# Patient Record
Sex: Male | Born: 1990 | Race: Black or African American | Hispanic: No | Marital: Single | Smoking: Former smoker
Health system: Southern US, Community
[De-identification: ages and names within clinical notes are randomized; demographics above are authoritative.]

## PROBLEM LIST (undated history)

## (undated) DIAGNOSIS — K219 Gastro-esophageal reflux disease without esophagitis: Secondary | ICD-10-CM

## (undated) DIAGNOSIS — M810 Age-related osteoporosis without current pathological fracture: Secondary | ICD-10-CM

## (undated) DIAGNOSIS — R011 Cardiac murmur, unspecified: Secondary | ICD-10-CM

## (undated) HISTORY — DX: Gastro-esophageal reflux disease without esophagitis: K21.9

## (undated) HISTORY — DX: Age-related osteoporosis without current pathological fracture: M81.0

## (undated) HISTORY — DX: Cardiac murmur, unspecified: R01.1

---
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
## 2009-05-23 ENCOUNTER — Emergency Department (HOSPITAL_COMMUNITY): Admission: EM | Admit: 2009-05-23 | Discharge: 2009-05-26 | Payer: Self-pay | Admitting: Emergency Medicine

## 2010-05-02 ENCOUNTER — Emergency Department (HOSPITAL_COMMUNITY): Admission: EM | Admit: 2010-05-02 | Discharge: 2010-05-02 | Payer: Self-pay | Admitting: Emergency Medicine

## 2010-11-26 ENCOUNTER — Emergency Department (HOSPITAL_COMMUNITY)
Admission: EM | Admit: 2010-11-26 | Discharge: 2010-11-26 | Payer: Self-pay | Source: Home / Self Care | Admitting: Emergency Medicine

## 2011-03-06 LAB — DIFFERENTIAL
Eosinophils Absolute: 0 10*3/uL (ref 0.0–0.7)
Eosinophils Relative: 0 % (ref 0–5)
Lymphocytes Relative: 12 % (ref 12–46)
Monocytes Absolute: 0.4 10*3/uL (ref 0.1–1.0)

## 2011-03-06 LAB — COMPREHENSIVE METABOLIC PANEL
ALT: 15 U/L (ref 0–53)
AST: 19 U/L (ref 0–37)
Albumin: 4.5 g/dL (ref 3.5–5.2)
BUN: 13 mg/dL (ref 6–23)
Calcium: 9.7 mg/dL (ref 8.4–10.5)
Chloride: 105 mEq/L (ref 96–112)
Creatinine, Ser: 1.16 mg/dL (ref 0.4–1.5)
GFR calc Af Amer: >60 mL/min (ref >60)
GFR calc non Af Amer: >60 mL/min (ref >60)
Total Protein: 7.8 g/dL (ref 6.0–8.3)

## 2011-03-06 LAB — RAPID URINE DRUG SCREEN, HOSP PERFORMED
Amphetamines: NOT DETECTED
Benzodiazepines: NOT DETECTED
Cocaine: NOT DETECTED
Opiates: NOT DETECTED
Tetrahydrocannabinol: POSITIVE — AB

## 2011-03-06 LAB — CBC
HCT: 44.1 % (ref 39.0–52.0)
MCV: 75.9 fL — ABNORMAL LOW (ref 78.0–100.0)
Platelets: 245 10*3/uL (ref 150–400)
RBC: 5.81 MIL/uL (ref 4.22–5.81)

## 2011-04-14 NOTE — Consult Note (Signed)
[name], IM             ACCOUNT NO.:  [id number]   MEDICAL RECORD NO.:  [id number]          PATIENT TYPE:  EMS   LOCATION:  ED                           FACILITY:  [facility]   PHYSICIAN:  Antonietta Breach, M.D.  DATE OF BIRTH:  1991-11-11   DATE OF CONSULTATION:  05/30/2009  DATE OF DISCHARGE:  05/26/2009                                 CONSULTATION   REASON FOR CONSULTATION:  Psychosis.   REQUESTING PHYSICIAN:  [facility] emergency physician.   HISTORY OF PRESENT ILLNESS:  [name] is an 20 year old male  presenting to the [facility] with auditory hallucinations.  He  has been having auditory hallucinations telling him to do bad things.  He also has had homicidal thoughts towards his son and his mother.  He  has had suicidal thoughts as well.   PAST PSYCHIATRIC HISTORY:  He has been experiencing episodes of  psychotic symptoms since he was very young.   FAMILY PSYCHIATRIC HISTORY:  None known.   SOCIAL HISTORY:  He does have a newborn son. He is single.  He is not  drinking alcohol.   PAST MEDICAL HISTORY:  Usual childhood illnesses.   ALLERGIES:  NO KNOWN DRUG ALLERGIES.   MEDICATIONS:  He has not been having any home medications.   While in the emergency room he has required Ativan for agitation.  Specifically he has required 6 mg of Ativan over the past 2 days.   LABORATORY DATA:  Urine drug screen positive for tetrahydrocannabinol.   Alcohol level negative.   Sodium 141, BUN 13, creatinine 1.16, glucose 84, SGOT 19, SGPT 15.   WBC 7.6, hemoglobin 14, platelet count 245.   REVIEW OF SYSTEMS:  Constitutional, head, eyes, ears, nose and throat,  mouth, neurologic, psychiatric, cardiovascular, respiratory,  gastrointestinal, genitourinary, skin, musculoskeletal, hematologic,  lymphatic, endocrine, and metabolic are all unremarkable.   PHYSICAL EXAMINATION:  VITAL SIGNS:  Temperature 98.4, pulse 57,  respiratory rate 20, blood pressure  117/69.  GENERAL APPEARANCE:  [name] is a young male lying in a supine  position in his hospital bed with no abnormal involuntary movements.   MENTAL STATUS EXAM:  [name] is alert.  His eye contact is  intermittent.  His attention span is mildly decreased.  His affect is  anxious. Mood is anxious.  Concentration mildly decreased.  He is  oriented to all spheres.  His memory is grossly intact.  His fund of  knowledge and intelligence are grossly within normal limits.  His speech  is soft.  There is no dysarthria.  Thought process is coherent. Thought  content, please see the history of present illness.  Insight is poor.  Judgment is impaired.   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   [name] does require psychiatric hospitalization given the severity  of his psychosis and the potential risk of harming himself and others.   Regarding his pharmacotherapy, recommend starting Seroquel at 50 mg p.o.  q.h.s. Would then titrate by 50 mg per day as tolerated to an estimated  trial dose of 300 mg daily.  Would monitor for stiffness or other extrapyramidal side effects.   Low stimulation ego support.   Admit to an inpatient psychiatric unit as soon as possible.      Antonietta Breach, M.D.  Electronically Signed     JW/MEDQ  D:  05/29/2009  T:  05/30/2009  Job:  [id number]

## 2011-10-02 ENCOUNTER — Encounter: Payer: Self-pay | Admitting: Emergency Medicine

## 2011-10-02 ENCOUNTER — Emergency Department (HOSPITAL_COMMUNITY)
Admission: EM | Admit: 2011-10-02 | Discharge: 2011-10-02 | Disposition: A | Discharge status: Home / Self Care | Payer: Self-pay | Attending: Emergency Medicine | Admitting: Emergency Medicine

## 2011-10-02 ENCOUNTER — Emergency Department (HOSPITAL_COMMUNITY)
Admission: EM | Admit: 2011-10-02 | Discharge: 2011-10-02 | Discharge status: Absent without leave | Payer: Self-pay | Source: Home / Self Care

## 2011-10-02 DIAGNOSIS — S81009A Unspecified open wound, unspecified knee, initial encounter: Secondary | ICD-10-CM | POA: Insufficient documentation

## 2011-10-02 DIAGNOSIS — S91009A Unspecified open wound, unspecified ankle, initial encounter: Secondary | ICD-10-CM | POA: Insufficient documentation

## 2011-10-02 DIAGNOSIS — F17200 Nicotine dependence, unspecified, uncomplicated: Secondary | ICD-10-CM | POA: Insufficient documentation

## 2011-10-02 DIAGNOSIS — W540XXA Bitten by dog, initial encounter: Secondary | ICD-10-CM | POA: Insufficient documentation

## 2011-10-02 DIAGNOSIS — S81859A Open bite, unspecified lower leg, initial encounter: Secondary | ICD-10-CM

## 2011-10-02 MED ORDER — TETANUS-DIPHTH-ACELL PERTUSSIS 5-2.5-18.5 LF-MCG/0.5 IM SUSP: 0.5000 mL | Freq: Once | INTRAMUSCULAR | Status: DC

## 2011-10-02 NOTE — ED Provider Notes (Signed)
History     CSN: [id number] Arrival date & time: 10/02/2011  3:30 PM   First MD Initiated Contact with Patient 10/02/11 1616      Chief Complaint  Patient presents with  . Animal Bite    (Consider location/radiation/quality/duration/timing/severity/associated sxs/prior treatment) HPI This 20 year old male was bitten on the right posterior calf 3 days ago at his girlfriend's dog which is a typical and was acting aggressively, animal control was notified and took the dog, the patient was notified today that animal control had euthanized the dog. The patient has continued pain to the area of his right lower leg were the 2 puncture marks are located but there is no redness or pus or swelling to the area. He also has no new numbness or obvious weakness to the right leg.  He has no fever dizziness chest pain shortness of breath or other concerns. He thinks he might need a tetanus shot. The pain is sharp and localized to the right lower leg without radiation or associated symptoms. History reviewed. No pertinent past medical history.  History reviewed. No pertinent past surgical history.  History reviewed. No pertinent family history.  History  Substance Use Topics  . Smoking status: Current Everyday Smoker -- 0.5 packs/day for 5 years  . Smokeless tobacco: Not on file  . Alcohol Use: Yes      Review of Systems  Constitutional: Negative for fever.       10 Systems reviewed and are negative for acute change except as noted in the HPI.  HENT: Negative for congestion.   Eyes: Negative for discharge and redness.  Respiratory: Negative for cough and shortness of breath.   Cardiovascular: Negative for chest pain.  Gastrointestinal: Negative for vomiting and abdominal pain.  Musculoskeletal: Negative for back pain.  Skin: Negative for rash.  Neurological: Negative for syncope, numbness and headaches.  Psychiatric/Behavioral:       No behavior change.    Allergies  Review of patient's  allergies indicates no known allergies.  Home Medications  No current outpatient prescriptions on file.  BP 126/72  Pulse 68  Temp(Src) 98 F (36.7 C) (Oral)  Resp 16  SpO2 96%  Physical Exam  Nursing note and vitals reviewed. Constitutional:       Awake, alert, nontoxic appearance.  HENT:  Head: Atraumatic.  Eyes: Right eye exhibits no discharge. Left eye exhibits no discharge.  Neck: Neck supple.  Cardiovascular: Normal rate and regular rhythm.   No murmur heard. Pulmonary/Chest: Effort normal and breath sounds normal. No respiratory distress. He has no wheezes. He has no rales. He exhibits no tenderness.  Abdominal: Soft. Bowel sounds are normal. There is no tenderness. There is no rebound and no guarding.  Musculoskeletal: Normal range of motion. He exhibits tenderness. He exhibits no edema.       Baseline ROM, no obvious new focal weakness.  Both arms and left leg are totally nontender. The right leg is nontender to the thigh ankle and foot with dorsalis pedis pulse intact capillary refill less than 2 seconds normal light touch and good range of motion to the foot and right leg with good strength to the right leg. The right lower leg in the calf area proximally two scabbed over puncture wounds which are tender locally to palpation without erythema fluctuance purulent drainage foul odor or crepitus.  There is no swelling to the lower leg and the lower leg is soft without suspicion of compartment syndrome at this time.  Neurological:  Mental status and motor strength appears baseline for patient and situation.  Skin: No rash noted.  Psychiatric: He has a normal mood and affect.    ED Course  Procedures (including critical care time) Nursing spoke with animal control and animal control states they are quarantined the animal and have not euthanized the animal yet. As expected animal control recommends no rabies prophylaxis at this time. At this time I doubt localized  infection, compartment syndrome, or other condition requiring further dilation at this time today. Labs Reviewed - No data to display No results found.   1. Dog bite of calf       MDM  Patient / Family / Caregiver informed of clinical course, understand medical decision-making process, and agree with plan.        Hurman Horn, MD 10/04/11 2233

## 2011-10-02 NOTE — ED Notes (Signed)
Pt here after being bite by dog on Friday; pt sts animal control has animal; pt c/o pain a wound site; 2 puncture wounds noted to back of right calf

## 2011-12-14 ENCOUNTER — Emergency Department (HOSPITAL_COMMUNITY): Payer: Self-pay

## 2011-12-14 ENCOUNTER — Emergency Department (HOSPITAL_COMMUNITY)
Admission: EM | Admit: 2011-12-14 | Discharge: 2011-12-15 | Disposition: A | Discharge status: Home / Self Care | Payer: Self-pay | Attending: Emergency Medicine | Admitting: Emergency Medicine

## 2011-12-14 ENCOUNTER — Encounter (HOSPITAL_COMMUNITY): Payer: Self-pay | Admitting: Emergency Medicine

## 2011-12-14 DIAGNOSIS — M5380 Other specified dorsopathies, site unspecified: Secondary | ICD-10-CM | POA: Insufficient documentation

## 2011-12-14 DIAGNOSIS — F17200 Nicotine dependence, unspecified, uncomplicated: Secondary | ICD-10-CM | POA: Insufficient documentation

## 2011-12-14 DIAGNOSIS — M549 Dorsalgia, unspecified: Secondary | ICD-10-CM | POA: Insufficient documentation

## 2011-12-14 LAB — URINALYSIS, ROUTINE W REFLEX MICROSCOPIC
Bilirubin Urine: NEGATIVE
Glucose, UA: NEGATIVE mg/dL
Leukocytes, UA: NEGATIVE
Nitrite: NEGATIVE
Urobilinogen, UA: 0.2 mg/dL (ref 0.0–1.0)

## 2011-12-14 MED ORDER — IBUPROFEN 800 MG PO TABS: 800.0000 mg | ORAL_TABLET | Freq: Three times a day (TID) | ORAL | Status: AC

## 2011-12-14 MED ORDER — IBUPROFEN 800 MG PO TABS: 800.0000 mg | ORAL_TABLET | Freq: Three times a day (TID) | ORAL | Status: DC

## 2011-12-14 MED ORDER — HYDROMORPHONE HCL PF 1 MG/ML IJ SOLN
1.0000 mg | Freq: Once | INTRAMUSCULAR | Status: AC
  Administered 2011-12-14: 1 mg via INTRAMUSCULAR
  Filled 2011-12-14: qty 1

## 2011-12-14 MED ORDER — DIAZEPAM 5 MG PO TABS: 5.0000 mg | ORAL_TABLET | Freq: Three times a day (TID) | ORAL | Status: AC | PRN

## 2011-12-14 MED ORDER — DIAZEPAM 5 MG PO TABS
10.0000 mg | ORAL_TABLET | Freq: Once | ORAL | Status: AC
  Administered 2011-12-14: 10 mg via ORAL
  Filled 2011-12-14: qty 2

## 2011-12-14 NOTE — ED Notes (Signed)
Pt reports left mid back pain that radiates to rt - pt states pain began acutely this a.m. - pt denies any mechanism of injury or hematuria. Pt w/o any n/v/d or fever.

## 2011-12-14 NOTE — ED Notes (Signed)
Per EMS pt c/o lower back pain, started in groin and moved to lower back. Pt denies injury.

## 2011-12-14 NOTE — ED Provider Notes (Signed)
History     CSN: [id number]  Arrival date & time 12/14/11  1955   First MD Initiated Contact with Patient 12/14/11 2104      Chief Complaint  Patient presents with  . Back Pain    (Consider location/radiation/quality/duration/timing/severity/associated sxs/prior treatment) Patient is a 21 y.o. male presenting with back pain. The history is provided by the patient.  Back Pain  This is a new problem. Episode onset: just prior to arrival. The problem occurs constantly. The problem has not changed since onset.The pain is associated with no known injury (began while standing). The pain is present in the thoracic spine and lumbar spine. The quality of the pain is described as cramping and stabbing. The pain does not radiate. The pain is severe. The symptoms are aggravated by bending and twisting. The pain is the same all the time. Pertinent negatives include no chest pain, no fever, no numbness, no headaches, no abdominal pain, no abdominal swelling, no bowel incontinence, no perianal numbness, no bladder incontinence, no dysuria, no pelvic pain, no leg pain, no paresthesias, no paresis, no tingling and no weakness. He has tried nothing for the symptoms.  Although pt reports pain initially in testicles, denies current pain in groin or testicles.  History reviewed. No pertinent past medical history.  History reviewed. No pertinent past surgical history.  No family history on file.  History  Substance Use Topics  . Smoking status: Current Everyday Smoker -- 0.5 packs/day for 5 years  . Smokeless tobacco: Not on file  . Alcohol Use: Yes      Review of Systems  Constitutional: Negative for fever and chills.  Cardiovascular: Negative for chest pain.  Gastrointestinal: Negative for abdominal pain and bowel incontinence.  Genitourinary: Negative for bladder incontinence, dysuria and pelvic pain.  Musculoskeletal: Positive for back pain.  Neurological: Negative for tingling, weakness,  numbness, headaches and paresthesias.  All other systems reviewed and are negative.    Allergies  Review of patient's allergies indicates no known allergies.  Home Medications   Current Outpatient Rx  Name Route Sig Dispense Refill  . ASPIRIN 325 MG PO TABS Oral Take 325 mg by mouth daily. Took 2 tablets      BP 109/47  Pulse 64  Temp(Src) 98.8 F (37.1 C) (Oral)  Resp 20  Wt 140 lb (63.504 kg)  SpO2 97%  Physical Exam  Nursing note and vitals reviewed. Constitutional: He is oriented to person, place, and time. He appears well-developed and well-nourished. He appears distressed.  HENT:  Head: Normocephalic and atraumatic.  Right Ear: External ear normal.  Left Ear: External ear normal.  Eyes: Conjunctivae and EOM are normal. Pupils are equal, round, and reactive to light.  Neck: Normal range of motion. Neck supple.  Cardiovascular: Normal rate, regular rhythm, normal heart sounds and intact distal pulses.   No murmur heard. Pulmonary/Chest: Effort normal and breath sounds normal. No respiratory distress. He has no wheezes. He exhibits no tenderness.  Abdominal: Soft. Bowel sounds are normal. He exhibits no distension. There is no tenderness. Hernia confirmed negative in the right inguinal area and confirmed negative in the left inguinal area.  Genitourinary: Penis normal. Right testis shows no mass, no swelling and no tenderness. Left testis shows no mass, no swelling and no tenderness. Circumcised. No penile erythema or penile tenderness. No discharge found.  Musculoskeletal: He exhibits no edema.       Cervical back: He exhibits normal range of motion, no tenderness, no bony tenderness, no deformity  and no pain.       Thoracic back: He exhibits bony tenderness and spasm. He exhibits normal range of motion and no deformity.       Lumbar back: He exhibits spasm. He exhibits normal range of motion, no bony tenderness and no deformity.  Lymphadenopathy:    He has no cervical  adenopathy.       Right: No inguinal adenopathy present.       Left: No inguinal adenopathy present.  Neurological: He is alert and oriented to person, place, and time. No cranial nerve deficit. Coordination normal.       Sensation intact to light touch  Skin: Skin is warm and dry. No rash noted. No erythema.    ED Course  Procedures (including critical care time)  Labs Reviewed  URINALYSIS, ROUTINE W REFLEX MICROSCOPIC - Abnormal; Notable for the following:    Ketones, ur TRACE (*)    All other components within normal limits   Dg Thoracic Spine 2 View  12/14/2011  *RADIOLOGY REPORT*  Clinical Data: Mid back pain.  THORACIC SPINE - 2 VIEW  Comparison: Chest radiograph performed 12/16/2006  Findings: There is no evidence of fracture or subluxation. Vertebral bodies demonstrate normal height and alignment. Intervertebral disc spaces are preserved.  The visualized portions of both lungs are clear.  The mediastinum is unremarkable in appearance.  IMPRESSION: No evidence of fracture or subluxation along the thoracic spine.  Original Report Authenticated By: Tonia Ghent, M.D.     Dx 1: Back pain   MDM  Acute spasm of thoracic and lumbar regions of back with bony TTP to thoracic spine, x-ray reviewed with no evidence of fx or subluxation. Complete resolution of symptoms after 1mg  IM dilaudid and 10mg  PO valium. Pt to be d/c home.        Elwyn Reach [place], [place] 12/14/11 2356

## 2011-12-14 NOTE — ED Notes (Signed)
Pt alert, nad, c/o low back pain, onset this evening, denies trauma or injury, ambulates with assistance, resp even unlabored, denies changes in bowel or bladder

## 2011-12-15 MED ORDER — ONDANSETRON 8 MG PO TBDP
8.0000 mg | ORAL_TABLET | Freq: Once | ORAL | Status: AC
  Administered 2011-12-15: 8 mg via ORAL

## 2011-12-15 MED ORDER — ONDANSETRON 8 MG PO TBDP
ORAL_TABLET | ORAL | Status: AC
  Filled 2011-12-15: qty 1

## 2011-12-15 MED ORDER — ONDANSETRON 4 MG PO TBDP
ORAL_TABLET | ORAL | Status: AC
  Filled 2011-12-15: qty 1

## 2011-12-15 NOTE — ED Notes (Signed)
Rx given x2 D/c instructions reviewed w/ pt and family - pt and family deny any further questions or concerns at present.  

## 2011-12-18 NOTE — ED Provider Notes (Signed)
Medical screening examination/treatment/procedure(s) were performed by non-physician practitioner and as supervising physician I was immediately available for consultation/collaboration.  Shateria Paternostro R. Lyndie Vanderloop, MD 12/18/11 1452 

## 2012-09-07 ENCOUNTER — Emergency Department (HOSPITAL_COMMUNITY)
Admission: EM | Admit: 2012-09-07 | Discharge: 2012-09-07 | Disposition: A | Discharge status: Home / Self Care | Payer: Self-pay | Attending: Emergency Medicine | Admitting: Emergency Medicine

## 2012-09-07 ENCOUNTER — Encounter (HOSPITAL_COMMUNITY): Payer: Self-pay | Admitting: *Deleted

## 2012-09-07 DIAGNOSIS — K029 Dental caries, unspecified: Secondary | ICD-10-CM | POA: Insufficient documentation

## 2012-09-07 DIAGNOSIS — K0530 Chronic periodontitis, unspecified: Secondary | ICD-10-CM | POA: Insufficient documentation

## 2012-09-07 DIAGNOSIS — K047 Periapical abscess without sinus: Secondary | ICD-10-CM | POA: Insufficient documentation

## 2012-09-07 DIAGNOSIS — F17200 Nicotine dependence, unspecified, uncomplicated: Secondary | ICD-10-CM | POA: Insufficient documentation

## 2012-09-07 DIAGNOSIS — K0510 Chronic gingivitis, plaque induced: Secondary | ICD-10-CM | POA: Insufficient documentation

## 2012-09-07 MED ORDER — METRONIDAZOLE 500 MG PO TABS
500.0000 mg | ORAL_TABLET | Freq: Once | ORAL | Status: AC
  Administered 2012-09-07: 500 mg via ORAL
  Filled 2012-09-07: qty 1

## 2012-09-07 MED ORDER — HYDROCODONE-ACETAMINOPHEN 5-325 MG PO TABS
2.0000 | ORAL_TABLET | Freq: Once | ORAL | Status: AC
  Administered 2012-09-07: 2 via ORAL
  Filled 2012-09-07: qty 2

## 2012-09-07 MED ORDER — IBUPROFEN 400 MG PO TABS
600.0000 mg | ORAL_TABLET | Freq: Once | ORAL | Status: AC
  Administered 2012-09-07: 600 mg via ORAL
  Filled 2012-09-07: qty 1

## 2012-09-07 MED ORDER — IBUPROFEN 600 MG PO TABS: 600.0000 mg | ORAL_TABLET | Freq: Four times a day (QID) | ORAL | Status: DC | PRN

## 2012-09-07 MED ORDER — METRONIDAZOLE 500 MG PO TABS: 500.0000 mg | ORAL_TABLET | Freq: Two times a day (BID) | ORAL | Status: DC

## 2012-09-07 MED ORDER — PENICILLIN V POTASSIUM 500 MG PO TABS: 500.0000 mg | ORAL_TABLET | Freq: Four times a day (QID) | ORAL | Status: AC

## 2012-09-07 MED ORDER — PENICILLIN V POTASSIUM 250 MG PO TABS
250.0000 mg | ORAL_TABLET | Freq: Once | ORAL | Status: AC
  Administered 2012-09-07: 250 mg via ORAL
  Filled 2012-09-07: qty 1

## 2012-09-07 MED ORDER — HYDROCODONE-ACETAMINOPHEN 5-325 MG PO TABS: 1.0000 | ORAL_TABLET | Freq: Four times a day (QID) | ORAL | Status: DC | PRN

## 2012-09-07 NOTE — ED Notes (Signed)
Patient woke with swelling in his face/jaw on the left side.  He states he has some pain on yesterday.  Patient states the swelling is causing him diff swallowing and breathing due to swelling

## 2012-09-07 NOTE — ED Provider Notes (Addendum)
History    This chart was scribed for Derwood Kaplan, MD, MD by Smitty Pluck. The patient was seen in room TR11C and the patient's care was started at 11:31AM.   CSN: [id number]  Arrival date & time 09/07/12  1000      Chief Complaint  Patient presents with  . Facial Swelling    (Consider location/radiation/quality/duration/timing/severity/associated sxs/prior treatment) The history is provided by the patient. No language interpreter was used.   [name] is a 21 y.o. male who presents to the Emergency Department complaining of moderate left jaw swelling onset today and constant, moderate left jaw pain onset 3 days ago. Pt thinks that he has abscess. Reports pain started in lower left tooth and spread to jaw. He reports that pain is aggravated by chewing. Reports that he has chills. Denies nausea, vomiting, fever and any other pain.   History reviewed. No pertinent past medical history.  History reviewed. No pertinent past surgical history.  No family history on file.  History  Substance Use Topics  . Smoking status: Current Every Day Smoker -- 0.5 packs/day for 5 years  . Smokeless tobacco: Not on file  . Alcohol Use: Yes      Review of Systems  Constitutional: Positive for chills.  HENT: Positive for dental problem.   All other systems reviewed and are negative.    Allergies  Review of patient's allergies indicates no known allergies.  Home Medications  No current outpatient prescriptions on file.  BP 127/73  Pulse 74  Temp 97.3 F (36.3 C) (Oral)  Resp 20  Ht 5\' 11"  (1.803 m)  Wt 167 lb (75.751 kg)  BMI 23.29 kg/m2  SpO2 100%  Physical Exam  Nursing note and vitals reviewed. Constitutional: He is oriented to person, place, and time. He appears well-developed and well-nourished.  HENT:  Head: Normocephalic and atraumatic.       Base of tongue and mouth are nl No swelling  No erythema Lateral tongue is nl Tooth 1-8 no pain with  palpation Tooth 16-32 no pain with palpation All other teeth have tenderness Tooth 18 has erosion of enamel  There appears to be yellow discharge No visible pulp exposure  Gingival swelling mostly on mucosal surface on left lower side No tenderness or swelling of left upper teeth  Negative trismus  External left face has mild swelling no erythema Left face has moderate tenderness with palpation no fluctuance nor crepitus   Neck: Normal range of motion. Neck supple.  Cardiovascular: Normal rate, regular rhythm and normal heart sounds.   No murmur heard. Pulmonary/Chest: Effort normal and breath sounds normal. No respiratory distress. He has no wheezes. He has no rales.  Neurological: He is alert and oriented to person, place, and time.  Skin: Skin is warm and dry.  Psychiatric: He has a normal mood and affect. His behavior is normal.    ED Course  Procedures (including critical care time) DIAGNOSTIC STUDIES: Oxygen Saturation is 100% on room air, normal by my interpretation.    COORDINATION OF CARE: 11:39 AM Discussed ED treatment with pt     Labs Reviewed - No data to display No results found.   No diagnosis found.    MDM  Medical screening examination/treatment/procedure(s) were performed by me as the supervising physician. Scribe service was utilized for documentation only.  \DDx includes: - Periapical tooth infection - Dental abscess - Gingivitis - Dental trauma - Pulpitis - Nerve root compression  Pt comes in with what appears  to be periodontitis. He has no insurance, and states wont be able to afford Augmentin, so we will d.c him w. Pen v k and flagyl. No systemic signs, no evidence of abscess, and no comorbidities.     Derwood Kaplan, MD 09/07/12 1610  Derwood Kaplan, MD 09/07/12 1217

## 2012-11-12 ENCOUNTER — Encounter (HOSPITAL_COMMUNITY): Payer: Self-pay | Admitting: *Deleted

## 2012-11-12 ENCOUNTER — Emergency Department (HOSPITAL_COMMUNITY)
Admission: EM | Admit: 2012-11-12 | Discharge: 2012-11-12 | Disposition: A | Discharge status: Home / Self Care | Payer: Self-pay | Attending: Emergency Medicine | Admitting: Emergency Medicine

## 2012-11-12 ENCOUNTER — Emergency Department (HOSPITAL_COMMUNITY): Payer: Self-pay

## 2012-11-12 DIAGNOSIS — S60221A Contusion of right hand, initial encounter: Secondary | ICD-10-CM

## 2012-11-12 DIAGNOSIS — Y929 Unspecified place or not applicable: Secondary | ICD-10-CM | POA: Insufficient documentation

## 2012-11-12 DIAGNOSIS — S60229A Contusion of unspecified hand, initial encounter: Secondary | ICD-10-CM | POA: Insufficient documentation

## 2012-11-12 DIAGNOSIS — F17200 Nicotine dependence, unspecified, uncomplicated: Secondary | ICD-10-CM | POA: Insufficient documentation

## 2012-11-12 DIAGNOSIS — IMO0002 Reserved for concepts with insufficient information to code with codable children: Secondary | ICD-10-CM | POA: Insufficient documentation

## 2012-11-12 DIAGNOSIS — Y9389 Activity, other specified: Secondary | ICD-10-CM | POA: Insufficient documentation

## 2012-11-12 DIAGNOSIS — R209 Unspecified disturbances of skin sensation: Secondary | ICD-10-CM | POA: Insufficient documentation

## 2012-11-12 DIAGNOSIS — Z23 Encounter for immunization: Secondary | ICD-10-CM | POA: Insufficient documentation

## 2012-11-12 DIAGNOSIS — S51839A Puncture wound without foreign body of unspecified forearm, initial encounter: Secondary | ICD-10-CM

## 2012-11-12 DIAGNOSIS — S51809A Unspecified open wound of unspecified forearm, initial encounter: Secondary | ICD-10-CM | POA: Insufficient documentation

## 2012-11-12 DIAGNOSIS — R0789 Other chest pain: Secondary | ICD-10-CM | POA: Insufficient documentation

## 2012-11-12 MED ORDER — HYDROCODONE-ACETAMINOPHEN 5-325 MG PO TABS
1.0000 | ORAL_TABLET | Freq: Once | ORAL | Status: AC
  Administered 2012-11-12: 1 via ORAL
  Filled 2012-11-12: qty 1

## 2012-11-12 MED ORDER — TETANUS-DIPHTH-ACELL PERTUSSIS 5-2.5-18.5 LF-MCG/0.5 IM SUSP
0.5000 mL | Freq: Once | INTRAMUSCULAR | Status: AC
  Administered 2012-11-12: 0.5 mL via INTRAMUSCULAR
  Filled 2012-11-12: qty 0.5
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
## 2012-11-12 NOTE — ED Provider Notes (Signed)
Medical screening examination/treatment/procedure(s) were performed by non-physician practitioner and as supervising physician I was immediately available for consultation/collaboration. Devoria Albe, MD, Armando Gang   Ward Givens, MD 11/12/12 [phone]

## 2012-11-12 NOTE — Progress Notes (Signed)
Orthopedic Tech Progress Note Patient Details:  [name] March 22, 1991 [id number]  Ortho Devices Type of Ortho Device: Velcro wrist splint Ortho Device/Splint Location: (R) UE Ortho Device/Splint Interventions: Application   Jennye Moccasin 11/12/2012, 10:46 PM

## 2013-12-06 ENCOUNTER — Emergency Department: Payer: Self-pay | Admitting: Emergency Medicine

## 2018-11-25 ENCOUNTER — Other Ambulatory Visit: Payer: Self-pay

## 2018-11-25 ENCOUNTER — Encounter (HOSPITAL_COMMUNITY): Payer: Self-pay | Admitting: Emergency Medicine

## 2018-11-25 ENCOUNTER — Emergency Department (HOSPITAL_COMMUNITY)
Admission: EM | Admit: 2018-11-25 | Discharge: 2018-11-25 | Disposition: A | Discharge status: Home / Self Care | Payer: Self-pay | Attending: Emergency Medicine | Admitting: Emergency Medicine

## 2018-11-25 DIAGNOSIS — F17210 Nicotine dependence, cigarettes, uncomplicated: Secondary | ICD-10-CM | POA: Insufficient documentation

## 2018-11-25 DIAGNOSIS — J020 Streptococcal pharyngitis: Secondary | ICD-10-CM | POA: Insufficient documentation

## 2018-11-25 LAB — GROUP A STREP BY PCR: GROUP A STREP BY PCR: DETECTED — AB

## 2018-11-25 MED ORDER — PENICILLIN G BENZATHINE & PROC 1200000 UNIT/2ML IM SUSP
1.2000 10*6.[IU] | Freq: Once | INTRAMUSCULAR | Status: DC
  Filled 2018-11-25: qty 2

## 2018-11-25 MED ORDER — PENICILLIN G BENZATHINE 1200000 UNIT/2ML IM SUSP
1.2000 10*6.[IU] | Freq: Once | INTRAMUSCULAR | Status: AC
  Administered 2018-11-25: 1.2 10*6.[IU] via INTRAMUSCULAR
  Filled 2018-11-25: qty 2

## 2018-11-25 MED ORDER — DEXAMETHASONE SODIUM PHOSPHATE 10 MG/ML IJ SOLN
10.0000 mg | Freq: Once | INTRAMUSCULAR | Status: AC
  Administered 2018-11-25: 10 mg via INTRAMUSCULAR
  Filled 2018-11-25: qty 1

## 2018-11-25 MED ORDER — LIDOCAINE VISCOUS HCL 2 % MT SOLN: 15.0000 mL | OROMUCOSAL | 0 refills | Status: DC | PRN

## 2018-11-25 NOTE — Discharge Instructions (Signed)
Return to ED for worsening symptoms, trouble breathing or trouble swallowing, vomiting or coughing up blood. Swish and spit lidocaine solution to help with throat discomfort.  Do not swallow.

## 2018-11-25 NOTE — ED Provider Notes (Signed)
[facility]   CSN: [id number] Arrival date & time: 11/25/18  [id number]     History   Chief Complaint Chief Complaint  Patient presents with  . Sore Throat    HPI [name] is a 27 y.o. male who presents to ED for 1 week history of sore throat.  States that he had influenza-like illness last week including sinus pain and pressure, nasal congestion, cough which gradually improved.  However, he reports pain in his throat worse with swallowing and eating.  He has not tried any medications to help with his symptoms.  Sick contacts at home with similar symptoms.  Denies any trouble breathing, trouble swallowing, trismus, drooling, shortness of breath.  HPI  History reviewed. No pertinent past medical history.  There are no active problems to display for this patient.   History reviewed. No pertinent surgical history.      Home Medications    Prior to Admission medications   Medication Sig Start Date End Date Taking? Authorizing Provider  lidocaine (XYLOCAINE) 2 % solution Use as directed 15 mLs in the mouth or throat as needed for mouth pain. 11/25/18   Dietrich PatesKhatri, Umer Harig, PA-C    Family History No family history on file.  Social History Social History   Tobacco Use  . Smoking status: Current Some Day Smoker    Packs/day: 0.50    Years: 5.00    Pack years: 2.50  . Smokeless tobacco: Never Used  Substance Use Topics  . Alcohol use: Yes  . Drug use: No     Allergies   Patient has no known allergies.   Review of Systems Review of Systems  Constitutional: Negative for chills and fever.  HENT: Positive for sore throat. Negative for rhinorrhea, sinus pressure, sinus pain and trouble swallowing.   Respiratory: Negative for cough.      Physical Exam Updated Vital Signs BP (!) 135/94 (BP Location: Right Arm)   Pulse 76   Temp 98.5 F (36.9 C) (Oral)   Resp 18   SpO2 97%   Physical Exam Vitals signs and  nursing note reviewed.  Constitutional:      General: He is not in acute distress.    Appearance: He is well-developed. He is not diaphoretic.  HENT:     Head: Normocephalic and atraumatic.     Right Ear: Tympanic membrane normal.     Left Ear: Tympanic membrane normal.     Nose: Nose normal.     Mouth/Throat:     Pharynx: Oropharynx is clear. Uvula midline. Posterior oropharyngeal erythema present. No oropharyngeal exudate.     Tonsils: No tonsillar exudate. Swelling: 2+ on the right. 2+ on the left.     Comments: Bilaterally, symmetrically enlarged tonsils without exudates.  Patient does not appear to be in acute distress. No trismus or drooling present. No pooling of secretions. Patient is tolerating secretions and is not in respiratory distress. No neck pain or tenderness to palpation of the neck. Full active and passive range of motion of the neck. No evidence of RPA or PTA. Eyes:     General: No scleral icterus.    Conjunctiva/sclera: Conjunctivae normal.  Neck:     Musculoskeletal: Normal range of motion.  Pulmonary:     Effort: Pulmonary effort is normal. No respiratory distress.  Skin:    Findings: No rash.  Neurological:     Mental Status: He is alert.      ED Treatments / Results  Labs (all labs ordered are listed, but only abnormal results are displayed) Labs Reviewed  GROUP A STREP BY PCR - Abnormal; Notable for the following components:      Result Value   Group A Strep by PCR DETECTED (*)    All other components within normal limits    EKG None  Radiology No results found.  Procedures Procedures (including critical care time)  Medications Ordered in ED Medications  dexamethasone (DECADRON) injection 10 mg (has no administration in time range)  penicillin g procaine-penicillin g benzathine (BICILLIN-CR) injection 600000-600000 units (has no administration in time range)     Initial Impression / Assessment and Plan / ED Course  I have reviewed the  triage vital signs and the nursing notes.  Pertinent labs & imaging results that were available during my care of the patient were reviewed by me and considered in my medical decision making (see chart for details).     Pt rapid strep test positive. Pt is tolerating secretions, not in respiratory distress, no neck pain, no trismus. Presentation not concerning for peritonsillar abscess or spread of infection to deep spaces of the throat; patent airway.  Patient given IM Decadron and penicillin.  Pt will be discharged with lidocaine to swish and spit. Ibuprofen or Tylenol as needed for pain/fever. Specific return precautions discussed. Recommended PCP follow up. Pt appears safe for discharge.   Patient is hemodynamically stable, in NAD, and able to ambulate in the ED. Evaluation does not show pathology that would require ongoing emergent intervention or inpatient treatment. I explained the diagnosis to the patient. Pain has been managed and has no complaints prior to discharge. Patient is comfortable with above plan and is stable for discharge at this time. All questions were answered prior to disposition. Strict return precautions for returning to the ED were discussed. Encouraged follow up with PCP.    Portions of this note were generated with Scientist, clinical (histocompatibility and immunogenetics)Dragon dictation software. Dictation errors may occur despite best attempts at proofreading.  Final Clinical Impressions(s) / ED Diagnoses   Final diagnoses:  Strep pharyngitis    ED Discharge Orders         Ordered    lidocaine (XYLOCAINE) 2 % solution  As needed     11/25/18 0714           Dietrich PatesKhatri, Kaylia Winborne, PA-C 11/25/18 [id number]    Vanetta MuldersZackowski, Scott, MD 11/25/18 [phone]

## 2018-11-25 NOTE — ED Triage Notes (Signed)
C/o sore throat and fever x 1 week.

## 2019-06-12 ENCOUNTER — Other Ambulatory Visit: Payer: Self-pay

## 2019-06-12 ENCOUNTER — Emergency Department (HOSPITAL_COMMUNITY)
Admission: EM | Admit: 2019-06-12 | Discharge: 2019-06-12 | Disposition: A | Discharge status: Home / Self Care | Payer: Self-pay | Attending: Emergency Medicine | Admitting: Emergency Medicine

## 2019-06-12 DIAGNOSIS — Y9289 Other specified places as the place of occurrence of the external cause: Secondary | ICD-10-CM | POA: Insufficient documentation

## 2019-06-12 DIAGNOSIS — W228XXA Striking against or struck by other objects, initial encounter: Secondary | ICD-10-CM | POA: Insufficient documentation

## 2019-06-12 DIAGNOSIS — Y9389 Activity, other specified: Secondary | ICD-10-CM | POA: Insufficient documentation

## 2019-06-12 DIAGNOSIS — S0990XA Unspecified injury of head, initial encounter: Secondary | ICD-10-CM

## 2019-06-12 DIAGNOSIS — S0101XA Laceration without foreign body of scalp, initial encounter: Secondary | ICD-10-CM | POA: Insufficient documentation

## 2019-06-12 DIAGNOSIS — Y998 Other external cause status: Secondary | ICD-10-CM | POA: Insufficient documentation

## 2019-06-12 MED ORDER — LIDOCAINE-EPINEPHRINE (PF) 2 %-1:200000 IJ SOLN
10.0000 mL | Freq: Once | INTRAMUSCULAR | Status: AC
  Administered 2019-06-12: 10 mL
  Filled 2019-06-12: qty 20

## 2019-06-12 NOTE — Discharge Instructions (Addendum)
Take Tylenol or ibuprofen as needed for headache. Use ice to help with pain and swelling. Wash your head once a day with soap and water, do not scrub vigorously over the cut, this will make it bleed. Follow-up at urgent care or in the ER in 1 week for staple removal. If you start bleeding, apply firm constant pressure for 20 minutes. If bleeding continues, return for further evaluation. Return if you develop fevers, pus draining from the site, severe headaches, vision changes, vomiting, difficulty walking, or any new, worsening, or concerning symptoms.
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
## 2019-06-21 ENCOUNTER — Ambulatory Visit (HOSPITAL_COMMUNITY)
Admission: EM | Admit: 2019-06-21 | Discharge: 2019-06-21 | Disposition: A | Discharge status: Home / Self Care | Payer: Self-pay | Attending: Internal Medicine | Admitting: Internal Medicine

## 2019-06-21 ENCOUNTER — Encounter (HOSPITAL_COMMUNITY): Payer: Self-pay | Admitting: Emergency Medicine

## 2019-06-21 ENCOUNTER — Other Ambulatory Visit: Payer: Self-pay

## 2019-06-21 DIAGNOSIS — Z4802 Encounter for removal of sutures: Secondary | ICD-10-CM

## 2019-06-21 NOTE — ED Triage Notes (Signed)
Staples placed on 7/16 in ED.  Patient here today for staple removal

## 2019-07-28 ENCOUNTER — Ambulatory Visit (HOSPITAL_COMMUNITY): Admission: RE | Admit: 2019-07-28 | Payer: Self-pay | Source: Ambulatory Visit

## 2019-07-28 ENCOUNTER — Encounter (HOSPITAL_COMMUNITY): Payer: Self-pay | Admitting: Emergency Medicine

## 2019-07-28 ENCOUNTER — Ambulatory Visit (HOSPITAL_COMMUNITY)
Admission: RE | Admit: 2019-07-28 | Discharge: 2019-07-28 | Disposition: A | Discharge status: Home / Self Care | Payer: Self-pay | Source: Ambulatory Visit | Attending: Physician Assistant | Admitting: Physician Assistant

## 2019-07-28 ENCOUNTER — Emergency Department (HOSPITAL_COMMUNITY)
Admission: EM | Admit: 2019-07-28 | Discharge: 2019-07-28 | Discharge status: Against Medical Advice | Payer: Self-pay | Attending: Emergency Medicine | Admitting: Emergency Medicine

## 2019-07-28 ENCOUNTER — Other Ambulatory Visit: Payer: Self-pay

## 2019-07-28 ENCOUNTER — Emergency Department (HOSPITAL_COMMUNITY): Payer: Self-pay

## 2019-07-28 DIAGNOSIS — R519 Headache, unspecified: Secondary | ICD-10-CM

## 2019-07-28 DIAGNOSIS — R1084 Generalized abdominal pain: Secondary | ICD-10-CM | POA: Diagnosis not present

## 2019-07-28 DIAGNOSIS — R51 Headache: Secondary | ICD-10-CM | POA: Diagnosis present

## 2019-07-28 DIAGNOSIS — F17210 Nicotine dependence, cigarettes, uncomplicated: Secondary | ICD-10-CM | POA: Insufficient documentation

## 2019-07-28 DIAGNOSIS — R0789 Other chest pain: Secondary | ICD-10-CM | POA: Insufficient documentation

## 2019-07-28 DIAGNOSIS — R42 Dizziness and giddiness: Secondary | ICD-10-CM | POA: Insufficient documentation

## 2019-07-28 LAB — BASIC METABOLIC PANEL
Anion gap: 8 (ref 5–15)
BUN: 15 mg/dL (ref 6–20)
CO2: 28 mmol/L (ref 22–32)
Calcium: 8.7 mg/dL — ABNORMAL LOW (ref 8.9–10.3)
Chloride: 104 mmol/L (ref 98–111)
Creatinine, Ser: 1.06 mg/dL (ref 0.61–1.24)
GFR calc Af Amer: >60 mL/min (ref >60)
GFR calc non Af Amer: >60 mL/min (ref >60)
Glucose, Bld: 85 mg/dL (ref 70–99)
Potassium: 4 mmol/L (ref 3.5–5.1)
Sodium: 140 mmol/L (ref 135–145)

## 2019-07-28 LAB — CBC
HCT: 41.4 % (ref 39.0–52.0)
Hemoglobin: 12.3 g/dL — ABNORMAL LOW (ref 13.0–17.0)
MCH: 23.6 pg — ABNORMAL LOW (ref 26.0–34.0)
MCHC: 29.7 g/dL — ABNORMAL LOW (ref 30.0–36.0)
MCV: 79.5 fL — ABNORMAL LOW (ref 80.0–100.0)
Platelets: 272 10*3/uL (ref 150–400)
RBC: 5.21 MIL/uL (ref 4.22–5.81)
RDW: 14 % (ref 11.5–15.5)
WBC: 5.4 10*3/uL (ref 4.0–10.5)
nRBC: 0 % (ref 0.0–0.2)

## 2019-07-28 MED ORDER — IOHEXOL 300 MG/ML  SOLN: 100.0000 mL | Freq: Once | INTRAMUSCULAR | Status: DC | PRN

## 2019-07-28 MED ORDER — NAPROXEN 500 MG PO TABS: 500.0000 mg | ORAL_TABLET | Freq: Two times a day (BID) | ORAL | 0 refills | Status: DC

## 2019-07-28 NOTE — ED Notes (Addendum)
Pt leaving AMA, pt given work note that pt was seen. Britni Henderly, PA aware pt leaving AMA.
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
## 2019-07-28 NOTE — Discharge Instructions (Signed)
Seek reevaluation if her symptoms worsen.

## 2019-07-28 NOTE — ED Notes (Signed)
ED Provider at bedside. 
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
## 2020-03-27 DIAGNOSIS — W3400XA Accidental discharge from unspecified firearms or gun, initial encounter: Secondary | ICD-10-CM

## 2020-03-27 DIAGNOSIS — N319 Neuromuscular dysfunction of bladder, unspecified: Secondary | ICD-10-CM

## 2020-03-27 DIAGNOSIS — G8220 Paraplegia, unspecified: Secondary | ICD-10-CM

## 2020-03-27 DIAGNOSIS — Y249XXA Unspecified firearm discharge, undetermined intent, initial encounter: Secondary | ICD-10-CM

## 2020-03-27 DIAGNOSIS — S24109A Unspecified injury at unspecified level of thoracic spinal cord, initial encounter: Secondary | ICD-10-CM

## 2020-03-27 DIAGNOSIS — N529 Male erectile dysfunction, unspecified: Secondary | ICD-10-CM

## 2020-03-27 DIAGNOSIS — L89151 Pressure ulcer of sacral region, stage 1: Secondary | ICD-10-CM

## 2020-03-27 DIAGNOSIS — K592 Neurogenic bowel, not elsewhere classified: Secondary | ICD-10-CM

## 2020-03-27 HISTORY — DX: Pressure ulcer of sacral region, stage 1: L89.151

## 2020-03-27 HISTORY — DX: Neuromuscular dysfunction of bladder, unspecified: N31.9

## 2020-03-27 HISTORY — DX: Male erectile dysfunction, unspecified: N52.9

## 2020-03-27 HISTORY — DX: Accidental discharge from unspecified firearms or gun, initial encounter: W34.00XA

## 2020-03-27 HISTORY — DX: Unspecified injury at unspecified level of thoracic spinal cord, initial encounter: S24.109A

## 2020-03-27 HISTORY — DX: Paraplegia, unspecified: G82.20

## 2020-03-27 HISTORY — DX: Unspecified firearm discharge, undetermined intent, initial encounter: Y24.9XXA

## 2020-03-27 HISTORY — DX: Neurogenic bowel, not elsewhere classified: K59.2

## 2020-03-30 ENCOUNTER — Emergency Department (HOSPITAL_COMMUNITY): Payer: Medicaid Other

## 2020-03-30 ENCOUNTER — Inpatient Hospital Stay (HOSPITAL_COMMUNITY): Payer: Medicaid Other

## 2020-03-30 ENCOUNTER — Inpatient Hospital Stay (HOSPITAL_COMMUNITY)
Admission: EM | Admit: 2020-03-30 | Discharge: 2020-04-16 | DRG: 913 | Disposition: A | Discharge status: Rehabilitation Facility | Payer: Medicaid Other | Attending: Physician Assistant | Admitting: Physician Assistant

## 2020-03-30 DIAGNOSIS — K592 Neurogenic bowel, not elsewhere classified: Secondary | ICD-10-CM | POA: Diagnosis present

## 2020-03-30 DIAGNOSIS — F4311 Post-traumatic stress disorder, acute: Secondary | ICD-10-CM

## 2020-03-30 DIAGNOSIS — J942 Hemothorax: Secondary | ICD-10-CM | POA: Diagnosis present

## 2020-03-30 DIAGNOSIS — F22 Delusional disorders: Secondary | ICD-10-CM | POA: Diagnosis not present

## 2020-03-30 DIAGNOSIS — S270XXA Traumatic pneumothorax, initial encounter: Secondary | ICD-10-CM | POA: Diagnosis present

## 2020-03-30 DIAGNOSIS — N179 Acute kidney failure, unspecified: Secondary | ICD-10-CM | POA: Diagnosis present

## 2020-03-30 DIAGNOSIS — D62 Acute posthemorrhagic anemia: Secondary | ICD-10-CM | POA: Diagnosis present

## 2020-03-30 DIAGNOSIS — S24109A Unspecified injury at unspecified level of thoracic spinal cord, initial encounter: Secondary | ICD-10-CM

## 2020-03-30 DIAGNOSIS — F430 Acute stress reaction: Secondary | ICD-10-CM | POA: Diagnosis not present

## 2020-03-30 DIAGNOSIS — J9690 Respiratory failure, unspecified, unspecified whether with hypoxia or hypercapnia: Secondary | ICD-10-CM | POA: Diagnosis present

## 2020-03-30 DIAGNOSIS — I959 Hypotension, unspecified: Secondary | ICD-10-CM

## 2020-03-30 DIAGNOSIS — R000 Tachycardia, unspecified: Secondary | ICD-10-CM | POA: Diagnosis not present

## 2020-03-30 DIAGNOSIS — F1290 Cannabis use, unspecified, uncomplicated: Secondary | ICD-10-CM | POA: Diagnosis present

## 2020-03-30 DIAGNOSIS — R079 Chest pain, unspecified: Secondary | ICD-10-CM

## 2020-03-30 DIAGNOSIS — W3400XA Accidental discharge from unspecified firearms or gun, initial encounter: Secondary | ICD-10-CM

## 2020-03-30 DIAGNOSIS — R45850 Homicidal ideations: Secondary | ICD-10-CM | POA: Diagnosis not present

## 2020-03-30 DIAGNOSIS — I10 Essential (primary) hypertension: Secondary | ICD-10-CM | POA: Diagnosis present

## 2020-03-30 DIAGNOSIS — F29 Unspecified psychosis not due to a substance or known physiological condition: Secondary | ICD-10-CM | POA: Diagnosis not present

## 2020-03-30 DIAGNOSIS — U071 COVID-19: Secondary | ICD-10-CM | POA: Diagnosis present

## 2020-03-30 DIAGNOSIS — G8220 Paraplegia, unspecified: Secondary | ICD-10-CM | POA: Diagnosis not present

## 2020-03-30 DIAGNOSIS — T794XXA Traumatic shock, initial encounter: Secondary | ICD-10-CM | POA: Diagnosis present

## 2020-03-30 DIAGNOSIS — S22079B Unspecified fracture of T9-T10 vertebra, initial encounter for open fracture: Secondary | ICD-10-CM | POA: Diagnosis present

## 2020-03-30 DIAGNOSIS — S21339A Puncture wound without foreign body of unspecified front wall of thorax with penetration into thoracic cavity, initial encounter: Secondary | ICD-10-CM | POA: Diagnosis present

## 2020-03-30 DIAGNOSIS — R45851 Suicidal ideations: Secondary | ICD-10-CM | POA: Diagnosis not present

## 2020-03-30 DIAGNOSIS — R0600 Dyspnea, unspecified: Secondary | ICD-10-CM

## 2020-03-30 DIAGNOSIS — G8221 Paraplegia, complete: Secondary | ICD-10-CM | POA: Diagnosis present

## 2020-03-30 DIAGNOSIS — H00016 Hordeolum externum left eye, unspecified eyelid: Secondary | ICD-10-CM | POA: Diagnosis present

## 2020-03-30 DIAGNOSIS — F4320 Adjustment disorder, unspecified: Secondary | ICD-10-CM | POA: Diagnosis present

## 2020-03-30 DIAGNOSIS — Z653 Problems related to other legal circumstances: Secondary | ICD-10-CM

## 2020-03-30 DIAGNOSIS — G4700 Insomnia, unspecified: Secondary | ICD-10-CM | POA: Diagnosis present

## 2020-03-30 DIAGNOSIS — J939 Pneumothorax, unspecified: Secondary | ICD-10-CM

## 2020-03-30 DIAGNOSIS — I808 Phlebitis and thrombophlebitis of other sites: Secondary | ICD-10-CM | POA: Diagnosis present

## 2020-03-30 DIAGNOSIS — S22089B Unspecified fracture of T11-T12 vertebra, initial encounter for open fracture: Secondary | ICD-10-CM | POA: Diagnosis present

## 2020-03-30 DIAGNOSIS — Z4682 Encounter for fitting and adjustment of non-vascular catheter: Secondary | ICD-10-CM

## 2020-03-30 DIAGNOSIS — S2231XA Fracture of one rib, right side, initial encounter for closed fracture: Secondary | ICD-10-CM | POA: Diagnosis present

## 2020-03-30 DIAGNOSIS — F329 Major depressive disorder, single episode, unspecified: Secondary | ICD-10-CM | POA: Diagnosis present

## 2020-03-30 DIAGNOSIS — S22000A Wedge compression fracture of unspecified thoracic vertebra, initial encounter for closed fracture: Secondary | ICD-10-CM

## 2020-03-30 DIAGNOSIS — N319 Neuromuscular dysfunction of bladder, unspecified: Secondary | ICD-10-CM | POA: Diagnosis present

## 2020-03-30 DIAGNOSIS — Z79899 Other long term (current) drug therapy: Secondary | ICD-10-CM

## 2020-03-30 DIAGNOSIS — S2249XA Multiple fractures of ribs, unspecified side, initial encounter for closed fracture: Secondary | ICD-10-CM

## 2020-03-30 LAB — POCT I-STAT 7, (LYTES, BLD GAS, ICA,H+H)
Acid-Base Excess: 1 mmol/L (ref 0.0–2.0)
Acid-base deficit: 2 mmol/L (ref 0.0–2.0)
Bicarbonate: 22.6 mmol/L (ref 20.0–28.0)
Bicarbonate: 24.3 mmol/L (ref 20.0–28.0)
Calcium, Ion: 1.1 mmol/L — ABNORMAL LOW (ref 1.15–1.40)
Calcium, Ion: 1.14 mmol/L — ABNORMAL LOW (ref 1.15–1.40)
HCT: 31 % — ABNORMAL LOW (ref 39.0–52.0)
HCT: 35 % — ABNORMAL LOW (ref 39.0–52.0)
Hemoglobin: 10.5 g/dL — ABNORMAL LOW (ref 13.0–17.0)
Hemoglobin: 11.9 g/dL — ABNORMAL LOW (ref 13.0–17.0)
O2 Saturation: 99 %
O2 Saturation: 100 %
Patient temperature: 98.6
Patient temperature: 98.6
Potassium: 3.9 mmol/L (ref 3.5–5.1)
Potassium: 3.7 mmol/L (ref 3.5–5.1)
Sodium: 140 mmol/L (ref 135–145)
Sodium: 142 mmol/L (ref 135–145)
TCO2: 24 mmol/L (ref 22–32)
TCO2: 25 mmol/L (ref 22–32)
pCO2 arterial: 38.7 mmHg (ref 32.0–48.0)
pCO2 arterial: 34.5 mmHg (ref 32.0–48.0)
pH, Arterial: 7.373 (ref 7.350–7.450)
pH, Arterial: 7.456 — ABNORMAL HIGH (ref 7.350–7.450)
pO2, Arterial: 139 mmHg — ABNORMAL HIGH (ref 83.0–108.0)
pO2, Arterial: 206 mmHg — ABNORMAL HIGH (ref 83.0–108.0)

## 2020-03-30 LAB — COMPREHENSIVE METABOLIC PANEL
ALT: 14 U/L (ref 0–44)
AST: 19 U/L (ref 15–41)
Albumin: 3.1 g/dL — ABNORMAL LOW (ref 3.5–5.0)
Alkaline Phosphatase: 30 U/L — ABNORMAL LOW (ref 38–126)
Anion gap: 13 (ref 5–15)
BUN: 14 mg/dL (ref 6–20)
CO2: 17 mmol/L — ABNORMAL LOW (ref 22–32)
Calcium: 8 mg/dL — ABNORMAL LOW (ref 8.9–10.3)
Chloride: 112 mmol/L — ABNORMAL HIGH (ref 98–111)
Creatinine, Ser: 1.54 mg/dL — ABNORMAL HIGH (ref 0.61–1.24)
GFR calc Af Amer: >60 mL/min (ref >60)
GFR calc non Af Amer: >60 mL/min (ref >60)
Glucose, Bld: 181 mg/dL — ABNORMAL HIGH (ref 70–99)
Potassium: 3.3 mmol/L — ABNORMAL LOW (ref 3.5–5.1)
Sodium: 142 mmol/L (ref 135–145)
Total Bilirubin: 0.5 mg/dL (ref 0.3–1.2)
Total Protein: 5.4 g/dL — ABNORMAL LOW (ref 6.5–8.1)

## 2020-03-30 LAB — DIC (DISSEMINATED INTRAVASCULAR COAGULATION)PANEL
D-Dimer, Quant: 4.95 ug/mL-FEU — ABNORMAL HIGH (ref 0.00–0.50)
D-Dimer, Quant: >20 ug/mL-FEU — ABNORMAL HIGH (ref 0.00–0.50)
D-Dimer, Quant: >20 ug/mL-FEU — ABNORMAL HIGH (ref 0.00–0.50)
Fibrinogen: 230 mg/dL (ref 210–475)
Fibrinogen: 206 mg/dL — ABNORMAL LOW (ref 210–475)
Fibrinogen: 215 mg/dL (ref 210–475)
INR: 1.1 (ref 0.8–1.2)
INR: 1.1 (ref 0.8–1.2)
INR: 1.2 (ref 0.8–1.2)
Platelets: 218 10*3/uL (ref 150–400)
Platelets: 151 10*3/uL (ref 150–400)
Platelets: 143 10*3/uL — ABNORMAL LOW (ref 150–400)
Prothrombin Time: 13.7 seconds (ref 11.4–15.2)
Prothrombin Time: 13.7 seconds (ref 11.4–15.2)
Prothrombin Time: 14.3 seconds (ref 11.4–15.2)
Smear Review: NONE SEEN
Smear Review: NONE SEEN
Smear Review: NONE SEEN
aPTT: 23 seconds — ABNORMAL LOW (ref 24–36)
aPTT: 21 seconds — ABNORMAL LOW (ref 24–36)
aPTT: 23 seconds — ABNORMAL LOW (ref 24–36)

## 2020-03-30 LAB — CBG MONITORING, ED: Glucose-Capillary: 172 mg/dL — ABNORMAL HIGH (ref 70–99)

## 2020-03-30 LAB — I-STAT CHEM 8, ED
BUN: 15 mg/dL (ref 6–20)
Calcium, Ion: 1.08 mmol/L — ABNORMAL LOW (ref 1.15–1.40)
Chloride: 107 mmol/L (ref 98–111)
Creatinine, Ser: 1.4 mg/dL — ABNORMAL HIGH (ref 0.61–1.24)
Glucose, Bld: 169 mg/dL — ABNORMAL HIGH (ref 70–99)
HCT: 35 % — ABNORMAL LOW (ref 39.0–52.0)
Hemoglobin: 11.9 g/dL — ABNORMAL LOW (ref 13.0–17.0)
Potassium: 3.2 mmol/L — ABNORMAL LOW (ref 3.5–5.1)
Sodium: 142 mmol/L (ref 135–145)
TCO2: 19 mmol/L — ABNORMAL LOW (ref 22–32)

## 2020-03-30 LAB — CBC
HCT: 34.9 % — ABNORMAL LOW (ref 39.0–52.0)
HCT: 35.8 % — ABNORMAL LOW (ref 39.0–52.0)
HCT: 31.5 % — ABNORMAL LOW (ref 39.0–52.0)
Hemoglobin: 10.5 g/dL — ABNORMAL LOW (ref 13.0–17.0)
Hemoglobin: 11.7 g/dL — ABNORMAL LOW (ref 13.0–17.0)
Hemoglobin: 10.4 g/dL — ABNORMAL LOW (ref 13.0–17.0)
MCH: 24.1 pg — ABNORMAL LOW (ref 26.0–34.0)
MCH: 26 pg (ref 26.0–34.0)
MCH: 26.1 pg (ref 26.0–34.0)
MCHC: 30.1 g/dL (ref 30.0–36.0)
MCHC: 32.7 g/dL (ref 30.0–36.0)
MCHC: 33 g/dL (ref 30.0–36.0)
MCV: 80.2 fL (ref 80.0–100.0)
MCV: 79.6 fL — ABNORMAL LOW (ref 80.0–100.0)
MCV: 78.9 fL — ABNORMAL LOW (ref 80.0–100.0)
Platelets: 218 10*3/uL (ref 150–400)
Platelets: 165 10*3/uL (ref 150–400)
Platelets: 126 10*3/uL — ABNORMAL LOW (ref 150–400)
RBC: 4.35 MIL/uL (ref 4.22–5.81)
RBC: 4.5 MIL/uL (ref 4.22–5.81)
RBC: 3.99 MIL/uL — ABNORMAL LOW (ref 4.22–5.81)
RDW: 14.3 % (ref 11.5–15.5)
RDW: 14.8 % (ref 11.5–15.5)
RDW: 14.6 % (ref 11.5–15.5)
WBC: 4.1 10*3/uL (ref 4.0–10.5)
WBC: 16.8 10*3/uL — ABNORMAL HIGH (ref 4.0–10.5)
WBC: 19.2 10*3/uL — ABNORMAL HIGH (ref 4.0–10.5)
nRBC: 0 % (ref 0.0–0.2)
nRBC: 0 % (ref 0.0–0.2)
nRBC: 0 % (ref 0.0–0.2)

## 2020-03-30 LAB — BASIC METABOLIC PANEL
Anion gap: 9 (ref 5–15)
BUN: 13 mg/dL (ref 6–20)
CO2: 23 mmol/L (ref 22–32)
Calcium: 8.1 mg/dL — ABNORMAL LOW (ref 8.9–10.3)
Chloride: 108 mmol/L (ref 98–111)
Creatinine, Ser: 1.23 mg/dL (ref 0.61–1.24)
GFR calc Af Amer: >60 mL/min (ref >60)
GFR calc non Af Amer: >60 mL/min (ref >60)
Glucose, Bld: 136 mg/dL — ABNORMAL HIGH (ref 70–99)
Potassium: 3.8 mmol/L (ref 3.5–5.1)
Sodium: 140 mmol/L (ref 135–145)

## 2020-03-30 LAB — BPAM PLATELET PHERESIS
Blood Product Expiration Date: 202105052359
ISSUE DATE / TIME: 202105040219
Unit Type and Rh: 600

## 2020-03-30 LAB — MASSIVE TRANSFUSION PROTOCOL ORDER (BLOOD BANK NOTIFICATION)

## 2020-03-30 LAB — RESPIRATORY PANEL BY RT PCR (FLU A&B, COVID)
Influenza A by PCR: NEGATIVE
Influenza B by PCR: NEGATIVE
SARS Coronavirus 2 by RT PCR: POSITIVE — AB

## 2020-03-30 LAB — PREPARE PLATELET PHERESIS: Unit division: 0

## 2020-03-30 LAB — ABO/RH: ABO/RH(D): O POS

## 2020-03-30 LAB — GLUCOSE, CAPILLARY
Glucose-Capillary: 105 mg/dL — ABNORMAL HIGH (ref 70–99)
Glucose-Capillary: 151 mg/dL — ABNORMAL HIGH (ref 70–99)

## 2020-03-30 LAB — ETHANOL: Alcohol, Ethyl (B): <10 mg/dL (ref <10)

## 2020-03-30 LAB — CDS SEROLOGY

## 2020-03-30 LAB — HEMOGLOBIN AND HEMATOCRIT, BLOOD
HCT: 34.2 % — ABNORMAL LOW (ref 39.0–52.0)
Hemoglobin: 11.4 g/dL — ABNORMAL LOW (ref 13.0–17.0)

## 2020-03-30 LAB — MRSA PCR SCREENING: MRSA by PCR: NEGATIVE

## 2020-03-30 LAB — HIV ANTIBODY (ROUTINE TESTING W REFLEX): HIV Screen 4th Generation wRfx: NONREACTIVE

## 2020-03-30 MED ORDER — OXYCODONE HCL 5 MG PO TABS
5.0000 mg | ORAL_TABLET | ORAL | Status: DC | PRN
  Administered 2020-03-30 (×2): 10 mg via ORAL
  Administered 2020-03-31: 5 mg via ORAL
  Administered 2020-03-31 – 2020-04-01 (×6): 10 mg via ORAL
  Filled 2020-03-30 (×9): qty 2

## 2020-03-30 MED ORDER — EPINEPHRINE 0.1 MG/10ML (10 MCG/ML) SYRINGE FOR IV PUSH (FOR BLOOD PRESSURE SUPPORT)
PREFILLED_SYRINGE | INTRAVENOUS | Status: AC
  Filled 2020-03-30: qty 10

## 2020-03-30 MED ORDER — MIDAZOLAM HCL 2 MG/2ML IJ SOLN
INTRAMUSCULAR | Status: AC
  Filled 2020-03-30: qty 2

## 2020-03-30 MED ORDER — ORAL CARE MOUTH RINSE
15.0000 mL | OROMUCOSAL | Status: DC
  Administered 2020-03-30 – 2020-03-31 (×8): 15 mL via OROMUCOSAL

## 2020-03-30 MED ORDER — SODIUM CHLORIDE 0.9% FLUSH
10.0000 mL | Freq: Two times a day (BID) | INTRAVENOUS | Status: DC
  Administered 2020-03-30 – 2020-04-05 (×12): 10 mL
  Administered 2020-04-06: 40 mL
  Administered 2020-04-06: 10 mL
  Administered 2020-04-07: 40 mL

## 2020-03-30 MED ORDER — GABAPENTIN 250 MG/5ML PO SOLN
100.0000 mg | Freq: Three times a day (TID) | ORAL | Status: DC
  Administered 2020-03-30 – 2020-03-31 (×2): 100 mg via ORAL
  Filled 2020-03-30 (×6): qty 2

## 2020-03-30 MED ORDER — MIDAZOLAM HCL 2 MG/2ML IJ SOLN
2.0000 mg | INTRAMUSCULAR | Status: DC | PRN
  Administered 2020-03-30: 2 mg via INTRAVENOUS
  Filled 2020-03-30: qty 2

## 2020-03-30 MED ORDER — OXYCODONE HCL 5 MG/5ML PO SOLN: 5.0000 mg | ORAL | Status: DC | PRN

## 2020-03-30 MED ORDER — MIDAZOLAM HCL (PF) 5 MG/ML IJ SOLN: 4.0000 mg | Freq: Once | INTRAMUSCULAR | Status: DC

## 2020-03-30 MED ORDER — LACTATED RINGERS IV SOLN: INTRAVENOUS | Status: DC

## 2020-03-30 MED ORDER — METHOCARBAMOL 1000 MG/10ML IJ SOLN
1000.0000 mg | Freq: Three times a day (TID) | INTRAVENOUS | Status: DC
  Administered 2020-03-30 – 2020-03-31 (×3): 1000 mg via INTRAVENOUS
  Filled 2020-03-30: qty 1000
  Filled 2020-03-30 (×4): qty 10

## 2020-03-30 MED ORDER — ENOXAPARIN SODIUM 30 MG/0.3ML ~~LOC~~ SOLN
30.0000 mg | Freq: Two times a day (BID) | SUBCUTANEOUS | Status: DC
  Administered 2020-03-30 – 2020-04-16 (×34): 30 mg via SUBCUTANEOUS
  Filled 2020-03-30 (×33): qty 0.3

## 2020-03-30 MED ORDER — DEXMEDETOMIDINE HCL IN NACL 400 MCG/100ML IV SOLN
0.4000 ug/kg/h | INTRAVENOUS | Status: DC
  Administered 2020-03-30: 0.6 ug/kg/h via INTRAVENOUS
  Administered 2020-03-30: 0.7 ug/kg/h via INTRAVENOUS
  Filled 2020-03-30: qty 100
  Filled 2020-03-30: qty 200

## 2020-03-30 MED ORDER — LACTATED RINGERS IV BOLUS
2000.0000 mL | Freq: Once | INTRAVENOUS | Status: AC
  Administered 2020-03-30: 1000 mL via INTRAVENOUS

## 2020-03-30 MED ORDER — SODIUM CHLORIDE 0.9 % IV SOLN
INTRAVENOUS | Status: AC | PRN
  Administered 2020-03-30: 2000 mL via INTRAVENOUS

## 2020-03-30 MED ORDER — CHLORHEXIDINE GLUCONATE CLOTH 2 % EX PADS
6.0000 | MEDICATED_PAD | Freq: Every day | CUTANEOUS | Status: DC
  Administered 2020-03-30 – 2020-04-10 (×12): 6 via TOPICAL

## 2020-03-30 MED ORDER — MIDAZOLAM HCL 2 MG/2ML IJ SOLN
2.0000 mg | Freq: Once | INTRAMUSCULAR | Status: AC
  Administered 2020-03-30: 2 mg via INTRAVENOUS

## 2020-03-30 MED ORDER — ONDANSETRON HCL 4 MG/2ML IJ SOLN
4.0000 mg | Freq: Four times a day (QID) | INTRAMUSCULAR | Status: DC | PRN
  Administered 2020-03-30 – 2020-04-06 (×5): 4 mg via INTRAVENOUS
  Filled 2020-03-30 (×5): qty 2

## 2020-03-30 MED ORDER — FENTANYL 2500MCG IN NS 250ML (10MCG/ML) PREMIX INFUSION
0.0000 ug/h | INTRAVENOUS | Status: DC
  Filled 2020-03-30: qty 250

## 2020-03-30 MED ORDER — ACETAMINOPHEN 500 MG PO TABS
1000.0000 mg | ORAL_TABLET | Freq: Four times a day (QID) | ORAL | Status: DC
  Administered 2020-03-30 – 2020-04-07 (×30): 1000 mg via ORAL
  Filled 2020-03-30 (×31): qty 2

## 2020-03-30 MED ORDER — PANTOPRAZOLE SODIUM 40 MG IV SOLR
40.0000 mg | Freq: Every day | INTRAVENOUS | Status: DC
  Administered 2020-03-30: 40 mg via INTRAVENOUS
  Filled 2020-03-30: qty 40

## 2020-03-30 MED ORDER — MIDAZOLAM 50MG/50ML (1MG/ML) PREMIX INFUSION
2.0000 mg/h | INTRAVENOUS | Status: DC
  Administered 2020-03-30: 4 mg/h via INTRAVENOUS
  Filled 2020-03-30: qty 50

## 2020-03-30 MED ORDER — ROCURONIUM BROMIDE 50 MG/5ML IV SOLN
INTRAVENOUS | Status: AC | PRN
  Administered 2020-03-30: 80 mg via INTRAVENOUS

## 2020-03-30 MED ORDER — FENTANYL CITRATE (PF) 100 MCG/2ML IJ SOLN
INTRAMUSCULAR | Status: AC | PRN
  Administered 2020-03-30: 100 ug via INTRAVENOUS

## 2020-03-30 MED ORDER — ONDANSETRON 4 MG PO TBDP
4.0000 mg | ORAL_TABLET | Freq: Four times a day (QID) | ORAL | Status: DC | PRN
  Filled 2020-03-30: qty 1

## 2020-03-30 MED ORDER — SODIUM CHLORIDE 0.9 % IV BOLUS
1000.0000 mL | Freq: Once | INTRAVENOUS | Status: AC
  Administered 2020-03-30: 1000 mL via INTRAVENOUS

## 2020-03-30 MED ORDER — CHLORHEXIDINE GLUCONATE 0.12% ORAL RINSE (MEDLINE KIT)
15.0000 mL | Freq: Two times a day (BID) | OROMUCOSAL | Status: DC
  Administered 2020-03-30 – 2020-04-13 (×22): 15 mL via OROMUCOSAL

## 2020-03-30 MED ORDER — OXYCODONE HCL 5 MG/5ML PO SOLN
5.0000 mg | ORAL | Status: DC | PRN
  Administered 2020-03-30: 10 mg
  Filled 2020-03-30: qty 10

## 2020-03-30 MED ORDER — MIDAZOLAM 50MG/50ML (1MG/ML) PREMIX INFUSION
0.5000 mg/h | INTRAVENOUS | Status: DC
  Administered 2020-03-30: 0.5 mg/h via INTRAVENOUS
  Filled 2020-03-30: qty 50

## 2020-03-30 MED ORDER — SODIUM CHLORIDE 0.9% FLUSH: 10.0000 mL | INTRAVENOUS | Status: DC | PRN

## 2020-03-30 MED ORDER — PANTOPRAZOLE SODIUM 40 MG PO TBEC: 40.0000 mg | DELAYED_RELEASE_TABLET | Freq: Every day | ORAL | Status: DC

## 2020-03-30 MED ORDER — FENTANYL 2500MCG IN NS 250ML (10MCG/ML) PREMIX INFUSION
0.0000 ug/h | INTRAVENOUS | Status: DC
  Administered 2020-03-30: 300 ug/h via INTRAVENOUS
  Filled 2020-03-30: qty 250

## 2020-03-30 MED ORDER — ETOMIDATE 2 MG/ML IV SOLN
INTRAVENOUS | Status: AC | PRN
  Administered 2020-03-30: 20 mg via INTRAVENOUS

## 2020-03-30 MED ORDER — SODIUM CHLORIDE 0.9% IV SOLUTION: Freq: Once | INTRAVENOUS | Status: AC

## 2020-03-30 MED ORDER — IOHEXOL 300 MG/ML  SOLN
100.0000 mL | Freq: Once | INTRAMUSCULAR | Status: AC | PRN
  Administered 2020-03-30: 100 mL via INTRAVENOUS

## 2020-03-30 MED ORDER — ACETAMINOPHEN 500 MG PO TABS
1000.0000 mg | ORAL_TABLET | Freq: Four times a day (QID) | ORAL | Status: DC
  Administered 2020-03-30: 1000 mg
  Filled 2020-03-30: qty 2

## 2020-03-30 MED ORDER — DEXTROSE-NACL 5-0.9 % IV SOLN: INTRAVENOUS | Status: DC

## 2020-03-30 MED ORDER — FENTANYL CITRATE (PF) 100 MCG/2ML IJ SOLN
INTRAMUSCULAR | Status: AC
  Filled 2020-03-30: qty 2

## 2020-03-30 NOTE — Progress Notes (Signed)
CRITICAL VALUE ALERT  Critical Value:  hbg 6.7  Date & Time Notied:  03/30/20 2340  Provider Notified: Fredricka Bonine, MD  Orders Received/Actions taken: transfuse 2 units PRBC followed by repeat H&H
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
## 2020-03-30 NOTE — Procedures (Signed)
Extubation Procedure Note  Patient Details:   Name: [name] DOB: 11-Sep-1991 MRN: [id number]   Airway Documentation:    Vent end date: 03/30/20 Vent end time: 1615   Evaluation  O2 sats: stable throughout Complications: No apparent complications Patient did tolerate procedure well. Bilateral Breath Sounds: Clear, Diminished   Yes   Patient extubated per MD order. Positive cuff leak. RN at bedside. Vitals are stable on 3L [place]. Patient had good strong cough and able to talk.   Harmon Dun Rydge [place] 03/30/2020, 4:18 PM

## 2020-03-30 NOTE — ED Notes (Signed)
Blood bank called to activate MTP

## 2020-03-30 NOTE — ED Notes (Signed)
Attempt x2 to get blood for morning labs un able to get.nurse is awear.

## 2020-03-30 NOTE — Progress Notes (Signed)
Wasted versed IV per protocol. 40cc. Two RNs Peggyann Juba RN as 2nd Charity fundraiser.
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
## 2020-03-30 NOTE — Procedures (Signed)
A double catheter was placed via a Seldinger technique into the R femoral vein.  All port were aspirated and flushed appropriately.   This was secured with 2-0 silk

## 2020-03-30 NOTE — Social Work (Signed)
CSW was unable to complete sbirt due to pt being on the ventilator. CSW may attempt to complete at more appropriate time.   Samad Thon, LCSWA, LCASA Clinical Social Worker [phone]    
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
## 2020-03-30 NOTE — Progress Notes (Signed)
Chaplain responded to page. Met family in Consultation A.  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  Mother has six children, 4 boys, 2 daughters.  Three sons came to wait with her in consultation.  Chaplain brought beverages and stayed with them after doctor spoke with them. Chaplain escorted mother and sons to see patient before being moved to another floor.  Mother was very resolute and prayed for son.  Chaplain will refer this to unit chaplain. Rev. Tamsen Snider Pager [phone]

## 2020-03-30 NOTE — ED Notes (Signed)
Blood specimens for DIC / Hgb sent to lab .

## 2020-03-30 NOTE — Progress Notes (Signed)
   03/30/20 1600  Clinical Encounter Type  Visited With Patient;Health care provider  Visit Type Initial  Referral From Nurse  Consult/Referral To Chaplain  Spiritual Encounters  Spiritual Needs Emotional   Chaplain visited [name] for introduction and emotional support. Nursing staff were working with [name]'s vent and orienting him to his current situation. Chaplain kept visit short. Chaplains remain available for support as needs arise.   Chaplain Resident, Amado Coe, M Div [phone] new on-call pager
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
## 2020-03-30 NOTE — Procedures (Signed)
Pigtail catheter was placed in the R chest in the 4th ICS via  A seldinger technique.  150cc of blood returned.  CXR confirmed placement.  The catheter was sutured in place.  Pt tol it well.
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
## 2020-03-31 ENCOUNTER — Inpatient Hospital Stay (HOSPITAL_COMMUNITY): Payer: Medicaid Other

## 2020-03-31 LAB — CBC
HCT: 20.3 % — ABNORMAL LOW (ref 39.0–52.0)
HCT: 31.4 % — ABNORMAL LOW (ref 39.0–52.0)
HCT: 30.7 % — ABNORMAL LOW (ref 39.0–52.0)
Hemoglobin: 6.7 g/dL — CL (ref 13.0–17.0)
Hemoglobin: 10.7 g/dL — ABNORMAL LOW (ref 13.0–17.0)
Hemoglobin: 10.4 g/dL — ABNORMAL LOW (ref 13.0–17.0)
MCH: 26.7 pg (ref 26.0–34.0)
MCH: 27.6 pg (ref 26.0–34.0)
MCH: 27.3 pg (ref 26.0–34.0)
MCHC: 33 g/dL (ref 30.0–36.0)
MCHC: 34.1 g/dL (ref 30.0–36.0)
MCHC: 33.9 g/dL (ref 30.0–36.0)
MCV: 80.9 fL (ref 80.0–100.0)
MCV: 80.9 fL (ref 80.0–100.0)
MCV: 80.6 fL (ref 80.0–100.0)
Platelets: 83 10*3/uL — ABNORMAL LOW (ref 150–400)
Platelets: 95 10*3/uL — ABNORMAL LOW (ref 150–400)
Platelets: 94 10*3/uL — ABNORMAL LOW (ref 150–400)
RBC: 2.51 MIL/uL — ABNORMAL LOW (ref 4.22–5.81)
RBC: 3.88 MIL/uL — ABNORMAL LOW (ref 4.22–5.81)
RBC: 3.81 MIL/uL — ABNORMAL LOW (ref 4.22–5.81)
RDW: 15 % (ref 11.5–15.5)
RDW: 14.9 % (ref 11.5–15.5)
RDW: 14.7 % (ref 11.5–15.5)
WBC: 12.8 10*3/uL — ABNORMAL HIGH (ref 4.0–10.5)
WBC: 15.9 10*3/uL — ABNORMAL HIGH (ref 4.0–10.5)
WBC: 13.7 10*3/uL — ABNORMAL HIGH (ref 4.0–10.5)
nRBC: 0 % (ref 0.0–0.2)
nRBC: 0 % (ref 0.0–0.2)
nRBC: 0 % (ref 0.0–0.2)

## 2020-03-31 LAB — BASIC METABOLIC PANEL
Anion gap: 7 (ref 5–15)
BUN: 8 mg/dL (ref 6–20)
CO2: 24 mmol/L (ref 22–32)
Calcium: 8.2 mg/dL — ABNORMAL LOW (ref 8.9–10.3)
Chloride: 108 mmol/L (ref 98–111)
Creatinine, Ser: 1.17 mg/dL (ref 0.61–1.24)
GFR calc Af Amer: >60 mL/min (ref >60)
GFR calc non Af Amer: >60 mL/min (ref >60)
Glucose, Bld: 113 mg/dL — ABNORMAL HIGH (ref 70–99)
Potassium: 4.3 mmol/L (ref 3.5–5.1)
Sodium: 139 mmol/L (ref 135–145)

## 2020-03-31 LAB — PREPARE FRESH FROZEN PLASMA
Unit division: 0
Unit division: 0

## 2020-03-31 LAB — BPAM FFP
Blood Product Expiration Date: 202105042359
Blood Product Expiration Date: 202105042359
ISSUE DATE / TIME: 202105040204
ISSUE DATE / TIME: 202105040204
Unit Type and Rh: 6200
Unit Type and Rh: 6200

## 2020-03-31 LAB — MAGNESIUM: Magnesium: 1.5 mg/dL — ABNORMAL LOW (ref 1.7–2.4)

## 2020-03-31 LAB — PHOSPHORUS: Phosphorus: 3.1 mg/dL (ref 2.5–4.6)

## 2020-03-31 LAB — PREPARE RBC (CROSSMATCH)

## 2020-03-31 MED ORDER — MAGNESIUM SULFATE 2 GM/50ML IV SOLN
2.0000 g | Freq: Once | INTRAVENOUS | Status: AC
  Administered 2020-03-31: 2 g via INTRAVENOUS
  Filled 2020-03-31: qty 50

## 2020-03-31 MED ORDER — BISACODYL 10 MG RE SUPP
10.0000 mg | Freq: Every day | RECTAL | Status: DC
  Administered 2020-03-31 – 2020-04-08 (×6): 10 mg via RECTAL
  Filled 2020-03-31 (×14): qty 1

## 2020-03-31 MED ORDER — GABAPENTIN 250 MG/5ML PO SOLN
200.0000 mg | Freq: Three times a day (TID) | ORAL | Status: DC
  Administered 2020-03-31 – 2020-04-02 (×6): 200 mg via ORAL
  Filled 2020-03-31 (×9): qty 4

## 2020-03-31 MED ORDER — MAGNESIUM OXIDE 400 (241.3 MG) MG PO TABS
400.0000 mg | ORAL_TABLET | Freq: Once | ORAL | Status: AC
  Administered 2020-03-31: 400 mg via ORAL
  Filled 2020-03-31: qty 1

## 2020-03-31 MED ORDER — LIDOCAINE 5 % EX PTCH
1.0000 | MEDICATED_PATCH | CUTANEOUS | Status: DC
  Administered 2020-03-31 – 2020-04-16 (×17): 1 via TRANSDERMAL
  Filled 2020-03-31 (×17): qty 1

## 2020-03-31 MED ORDER — METHOCARBAMOL 500 MG PO TABS
1000.0000 mg | ORAL_TABLET | Freq: Three times a day (TID) | ORAL | Status: DC
  Administered 2020-03-31 – 2020-04-02 (×7): 1000 mg via ORAL
  Filled 2020-03-31 (×7): qty 2
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
## 2020-03-31 NOTE — Consult Note (Signed)
  Psychiatry consult attempted via telemedicine.  Patient currently unable to participate in consult.  Spoke with attending RN, will attempt psychiatry consult again tomorrow.  MD made aware.

## 2020-03-31 NOTE — Progress Notes (Signed)
Patient refused psychiatry consult.  He indicated being in pain and tired.  Asked for consult to be at a later time.

## 2020-03-31 NOTE — Progress Notes (Signed)
Per Rainey Pines, patient can have tele visit with mom.

## 2020-04-01 ENCOUNTER — Inpatient Hospital Stay (HOSPITAL_COMMUNITY): Payer: Medicaid Other

## 2020-04-01 ENCOUNTER — Encounter (HOSPITAL_COMMUNITY): Payer: Self-pay | Admitting: General Surgery

## 2020-04-01 DIAGNOSIS — F4311 Post-traumatic stress disorder, acute: Secondary | ICD-10-CM

## 2020-04-01 LAB — TYPE AND SCREEN
ABO/RH(D): O POS
Antibody Screen: NEGATIVE
Unit division: 0
Unit division: 0
Unit division: 0
Unit division: 0
Unit division: 0
Unit division: 0
Unit division: 0
Unit division: 0
Unit division: 0
Unit division: 0

## 2020-04-01 LAB — CBC
HCT: 31.9 % — ABNORMAL LOW (ref 39.0–52.0)
Hemoglobin: 10.5 g/dL — ABNORMAL LOW (ref 13.0–17.0)
MCH: 27 pg (ref 26.0–34.0)
MCHC: 32.9 g/dL (ref 30.0–36.0)
MCV: 82 fL (ref 80.0–100.0)
Platelets: 105 10*3/uL — ABNORMAL LOW (ref 150–400)
RBC: 3.89 MIL/uL — ABNORMAL LOW (ref 4.22–5.81)
RDW: 15.2 % (ref 11.5–15.5)
WBC: 12.4 10*3/uL — ABNORMAL HIGH (ref 4.0–10.5)
nRBC: 0 % (ref 0.0–0.2)

## 2020-04-01 LAB — BPAM RBC
Blood Product Expiration Date: 202105312359
Blood Product Expiration Date: 202106012359
Blood Product Expiration Date: 202106012359
Blood Product Expiration Date: 202106022359
Blood Product Expiration Date: 202106022359
Blood Product Expiration Date: 202106032359
Blood Product Expiration Date: 202106032359
Blood Product Expiration Date: 202106032359
Blood Product Expiration Date: 202106032359
Blood Product Expiration Date: 202106032359
ISSUE DATE / TIME: 202105040153
ISSUE DATE / TIME: 202105040153
ISSUE DATE / TIME: 202105040202
ISSUE DATE / TIME: 202105040202
ISSUE DATE / TIME: 202105040218
ISSUE DATE / TIME: 202105040808
ISSUE DATE / TIME: 202105040808
ISSUE DATE / TIME: 202105050059
ISSUE DATE / TIME: 202105050059
Unit Type and Rh: 5100
Unit Type and Rh: 5100
Unit Type and Rh: 5100
Unit Type and Rh: 5100
Unit Type and Rh: 5100
Unit Type and Rh: 5100
Unit Type and Rh: 5100
Unit Type and Rh: 5100
Unit Type and Rh: 5100
Unit Type and Rh: 5100

## 2020-04-01 LAB — BASIC METABOLIC PANEL
Anion gap: 7 (ref 5–15)
BUN: 8 mg/dL (ref 6–20)
CO2: 28 mmol/L (ref 22–32)
Calcium: 8.2 mg/dL — ABNORMAL LOW (ref 8.9–10.3)
Chloride: 101 mmol/L (ref 98–111)
Creatinine, Ser: 1.29 mg/dL — ABNORMAL HIGH (ref 0.61–1.24)
GFR calc Af Amer: >60 mL/min (ref >60)
GFR calc non Af Amer: >60 mL/min (ref >60)
Glucose, Bld: 106 mg/dL — ABNORMAL HIGH (ref 70–99)
Potassium: 4.2 mmol/L (ref 3.5–5.1)
Sodium: 136 mmol/L (ref 135–145)

## 2020-04-01 LAB — MAGNESIUM: Magnesium: 1.9 mg/dL (ref 1.7–2.4)

## 2020-04-01 LAB — PHOSPHORUS: Phosphorus: 3.5 mg/dL (ref 2.5–4.6)

## 2020-04-01 MED ORDER — TRAZODONE HCL 50 MG PO TABS
50.0000 mg | ORAL_TABLET | Freq: Every evening | ORAL | Status: DC | PRN
  Administered 2020-04-02 – 2020-04-16 (×5): 50 mg via ORAL
  Filled 2020-04-01 (×5): qty 1

## 2020-04-01 MED ORDER — MAGNESIUM OXIDE 400 (241.3 MG) MG PO TABS
400.0000 mg | ORAL_TABLET | Freq: Once | ORAL | Status: AC
  Administered 2020-04-01: 400 mg via ORAL
  Filled 2020-04-01: qty 1

## 2020-04-01 MED ORDER — ARIPIPRAZOLE 2 MG PO TABS
2.0000 mg | ORAL_TABLET | Freq: Every day | ORAL | Status: DC
  Administered 2020-04-01 – 2020-04-05 (×5): 2 mg via ORAL
  Filled 2020-04-01 (×14): qty 1

## 2020-04-01 MED ORDER — TRAMADOL HCL 50 MG PO TABS
50.0000 mg | ORAL_TABLET | Freq: Four times a day (QID) | ORAL | Status: DC
  Administered 2020-04-01 – 2020-04-03 (×9): 50 mg via ORAL
  Filled 2020-04-01 (×9): qty 1

## 2020-04-01 MED ORDER — DOCUSATE SODIUM 100 MG PO CAPS
100.0000 mg | ORAL_CAPSULE | Freq: Two times a day (BID) | ORAL | Status: DC
  Administered 2020-04-01 – 2020-04-13 (×12): 100 mg via ORAL
  Filled 2020-04-01 (×27): qty 1

## 2020-04-01 MED ORDER — OXYCODONE HCL 5 MG PO TABS
10.0000 mg | ORAL_TABLET | ORAL | Status: DC | PRN
  Administered 2020-04-01 – 2020-04-05 (×18): 15 mg via ORAL
  Administered 2020-04-06: 10 mg via ORAL
  Administered 2020-04-06 (×3): 15 mg via ORAL
  Administered 2020-04-06 – 2020-04-07 (×2): 10 mg via ORAL
  Administered 2020-04-07: 15 mg via ORAL
  Administered 2020-04-07: 10 mg via ORAL
  Administered 2020-04-08 (×4): 15 mg via ORAL
  Administered 2020-04-08: 10 mg via ORAL
  Administered 2020-04-09 – 2020-04-16 (×23): 15 mg via ORAL
  Filled 2020-04-01 (×12): qty 3
  Filled 2020-04-01: qty 2
  Filled 2020-04-01 (×5): qty 3
  Filled 2020-04-01: qty 2
  Filled 2020-04-01: qty 3
  Filled 2020-04-01: qty 2
  Filled 2020-04-01 (×9): qty 3
  Filled 2020-04-01: qty 2
  Filled 2020-04-01 (×14): qty 3
  Filled 2020-04-01: qty 2
  Filled 2020-04-01 (×5): qty 3
  Filled 2020-04-01: qty 2
  Filled 2020-04-01 (×5): qty 3

## 2020-04-01 MED ORDER — POLYETHYLENE GLYCOL 3350 17 G PO PACK
17.0000 g | PACK | Freq: Every day | ORAL | Status: DC
  Administered 2020-04-01 – 2020-04-08 (×6): 17 g via ORAL
  Filled 2020-04-01 (×14): qty 1
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
## 2020-04-01 NOTE — Progress Notes (Signed)
   04/01/20 1400  Clinical Encounter Type  Visited With Health care provider  Visit Type Follow-up  Referral From Chaplain  Consult/Referral To Chaplain  Spiritual Encounters  Spiritual Needs Emotional   Chaplain checked notes to see how Doren is doing and noticed he appears to be having a rough day emotionally. New life adjustments and lack of ability to see family during his current COVID-19 infection have Dandra longing for mom. Chaplain provided a teddy bear to be a stand in to mom until the time comes that it is safe for family to visit without risk of spreading infection to family. Chaplain remains available for support as needs arise.   Chaplain Resident, Amado Coe, M Div [phone] on-call pager

## 2020-04-01 NOTE — Progress Notes (Signed)
Orthopedic Tech Progress Note Patient Details:  [name] [birth year] [id number] Called in order to [employer] for a TLSO brace Patient ID: [name], male   DOB: [birth year], 29 y.o.   MRN: [id number]   Donald Pore 04/01/2020, 8:25 AM
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
## 2020-04-01 NOTE — Progress Notes (Signed)
Rehab Admissions Coordinator Note:  Per PT and OT recommendation, this patient was screened by Cheri Rous for appropriateness for an Inpatient Acute Rehab Consult.  Note pt tested + for Covid-19 on 5/4. Per our current protocol, pt will need to be >20 days post initial + test OR have two negative tests prior to being considered for CIR admit.   Will continue to follow along until he meets the above criteria.   Cheri Rous 04/01/2020, 5:33 PM  I can be reached at [phone].
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
## 2020-04-02 ENCOUNTER — Inpatient Hospital Stay (HOSPITAL_COMMUNITY): Payer: Medicaid Other

## 2020-04-02 LAB — CBC
HCT: 31.6 % — ABNORMAL LOW (ref 39.0–52.0)
Hemoglobin: 10.4 g/dL — ABNORMAL LOW (ref 13.0–17.0)
MCH: 26.8 pg (ref 26.0–34.0)
MCHC: 32.9 g/dL (ref 30.0–36.0)
MCV: 81.4 fL (ref 80.0–100.0)
Platelets: 152 10*3/uL (ref 150–400)
RBC: 3.88 MIL/uL — ABNORMAL LOW (ref 4.22–5.81)
RDW: 15.2 % (ref 11.5–15.5)
WBC: 7.5 10*3/uL (ref 4.0–10.5)
nRBC: 0 % (ref 0.0–0.2)

## 2020-04-02 LAB — BASIC METABOLIC PANEL
Anion gap: 7 (ref 5–15)
BUN: 7 mg/dL (ref 6–20)
CO2: 27 mmol/L (ref 22–32)
Calcium: 8 mg/dL — ABNORMAL LOW (ref 8.9–10.3)
Chloride: 101 mmol/L (ref 98–111)
Creatinine, Ser: 0.97 mg/dL (ref 0.61–1.24)
GFR calc Af Amer: >60 mL/min (ref >60)
GFR calc non Af Amer: >60 mL/min (ref >60)
Glucose, Bld: 100 mg/dL — ABNORMAL HIGH (ref 70–99)
Potassium: 3.7 mmol/L (ref 3.5–5.1)
Sodium: 135 mmol/L (ref 135–145)

## 2020-04-02 LAB — PHOSPHORUS: Phosphorus: 4.6 mg/dL (ref 2.5–4.6)

## 2020-04-02 LAB — MAGNESIUM: Magnesium: 1.7 mg/dL (ref 1.7–2.4)

## 2020-04-02 MED ORDER — METHOCARBAMOL 500 MG PO TABS
1000.0000 mg | ORAL_TABLET | Freq: Four times a day (QID) | ORAL | Status: DC
  Administered 2020-04-02 – 2020-04-09 (×27): 1000 mg via ORAL
  Filled 2020-04-02 (×30): qty 2

## 2020-04-02 MED ORDER — MAGNESIUM SULFATE IN D5W 1-5 GM/100ML-% IV SOLN
1.0000 g | Freq: Once | INTRAVENOUS | Status: AC
  Administered 2020-04-02: 1 g via INTRAVENOUS
  Filled 2020-04-02: qty 100

## 2020-04-02 MED ORDER — GABAPENTIN 250 MG/5ML PO SOLN
300.0000 mg | Freq: Three times a day (TID) | ORAL | Status: DC
  Administered 2020-04-02 – 2020-04-05 (×9): 300 mg via ORAL
  Filled 2020-04-02 (×10): qty 6
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
## 2020-04-03 ENCOUNTER — Inpatient Hospital Stay (HOSPITAL_COMMUNITY): Payer: Medicaid Other

## 2020-04-03 LAB — MAGNESIUM: Magnesium: 1.8 mg/dL (ref 1.7–2.4)

## 2020-04-03 LAB — BASIC METABOLIC PANEL
Anion gap: 9 (ref 5–15)
BUN: 9 mg/dL (ref 6–20)
CO2: 27 mmol/L (ref 22–32)
Calcium: 8.2 mg/dL — ABNORMAL LOW (ref 8.9–10.3)
Chloride: 99 mmol/L (ref 98–111)
Creatinine, Ser: 0.98 mg/dL (ref 0.61–1.24)
GFR calc Af Amer: >60 mL/min (ref >60)
GFR calc non Af Amer: >60 mL/min (ref >60)
Glucose, Bld: 103 mg/dL — ABNORMAL HIGH (ref 70–99)
Potassium: 3.8 mmol/L (ref 3.5–5.1)
Sodium: 135 mmol/L (ref 135–145)

## 2020-04-03 MED ORDER — TRAMADOL HCL 50 MG PO TABS
100.0000 mg | ORAL_TABLET | Freq: Four times a day (QID) | ORAL | Status: DC
  Administered 2020-04-03 – 2020-04-16 (×52): 100 mg via ORAL
  Filled 2020-04-03 (×52): qty 2
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
## 2020-04-04 ENCOUNTER — Inpatient Hospital Stay (HOSPITAL_COMMUNITY): Payer: Medicaid Other
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
## 2020-04-04 NOTE — Progress Notes (Signed)
Straight cath done with return of 700 cc of clear, yellow urine. Sterile procedure maintained throughout. Upon initial shift assessment, patient stated, "I know when I have to pee. I will let you know when."

## 2020-04-05 LAB — BASIC METABOLIC PANEL
Anion gap: 11 (ref 5–15)
BUN: 15 mg/dL (ref 6–20)
CO2: 27 mmol/L (ref 22–32)
Calcium: 9.3 mg/dL (ref 8.9–10.3)
Chloride: 95 mmol/L — ABNORMAL LOW (ref 98–111)
Creatinine, Ser: 1.02 mg/dL (ref 0.61–1.24)
GFR calc Af Amer: >60 mL/min (ref >60)
GFR calc non Af Amer: >60 mL/min (ref >60)
Glucose, Bld: 98 mg/dL (ref 70–99)
Potassium: 5 mmol/L (ref 3.5–5.1)
Sodium: 133 mmol/L — ABNORMAL LOW (ref 135–145)

## 2020-04-05 MED ORDER — GABAPENTIN 250 MG/5ML PO SOLN
400.0000 mg | Freq: Three times a day (TID) | ORAL | Status: DC
  Administered 2020-04-05 – 2020-04-06 (×3): 400 mg via ORAL
  Filled 2020-04-05 (×5): qty 8

## 2020-04-05 NOTE — Progress Notes (Signed)
Straight cath done with return of 400 ml of clear, yellow urine. Sterile procedure maintained throughout.
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
## 2020-04-06 MED ORDER — METOPROLOL TARTRATE 5 MG/5ML IV SOLN
INTRAVENOUS | Status: AC
  Administered 2020-04-06: 5 mg via INTRAVENOUS
  Filled 2020-04-06: qty 5

## 2020-04-06 MED ORDER — ZOLPIDEM TARTRATE 5 MG PO TABS
5.0000 mg | ORAL_TABLET | Freq: Every day | ORAL | Status: DC
  Administered 2020-04-07 – 2020-04-16 (×8): 5 mg via ORAL
  Filled 2020-04-06 (×11): qty 1

## 2020-04-06 MED ORDER — MORPHINE SULFATE (PF) 2 MG/ML IV SOLN
2.0000 mg | Freq: Once | INTRAVENOUS | Status: AC
  Administered 2020-04-06: 2 mg via INTRAVENOUS
  Filled 2020-04-06: qty 1

## 2020-04-06 MED ORDER — MAGNESIUM CITRATE PO SOLN
0.5000 | Freq: Once | ORAL | Status: AC
  Administered 2020-04-06: 0.5 via ORAL
  Filled 2020-04-06: qty 296

## 2020-04-06 MED ORDER — METOPROLOL TARTRATE 25 MG PO TABS
25.0000 mg | ORAL_TABLET | Freq: Two times a day (BID) | ORAL | Status: DC
  Administered 2020-04-06 – 2020-04-12 (×13): 25 mg via ORAL
  Filled 2020-04-06 (×14): qty 1

## 2020-04-06 MED ORDER — ENSURE ENLIVE PO LIQD
237.0000 mL | Freq: Three times a day (TID) | ORAL | Status: DC
  Administered 2020-04-06 – 2020-04-16 (×17): 237 mL via ORAL

## 2020-04-06 MED ORDER — METOPROLOL TARTRATE 5 MG/5ML IV SOLN: 5.0000 mg | Freq: Four times a day (QID) | INTRAVENOUS | Status: DC

## 2020-04-06 MED ORDER — GABAPENTIN 400 MG PO CAPS
400.0000 mg | ORAL_CAPSULE | Freq: Three times a day (TID) | ORAL | Status: DC
  Administered 2020-04-06 – 2020-04-11 (×15): 400 mg via ORAL
  Filled 2020-04-06 (×15): qty 1

## 2020-04-06 MED ORDER — METOPROLOL TARTRATE 5 MG/5ML IV SOLN: 5.0000 mg | Freq: Four times a day (QID) | INTRAVENOUS | Status: DC | PRN
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
## 2020-04-06 NOTE — Progress Notes (Signed)
Straight cath done with return of 600 cc of clear, yellow urine. Sterile procedure maintained throughout. Tolerated well.
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
## 2020-04-06 NOTE — Progress Notes (Signed)
Called to patients room to find patient agitated and upset. Stated, "Where have you been?! It's 8 o'clock and I haven't seen anybody yet! I've been calling up at the desk and I haven't seen anybody since 5 o'clock!" Don't you see your patients as soon as you get here?! Don't you get here at 7?! How dare you disrespect me like that!" Allowed to express feelings. Attempted to explain to patient that oncoming nurses must don PPE and receive report prior to assessing patients but patient insisted on talking over staff RN and refused to listen to anything the staff RN was attempting to explain. Asked patient several times what I could do for him to which he responded, "you can step out! I'm calling channel 2 right now!"

## 2020-04-06 NOTE — Progress Notes (Addendum)
Patient refused taking his scheduled ABILIFY tablet 2mg . Patient stated "makes him feel sick"
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
## 2020-04-06 NOTE — Progress Notes (Signed)
Straight cath done with return of 400 cc of clear, yellow urine. Sterile procedure maintained throughout. Tolerated well.

## 2020-04-07 ENCOUNTER — Inpatient Hospital Stay (HOSPITAL_COMMUNITY): Payer: Medicaid Other

## 2020-04-07 MED ORDER — MAGNESIUM CITRATE PO SOLN
0.5000 | Freq: Once | ORAL | Status: AC
  Administered 2020-04-07: 0.5 via ORAL
  Filled 2020-04-07: qty 296

## 2020-04-07 MED ORDER — MORPHINE SULFATE (PF) 2 MG/ML IV SOLN
2.0000 mg | Freq: Three times a day (TID) | INTRAVENOUS | Status: AC | PRN
  Administered 2020-04-07 – 2020-04-11 (×4): 2 mg via INTRAVENOUS
  Filled 2020-04-07 (×4): qty 1

## 2020-04-07 MED ORDER — ACETAMINOPHEN 500 MG PO TABS
1000.0000 mg | ORAL_TABLET | Freq: Three times a day (TID) | ORAL | Status: DC
  Administered 2020-04-07 – 2020-04-14 (×17): 1000 mg via ORAL
  Filled 2020-04-07 (×23): qty 2
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
## 2020-04-07 NOTE — Progress Notes (Signed)
Straight cath done with return of 500 cc of clear, yellow urine. Sterile procedure maintained throughout. Tolerated well.
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
## 2020-04-08 MED ORDER — LACTULOSE ENEMA
300.0000 mL | Freq: Once | ORAL | Status: AC
  Administered 2020-04-08: 300 mL via RECTAL
  Filled 2020-04-08: qty 300
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
## 2020-04-08 NOTE — Plan of Care (Signed)
  Problem: Education: Goal: Knowledge of General Education information will improve Description: Including pain rating scale, medication(s)/side effects and non-pharmacologic comfort measures Outcome: Progressing   Problem: Health Behavior/Discharge Planning: Goal: Ability to manage health-related needs will improve Outcome: Progressing   Problem: Clinical Measurements: Goal: Ability to maintain clinical measurements within normal limits will improve Outcome: Progressing Goal: Will remain free from infection Outcome: Progressing Goal: Diagnostic test results will improve Outcome: Progressing Goal: Respiratory complications will improve Outcome: Progressing Goal: Cardiovascular complication will be avoided Outcome: Progressing   Problem: Activity: Goal: Risk for activity intolerance will decrease Outcome: Progressing   Problem: Nutrition: Goal: Adequate nutrition will be maintained Outcome: Progressing   Problem: Coping: Goal: Level of anxiety will decrease Outcome: Progressing   Problem: Elimination: Goal: Will not experience complications related to bowel motility Outcome: Progressing Goal: Will not experience complications related to urinary retention Outcome: Progressing   Problem: Pain Managment: Goal: General experience of comfort will improve Outcome: Progressing   Problem: Safety: Goal: Ability to remain free from injury will improve Outcome: Progressing   Problem: Skin Integrity: Goal: Risk for impaired skin integrity will decrease Outcome: Progressing   Problem: Activity: Goal: Ability to tolerate increased activity will improve Outcome: Progressing   Problem: Respiratory: Goal: Ability to maintain a clear airway and adequate ventilation will improve Outcome: Progressing   Problem: Role Relationship: Goal: Method of communication will improve Outcome: Progressing   Problem: Education: Goal: Verbalization of understanding the information  provided will improve Outcome: Progressing   Problem: Coping: Goal: Ability to demonstrate appropriate behavior when angry will improve Outcome: Progressing Goal: Ability to identify and develop effective coping behavior will improve Outcome: Progressing Goal: Ability to interact with others will improve Outcome: Progressing   Problem: Health Behavior/Discharge Planning: Goal: Identification of resources available to assist in meeting health care needs will improve Outcome: Progressing

## 2020-04-08 NOTE — Plan of Care (Signed)
  Problem: Clinical Measurements: Goal: Respiratory complications will improve Outcome: Progressing   

## 2020-04-09 LAB — BASIC METABOLIC PANEL
Anion gap: 8 (ref 5–15)
BUN: 21 mg/dL — ABNORMAL HIGH (ref 6–20)
CO2: 30 mmol/L (ref 22–32)
Calcium: 9.4 mg/dL (ref 8.9–10.3)
Chloride: 97 mmol/L — ABNORMAL LOW (ref 98–111)
Creatinine, Ser: 0.99 mg/dL (ref 0.61–1.24)
GFR calc Af Amer: >60 mL/min (ref >60)
GFR calc non Af Amer: >60 mL/min (ref >60)
Glucose, Bld: 106 mg/dL — ABNORMAL HIGH (ref 70–99)
Potassium: 4.1 mmol/L (ref 3.5–5.1)
Sodium: 135 mmol/L (ref 135–145)

## 2020-04-09 MED ORDER — BACLOFEN 10 MG PO TABS
10.0000 mg | ORAL_TABLET | Freq: Three times a day (TID) | ORAL | Status: DC
  Administered 2020-04-09 – 2020-04-10 (×4): 10 mg via ORAL
  Filled 2020-04-09 (×4): qty 1

## 2020-04-09 MED ORDER — LACTULOSE ENEMA
300.0000 mL | Freq: Every day | ORAL | Status: DC | PRN
  Filled 2020-04-09: qty 300
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
## 2020-04-09 NOTE — Progress Notes (Addendum)
2706 - Patient requested to see charge nurse.  This Clinical research associate went to see patient.  Patient has been calling out multiple times during shift asking for various drinks and requests.  I have answered many of his calls from the desk.  All three nurses on the COVID side have been in the patient's room responding to his requests during the shift to respond promptly to his needs.  The NT has been in there multiple times as well.    Per staff, the patient has yelled and disrespected them.   They have all treated him professionally and respectfully.   This writer went into the patient's room and the patient wanted to know if I was the supervisor here.  I explained I was the charge nurse for tonight.  The patient stated he did not want to "speak to no nurse" and that he wanted to speak to my supervisor.  I explained that the supervisor was also a nurse and he told me I was lying.  Eventually he asked what the supervisor's name was and I said John.  He demanded that I get John in there "right now" and that a charge nurse was not what he needed.  I explained that the charge nurse was very busy at the moment but I would let him know.    Patient stated that his mom works at [employer] and immediately called her on speaker-phone. His mother tried to calm him down so he took her off speaker-phone and told me, "I am done with you and on the phone now. You can get out and get your supervisor in here."  The patient only said that everyone has been rude to him and not treating him with respect.  Once I told him I was the charge nurse on the floor he did not want anything else to do with me.    25-  Spoke with [facility] and his name is actually [place].  Kathlene November is aware of the patient's request to speak to him.  As I told the patient, the [facility] is extremely busy at this time of the morning and it may not be realistic for the [facility] to meet him.    I will pass on the concerns to the day shift charge nurse and will make sure administration (Director  or AD) for 5W discusses his concerns this morning with him.
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
## 2020-04-09 NOTE — Plan of Care (Signed)
  Problem: Clinical Measurements: Goal: Respiratory complications will improve Outcome: Progressing   
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
## 2020-04-09 NOTE — Plan of Care (Signed)
  Problem: Education: Goal: Knowledge of General Education information will improve Description: Including pain rating scale, medication(s)/side effects and non-pharmacologic comfort measures Outcome: Progressing   Problem: Health Behavior/Discharge Planning: Goal: Ability to manage health-related needs will improve Outcome: Progressing   Problem: Clinical Measurements: Goal: Ability to maintain clinical measurements within normal limits will improve Outcome: Progressing Goal: Will remain free from infection Outcome: Progressing Goal: Diagnostic test results will improve Outcome: Progressing Goal: Respiratory complications will improve Outcome: Progressing Goal: Cardiovascular complication will be avoided Outcome: Progressing   Problem: Activity: Goal: Risk for activity intolerance will decrease Outcome: Progressing   Problem: Nutrition: Goal: Adequate nutrition will be maintained Outcome: Progressing   Problem: Coping: Goal: Level of anxiety will decrease Outcome: Progressing   Problem: Elimination: Goal: Will not experience complications related to bowel motility Outcome: Progressing Goal: Will not experience complications related to urinary retention Outcome: Progressing   Problem: Pain Managment: Goal: General experience of comfort will improve Outcome: Progressing   Problem: Safety: Goal: Ability to remain free from injury will improve Outcome: Progressing   Problem: Skin Integrity: Goal: Risk for impaired skin integrity will decrease Outcome: Progressing   Problem: Activity: Goal: Ability to tolerate increased activity will improve Outcome: Progressing   Problem: Respiratory: Goal: Ability to maintain a clear airway and adequate ventilation will improve Outcome: Progressing   Problem: Role Relationship: Goal: Method of communication will improve Outcome: Progressing   Problem: Education: Goal: Verbalization of understanding the information  provided will improve Outcome: Progressing   Problem: Coping: Goal: Ability to demonstrate appropriate behavior when angry will improve Outcome: Progressing Goal: Ability to identify and develop effective coping behavior will improve Outcome: Progressing Goal: Ability to interact with others will improve Outcome: Progressing   Problem: Health Behavior/Discharge Planning: Goal: Identification of resources available to assist in meeting health care needs will improve Outcome: Progressing   

## 2020-04-10 ENCOUNTER — Inpatient Hospital Stay (HOSPITAL_COMMUNITY): Payer: Medicaid Other

## 2020-04-10 MED ORDER — DIPHENHYDRAMINE-ZINC ACETATE 2-0.1 % EX CREA
1.0000 "application " | TOPICAL_CREAM | Freq: Two times a day (BID) | CUTANEOUS | Status: DC | PRN
  Administered 2020-04-10 – 2020-04-13 (×2): 1 via TOPICAL
  Filled 2020-04-10: qty 28

## 2020-04-10 MED ORDER — BACLOFEN 10 MG PO TABS
20.0000 mg | ORAL_TABLET | Freq: Three times a day (TID) | ORAL | Status: DC
  Administered 2020-04-10 – 2020-04-16 (×18): 20 mg via ORAL
  Filled 2020-04-10 (×3): qty 2
  Filled 2020-04-10: qty 1
  Filled 2020-04-10 (×5): qty 2
  Filled 2020-04-10: qty 1
  Filled 2020-04-10 (×3): qty 2
  Filled 2020-04-10: qty 1
  Filled 2020-04-10 (×3): qty 2
  Filled 2020-04-10: qty 1

## 2020-04-10 NOTE — Plan of Care (Signed)
  Problem: Clinical Measurements: Goal: Respiratory complications will improve Outcome: Progressing   
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
## 2020-04-10 NOTE — Progress Notes (Signed)
Spoke with patient about concerns today. Patient sitting upright in bed, calm on my arrival. He expressed many concerns which I attempted to address. He is still concerned about his covid test result and stated "don't worry about it, my lawyer is coming on Monday and will handle all of this". He did say his back pain was better and his nurse was addressing his pain appropriately. He spoke highly of the nursing staff and apologized to me for outbursts earlier today. He did want me to look at spots on his back that were bothering him, and I noted a few small red bumps. He stated they itched and were uncomfortable.  I did speak to Dr Bedelia Person about patient and she will order topical cream for back.  Trauma Response Nurse [phone]

## 2020-04-10 NOTE — Progress Notes (Signed)
Pt visibly upset today.  I asked pt what was wrong today and he stated " I don't think I have COVID, I would like to see my test results.  Pt was instructed that he can gather the information from [place].  I helped pt set up his mychart account.  However, pt's mychart didn't have the recent updated information in his chart.  Pt was looking and saying oh this right here say it's no record of me having got tested for COVID.  At that time the PA-C was also in the room, and she also told the patient that his results were positive.  He then stated "oh what yall see I can't interpret that because I didn't go to school for that like ya'll did."  Pt was asked what would you like me to do so I can lessen your frustration.  Pt stated he wanted to talk to the charge nurse.  Delia Chimes Nurse spoke to the patient in regards to his concerns and she attempted to resolve them, but however was unsucessful.    1415-  Pt called and said his back was hurting.  Pt medicated with 15 mg of oxycodone.  He stated, "my back feels like something is wrong with it."  I rolled him on his side and assessed his back.  The assessment was negative.  I then told the pt that I would contact the MD so they can come assess him.  He said okay.  Dr. Bedelia Person returned a paged and said she would come see him once she's done with her procedure.  Pt then got upset because he didn't get a food tray.  Pt was asked if he liked [employer].  Pt didn't answer, but said "ya'll are messing up today."  I asked him what would he like to eat and what can I do to resolve all his issues and concerns today.  Pt declined and asked to speak to the charge nurse again.  I told Azalee Course that he would like to talk to her once again and she stated she would talk to pt about his issues.

## 2020-04-11 MED ORDER — GABAPENTIN 300 MG PO CAPS
600.0000 mg | ORAL_CAPSULE | Freq: Three times a day (TID) | ORAL | Status: DC
  Administered 2020-04-11 – 2020-04-16 (×16): 600 mg via ORAL
  Filled 2020-04-11 (×16): qty 2

## 2020-04-11 NOTE — Progress Notes (Signed)
Patient transferred to [phone]. VSS. No distress. All belongings transferred with patient. Report given to [facility]. Care relinquished at this time.
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
## 2020-04-12 DIAGNOSIS — S24103S Unspecified injury at T7-T10 level of thoracic spinal cord, sequela: Secondary | ICD-10-CM
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
## 2020-04-12 NOTE — TOC Progression Note (Signed)
Transition of Care [facility]) - Progression Note    Patient Details  Name: [name] MRN: [id number] Date of Birth: 15-Dec-1990  Transition of Care [facility]) CM/SW Contact  Ella Bodo, RN Phone Number: 04/12/2020, 5:19 PM  Clinical Narrative: Met with patient to discuss discharge planning:  Patient states he wants to go to CIR, and that his mother will take care of him at discharge.  He states that he was living with "his girl", but she moved back home, and let their apartment go.  Mom currently living with his sister, but he states he cannot move in with them because there is no room.  He does state that his mom is willing to move to a ground floor handicapped apartment, and would take time off to care for him.  He does give me permission to call his mom to discuss care issues with her.  Pt concerned about disability and Medicaid; I have emailed financial counselor to follow up with patient to discuss his concerns.  Pt appreciative of my visit; will follow up with mother [name] [phone] to confirm availability at dc.        Expected Discharge Plan: IP Rehab Facility Barriers to Discharge: Continued Medical Work up  Expected Discharge Plan and Services Expected Discharge Plan: [place]   Discharge Planning Services: CM Consult Post Acute Care Choice: IP Rehab Living arrangements for the past 2 months: Single Family Home                                       Social Determinants of Health (SDOH) Interventions    Readmission Risk Interventions No flowsheet data found.  Reinaldo Raddle, RN, BSN  Trauma/Neuro ICU Case Manager [phone]
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
## 2020-04-12 NOTE — Plan of Care (Signed)
  Problem: Clinical Measurements: Goal: Respiratory complications will improve Outcome: Progressing   Problem: Elimination: Goal: Will not experience complications related to bowel motility Outcome: Progressing   Problem: Pain Managment: Goal: General experience of comfort will improve Outcome: Progressing   
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
## 2020-04-13 ENCOUNTER — Inpatient Hospital Stay (HOSPITAL_COMMUNITY): Payer: Medicaid Other

## 2020-04-13 ENCOUNTER — Encounter (HOSPITAL_COMMUNITY): Payer: Self-pay | Admitting: General Surgery

## 2020-04-13 MED ORDER — METOPROLOL TARTRATE 25 MG PO TABS
12.5000 mg | ORAL_TABLET | Freq: Two times a day (BID) | ORAL | Status: DC
  Administered 2020-04-13 – 2020-04-16 (×7): 12.5 mg via ORAL
  Filled 2020-04-13 (×7): qty 1
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
## 2020-04-13 NOTE — Progress Notes (Signed)
[name] detention center needs to be notified if there is an order for discharged. Her phone number-[phone]

## 2020-04-13 NOTE — Progress Notes (Signed)
Patient's VS rechecked for yellow MEWS. Patient was on the phone while VS were being taken. Upon recheck patient is stable and no interventions are needed.   04/13/20 0040  Assess: MEWS Score  Temp 99.4 F (37.4 C)  BP 120/86  Pulse Rate 81  Resp 16  SpO2 100 %  O2 Device Room Air  Assess: MEWS Score  MEWS Temp 0  MEWS Systolic 0  MEWS Pulse 0  MEWS RR 0  MEWS LOC 0  MEWS Score 0  MEWS Score Color [place] Si

## 2020-04-13 NOTE — Progress Notes (Signed)
Inpatient Rehab Admissions:  Inpatient Rehab Consult received.  I met with patient at the bedside for rehabilitation assessment and to discuss goals and expectations of an inpatient rehab admission.  Will need to confirm dispo prior to potential admission. Will continue to follow.   Signed: Shann Medal, PT, DPT Admissions Coordinator [phone] 04/13/20  4:55 PM
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
## 2020-04-14 MED ORDER — MORPHINE SULFATE (PF) 4 MG/ML IV SOLN
4.0000 mg | Freq: Once | INTRAVENOUS | Status: AC
  Administered 2020-04-14: 4 mg via INTRAVENOUS
  Filled 2020-04-14: qty 1

## 2020-04-14 MED ORDER — HYDROXYZINE HCL 25 MG PO TABS
50.0000 mg | ORAL_TABLET | Freq: Three times a day (TID) | ORAL | Status: DC | PRN
  Administered 2020-04-14: 50 mg via ORAL
  Filled 2020-04-14: qty 2

## 2020-04-14 MED ORDER — LIDOCAINE 5 % EX PTCH
1.0000 | MEDICATED_PATCH | CUTANEOUS | Status: DC
  Administered 2020-04-14 – 2020-04-16 (×3): 1 via TRANSDERMAL
  Filled 2020-04-14 (×3): qty 1
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
## 2020-04-14 NOTE — Progress Notes (Signed)
Sterile straight cath performed. Return output 200 mL of yellow urine.

## 2020-04-14 NOTE — Progress Notes (Signed)
Inpatient Rehab Admissions Coordinator:   Discussed with [name], CM.  Pt to be discharged to [facility] re[facility] facility when medically ready.  [facility] will sign off at this time.    Estill Dooms, PT, DPT Admissions Coordinator [phone] 04/14/20  1:03 PM
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
## 2020-04-14 NOTE — Care Management (Cosign Needed)
    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  A walker will not resolve  issue with performing activities of daily living. A wheelchair will allow patient to safely perform daily activities. Patient is not able to propel themselves in the home using a standard weight wheelchair due to paralysis. Patient can self propel in the lightweight wheelchair. Length of need lifetime. Accessories: elevating leg rests (ELRs), wheel locks, extensions and anti-tippers.   04/14/20 1459
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
## 2020-04-14 NOTE — Progress Notes (Signed)
0030- After completing straight cath patient stated he wanted a bath and was also in pain so this writer went to go get pain medication and called nurse tech to help assist with bathing. Nurse tech stated that she just went on break and soon after break she would come and help bath patient. Nurse writing went to administer Tramadol at 0050 and relayed message to patient that as soon as nurse tech comes off of break we will come to give bath. Patient stated ok. Nurse writing also asked to remove disposable bed pad from underneath patient since it was wet but patient refused and said he will just wait.    4680- Nurse writing and nurse tech both had new admission that came up to the floor. Nurse tech completed new patient vital signs at 0141 and left room shortly after stating she was going to see this patient to start bath. This nurse writing stated to nurse tech she will be over to help as soon as new patient admission/assessment is completed.   0200 - This nurse was approached by charge nurse stating that patient wanted to speak to someone in charge and that she was heading to patients room because patient was upset. Nurse writing was still assisting new admission. Charge nurse came back and stated that she was switching nurse techs because patient did not want nurse tech giving him a bath. Per patient "he felt disrespected and said that it took 2 hours for nurse tech to come give bath".  Charge nurse continued to deescalate situation and helped assist the new nurse tech with patient bath.   0300- This Clinical research associate and another nurse went to patients room to speak with patient about situation. Patient apologized to nurse with this writer because he assumed that she was laughing at him over the speaker when he called to ask for his nurse. Nurse stated to patient that she was not laughing at him and she only came to room to ask what he said because she could not understand him over the speaker.

## 2020-04-15 ENCOUNTER — Inpatient Hospital Stay (HOSPITAL_COMMUNITY): Payer: Medicaid Other
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
## 2020-04-15 NOTE — Progress Notes (Signed)
Received a call form [facility] facility from [place] RN  [phone] with information that patient is scheduled to face the Sharee Pimple via  video conference today at 11am. 3 officers will be here at that time to set up the laptop. Nurse requested for patient not to be scheduled for procedures around that time if possible
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
## 2020-04-15 NOTE — Progress Notes (Signed)
Patient requested bath, NT in patient room assisting with bath encouraging pt to participate in hygiene. Pt got upset and started cursing and yelling at staff. This nurse assisted with bathing pt. Pt was not satisfied with bath. Pt requested to do it himself and have the supplies set up on bedside table. Pt called requesting for charge nurse.

## 2020-04-15 NOTE — Plan of Care (Signed)
  Problem: Nutrition: Goal: Adequate nutrition will be maintained Outcome: Progressing   Problem: Pain Managment: Goal: General experience of comfort will improve Outcome: Progressing   Problem: Activity: Goal: Risk for activity intolerance will decrease Outcome: Not Progressing   

## 2020-04-15 NOTE — Progress Notes (Signed)
Patient suffers from T10 and T11vertebral body fractures with bilateral lower extremity paralysis which impairs their ability to perform daily activities like walking in the home.  A walker will not resolve  issue with performing activities of daily living. A wheelchair will allow patient to safely perform daily activities. Patient is not able to propel themselves in the home using a standard weight wheelchair due to paralysis. Patient can self propel in the lightweight wheelchair. Length of need lifetime. Accessories: elevating leg rests (ELRs), wheel locks, extensions and anti-tippers.
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
## 2020-04-16 ENCOUNTER — Inpatient Hospital Stay (HOSPITAL_COMMUNITY)
Admission: RE | Admit: 2020-04-16 | Discharge: 2020-04-28 | DRG: 052 | Disposition: A | Discharge status: Home / Self Care | Payer: Medicaid Other | Source: Intra-hospital | Attending: Physical Medicine and Rehabilitation | Admitting: Physical Medicine and Rehabilitation

## 2020-04-16 ENCOUNTER — Other Ambulatory Visit: Payer: Self-pay

## 2020-04-16 ENCOUNTER — Encounter (HOSPITAL_COMMUNITY): Payer: Self-pay | Admitting: Physical Medicine and Rehabilitation

## 2020-04-16 DIAGNOSIS — F4311 Post-traumatic stress disorder, acute: Secondary | ICD-10-CM | POA: Diagnosis present

## 2020-04-16 DIAGNOSIS — R42 Dizziness and giddiness: Secondary | ICD-10-CM | POA: Diagnosis not present

## 2020-04-16 DIAGNOSIS — Z8616 Personal history of COVID-19: Secondary | ICD-10-CM

## 2020-04-16 DIAGNOSIS — S24109A Unspecified injury at unspecified level of thoracic spinal cord, initial encounter: Secondary | ICD-10-CM | POA: Diagnosis present

## 2020-04-16 DIAGNOSIS — G4700 Insomnia, unspecified: Secondary | ICD-10-CM | POA: Diagnosis present

## 2020-04-16 DIAGNOSIS — K592 Neurogenic bowel, not elsewhere classified: Secondary | ICD-10-CM

## 2020-04-16 DIAGNOSIS — F4320 Adjustment disorder, unspecified: Secondary | ICD-10-CM | POA: Diagnosis present

## 2020-04-16 DIAGNOSIS — D62 Acute posthemorrhagic anemia: Secondary | ICD-10-CM

## 2020-04-16 DIAGNOSIS — S22089D Unspecified fracture of T11-T12 vertebra, subsequent encounter for fracture with routine healing: Secondary | ICD-10-CM

## 2020-04-16 DIAGNOSIS — Z5329 Procedure and treatment not carried out because of patient's decision for other reasons: Secondary | ICD-10-CM | POA: Diagnosis not present

## 2020-04-16 DIAGNOSIS — R451 Restlessness and agitation: Secondary | ICD-10-CM | POA: Diagnosis not present

## 2020-04-16 DIAGNOSIS — G8220 Paraplegia, unspecified: Secondary | ICD-10-CM

## 2020-04-16 DIAGNOSIS — W3400XD Accidental discharge from unspecified firearms or gun, subsequent encounter: Secondary | ICD-10-CM

## 2020-04-16 DIAGNOSIS — D72829 Elevated white blood cell count, unspecified: Secondary | ICD-10-CM | POA: Diagnosis present

## 2020-04-16 DIAGNOSIS — Z818 Family history of other mental and behavioral disorders: Secondary | ICD-10-CM

## 2020-04-16 DIAGNOSIS — R252 Cramp and spasm: Secondary | ICD-10-CM

## 2020-04-16 DIAGNOSIS — B9620 Unspecified Escherichia coli [E. coli] as the cause of diseases classified elsewhere: Secondary | ICD-10-CM | POA: Diagnosis present

## 2020-04-16 DIAGNOSIS — I10 Essential (primary) hypertension: Secondary | ICD-10-CM | POA: Diagnosis present

## 2020-04-16 DIAGNOSIS — S24113D Complete lesion at T7-T10 level of thoracic spinal cord, subsequent encounter: Secondary | ICD-10-CM

## 2020-04-16 DIAGNOSIS — F4322 Adjustment disorder with anxiety: Secondary | ICD-10-CM | POA: Diagnosis present

## 2020-04-16 DIAGNOSIS — F1290 Cannabis use, unspecified, uncomplicated: Secondary | ICD-10-CM | POA: Diagnosis present

## 2020-04-16 DIAGNOSIS — H00016 Hordeolum externum left eye, unspecified eyelid: Secondary | ICD-10-CM | POA: Diagnosis present

## 2020-04-16 DIAGNOSIS — F209 Schizophrenia, unspecified: Secondary | ICD-10-CM | POA: Diagnosis present

## 2020-04-16 DIAGNOSIS — G8221 Paraplegia, complete: Secondary | ICD-10-CM | POA: Diagnosis present

## 2020-04-16 DIAGNOSIS — N3289 Other specified disorders of bladder: Secondary | ICD-10-CM | POA: Diagnosis present

## 2020-04-16 DIAGNOSIS — W3400XA Accidental discharge from unspecified firearms or gun, initial encounter: Secondary | ICD-10-CM

## 2020-04-16 DIAGNOSIS — N319 Neuromuscular dysfunction of bladder, unspecified: Secondary | ICD-10-CM

## 2020-04-16 DIAGNOSIS — N390 Urinary tract infection, site not specified: Secondary | ICD-10-CM | POA: Diagnosis present

## 2020-04-16 LAB — BASIC METABOLIC PANEL
Anion gap: 12 (ref 5–15)
BUN: 23 mg/dL — ABNORMAL HIGH (ref 6–20)
CO2: 25 mmol/L (ref 22–32)
Calcium: 9.3 mg/dL (ref 8.9–10.3)
Chloride: 99 mmol/L (ref 98–111)
Creatinine, Ser: 0.98 mg/dL (ref 0.61–1.24)
GFR calc Af Amer: >60 mL/min (ref >60)
GFR calc non Af Amer: >60 mL/min (ref >60)
Glucose, Bld: 138 mg/dL — ABNORMAL HIGH (ref 70–99)
Potassium: 4.2 mmol/L (ref 3.5–5.1)
Sodium: 136 mmol/L (ref 135–145)

## 2020-04-16 MED ORDER — BISACODYL 10 MG RE SUPP
10.0000 mg | Freq: Every day | RECTAL | Status: DC
  Administered 2020-04-17 – 2020-04-27 (×10): 10 mg via RECTAL
  Filled 2020-04-16 (×14): qty 1

## 2020-04-16 MED ORDER — BACLOFEN 20 MG PO TABS
20.0000 mg | ORAL_TABLET | Freq: Three times a day (TID) | ORAL | Status: DC
  Administered 2020-04-16 – 2020-04-28 (×35): 20 mg via ORAL
  Filled 2020-04-16 (×4): qty 1
  Filled 2020-04-16: qty 2
  Filled 2020-04-16 (×30): qty 1

## 2020-04-16 MED ORDER — PROCHLORPERAZINE MALEATE 5 MG PO TABS
5.0000 mg | ORAL_TABLET | Freq: Four times a day (QID) | ORAL | Status: DC | PRN
  Administered 2020-04-17 – 2020-04-19 (×2): 10 mg via ORAL
  Filled 2020-04-16 (×2): qty 2

## 2020-04-16 MED ORDER — DIPHENHYDRAMINE HCL 12.5 MG/5ML PO ELIX
12.5000 mg | ORAL_SOLUTION | Freq: Four times a day (QID) | ORAL | Status: DC | PRN
  Administered 2020-04-21: 25 mg via ORAL
  Filled 2020-04-16: qty 10

## 2020-04-16 MED ORDER — NAPHAZOLINE-GLYCERIN 0.012-0.2 % OP SOLN
1.0000 [drp] | Freq: Three times a day (TID) | OPHTHALMIC | Status: DC
  Administered 2020-04-16 – 2020-04-17 (×6): 2 [drp] via OPHTHALMIC
  Administered 2020-04-18: 1 [drp] via OPHTHALMIC
  Administered 2020-04-20 – 2020-04-23 (×4): 2 [drp] via OPHTHALMIC
  Administered 2020-04-24: 1 [drp] via OPHTHALMIC
  Administered 2020-04-24: 2 [drp] via OPHTHALMIC
  Administered 2020-04-24 – 2020-04-25 (×4): 1 [drp] via OPHTHALMIC
  Administered 2020-04-26 – 2020-04-28 (×8): 2 [drp] via OPHTHALMIC
  Filled 2020-04-16: qty 15

## 2020-04-16 MED ORDER — GUAIFENESIN-DM 100-10 MG/5ML PO SYRP: 5.0000 mL | ORAL_SOLUTION | Freq: Four times a day (QID) | ORAL | Status: DC | PRN

## 2020-04-16 MED ORDER — TRAZODONE HCL 50 MG PO TABS
50.0000 mg | ORAL_TABLET | Freq: Every evening | ORAL | Status: DC | PRN
  Administered 2020-04-21: 50 mg via ORAL
  Filled 2020-04-16 (×3): qty 1

## 2020-04-16 MED ORDER — DOCUSATE SODIUM 100 MG PO CAPS
100.0000 mg | ORAL_CAPSULE | Freq: Two times a day (BID) | ORAL | Status: DC
  Administered 2020-04-17: 100 mg via ORAL
  Filled 2020-04-16 (×2): qty 1

## 2020-04-16 MED ORDER — LIDOCAINE HCL URETHRAL/MUCOSAL 2 % EX GEL: CUTANEOUS | Status: DC | PRN

## 2020-04-16 MED ORDER — HYDROXYZINE HCL 25 MG PO TABS
50.0000 mg | ORAL_TABLET | Freq: Three times a day (TID) | ORAL | Status: DC | PRN
  Filled 2020-04-16 (×2): qty 2

## 2020-04-16 MED ORDER — CHLORHEXIDINE GLUCONATE 0.12% ORAL RINSE (MEDLINE KIT)
15.0000 mL | Freq: Two times a day (BID) | OROMUCOSAL | Status: DC
  Administered 2020-04-16: 15 mL via OROMUCOSAL

## 2020-04-16 MED ORDER — TRAMADOL HCL 50 MG PO TABS
100.0000 mg | ORAL_TABLET | Freq: Four times a day (QID) | ORAL | Status: DC
  Administered 2020-04-16 – 2020-04-28 (×46): 100 mg via ORAL
  Filled 2020-04-16 (×48): qty 2

## 2020-04-16 MED ORDER — PROCHLORPERAZINE 25 MG RE SUPP: 12.5000 mg | Freq: Four times a day (QID) | RECTAL | Status: DC | PRN

## 2020-04-16 MED ORDER — POLYETHYLENE GLYCOL 3350 17 G PO PACK: 17.0000 g | PACK | Freq: Every day | ORAL | Status: DC | PRN

## 2020-04-16 MED ORDER — GABAPENTIN 300 MG PO CAPS
600.0000 mg | ORAL_CAPSULE | Freq: Three times a day (TID) | ORAL | Status: DC
  Administered 2020-04-16 – 2020-04-20 (×10): 600 mg via ORAL
  Filled 2020-04-16 (×11): qty 2

## 2020-04-16 MED ORDER — ZOLPIDEM TARTRATE 5 MG PO TABS
5.0000 mg | ORAL_TABLET | Freq: Every day | ORAL | Status: DC
  Administered 2020-04-17 – 2020-04-27 (×4): 5 mg via ORAL
  Filled 2020-04-16 (×9): qty 1

## 2020-04-16 MED ORDER — FLEET ENEMA 7-19 GM/118ML RE ENEM: 1.0000 | ENEMA | Freq: Once | RECTAL | Status: DC | PRN

## 2020-04-16 MED ORDER — OXYCODONE HCL 5 MG PO TABS
10.0000 mg | ORAL_TABLET | ORAL | Status: DC | PRN
  Administered 2020-04-16 – 2020-04-18 (×9): 15 mg via ORAL
  Administered 2020-04-19: 10 mg via ORAL
  Administered 2020-04-19 – 2020-04-28 (×36): 15 mg via ORAL
  Filled 2020-04-16 (×46): qty 3

## 2020-04-16 MED ORDER — DIPHENHYDRAMINE-ZINC ACETATE 2-0.1 % EX CREA: 1.0000 "application " | TOPICAL_CREAM | Freq: Two times a day (BID) | CUTANEOUS | Status: DC | PRN

## 2020-04-16 MED ORDER — PROCHLORPERAZINE EDISYLATE 10 MG/2ML IJ SOLN: 5.0000 mg | Freq: Four times a day (QID) | INTRAMUSCULAR | Status: DC | PRN

## 2020-04-16 MED ORDER — ALUM & MAG HYDROXIDE-SIMETH 200-200-20 MG/5ML PO SUSP: 30.0000 mL | ORAL | Status: DC | PRN

## 2020-04-16 MED ORDER — ACETAMINOPHEN 325 MG PO TABS
325.0000 mg | ORAL_TABLET | ORAL | Status: DC | PRN
  Administered 2020-04-22 – 2020-04-24 (×3): 650 mg via ORAL
  Filled 2020-04-16 (×4): qty 2

## 2020-04-16 MED ORDER — METOPROLOL TARTRATE 12.5 MG HALF TABLET
12.5000 mg | ORAL_TABLET | Freq: Two times a day (BID) | ORAL | Status: DC
  Administered 2020-04-16 – 2020-04-18 (×5): 12.5 mg via ORAL
  Filled 2020-04-16 (×7): qty 1

## 2020-04-16 MED ORDER — ENSURE ENLIVE PO LIQD
237.0000 mL | Freq: Three times a day (TID) | ORAL | Status: DC
  Administered 2020-04-18 – 2020-04-28 (×14): 237 mL via ORAL

## 2020-04-16 MED ORDER — ENOXAPARIN SODIUM 30 MG/0.3ML ~~LOC~~ SOLN
30.0000 mg | Freq: Two times a day (BID) | SUBCUTANEOUS | Status: DC
  Administered 2020-04-16 – 2020-04-28 (×23): 30 mg via SUBCUTANEOUS
  Filled 2020-04-16 (×24): qty 0.3

## 2020-04-16 MED ORDER — LIDOCAINE 5 % EX PTCH
2.0000 | MEDICATED_PATCH | CUTANEOUS | Status: DC
  Administered 2020-04-17 – 2020-04-24 (×5): 2 via TRANSDERMAL
  Filled 2020-04-16 (×9): qty 2

## 2020-04-16 NOTE — Progress Notes (Signed)
Patient ID: [name], male   DOB: 03/24/1991, 29 y.o.   MRN: [id number] Patient was admitted to 4W11 via bed, escorted by nursing staff.  Patient verbalized understanding of rehab process including fall prevention policy, personal belongings policy, and visitation policy.  Patient appears to be in no immediate distress at this time.  Dani Gobble, RN
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
## 2020-04-16 NOTE — Progress Notes (Addendum)
Inpatient Rehab Admissions Coordinator:   I have a bed available for pt to admit to CIR today.  Per trauma note, medically stable, but await final confirmation.  I discussed with pt/family and TOC team.   Addendum: Approval from [employer], PA-C.    Estill Dooms, PT, DPT Admissions Coordinator [phone] 04/16/20  10:44 AM
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
## 2020-04-16 NOTE — TOC Transition Note (Signed)
Transition of Care [facility]) - CM/SW Discharge Note   Patient Details  Name: [name] MRN: [id number] Date of Birth: 02/15/1991  Transition of Care [facility]) CM/SW Contact:  Glennon Mac, RN Phone Number: 04/16/2020, 12:02 PM   Clinical Narrative:  Pt medically stable for discharge and has been accepted for admission to [facility] IP Rehab today. Plan dc to CIR when bed available.      Final next level of care: IP Rehab Facility Barriers to Discharge: Barriers Resolved   Patient Goals and CMS Choice Patient states their goals for this hospitalization and ongoing recovery are:: Pt is highly motivated to  regain as much function as possible.   Choice offered to / list presented to : Patient  Discharge Placement                       Discharge Plan and Services   Discharge Planning Services: CM Consult Post Acute Care Choice: IP Rehab                               Social Determinants of Health (SDOH) Interventions     Readmission Risk Interventions No flowsheet data found.  Quintella Baton, RN, BSN  Trauma/Neuro ICU Case Manager [phone]
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
## 2020-04-16 NOTE — Plan of Care (Signed)
  Problem: Education: Goal: Knowledge of General Education information will improve Description Including pain rating scale, medication(s)/side effects and non-pharmacologic comfort measures Outcome: Progressing   
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
## 2020-04-17 ENCOUNTER — Inpatient Hospital Stay (HOSPITAL_COMMUNITY): Payer: Self-pay | Admitting: Physical Therapy

## 2020-04-17 ENCOUNTER — Inpatient Hospital Stay (HOSPITAL_COMMUNITY): Payer: Medicaid Other

## 2020-04-17 ENCOUNTER — Inpatient Hospital Stay (HOSPITAL_COMMUNITY): Payer: Self-pay | Admitting: Occupational Therapy

## 2020-04-17 DIAGNOSIS — G8220 Paraplegia, unspecified: Secondary | ICD-10-CM

## 2020-04-17 DIAGNOSIS — S24109S Unspecified injury at unspecified level of thoracic spinal cord, sequela: Secondary | ICD-10-CM

## 2020-04-17 LAB — COMPREHENSIVE METABOLIC PANEL
ALT: 49 U/L — ABNORMAL HIGH (ref 0–44)
AST: 29 U/L (ref 15–41)
Albumin: 3.1 g/dL — ABNORMAL LOW (ref 3.5–5.0)
Alkaline Phosphatase: 77 U/L (ref 38–126)
Anion gap: 10 (ref 5–15)
BUN: 23 mg/dL — ABNORMAL HIGH (ref 6–20)
CO2: 27 mmol/L (ref 22–32)
Calcium: 9.4 mg/dL (ref 8.9–10.3)
Chloride: 99 mmol/L (ref 98–111)
Creatinine, Ser: 1.09 mg/dL (ref 0.61–1.24)
GFR calc Af Amer: >60 mL/min (ref >60)
GFR calc non Af Amer: >60 mL/min (ref >60)
Glucose, Bld: 97 mg/dL (ref 70–99)
Potassium: 4.7 mmol/L (ref 3.5–5.1)
Sodium: 136 mmol/L (ref 135–145)
Total Bilirubin: 0.6 mg/dL (ref 0.3–1.2)
Total Protein: 7.1 g/dL (ref 6.5–8.1)

## 2020-04-17 LAB — CBC WITH DIFFERENTIAL/PLATELET
Abs Immature Granulocytes: 0.06 10*3/uL (ref 0.00–0.07)
Basophils Absolute: 0.1 10*3/uL (ref 0.0–0.1)
Basophils Relative: 1 %
Eosinophils Absolute: 0.2 10*3/uL (ref 0.0–0.5)
Eosinophils Relative: 2 %
HCT: 37.1 % — ABNORMAL LOW (ref 39.0–52.0)
Hemoglobin: 11.6 g/dL — ABNORMAL LOW (ref 13.0–17.0)
Immature Granulocytes: 1 %
Lymphocytes Relative: 20 %
Lymphs Abs: 1.8 10*3/uL (ref 0.7–4.0)
MCH: 25.8 pg — ABNORMAL LOW (ref 26.0–34.0)
MCHC: 31.3 g/dL (ref 30.0–36.0)
MCV: 82.6 fL (ref 80.0–100.0)
Monocytes Absolute: 1 10*3/uL (ref 0.1–1.0)
Monocytes Relative: 11 %
Neutro Abs: 5.6 10*3/uL (ref 1.7–7.7)
Neutrophils Relative %: 65 %
Platelets: 732 10*3/uL — ABNORMAL HIGH (ref 150–400)
RBC: 4.49 MIL/uL (ref 4.22–5.81)
RDW: 14.9 % (ref 11.5–15.5)
WBC: 8.7 10*3/uL (ref 4.0–10.5)
nRBC: 0 % (ref 0.0–0.2)

## 2020-04-17 MED ORDER — SENNA 8.6 MG PO TABS
2.0000 | ORAL_TABLET | Freq: Every day | ORAL | Status: DC
  Administered 2020-04-17 – 2020-04-27 (×8): 17.2 mg via ORAL
  Filled 2020-04-17 (×9): qty 2

## 2020-04-17 MED ORDER — DOCUSATE SODIUM 100 MG PO CAPS
100.0000 mg | ORAL_CAPSULE | Freq: Every day | ORAL | Status: DC
  Administered 2020-04-22 – 2020-04-28 (×7): 100 mg via ORAL
  Filled 2020-04-17 (×9): qty 1

## 2020-04-17 NOTE — Progress Notes (Signed)
VASCULAR LAB PRELIMINARY  PRELIMINARY  PRELIMINARY  PRELIMINARY  Bilateral lower extremity venous duplex completed.    Preliminary report:  See CV proc for preliminary results.   Lanaya Bennis, RVT 04/17/2020, 3:56 PM

## 2020-04-17 NOTE — Plan of Care (Signed)
  Problem: RH Grooming Goal: LTG Patient will perform grooming w/assist,cues/equip (OT) Description: LTG: Patient will perform grooming with assist, with/without cues using equipment (OT) Flowsheets (Taken 04/17/2020 1609) LTG: Pt will perform grooming with assistance level of: Independent with assistive device    Problem: RH Bathing Goal: LTG Patient will bathe all body parts with assist levels (OT) Description: LTG: Patient will bathe all body parts with assist levels (OT) Flowsheets (Taken 04/17/2020 1609) LTG: Pt will perform bathing with assistance level/cueing: Set up assist    Problem: RH Dressing Goal: LTG Patient will perform upper body dressing (OT) Description: LTG Patient will perform upper body dressing with assist, with/without cues (OT). Flowsheets (Taken 04/17/2020 1609) LTG: Pt will perform upper body dressing with assistance level of:  Independent with assistive device  Including orthosis Goal: LTG Patient will perform lower body dressing w/assist (OT) Description: LTG: Patient will perform lower body dressing with assist, with/without cues in positioning using equipment (OT) Flowsheets (Taken 04/17/2020 1609) LTG: Pt will perform lower body dressing with assistance level of: Independent with assistive device   Problem: RH Toileting Goal: LTG Patient will perform toileting task (3/3 steps) with assistance level (OT) Description: LTG: Patient will perform toileting task (3/3 steps) with assistance level (OT)  Flowsheets (Taken 04/17/2020 1609) LTG: Pt will perform toileting task (3/3 steps) with assistance level: Independent with assistive device   Problem: RH Toilet Transfers Goal: LTG Patient will perform toilet transfers w/assist (OT) Description: LTG: Patient will perform toilet transfers with assist, with/without cues using equipment (OT) Flowsheets (Taken 04/17/2020 1609) LTG: Pt will perform toilet transfers with assistance level of: Independent with assistive  device   Problem: RH Tub/Shower Transfers Goal: LTG Patient will perform tub/shower transfers w/assist (OT) Description: LTG: Patient will perform tub/shower transfers with assist, with/without cues using equipment (OT) Flowsheets (Taken 04/17/2020 1609) LTG: Pt will perform tub/shower stall transfers with assistance level of: Set up assist
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
## 2020-04-17 NOTE — Plan of Care (Signed)
  Problem: Consults Goal: RH SPINAL CORD INJURY PATIENT EDUCATION Description:  See Patient Education module for education specifics.  Outcome: Progressing Goal: Skin Care Protocol Initiated - if Braden Score 18 or less Description: If consults are not indicated, leave blank or document N/A Outcome: Progressing   Problem: SCI BOWEL ELIMINATION Goal: RH STG MANAGE BOWEL WITH ASSISTANCE Description: STG Manage Bowel with max Assistance. Outcome: Progressing Goal: RH STG SCI MANAGE BOWEL PROGRAM W/ASSIST OR AS APPROPRIATE Description: STG SCI Manage bowel program w/max assist or as appropriate. Outcome: Progressing   Problem: SCI BLADDER ELIMINATION Goal: RH STG MANAGE BLADDER WITH ASSISTANCE Description: STG Manage Bladder With max Assistance Outcome: Progressing Goal: RH STG SCI MANAGE BLADDER PROGRAM W/ASSISTANCE Description: Patient/caregiver will verbalize understanding of bladder program and demonstrate proper bladder emptying techniques. Outcome: Progressing   Problem: RH SKIN INTEGRITY Goal: RH STG SKIN FREE OF INFECTION/BREAKDOWN Description: Patients skin will remain free from further infection or breakdown with min assist. Outcome: Progressing Goal: RH STG MAINTAIN SKIN INTEGRITY WITH ASSISTANCE Description: STG Maintain Skin Integrity With min Assistance. Outcome: Progressing   Problem: RH KNOWLEDGE DEFICIT SCI Goal: RH STG INCREASE KNOWLEDGE OF SELF CARE AFTER SCI Description: Patient/caregiver will verbalize understanding of self care after SCI as outlined by MD and nursing staff including medications, bowel and bladder management, pain management, and follow up care with min assist. Outcome: Progressing

## 2020-04-17 NOTE — Progress Notes (Signed)
Patient very receptive to teaching, therefore started education today on self cathing.  Patient at first reluctant saying "I already know how to do that" but appreciative of step-by-step instructions on cathing via clean technique which he will need to perform at home.  Patient did excellent job on self cath.  Will encourage patient to perform his own caths from this point forward to increase comfort prior to discharge.    Noted that patient did not complete bowel program last night.  He had not eaten at time of scheduled suppository (had ordered food to be delivered) thus passed on to night shift.  Night shift RN reported that even after patient received his food, he did not want to eat it right away thus did not complete bowel program at beginning of night shift.  At approximately 1 am, RN approached patient about bowel program but he politely declined stating he had a bowel movement yesterday.  Night RN educated patient on importance of regular bowel emptying, which was reinforced by me today.  Patient willing to proceed with scheduled bowel program this evening after dinner.  Dani Gobble, RN

## 2020-04-17 NOTE — Progress Notes (Signed)
Patient noted to be incontinent of urine prior to self cath.  Bladder scan 290 prior to cath.  Patient performed self cath with minimal cues from RN.  Performed peri care and assisted patient to comfortable position in bed.  Will continue to reinforce teaching. Dani Gobble, RN

## 2020-04-17 NOTE — Progress Notes (Signed)
Approached patient about timing of bowel program.  Patient states he is not ready to eat right now given that he ate lunch at approximately 1430.  Discussed need to have bowel program completed by 10 pm to ensure adequate rest time.  Patient agreed.  Changed admin time for suppository to next shift for completion.  Will continue to educate need for bowel program.  Patient resting comfortably in bed.  Will continue to monitor.  Dani Gobble, RN
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
## 2020-04-18 MED ORDER — CEPHALEXIN 250 MG PO CAPS
500.0000 mg | ORAL_CAPSULE | Freq: Four times a day (QID) | ORAL | Status: AC
  Administered 2020-04-18 – 2020-04-19 (×3): 500 mg via ORAL
  Filled 2020-04-18 (×5): qty 2
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
## 2020-04-18 NOTE — Plan of Care (Signed)
  Problem: Consults Goal: RH SPINAL CORD INJURY PATIENT EDUCATION Description:  See Patient Education module for education specifics.  Outcome: Progressing Goal: Skin Care Protocol Initiated - if Braden Score 18 or less Description: If consults are not indicated, leave blank or document N/A Outcome: Progressing   Problem: SCI BOWEL ELIMINATION Goal: RH STG MANAGE BOWEL WITH ASSISTANCE Description: STG Manage Bowel with max Assistance. Outcome: Progressing Goal: RH STG SCI MANAGE BOWEL PROGRAM W/ASSIST OR AS APPROPRIATE Description: STG SCI Manage bowel program w/max assist or as appropriate. Outcome: Progressing   Problem: SCI BLADDER ELIMINATION Goal: RH STG MANAGE BLADDER WITH ASSISTANCE Description: STG Manage Bladder With min Assistance Outcome: Progressing Goal: RH STG SCI MANAGE BLADDER PROGRAM W/ASSISTANCE Description: Patient/caregiver will verbalize understanding of bladder program and demonstrate proper bladder emptying techniques. Outcome: Progressing   Problem: RH SKIN INTEGRITY Goal: RH STG SKIN FREE OF INFECTION/BREAKDOWN Description: Patients skin will remain free from further infection or breakdown with min assist. Outcome: Progressing Goal: RH STG MAINTAIN SKIN INTEGRITY WITH ASSISTANCE Description: STG Maintain Skin Integrity With min Assistance. Outcome: Progressing   Problem: RH KNOWLEDGE DEFICIT SCI Goal: RH STG INCREASE KNOWLEDGE OF SELF CARE AFTER SCI Description: Patient/caregiver will verbalize understanding of self care after SCI as outlined by MD and nursing staff including medications, bowel and bladder management, pain management, and follow up care with min assist. Outcome: Progressing   

## 2020-04-18 NOTE — Progress Notes (Signed)
At 0330, incontinent in brief, bladder scan-390, I & O cath-400cc's. Patient performed cath with minimal cues. Urine cloudy and malodorous. C/O old IV site on right arm hurting, heat pack applied.Alfredo Martinez A

## 2020-04-18 NOTE — Progress Notes (Signed)
Attempted self cath, met with resistance and staff completed. Incontinent prior to cath at 2143, bladder scan=383, I & O cath=400cc's. Urine cloudy and malodorous. Reluctant to take supp., given at 2341 per patient. At [place], dig stim for large, tyoe 3 stool. Left on left side after bowel program. PRN Oxy IR '15mg'$ 's given at 2045. Small foam dressing to right chest and right shoulder blade. Prevalon boots in place.Patrici Ranks A

## 2020-04-19 ENCOUNTER — Inpatient Hospital Stay (HOSPITAL_COMMUNITY): Payer: Self-pay | Admitting: Occupational Therapy

## 2020-04-19 ENCOUNTER — Inpatient Hospital Stay (HOSPITAL_COMMUNITY): Payer: Self-pay | Admitting: Physical Therapy

## 2020-04-19 NOTE — Progress Notes (Signed)
Inpatient Rehabilitation  Patient information reviewed and entered into eRehab system by Ananiah Maciolek M. Tarik Teixeira, M.A., CCC/SLP, PPS Coordinator.  Information including medical coding, functional ability and quality indicators will be reviewed and updated through discharge.    

## 2020-04-19 NOTE — Progress Notes (Signed)
Patient woke from nap a few minutes ago, reports "arms hurting to the bone", vision blurry and feels sweaty. Refused scheduled neurontin & ultram. "I don't want to take anymore meds." No further incontinent BM's after bowel program. [name] A
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
## 2020-04-19 NOTE — Progress Notes (Signed)
Physical Therapy Session Note  Patient Details  Name: [name] MRN: [id number] Date of Birth: 07-Mar-1991  Today's Date: 04/19/2020 PT Individual Time: 1100-1140 PT Individual Time Calculation (min): 40 min  PT Missed Time: 20 min Missed Time Reason: patient illness (nausea, dizziness)  Short Term Goals: Week 1:  PT Short Term Goal 1 (Week 1): Pt will perform bed mobility with min assist PT Short Term Goal 2 (Week 1): Pt will transf to and from [facility] with min assist consistently PT Short Term Goal 3 (Week 1): Pt will propel WC with supervision assist 275ft PT Short Term Goal 4 (Week 1): pt will maintain sitting balance EOB up to 5 mintues with CGA-supervision assist  Skilled Therapeutic Interventions/Progress Updates:    Pt received supine in bed, initially declines participation in therapy session due to ongoing nausea and dizziness with mobility. Pt reports pain in his back described as "tightness", not rated and reports being premedicated prior to start of therapy session. With encouragement pt agreeable to bed level therapy session. Attempted to have pt elevate HOB via hospital bed controls, pt able to increase incline about 20 degrees before reporting increase in nausea and dizziness. Supine BP 132/90 prior to elevating HOB with pt wearing abdominal binder and BLE thigh-high TEDs. With HOB elevated BP 126/82. Pt requests to return to supine due to symptoms worsening when seated in more upright position. Once pt returned to supine he reports improvement in symptoms but still has ongoing nausea. Reviewed BLE PROM in available planes of motion to prevent contracture. Attempted education about importance of PROM and positioning, pt falling asleep during session. Provided handout for PROM stretches, will continue with education. Pt missed 20 min of scheduled therapy session due to ongoing nausea and dizziness limiting his ability to functionally participate.  Therapy  Documentation Precautions:  Precautions Precautions: Fall Required Braces or Orthoses: Spinal Brace Spinal Brace: Applied in supine position, Thoracolumbosacral orthotic Restrictions Weight Bearing Restrictions: No    Therapy/Group: Individual Therapy   Peter Congo, PT, DPT  04/19/2020, 11:52 AM

## 2020-04-19 NOTE — Progress Notes (Signed)
Patient ID: KEYTON BHAT, male   DOB: 12-Aug-1991, 29 y.o.   MRN: [id number]  SW spoke with Imogene Burn Assist [phone]) stating pt completed Medicaid application, and application status is pending at this time.   SW spoke with [place] [phone]) to schedule new patient appointment for Friday, June 26 at 9:20am with Kathe Becton, NP. Sw called back and and spoke with [facility] requesting for financial assistance to be discussed with patient.   *SW met with pt and provided pt with appointment card for upcoming appointment.   SW left message for pt mother Otha Monical ([phone]) to introduce self, explain role, and discuss discharge plan. SW waiting on follow-up.   [name], MSW, [place] Office: [phone] Cell: [phone] Fax: [phone]
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
## 2020-04-19 NOTE — Progress Notes (Signed)
Occupational Therapy Session Note  Patient Details  Name: [name] MRN: [id number] Date of Birth: 03/19/1991  Today's Date: 04/19/2020 OT Individual Time: 1305-1405 OT Individual Time Calculation (min): 60 min    Short Term Goals: Week 1:  OT Short Term Goal 1 (Week 1): STGs=LTGs due to ELOS  Skilled Therapeutic Interventions/Progress Updates:    Pt seen for vestibular evaluation and treatment secondary to pt reporting dizziness during therapy.  Pt in bed to start session.  He declined transferring to the EOB but he did participate while in bed.  At rest at midline with HOB elevated to approximately 45 degrees his BP was 134/95 and 127/95.  He reports dizziness with HOB elevated as well as in supine, but does express that the higher he raises the [facility] the more dizzy he gets.  Noted at rest with fixed gaze at midline he exhibits slight horizontal nystagmus/ tick in bilateral eyes.  This was present with HOB up as well as when it was flat.  No movements intensified the amount of nystagmus as it remained constant at a low volume with rolling side to side as well as with roll test for horizontal canal BPPV.  Completed modified anterior/posterior canal testing with bed positioned in trendelenberg and both sides negative.  Head thrust test was completed and was normal on both sides, VOR reflex was normal as well.  He demonstrated no further nystagmus than what was at baseline with head shaking test.  Visual tracking was [facility] as well as convergence.  With [facility] elevated up to as high as it would go, pt did not demonstrate any worsening nystagmus than what was present at baseline, but he reports increased dizziness.  BP with HOB up and TEDs and binder in place was again at 135/96.  Unclear at this time what is making him dizzy and nauseas as all tests completed during session were negative.  He does exhibit the slight horizontal nystagmus at all times which could be indicative of a unilateral or bilateral  hypofunction, however he reports no ringing in his ears or change in his hearing, no new blurry vision, and head thrust test and VOR were WFLS.   Discussed with pt the need to continue trying to get up and work with therapy and see if symptoms worsen or get better.  Will continue to monitor in therapy, but no clear conclusion can be made as to what is causing his dizziness.  Pt left in bed with call button and phone in reach at end of session.  Have discussed findings with PT and they will continue to monitor for improvement or worsening with sessions.    Therapy Documentation Precautions:  Precautions Precautions: Fall Precaution Booklet Issued: No Precaution Comments: bil prevalon boots, TED hose, abdominal binder for BP support.  Required Braces or Orthoses: Spinal Brace Spinal Brace: Applied in supine position, Thoracolumbosacral orthotic Restrictions Weight Bearing Restrictions: No   Pain: Pain Assessment Pain Scale: Faces Pain Score: 3  Faces Pain Scale: Hurts little more Pain Type: Acute pain Pain Location: Back Pain Orientation: Lower Pain Descriptors / Indicators: Discomfort Pain Onset: With Activity Pain Intervention(s): Repositioned  Therapy/Group: Individual Therapy  Darlin Stenseth OTR/L 04/19/2020, 2:19 PM

## 2020-04-19 NOTE — Progress Notes (Signed)
Occupational Therapy Session Note  Patient Details  Name: [name] MRN: [id number] Date of Birth: [birth year]  Today's Date: 04/19/2020 OT Individual Time: 0907-1020 OT Individual Time Calculation (min): 73 min    Short Term Goals: Week 1:  OT Short Term Goal 1 (Week 1): STGs=LTGs due to ELOS  Skilled Therapeutic Interventions/Progress Updates:    Patient in bed, states that he would like to get bathed and dressed this session.  Reviewed role of OT and goals for therapy.  Patient able to complete bathing after set up at bed level with min A for buttocks and back, otherwise able to reach all areas.  UB dressing set up, LB dressing min A for incontinence brief otherwise set up and increased time - he is able to donn thigh high teds and abdominal binder with set up.  Grooming tasks completed bed level with set up.  He demonstrates independence with rolling in bed, scooting to [facility] and adjusting bed using controls.  Reviewed skin care/LB positioning/care of movement and importance of LB inspection due to absent sensation.  He remained in bed at close of session, call bell and tray table in reach.    Therapy Documentation Precautions:  Precautions Precautions: Fall Required Braces or Orthoses: Spinal Brace Spinal Brace: Applied in supine position, Thoracolumbosacral orthotic Restrictions Weight Bearing Restrictions: No General: General PT Missed Treatment Reason: Patient ill (Comment)(nausea, dizziness) Vital Signs:   Pain: Pain Assessment Pain Scale: 0-10 Pain Score: 3  Pain Location: Back Pain Orientation: Lower Pain Descriptors / Indicators: Discomfort Pain Intervention(s): Repositioned   Therapy/Group: Individual Therapy  Barrie Lyme 04/19/2020, 2:14 PM

## 2020-04-19 NOTE — Progress Notes (Signed)
Poor night sleep. Declined scheduled ultram. Oxy IR given at 2029 & 0207, for complaint of back pain. At 2200, bladder scan=344, I & O cath=375. At 0400, bladder scan=333, I & O cath 325cc's. Patient performed both caths with minimal instruction. Dulcolax supp. Given at 2204, incontinent of small hard stool prior to inserting supp. At 2300, performed dig. Stim, with moderate hard stools. At 0400, complained of feeling dizzy and nauseated after raising HOB to do I & O cath. PRN compazine 10mg 's given at 63. 60 A
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
## 2020-04-19 NOTE — Progress Notes (Signed)
Inpatient Rehabilitation Center Individual Statement of Services  Patient Name:  [name]  Date:  04/19/2020  Welcome to the Inpatient Rehabilitation Center.  Our goal is to provide you with an individualized program based on your diagnosis and situation, designed to meet your specific needs.  With this comprehensive rehabilitation program, you will be expected to participate in at least 3 hours of rehabilitation therapies Monday-Friday, with modified therapy programming on the weekends.  Your rehabilitation program will include the following services:  Physical Therapy (PT), Occupational Therapy (OT), Speech Therapy (ST), 24 hour per day rehabilitation nursing, Therapeutic Recreaction (TR), Psychology, Neuropsychology, Care Coordinator, Rehabilitation Medicine, Nutrition Services, Pharmacy Services and Other  Weekly team conferences will be held on Tuesdays to discuss your progress.  Your Inpatient Rehabilitation Care Coordinator will talk with you frequently to get your input and to update you on team discussions.  Team conferences with you and your family in attendance may also be held.  Expected length of stay: 10-14 days    Overall anticipated outcome: Independent with Assistive Device  Depending on your progress and recovery, your program may change. Your Inpatient Rehabilitation Care Coordinator will coordinate services and will keep you informed of any changes. Your Inpatient Rehabilitation Care Coordinator's name and contact numbers are listed  below.  The following services may also be recommended but are not provided by the Inpatient Rehabilitation Center:   Driving Evaluations  Home Health Rehabiltiation Services  Outpatient Rehabilitation Services  Vocational Rehabilitation   Arrangements will be made to provide these services after discharge if needed.  Arrangements include referral to agencies that provide these services.  Your insurance has been verified to be:   Uninsured  Your primary doctor is:  No PCP  Pertinent information will be shared with your doctor and your insurance company.  Inpatient Rehabilitation Care Coordinator:  Susie Cassette [phone] or [phone]  Information discussed with and copy given to patient by: Gretchen Short, 04/19/2020, 10:33 AM

## 2020-04-19 NOTE — Progress Notes (Signed)
Pt performed own self cath with just set up assist to obtain wash cloths and materials. Pt opened packaging, performed peri care, and inserted catheter independently.

## 2020-04-19 NOTE — Plan of Care (Signed)
  Problem: Consults Goal: RH SPINAL CORD INJURY PATIENT EDUCATION Description:  See Patient Education module for education specifics.  Outcome: Progressing Goal: Skin Care Protocol Initiated - if Braden Score 18 or less Description: If consults are not indicated, leave blank or document N/A Outcome: Progressing   Problem: SCI BOWEL ELIMINATION Goal: RH STG MANAGE BOWEL WITH ASSISTANCE Description: STG Manage Bowel with max Assistance. Outcome: Progressing Goal: RH STG SCI MANAGE BOWEL PROGRAM W/ASSIST OR AS APPROPRIATE Description: STG SCI Manage bowel program w/max assist or as appropriate. Outcome: Progressing   Problem: SCI BLADDER ELIMINATION Goal: RH STG MANAGE BLADDER WITH ASSISTANCE Description: STG Manage Bladder With min Assistance Outcome: Progressing Goal: RH STG SCI MANAGE BLADDER PROGRAM W/ASSISTANCE Description: Patient/caregiver will verbalize understanding of bladder program and demonstrate proper bladder emptying techniques. Outcome: Progressing   Problem: RH SKIN INTEGRITY Goal: RH STG SKIN FREE OF INFECTION/BREAKDOWN Description: Patients skin will remain free from further infection or breakdown with min assist. Outcome: Progressing Goal: RH STG MAINTAIN SKIN INTEGRITY WITH ASSISTANCE Description: STG Maintain Skin Integrity With min Assistance. Outcome: Progressing   Problem: RH KNOWLEDGE DEFICIT SCI Goal: RH STG INCREASE KNOWLEDGE OF SELF CARE AFTER SCI Description: Patient/caregiver will verbalize understanding of self care after SCI as outlined by MD and nursing staff including medications, bowel and bladder management, pain management, and follow up care with min assist. Outcome: Progressing   

## 2020-04-19 NOTE — Progress Notes (Signed)
Patient concerned about worsening symptoms of dizziness with position change, nausea, and impaired vision.  He thinks it may be due to medications he is taking.  He days he doesn't want to take a lot of meds and is refusing to take any medications unless his pain gets "out of control."  Encouraged patient not to wait until pain levels are unbearable as it is harder to regain adequate pain control.  Encouraged patient to discuss symptoms with MD during rounds.  Will continue to monitor.  Dani Gobble, RN

## 2020-04-19 NOTE — Progress Notes (Signed)
RN notified that patient refused labs this am. Pt did refuse most of his medications with nurse earlier today as well. Pt was agreeable to taking stool softener and did perform own lovenox injection per [facility]. PA aware.

## 2020-04-20 ENCOUNTER — Inpatient Hospital Stay (HOSPITAL_COMMUNITY): Payer: Self-pay | Admitting: Occupational Therapy

## 2020-04-20 ENCOUNTER — Inpatient Hospital Stay (HOSPITAL_COMMUNITY): Payer: Self-pay | Admitting: Physical Therapy

## 2020-04-20 MED ORDER — GABAPENTIN 300 MG PO CAPS: 300.0000 mg | ORAL_CAPSULE | Freq: Every day | ORAL | Status: DC
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
## 2020-04-20 NOTE — Progress Notes (Signed)
Pt was agreeable to taking spasm medication and pain medication. He allowed RN to put lidocaine patches on back and he performed his lovenox injection. Pt reports that he is on too many medications and that they make him feel too bad to do therapy. Pt was able to self cath with set up assist from staff. RN asked patient who was going to perform his bowel program at home and he states that he will do it. I will report off to the RN on pm shift that patient needs to assist with that this afternoon to begin education for discharge.

## 2020-04-20 NOTE — Plan of Care (Signed)
  Problem: SCI BOWEL ELIMINATION Goal: RH STG MANAGE BOWEL WITH ASSISTANCE Description: STG Manage Bowel with max Assistance. Outcome: Not Progressing Note: Total assist- pt agreed to perform Bowel program himself this afternoon Goal: RH STG SCI MANAGE BOWEL PROGRAM W/ASSIST OR AS APPROPRIATE Description: STG SCI Manage bowel program w/max assist or as appropriate. Outcome: Not Progressing

## 2020-04-20 NOTE — Progress Notes (Signed)
Occupational Therapy Session Note  Patient Details  Name: LUCIUS WISE MRN: [id number] Date of Birth: 1991/10/30  Today's Date: 04/20/2020 OT Individual Time: 0757-0900 OT Individual Time Calculation (min): 63 min    Short Term Goals: Week 1:  OT Short Term Goal 1 (Week 1): STGs=LTGs due to ELOS  Skilled Therapeutic Interventions/Progress Updates:    Patient in bed.   He states that pain is currently under control.  He reports dizziness but tolerates HOB elevated - BP in long sit position 138/110 and HR 123 after donning TEDs independently.  He is able to donn TLSO in supine position with set up.  Rolling in bed mod I with rails.  He states that he is leaving tomorrow and does not want to take any medications.  Dr Berline Chough present during session to review medications and adjustments.  Reviewed goals for therapy and DME options for home environment - he states that he would prefer to sleep in standard bed but understands benefits of hospital bed for repositioning and support in long sit position.  Reviewed use of drop arm commode and set up / timing for bowel program and process to allow for independence, reach for digital stimulation in seated position.  Supine to sitting edge of bed with CGA and close guard due to poor trunk control - he uses rails and UB positioning to adjust for balance deficits - cues for safety.  Sit pivot transfer to/from drop arm commode from bed surface with CGA - high fall risk but he would not allow me to touch/help him during transfer.  Able to reach buttocks when seated on [facility] for bowel program.  Sitting edge of bed to supine position with CGA.  He remained in bed at close of session, bed alarm set and call bell in reach.     Therapy Documentation Precautions:  Precautions Precautions: Fall Precaution Booklet Issued: No Precaution Comments: bil prevalon boots, TED hose, abdominal binder for BP support.  Required Braces or Orthoses: Spinal Brace Spinal Brace:  Applied in supine position, Thoracolumbosacral orthotic Restrictions Weight Bearing Restrictions: No   Therapy/Group: Individual Therapy  Barrie Lyme 04/20/2020, 7:35 AM
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
## 2020-04-20 NOTE — Progress Notes (Addendum)
Patient ID: [name], male   DOB: 08-Mar-1991, 29 y.o.   MRN: [id number]  SW left message for pt mother [name] ([phone]) requesting return phone call to provide updates on pt discharge. SW waiting on follow-up.   *SW met with pt in room to provide updates from team conference, and d/c date 04/28/2020. Pt states he will be leaving on Wednesday. SW informed will need to know his address to help with coordinating his care needs. Pt states his mother is working on getting him a place. While in room, pt called his mother. Pt mother states she has is still working on trying to find him a place. Pt was adamant about leaving tomorrow, and states he will keep working on this. SW informed not likely to have all his needs in place by tomorrow.Pt mother is allowing pt to make his own decisions. SW discussed with pt family education and having supports come in to the hospital. Pt states he is able to handle it. Pt states he is able to handle I/o cath and bowel program. Pt states he has a tempur pedic mattress with adjustable base top/bottom and will not need hospital bed. SW informed pt he will need to purchase DABSC. SW informed pt will follow-up tomorrow.  [name], MSW, [place] Office: [phone] Cell: [phone] Fax: [phone]

## 2020-04-20 NOTE — Progress Notes (Signed)
Pt became paranoid and agitated with Clinical research associate, pt wanted his oxycodone and was told he could not have it until 0030 as it was scheduled for every four hours as needed, pt stated everyone else gave it to him whenever he wanted it and that I was lying to him. Pt stated he did not want his tylenol or tramadol he wanted his oxycodone.  Stated his pain was extreme. Pt wanted proof of statement regarding medication. Medication (MAR) printed out and reviewed with patient, patient still insisting that writer is lying to him asked for another nurse to come in and for writer to remain in the room and to not say anything to the nurse. Writer told NT to have Thurston Hole come into room patient immediately said No not Thurston Hole NT asked to send in any RN at the desk except Thurston Hole. Baxter Hire arrived into room patient said can you tell me how this medication is given Belenda Cruise read the order to him and he said " how did you know which medication?" Belenda Cruise said that he had pointed to it and pt became agitated and stated " No I did not, you lying to me. How did you know which medication? I don't believe you " patient told writer he did not want me in his room. Patient then called and stated he wanted to speak to a supervisor. John from [facility] called and he spoke to the patient with Dionne RN present for buddy system. Jacalyn Lefevre called and order for neuro-psych obtained. Decision made for Dionne to assume care for patient.

## 2020-04-20 NOTE — Progress Notes (Signed)
Physical Therapy Session Note  Patient Details  Name: [name] MRN: [id number] Date of Birth: November 12, 1991  Today's Date: 04/20/2020 PT Individual Time: 1000-1030 PT Individual Time Calculation (min): 30 min   Short Term Goals: Week 1:  PT Short Term Goal 1 (Week 1): Pt will perform bed mobility with min assist PT Short Term Goal 2 (Week 1): Pt will transf to and from [facility] with min assist consistently PT Short Term Goal 3 (Week 1): Pt will propel WC with supervision assist 264ft PT Short Term Goal 4 (Week 1): pt will maintain sitting balance EOB up to 5 mintues with CGA-supervision assist  Skilled Therapeutic Interventions/Progress Updates:    Pt received supine in bed, immediately refuses participation in therapy session upon therapist arrival. Pt reports ongoing nausea and dizziness with upright mobility and frustrated by ongoing symptoms, believes them to be medication-related. Pt also stating he plans to d/c home tomorrow. Pt is adamant that he knows everything that he needs to know and can take care of himself at his current level. Extensive education with patient with regards to current mobility level, assist needed for safety with transfers, setting up a seating evaluation for a custom w/c vs a rental w/c, and that family education with his wife and his mom need to take place prior to d/c home with regards to transfers but also bowel and bladder program. Unsure if pt receptive to education. Also encouraged pt to perform transfers and OOB mobility this session to prevent functional decline from being bed-bound and to continue to progress with mobility. Pt refuses any OOB mobility this session due to reports of ongoing nausea and dizziness with mobility and reports he performed transfers during earlier OT session so he is done for the day. Pt missed 30 min of scheduled therapy session due to refusal.  Therapy Documentation Precautions:  Precautions Precautions: Fall Precaution Booklet  Issued: No Precaution Comments: bil prevalon boots, TED hose, abdominal binder for BP support.  Required Braces or Orthoses: Spinal Brace Spinal Brace: Applied in supine position, Thoracolumbosacral orthotic Restrictions Weight Bearing Restrictions: No General: PT Amount of Missed Time (min): 30 Minutes PT Missed Treatment Reason: Patient unwilling to participate    Therapy/Group: Individual Therapy   Peter Congo, PT, DPT  04/20/2020, 10:38 AM

## 2020-04-20 NOTE — Progress Notes (Signed)
Occupational Therapy Session Note  Patient Details  Name: [name] MRN: [id number] Date of Birth: 01-02-1991  Today's Date: 04/20/2020 OT Individual Time: 1413-1446 OT Individual Time Calculation (min): 33 min  30 minute missed time - d/t refusal     Short Term Goals: Week 1:  OT Short Term Goal 1 (Week 1): STGs=LTGs due to ELOS  Skilled Therapeutic Interventions/Progress Updates:    Pt reclined in bed at time of session in supine stating "I am not doing any of that therapy today." When discussing with the pt the purpose and plan for OT to get the pt home, pt stated he needed a wheelchair to go home and agreeable to transfer to get OOB. LB dressing at bed level with set up using figure four position while supine with cues from therapist for pacing to not injure his BLEs d/t impulsively handling his legs.Educated pt on sensation loss in B LEs and importance of safe handling and checking skin integrity.Pt donning TLSO brace with set up A from bed.  Pt able to roll L and R with use of bed rails with Mod I to don pants over hips. Supine to sitting up at EOB with supervision, pt impulsive and not steady when coming up to sitting. Pt very unstable but does not allow therapist to assist and reports, " I'm not a cripple. I can do this myself". Attempted to use slide board, pt declined. Pt handled his own wheelchair, bringing up to side of bed and declining any touching/steadying assist from therapist, pushed up from bed surface and used wheelchair arm to lift buttocks and get into chair with removed armrest. Pt very impulsive and unsafe during transfer. Pt donning pull over shirt with set up once in wheelchair. Wheelchair mobility throughout facility to maneuver obstacles and take turns with supervision  with pt able to use BUEs to self propel. Pt transferred back to bed with supervision with same technique, very unsafe and impulsive. Pt ed also provided for putting on and removing his own leg rests  for home use. Pt also doffing all clothing and brace at bed level without assistance. Pt declined further OT session.   Therapy Documentation Precautions:  Precautions Precautions: Fall Precaution Booklet Issued: No Precaution Comments: bil prevalon boots, TED hose, abdominal binder for BP support.  Required Braces or Orthoses: Spinal Brace Spinal Brace: Applied in supine position, Thoracolumbosacral orthotic Restrictions Weight Bearing Restrictions: No   Therapy/Group: Individual Therapy  Erasmo Score 04/20/2020, 2:56 PM

## 2020-04-21 ENCOUNTER — Inpatient Hospital Stay (HOSPITAL_COMMUNITY): Payer: Self-pay | Admitting: Occupational Therapy

## 2020-04-21 ENCOUNTER — Inpatient Hospital Stay (HOSPITAL_COMMUNITY): Payer: Self-pay | Admitting: Physical Therapy

## 2020-04-21 ENCOUNTER — Encounter (HOSPITAL_COMMUNITY): Payer: Self-pay | Admitting: Psychology

## 2020-04-21 DIAGNOSIS — S24109D Unspecified injury at unspecified level of thoracic spinal cord, subsequent encounter: Secondary | ICD-10-CM

## 2020-04-21 NOTE — Progress Notes (Signed)
Called to room due to patient asking for supervisor. Pt in bed resting with eyes closed when I entered room. I and Baird Cancer, NT woke up patient. Explained to patient that I was in to listen to some concerns that he had. Pt explained situation last night, stating that nurse went into room and would not give his pain medication and then left his patches on too long and has caused his back to have burning. Pt stated that there must be blisters on his back because of the pain. Asked if we could roll him and look at his back. Rolled to left and took off abdominal binder. Noted no redness or irritation on lower back where patient was complaining of pain. Asked pt if he wanted [place] to take picture of his back on his phone to show him. Pt agreed to take picture with his phone. We took picture of his back with his phone to show no redness, no blisters, no irritation. Pt stated that there was redness and that he was having burning pain from the patches. Pt got mad at me and asked me to leave and asked who my supervisor was.
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
## 2020-04-21 NOTE — Progress Notes (Signed)
Physical Therapy Session Note  Patient Details  Name: [name] MRN: [id number] Date of Birth: 10-Oct-1991  Today's Date: 04/21/2020     Short Term Goals: Week 1:  PT Short Term Goal 1 (Week 1): Pt will perform bed mobility with min assist PT Short Term Goal 2 (Week 1): Pt will transf to and from [facility] with min assist consistently PT Short Term Goal 3 (Week 1): Pt will propel WC with supervision assist 271ft PT Short Term Goal 4 (Week 1): pt will maintain sitting balance EOB up to 5 mintues with CGA-supervision assist  Skilled Therapeutic Interventions/Progress Updates:   Pt received sidelying in bed following RN performing in/out cath. Pt declines any therapy at this time due to high levels of back pain. Pt reports that he will participate tomorrow, but needs to rest for the remainder of the day.  Left in bed with NT present        Therapy Documentation Precautions:  Precautions Precautions: Fall Precaution Booklet Issued: No Precaution Comments: bil prevalon boots, TED hose, abdominal binder for BP support.  Required Braces or Orthoses: Spinal Brace Spinal Brace: Applied in supine position, Thoracolumbosacral orthotic Restrictions Weight Bearing Restrictions: No    Therapy/Group: Individual Therapy  [name] 04/21/2020, 8:01 AM

## 2020-04-21 NOTE — Progress Notes (Signed)
Pt continues to be calm with RN and NT today. Pt did not perform injections today and did not perform catheterizations today on his own today but reports that he plans to do them himself tomorrow. Pt is resting in bed at this time. Pt has both side rails down at the moment and the risks were explained to him of falling but patient wants them down so he can have access to the bedside table.

## 2020-04-21 NOTE — Progress Notes (Signed)
Patient ID: [name], male   DOB: 10/13/1991, 29 y.o.   MRN: [id number]    SW placed DME order: w/c and slide board order to [employer] [facility] via parachute.   SW provided pt with sample catheters.   *Updates from [employer] [facility] indicate pt is not willing to put card on file for DME. SW did explain to patient on yesterday this would be required. SW to discuss with patient as this will be a barrier to discharge to home.   Cecile Sheerer, MSW, LCSWA Office: [phone] Cell: [phone] Fax: [phone]

## 2020-04-21 NOTE — Progress Notes (Signed)
Author accepted primary nursing obligation of patient following request for reassignment.  Agreement to administer medications and assist with provision of care for the remainder of shift.  Patient cooperative with bowel program and scheduled Bisacodyl suppository administered.  Resultant large bowel movement via manual disimpaction.  Lidocaine patches removed from back.  Bladder scan performed with result of 129 mL.  Patient opted against self-catherization at this time.  Scheduled Tramadol 100 mg oral tolerated without difficulty.  Patient refused Zolpidem and other sleep aids offered.  Teaching reinforced related to scheduled Cephalexin 500 mg capsule for IV infection/cellulitis.  Patient verbalized understanding, however refused medication.  No further needs or concerns apparent at the time of this writing.

## 2020-04-21 NOTE — Progress Notes (Signed)
Occupational Therapy Note  Patient Details  Name: [name] MRN: [id number] Date of Birth: 1990/11/28  Today's Date: 04/21/2020 Pt declined participation in therapy this am secondary to increased back pain.  He was laying on his right side and refused to move because of the pain stating "I'm not doing anything today my pain is too much, but I plan on getting back at it tomorrow."  Pt left with call button and phone in reach.  He missed 60 mins of OT this session  [name] OTR/L 04/21/2020, 11:12 AM

## 2020-04-21 NOTE — Progress Notes (Signed)
Physical Therapy Session Note  Patient Details  Name: [name] MRN: [id number] Date of Birth: [birth year]  Today's Date: 04/21/2020 PT Missed Time: 60 min Missed Time Reason: patient unwilling to participate     Short Term Goals: Week 1:  PT Short Term Goal 1 (Week 1): Pt will perform bed mobility with min assist PT Short Term Goal 2 (Week 1): Pt will transf to and from [facility] with min assist consistently PT Short Term Goal 3 (Week 1): Pt will propel WC with supervision assist 289ft PT Short Term Goal 4 (Week 1): pt will maintain sitting balance EOB up to 5 mintues with CGA-supervision assist  Skilled Therapeutic Interventions/Progress Updates:    Attempted to see patient for scheduled therapy session. Pt unwilling to participate this date due to ongoing pain but agreeable to perform OOB mobility tomorrow. Provided pt with 16x18 w/c for improved fit to attempt to use next session. Pt left sidelying in bed with all needs in reach. Pt missed 60 min of scheduled therapy session due to refusal to participate.  Therapy Documentation Precautions:  Precautions Precautions: Fall Precaution Booklet Issued: No Precaution Comments: bil prevalon boots, TED hose, abdominal binder for BP support.  Required Braces or Orthoses: Spinal Brace Spinal Brace: Applied in supine position, Thoracolumbosacral orthotic Restrictions Weight Bearing Restrictions: No    Therapy/Group: Individual Therapy   Peter Congo, PT, DPT  04/21/2020, 7:47 AM

## 2020-04-22 ENCOUNTER — Inpatient Hospital Stay (HOSPITAL_COMMUNITY): Payer: Self-pay | Admitting: Occupational Therapy

## 2020-04-22 ENCOUNTER — Inpatient Hospital Stay (HOSPITAL_COMMUNITY): Payer: Self-pay | Admitting: Physical Therapy

## 2020-04-22 ENCOUNTER — Inpatient Hospital Stay (HOSPITAL_COMMUNITY): Payer: Self-pay

## 2020-04-22 NOTE — Progress Notes (Signed)
Patient ID: [name], male   DOB: 1991-04-14, 29 y.o.   MRN: [id number]   SW received updates from [employer] indicating pt declined order. Pt did not want to place a card on file. SW met with pt in rehab gym to discuss further. Pt will look for community resources to assist.  SW called pt mother to discuss above. She indicated she will continue to assist pt as best as possible but is still looking for a location for him to reside. SW will continue to follow-up and provide updates from team conference.   [name], MSW, [place] Office: [phone] Cell: [phone] Fax: [phone]

## 2020-04-22 NOTE — Progress Notes (Signed)
Occupational Therapy Session Note  Patient Details  Name: [name] MRN: [id number] Date of Birth: [birth year]  Today's Date: 04/22/2020 OT Individual Time: 1005-1100 OT Individual Time Calculation (min): 55 min    Short Term Goals: Week 1:  OT Short Term Goal 1 (Week 1): STGs=LTGs due to ELOS  Skilled Therapeutic Interventions/Progress Updates:    Upon entering the room, pt supine in bed with no c/o pain and agreeable to OT intervention. Pt declined OOB activities but agreeable to bathing tasks from bed level. Set up A for bathing and dressing tasks. OT did wash pt's back with him reporting sensitivity but no pain. Pt washing face and brushing teeth as well with set up A. Pt remained in bed with call bell and all needed items within reach upon exiting the room.    Therapy Documentation Precautions:  Precautions Precautions: Fall Precaution Booklet Issued: No Precaution Comments: bil prevalon boots, TED hose, abdominal binder for BP support.  Required Braces or Orthoses: Spinal Brace Spinal Brace: Applied in supine position, Thoracolumbosacral orthotic Restrictions Weight Bearing Restrictions: No General:   Vital Signs: Therapy Vitals Temp: 98.6 F (37 C) Pulse Rate: 96 BP: 129/87 Patient Position (if appropriate): Lying Oxygen Therapy SpO2: 98 % O2 Device: Room Air Pain:   ADL: ADL Grooming: Setup Where Assessed-Grooming: Bed level Upper Body Bathing: Setup Where Assessed-Upper Body Bathing: Bed level Lower Body Bathing: Supervision/safety Where Assessed-Lower Body Bathing: Bed level Upper Body Dressing: Setup Where Assessed-Upper Body Dressing: Bed level Lower Body Dressing: Moderate assistance Where Assessed-Lower Body Dressing: Bed level Toileting: Not assessed Toilet Transfer: Not assessed Tub/Shower Transfer: Not assessed Vision   Perception    Praxis   Exercises:   Other Treatments:     Therapy/Group: Individual Therapy  Alen Bleacher 04/22/2020, 4:00 PM

## 2020-04-22 NOTE — Progress Notes (Signed)
Around 4:30 Nt assigned to this patient informed this writer she was going to check vitals because patient thought he had a fever. Vitals taken and WNL. Patient continued to talk on the phone with his girlfriend at that time. Around 5:30  this patient rung asking for his nurse, this nurse was in another room assisting another patient. After finishing the nurse was again informed the patient needed to speak with her because he didn't feel well. The charge nurse answered the phone at that time and attempted to ask the patient what his nurse could do for him, patient was unable to explain what was going on. This writer went into the room and upon entry this nurse could hear the patient yelling "they got me fucked up in here."  This writer asked what was wrong and what I could do for him, patient started yelling saying he shouldn't have to wait for help for 2 hours that he could be dead at by now. This Clinical research associate asked the patient again what was wrong and he stated "he didn't feel right", I informed him that I understand that but he has to tell me what is feeling abnormal so I can call the provider and inform them. At that time the patient started yelling asking if this nurse gave him Tylenol earlier and this nurse explained he was wanted oxy instead. At that time the patient yelling at the nurse saying " Francesca Oman are some incompetent fucking bitches" and asked for my supervisor. At that time I exited the room Charge was informed and took over the situation.    All encounters were witnessed by Adam Phenix, lpn and Viri Nt.

## 2020-04-22 NOTE — Progress Notes (Signed)
Pt finished dinner @0000  and requested suppository.  @0005 , dig stim performed prior to suppository administered. Pt had small type 2 stool in rectum. Stool was evacuated, and then suppository was placed.  @0045 , brief was dry. Dig stim performed and pt had type 2 stool that was felt in rectum. The stool was mannually evacuated. Second dig stim performed after BM and rectal vault appeared to be empty.
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
## 2020-04-22 NOTE — Progress Notes (Signed)
Physical Therapy Session Note  Patient Details  Name: [name] MRN: [id number] Date of Birth: 11-27-1991  Today's Date: 04/22/2020 PT Individual Time: 0800-0900 PT Individual Time Calculation (min): 60 min   Short Term Goals: Week 1:  PT Short Term Goal 1 (Week 1): Pt will perform bed mobility with min assist PT Short Term Goal 2 (Week 1): Pt will transf to and from [facility] with min assist consistently PT Short Term Goal 3 (Week 1): Pt will propel WC with supervision assist 230ft PT Short Term Goal 4 (Week 1): pt will maintain sitting balance EOB up to 5 mintues with CGA-supervision assist Week 2:    Week 3:     Skilled Therapeutic Interventions/Progress Updates:    PAIN pt refused to quantify.  No c/o distress due to pain during session.  Pt initially supine and somewhat agreeable to treatment, somewhat difficult in terms of cooperation w/therapist. Pt donns ted hose , abdominal binder, TLSO, pants, shirt independently, slowly, interrupts himself to text on phone, warns therapist not to assist him in stern manner.  Supine to sit w/supervision, argues w/therapist over guarding.  Argues w/therapist over set up assist.  Performs set up w/supervison, decreased safety due to decreased sitting balance while attempting to maneuver wc but again refuses assist from therapist.   Bed to wc squat pvt transfer w/supervision, decreased safety, therapist guarding closely but not touching patient due to pt agitation w/attempts despite education re falls risk.  Therapist insisted on locking brakes of wc and adjusting position for safety and explains falls risk and therapist liablity for safety.  Pt reluctantly allows.  wc propulsion x 132ft to gym mod I but pt complaining that therapist was "leaving him" behind despite aggressively declining assist.   Good fit noted w/current seating/wc.    wc to mat w/supervision, cues for set up, decreased safety, resistant to education/guarding.  Static sit w/UE  propping Seated push up blocks 4/5 w/cues for pacing/control, cga for balance/min assist to stabilize blocks.    SW in to discuss wc needs/process.  Pt clearly not receptive to information stating "what kind of place is this it wont give me a wheelchair to take home?  Any decent human being would give a handicapped person a wc!" attempted to educate pt on process/therpist role/skills in ordering wc/pt shuts down discussion w/therapist and SW.  Pt encouraged to have family/friends look into alternative sources of equipment such as [employer].  Pt agrees to do this but will not discuss further.  Pt sit to recline w/supervision.  Pt rests several min and returns to texting on phone.  Pt adamantly declines further therex despite encouragement, becomes agitated.  Recline to sit w/supervision.  Mat to wc w/set up assist and supervision, refuses assist. wc propulsion mod I to room. wc to bed w/cues and close supervision but no hands on assist due to pt agitation/refusal.   Sit to supine w/supervision including LE management.   Bed inflated and rails up x 3, pt refuses to allow therapist to raise fourth rail, bed alarm set and tray table at bedside.  Pt educated re Pressure relief, risk of pressure sores including descriiption of sores/complications/increased risk of further sores after sore development, pressure relief schedule And pressure relief techniques.  Pt then asked to repeat information to therapist which he was able to to.  Declines actually demonstrating techniques.   Pt left supine w/rails up x 3, provided w/comfort items, alarm set, bed in lowest position, and needs in reach.  Pt very difficulty to work with and challenging personality.  High risk of falls due to aggressively overrides therapists interventions, unreceptiveness to education, refusal to listen to most attempts to educate.  Not limited by dizzyness or nausea today.  Therapy Documentation Precautions:   Precautions Precautions: Fall Precaution Booklet Issued: No Precaution Comments: bil prevalon boots, TED hose, abdominal binder for BP support.  Required Braces or Orthoses: Spinal Brace Spinal Brace: Applied in supine position, Thoracolumbosacral orthotic Restrictions Weight Bearing Restrictions: No    Therapy/Group: Individual Therapy  Rada Hay, PT   Shearon Balo 04/22/2020, 4:14 PM

## 2020-04-22 NOTE — Progress Notes (Signed)
Per patient, he is on 'vacation' and will not self administer I/O caths, BP, or Lovenox shots.  @2140 , pt refused scheduled ambien and eye drops. Pt was educated on medication compliance.  Per order pt is an I/O cath q6 if no void or >386ml. Last prior cath was @1751 . Pt has been having incontinent bladder episodes, PVR scan was 40ml @2150 . Pt questioning the need for PVR scans if he normally scans for under . Pt educated on importance of scans and emptying his bladder, but not always compliant.   Pt finished his dinner @0000 , and wanted to his suppository administered, results documented in separate note.

## 2020-04-22 NOTE — Progress Notes (Signed)
Charge nurse notified by [place], LPN that pt was showing out and acting rude to staff about pain medication. Pt stated to staff that he had been calling for over an hour and "didn't feel good but couldn't explain what was wrong". Charge RN Dorathy Daft and [place], LPN) went into room to give tramadol and tylenol per pt request and pt asked who charge nurse was, this nurse explained that I was charge and that I had been sitting at desk assisting with supervisions of other pts and answering call bells. Pt stated "ive been calling for over an hour because I dont feel good and I could have been dying cause no one will come in here" LPN and NT had been in room previous times as well. This nurse explained that the time period had been an hour wait but nurse had other pts as well and got in room when she could. Pt wanted to continue to argue with RN. This RN asked if pt wanted his pain medication or not and pt stated yes but he wanted a supervisor as well. Pain medication was given and explained that supervision was not present at this time and would notify supervisor for his issues. Dorathy Daft, RN and Morrie Sheldon, LPN were present during this interaction.   Ross Ludwig, RN

## 2020-04-22 NOTE — Progress Notes (Signed)
Pt has order for HS bowel program. Pt refused and states " I will let you know when I am ready for all of that".

## 2020-04-22 NOTE — Progress Notes (Signed)
This nurse encouraged the pt to participate in the HS Bowel program. PT states he is still not ready for it to be done right now due to his the amount of pain he is in. Pt educated on the importance of continuing to the bowel program. Pt states he will notify staff when he is ready.

## 2020-04-22 NOTE — Progress Notes (Signed)
This nurse and Kourtny, NT changed the pts soiled brief. Pt requesting to have condom cath while sleeping to prevent from wetting the bed. Pt made aware the Bladder program will still be enforced as ordered through out the shift. Pt agreeable. Verbal order from Jacalyn Lefevre to have the external male cath while sleeping.

## 2020-04-22 NOTE — Progress Notes (Signed)
Physical Therapy Session Note  Patient Details  Name: [name] MRN: [id number] Date of Birth: 11/20/1991  Today's Date: 04/22/2020 PT Missed Time: 30 min Missed Time Reason: patient refusal     Short Term Goals: Week 1:  PT Short Term Goal 1 (Week 1): Pt will perform bed mobility with min assist PT Short Term Goal 2 (Week 1): Pt will transf to and from [facility] with min assist consistently PT Short Term Goal 3 (Week 1): Pt will propel WC with supervision assist 263ft PT Short Term Goal 4 (Week 1): pt will maintain sitting balance EOB up to 5 mintues with CGA-supervision assist  Skilled Therapeutic Interventions/Progress Updates:    Attempted to see patient for scheduled therapy session. Pt in sidelying, reports having a headache and declines participation in therapy at this time. Max encouragement for participation in therapy session. Pt declines and states he participated with therapy earlier this date. Education with patient about rehab schedule and purpose of multiple therapy sessions throughout the day. Pt not receptive to education, reports he will attempt to get up during next session later this PM. Pt left in sidelying in bed with needs in reach. Pt missed 30 min of scheduled therapy session due to refusal.   Therapy Documentation Precautions:  Precautions Precautions: Fall Precaution Booklet Issued: No Precaution Comments: bil prevalon boots, TED hose, abdominal binder for BP support.  Required Braces or Orthoses: Spinal Brace Spinal Brace: Applied in supine position, Thoracolumbosacral orthotic Restrictions Weight Bearing Restrictions: No    Therapy/Group: Individual Therapy   Peter Congo, PT, DPT  04/22/2020, 7:47 AM

## 2020-04-22 NOTE — Progress Notes (Signed)
Physical Therapy Session Note  Patient Details  Name: [name] MRN: [id number] Date of Birth: 07-24-1991  Today's Date: 04/22/2020 PT Individual Time: 1530-1600 PT Individual Time Calculation (min): 30 min   Short Term Goals: Week 1:  PT Short Term Goal 1 (Week 1): Pt will perform bed mobility with min assist PT Short Term Goal 2 (Week 1): Pt will transf to and from [facility] with min assist consistently PT Short Term Goal 3 (Week 1): Pt will propel WC with supervision assist 227ft PT Short Term Goal 4 (Week 1): pt will maintain sitting balance EOB up to 5 mintues with CGA-supervision assist  Skilled Therapeutic Interventions/Progress Updates:   Pt received supine in bed and agreeable to PT. PT applied ted hose. Pt applied abdominal binder. Assist from PT to don tLSO in supine. Supine>sit transfer with supervision assist and cues for LE management. Pt noted to utilized modified long sitting strategy to come to EOB. Sitting balance EOB to perform lateral and cross body reaches x 5 each BUE. Near LOB x 3 but able to correct with supervision assist. Lateral scoot at EOB x 4 R and X4 L with supervision assist for safety and cues for head/hips relationship. Improved pelvic control and head hip relationship noted in this session compared to previous treatments. Pt requested to return to supine . Sit>supine with supervision assist for safety. Pt doffed TLSO in supine with supervision assist. And left supine in bed with call bell in reach.       Therapy Documentation Precautions:  Precautions Precautions: Fall Precaution Booklet Issued: No Precaution Comments: bil prevalon boots, TED hose, abdominal binder for BP support.  Required Braces or Orthoses: Spinal Brace Spinal Brace: Applied in supine position, Thoracolumbosacral orthotic Restrictions Weight Bearing Restrictions: No General: PT Amount of Missed Time (min): 30 Minutes PT Missed Treatment Reason: Patient unwilling to  participate Vital Signs: Therapy Vitals Temp: 98.5 F (36.9 C) Pulse Rate: 96 Resp: 16 BP: 131/84 Patient Position (if appropriate): Lying Oxygen Therapy SpO2: 96 % O2 Device: Room Air Pain: Pain Assessment Pain Scale: 0-10 Pain Score: 10-Worst pain ever Pain Type: Acute pain Pain Location: Back Pain Descriptors / Indicators: Aching;Discomfort Pain Frequency: Constant Pain Onset: On-going Pain Intervention(s): Medication (See eMAR)   Therapy/Group: Individual Therapy  [name] 04/22/2020, 6:18 PM

## 2020-04-23 ENCOUNTER — Encounter (HOSPITAL_COMMUNITY): Payer: Self-pay | Admitting: Psychology

## 2020-04-23 ENCOUNTER — Inpatient Hospital Stay (HOSPITAL_COMMUNITY): Payer: Self-pay

## 2020-04-23 ENCOUNTER — Inpatient Hospital Stay (HOSPITAL_COMMUNITY): Payer: Self-pay | Admitting: Physical Therapy

## 2020-04-23 LAB — URINALYSIS, ROUTINE W REFLEX MICROSCOPIC
Bilirubin Urine: NEGATIVE
Glucose, UA: NEGATIVE mg/dL
Ketones, ur: 5 mg/dL — AB
Nitrite: POSITIVE — AB
Protein, ur: 100 mg/dL — AB
Specific Gravity, Urine: 1.028 (ref 1.005–1.030)
WBC, UA: >50 WBC/hpf — ABNORMAL HIGH (ref 0–5)
pH: 6 (ref 5.0–8.0)
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
## 2020-04-23 NOTE — Progress Notes (Signed)
Pt called staff to room to be cleaned up. This nurse and Thu, NT changed the pts brief, pt had medium size BM. Pt continues to c/o "feeling like he is dying". No signs of distress noted at this time.
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
## 2020-04-23 NOTE — Progress Notes (Signed)
Physical Therapy Note Missed visit note x 60 min  Patient Details  Name: [name] MRN: [id number] Date of Birth: [birth year] Today's Date: 04/23/2020    Pt initially refused session, quickly became agitated and aggressive w/therapist yelling.  Unable to treat due to agitation of patient. Rada Hay, PT   Shearon Balo 04/23/2020, 3:54 PM

## 2020-04-23 NOTE — Progress Notes (Signed)
Physical Therapy Session Note  Patient Details  Name: [name] MRN: [id number] Date of Birth: 1991/06/06  Today's Date: 04/23/2020 PT Individual Time: 1415-1500 PT Individual Time Calculation (min): 45 min   Short Term Goals: Week 1:  PT Short Term Goal 1 (Week 1): Pt will perform bed mobility with min assist PT Short Term Goal 2 (Week 1): Pt will transf to and from [facility] with min assist consistently PT Short Term Goal 3 (Week 1): Pt will propel WC with supervision assist 224ft PT Short Term Goal 4 (Week 1): pt will maintain sitting balance EOB up to 5 mintues with CGA-supervision assist  Skilled Therapeutic Interventions/Progress Updates:    Pt received supine in bed, agreeable to PT session. Pt appears eager to participate in therapy session this date and has called nurse prior to therapist arrival for catheter disconnection. Pt wearing TEDs and abdominal binder, requesting hospital scrubs to don for clothing. Assisted pt with donning hospital pants at bed level for time conservation as well as TLSO. Supine to sit with mod A for BLE management. Sitting balance EOB with close CGA with pt exhibiting "tripod" positioning with BUE placed on bed behind him. Slide board transfer bed to w/c with very close SBA to CGA, pt declining assist from therapist. Once seated in w/c pt able to don hospital scrub top with setup A. Pt then begins to complain of pain and discomfort in his back, reports he feels that his spine is "protruding". Placed pillow behind pt's back for improved comfort and support while seated in w/c Pt reports no change in pain with placement of pillow. Pt then reports he feels "weird" but unable to describe other symptoms other than back pain, seated BP 143/107. Unable to remove abdominal binder due to TLSO placement. RN notified of pt symptoms and able to come into room to assist pt back to bed. Slide board transfer with assist x 2. Sit to supine assist x 2 for safety. Once back in bed  removed pt's abdominal binder and TLSO. Supine BP 123/83 and pt with no further complaints with regards to feeling "weird". Pt does continue to cry out due to back pain, RN offers pt medication and he is agreeable. Pt likely experienced AD while seated in w/c, unable to be receptive to education at this time due to pain but will continue with education during next therapy sessions. Pt left sidelying in bed with needs in reach in care of RN. Pt missed 15 min of scheduled therapy session due to pain.  Therapy Documentation Precautions:  Precautions Precautions: Fall Precaution Booklet Issued: No Precaution Comments: bil prevalon boots, TED hose, abdominal binder for BP support.  Required Braces or Orthoses: Spinal Brace Spinal Brace: Applied in supine position, Thoracolumbosacral orthotic Restrictions Weight Bearing Restrictions: No General: PT Amount of Missed Time (min): 15 Minutes PT Missed Treatment Reason: Pain    Therapy/Group: Individual Therapy   Peter Congo, PT, DPT  04/23/2020, 3:36 PM

## 2020-04-23 NOTE — Progress Notes (Addendum)
Patient ID: [name], male   DOB: 1991/01/20, 28 y.o.   MRN: [id number]  SW met with pt in room to provide updates on sample catheters being delivered. SW and pt discussed discharge. Pt d/c to home in [place]. SW discussed if this individual can come in before d/c date to practice care transfers; this is not an option. Car transfers will be practice on date of d/c. Pt states he will be going to a home in [place]. Pt is amenable to getting a w/c through [facility] and will put a card on file.   Address: [address] [place] [id number]  *SW later met with pt and pt mother to provide above updates. SW informed pt on a new pt appointment will be scheduled. Family education with pt mother on Saturday 9am-10am.   SW spoke with Ermalinda Barrios with [facility] [phone]) to schedule new pt appointment: Wednesday, June 30 at 9:20am with Kathe Becton, NP. SW provided pt with appointment card.  SW spoke with [employer] rep covering for [facility] who reported there will be follow-up with patient about delivering samples.   [name], MSW, [place] Office: [phone] Cell: [phone] Fax: [phone]

## 2020-04-23 NOTE — Progress Notes (Signed)
Pt called nursing staff to the room requesting pain medication. 10/10 back pain, heat packs given on prior shift. PRN oxy given to the pt per request. Upon assessment  Pt states " I feel like I am dying and I need you to help me" Pt denies CP, SOB, or pain other than his back. This nurse informed the pt that charge RN and the on call doc would be notified for further orders. Pt states " I feel like you are not taking me serious, I am going in and out of conscious and you are not doing anything for it." Pt was reassured that the provider will be notified for further orders.

## 2020-04-24 ENCOUNTER — Encounter (HOSPITAL_COMMUNITY): Payer: Self-pay | Admitting: Occupational Therapy

## 2020-04-24 MED ORDER — CEPHALEXIN 250 MG PO CAPS
500.0000 mg | ORAL_CAPSULE | Freq: Three times a day (TID) | ORAL | Status: DC
  Administered 2020-04-25: 500 mg via ORAL
  Filled 2020-04-24 (×3): qty 2

## 2020-04-24 MED ORDER — SODIUM CHLORIDE 0.9 % IV SOLN
1.0000 g | INTRAVENOUS | Status: DC
  Administered 2020-04-24: 1 g via INTRAVENOUS
  Filled 2020-04-24: qty 1

## 2020-04-24 MED ORDER — SODIUM CHLORIDE 0.9 % IV SOLN
1.0000 g | Freq: Two times a day (BID) | INTRAVENOUS | Status: DC
  Filled 2020-04-24 (×2): qty 10
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
## 2020-04-24 NOTE — Progress Notes (Signed)
Patient had an incontinent formed stool, moderate in amount. RN informed patient that he needs a digital stimulation and patient agreeable. Patient positioned on L lateral side and RN got small amount of soft stool. Pericare done assisted by nurse tech.

## 2020-04-24 NOTE — Progress Notes (Signed)
Patient refused to take Keflex capsule.  Patient stated he needs food in his stomach to take antibiotic. RN offered snack but patient refused. Patient stated he will take it tomorrow after eating breakfast. Charge RN informed.

## 2020-04-24 NOTE — Progress Notes (Signed)
Bowel program started. Large bm noted 

## 2020-04-24 NOTE — Progress Notes (Signed)
Patient adamant in taking his IV out claims it is hurting him explained to him that he will have another dose of ABT tomorrow he said they can place it back tomorrow. . IV flushes good and in good placement. Patient claims he will reap it out later. Leanne NT went to talk to patient.

## 2020-04-24 NOTE — Progress Notes (Signed)
RN went to give meds to patient; mother in room waiting for family education. NT was with RN and strong odor in room per NT it smelled like" marijuana" . CN made aware.

## 2020-04-24 NOTE — Progress Notes (Signed)
0800- NT reported to this writer that there was marijuana smell in pt's room. Spoke with Dr. Wynn Banker. Per Dr. Wynn Banker, if the smell occurs again, will have to do a search and remove any illegal substance out of room. Informed Dr. Larna Daughters that pt's behavior does not permit for nursing staff to search room. Pt can be violent and abusive towards nursing and it is unsafe for nursing staff to do so. Dr. Wynn Banker suggested calling security if room search is needed.   1740- No further smell noted in pt's room through out shift. Will inform oncoming staff.

## 2020-04-24 NOTE — Progress Notes (Signed)
   04/24/20 1655  Assess: MEWS Score  Temp (!) 101.2 F (38.4 C)  Assess: MEWS Score  MEWS Temp 1  MEWS Systolic 0  MEWS Pulse 1  MEWS RR 0  MEWS LOC 0  MEWS Score 2  MEWS Score Color Yellow  Assess: if the MEWS score is Yellow or Red  Were vital signs taken at a resting state? Yes  Focused Assessment Documented focused assessment  Early Detection of Sepsis Score *See Row Information* Medium  MEWS guidelines implemented *See Row Information* Yes  Treat  MEWS Interventions Administered scheduled meds/treatments;Administered prn meds/treatments  Take Vital Signs  Increase Vital Sign Frequency  Yellow: Q 2hr X 2 then Q 4hr X 2, if remains yellow, continue Q 4hrs  Escalate  MEWS: Escalate Yellow: discuss with charge nurse/RN and consider discussing with provider and RRT  Notify: Charge Nurse/RN  Name of Charge Nurse/RN Notified [email or web address]  Date Charge Nurse/RN Notified 04/24/20  Time Charge Nurse/RN Notified 1655  Notify: Provider  Provider Name/Title [email or web address]  Date Provider Notified 04/24/20  Time Provider Notified [phone]  Notification Type Call  Notification Reason Other (Comment)  Response See new orders  Date of Provider Response 04/24/20  Time of Provider Response 1817  Document  Patient Outcome Other (Comment)  Progress note created (see row info) Yes

## 2020-04-24 NOTE — Plan of Care (Signed)
  Problem: SCI BOWEL ELIMINATION Goal: RH STG MANAGE BOWEL WITH ASSISTANCE Description: STG Manage Bowel with max Assistance. Outcome: Not Progressing; bowel program   Problem: SCI BLADDER ELIMINATION Goal: RH STG MANAGE BLADDER WITH ASSISTANCE Description: STG Manage Bladder With min Assistance Outcome: Not Progressing; external device   Problem: RH KNOWLEDGE DEFICIT SCI Goal: RH STG INCREASE KNOWLEDGE OF SELF CARE AFTER SCI Description: Patient/caregiver will verbalize understanding of self care after SCI as outlined by MD and nursing staff including medications, bowel and bladder management, pain management, and follow up care with min assist. Outcome: Not Progressing;

## 2020-04-24 NOTE — Progress Notes (Signed)
Rechecked T 99.3. MD notified new orders noted. Patient has 2 blankets and refuses to remove them claims he feels cold. Temp in room 73 as well. Continued to monitor.

## 2020-04-24 NOTE — Progress Notes (Signed)
RN flushed IV to administer antibiotic and patient jerked his arm and stated the IV site hurts. IV removed. Patient refused insertion of another IV and requested RN to call MD to switch antibiotic to oral. Dr. Wynn Banker informed with order.

## 2020-04-24 NOTE — Progress Notes (Signed)
Scheduled tramadol given to the pt. All VS WNL. PT c/o of generalized pain and discomfort. When assessing the pt, pt is unable to specify exact location of discomfort. Pt states that he is upset b/c the doctor has not come to see him and he is "dying". When asked if there is any needs to be met the pt refuses to answer this nurse. Call light in reach.

## 2020-04-25 LAB — URINE CULTURE: Culture: >=100000 — AB

## 2020-04-25 MED ORDER — CEFTRIAXONE SODIUM 1 G IJ SOLR
1.0000 g | INTRAMUSCULAR | Status: DC
  Administered 2020-04-25: 1 g via INTRAMUSCULAR
  Filled 2020-04-25 (×2): qty 10

## 2020-04-25 MED ORDER — SODIUM CHLORIDE 0.9 % IV SOLN
1.0000 g | INTRAVENOUS | Status: DC
  Filled 2020-04-25: qty 10

## 2020-04-25 MED ORDER — CEFTRIAXONE SODIUM 1 G IJ SOLR
1.0000 g | INTRAMUSCULAR | Status: DC
  Filled 2020-04-25: qty 10

## 2020-04-25 NOTE — Progress Notes (Signed)
Bowel program done. Digitally stimulated with small BM and suppository inserted.

## 2020-04-25 NOTE — Progress Notes (Signed)
Staff did rounding on the pt. Pt resting in bed at this time. Pt still refusing to put up the top and bottom side rails on the bed. Call light in reach.

## 2020-04-25 NOTE — Progress Notes (Signed)
UCx showing ecoli sensitive to cetriaxone and cefazolin.  Received IM cetriaxone today.  Primary service to assess whether pt will be transitioned to po. Cefazolin tomorrow.

## 2020-04-25 NOTE — Plan of Care (Signed)
  Problem: SCI BLADDER ELIMINATION Goal: RH STG MANAGE BLADDER WITH ASSISTANCE Description: STG Manage Bladder With min Assistance Outcome: Progressing Goal: RH STG SCI MANAGE BLADDER PROGRAM W/ASSISTANCE Description: Patient/caregiver will verbalize understanding of bladder program and demonstrate proper bladder emptying techniques. Outcome: Progressing   Problem: RH KNOWLEDGE DEFICIT SCI Goal: RH STG INCREASE KNOWLEDGE OF SELF CARE AFTER SCI Description: Patient/caregiver will verbalize understanding of self care after SCI as outlined by MD and nursing staff including medications, bowel and bladder management, pain management, and follow up care with min assist. Outcome: Progressing

## 2020-04-25 NOTE — Progress Notes (Signed)
Pt requested for his [employer] bread food to be heated up. Pts wrapped sandwich heated and returned to the pt. Pt states all needs are met. Call light in reach.
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
## 2020-04-25 NOTE — Progress Notes (Signed)
Second digital stimulation, two hard lumps of stool removed. Nothing else removed at this time

## 2020-04-25 NOTE — Progress Notes (Signed)
Pt requested for staff to come in the room. This nurse and Thu, NT entered the room. Pt started mumbling " this night is going to be terrible by the looks of yall. I dont even know why I called yall in here". Thu, NT approached the PT to get the VS. Pt states " I truly do not want you in here at all, but I guess you can use this R arm."  VS all WNL. Pt had soiled brief,  was changed. Pt asked the NT in a loud tone, "di you even wipe me". Pt requested to lay on his side, pt continues to mumble rude comments under his breath while positioning him. Thu, NT was adjusting the pillow, pt states " you are no longer needed, so leave". This nurse educated the pt on how the inappropriate comments were not needed and would not be tolerated." Pt was positioned on his side with pillows between the legs. Pt refused to let staff put up the L side rails up. Pt had call light in reach, both personal cell phones on bed with the pt. Pt verbalized that all needs had been met.

## 2020-04-25 NOTE — Progress Notes (Signed)
Patient has been refusing his side rails on the left side up in spite explanation that it's for his safety and hospital policy (pt on specialty bed). This morning, on rounds at 6am, patient complaining he was in pain and his call bell was on the floor, on the L side. Pain meds administered and patient care done with NT.  Charge RN informed and talked to patient.

## 2020-04-25 NOTE — Progress Notes (Signed)
Patient refused vital signs check @ 0320 hrs per MEWS protocol. Will check later. Charge RN informed.

## 2020-04-26 ENCOUNTER — Inpatient Hospital Stay (HOSPITAL_COMMUNITY): Payer: Self-pay | Admitting: Physical Therapy

## 2020-04-26 ENCOUNTER — Inpatient Hospital Stay (HOSPITAL_COMMUNITY): Payer: Self-pay

## 2020-04-26 LAB — CBC WITH DIFFERENTIAL/PLATELET
Abs Immature Granulocytes: 0.03 10*3/uL (ref 0.00–0.07)
Basophils Absolute: 0 10*3/uL (ref 0.0–0.1)
Basophils Relative: 0 %
Eosinophils Absolute: 0.1 10*3/uL (ref 0.0–0.5)
Eosinophils Relative: 1 %
HCT: 35.7 % — ABNORMAL LOW (ref 39.0–52.0)
Hemoglobin: 11.3 g/dL — ABNORMAL LOW (ref 13.0–17.0)
Immature Granulocytes: 0 %
Lymphocytes Relative: 17 %
Lymphs Abs: 1.5 10*3/uL (ref 0.7–4.0)
MCH: 25.2 pg — ABNORMAL LOW (ref 26.0–34.0)
MCHC: 31.7 g/dL (ref 30.0–36.0)
MCV: 79.7 fL — ABNORMAL LOW (ref 80.0–100.0)
Monocytes Absolute: 1.3 10*3/uL — ABNORMAL HIGH (ref 0.1–1.0)
Monocytes Relative: 14 %
Neutro Abs: 6.1 10*3/uL (ref 1.7–7.7)
Neutrophils Relative %: 68 %
Platelets: 386 10*3/uL (ref 150–400)
RBC: 4.48 MIL/uL (ref 4.22–5.81)
RDW: 13.9 % (ref 11.5–15.5)
WBC: 9.1 10*3/uL (ref 4.0–10.5)
nRBC: 0 % (ref 0.0–0.2)

## 2020-04-26 LAB — BASIC METABOLIC PANEL
Anion gap: 11 (ref 5–15)
BUN: 16 mg/dL (ref 6–20)
CO2: 28 mmol/L (ref 22–32)
Calcium: 9.4 mg/dL (ref 8.9–10.3)
Chloride: 96 mmol/L — ABNORMAL LOW (ref 98–111)
Creatinine, Ser: 0.84 mg/dL (ref 0.61–1.24)
GFR calc Af Amer: >60 mL/min (ref >60)
GFR calc non Af Amer: >60 mL/min (ref >60)
Glucose, Bld: 94 mg/dL (ref 70–99)
Potassium: 4.1 mmol/L (ref 3.5–5.1)
Sodium: 135 mmol/L (ref 135–145)

## 2020-04-26 MED ORDER — CEFTRIAXONE SODIUM 1 G IJ SOLR
1.0000 g | INTRAMUSCULAR | Status: AC
  Administered 2020-04-26 – 2020-04-27 (×2): 1 g via INTRAMUSCULAR
  Filled 2020-04-26 (×2): qty 10

## 2020-04-26 NOTE — Progress Notes (Signed)
Pt requested for staff to come to the room.Upon entering the room, the pt was asleep. Pts VS, bladder scan, and repositioning was performed. Pt very calm and appreciative of the care being provided. Pt resting in bed at this time, call light in reach. Pt continues to refuse the L side- side rails to be placed up.

## 2020-04-26 NOTE — Progress Notes (Signed)
Physical Therapy Session Note  Patient Details  Name: [name] MRN: [id number] Date of Birth: 1991-09-26  Today's Date: 04/26/2020   Short Term Goals: Week 1:  PT Short Term Goal 1 (Week 1): Pt will perform bed mobility with min assist PT Short Term Goal 2 (Week 1): Pt will transf to and from [facility] with min assist consistently PT Short Term Goal 3 (Week 1): Pt will propel WC with supervision assist 236ft PT Short Term Goal 4 (Week 1): pt will maintain sitting balance EOB up to 5 mintues with CGA-supervision assist  Skilled Therapeutic Interventions/Progress Updates:   Pt received supine in bed. Pt encouraged pt to participate on OOB therapy and practice car transfer to prepare for d/c. Pt declined saying "Naw im good. I'm not getting out of bed today". Pt left in bed with all needs ment        Therapy Documentation Precautions:  Precautions Precautions: Fall Precaution Booklet Issued: No Precaution Comments: bil prevalon boots, TED hose, abdominal binder for BP support.  Required Braces or Orthoses: Spinal Brace Spinal Brace: Applied in supine position, Thoracolumbosacral orthotic Restrictions Weight Bearing Restrictions: No General: PT Amount of Missed Time (min): 60 Minutes PT Missed Treatment Reason: Patient unwilling to participate Vital Signs: Therapy Vitals Temp: 98.5 F (36.9 C) Temp Source: Oral Pulse Rate: 91 Resp: 16 BP: (!) 139/91 Patient Position (if appropriate): Lying Oxygen Therapy SpO2: 100 % O2 Device: Room Air Pain: Pain Assessment Pain Scale: 0-10 Pain Score: 4  Pain Type: Acute pain Pain Location: Back Pain Orientation: Lower Pain Descriptors / Indicators: Aching Pain Onset: On-going Patients Stated Pain Goal: 3 Pain Intervention(s): Medication (See eMAR)    Therapy/Group: Individual Therapy  [name] 04/26/2020, 3:45 PM

## 2020-04-26 NOTE — Plan of Care (Signed)
  Problem: Consults Goal: RH SPINAL CORD INJURY PATIENT EDUCATION Description:  See Patient Education module for education specifics.  Outcome: Progressing Goal: Skin Care Protocol Initiated - if Braden Score 18 or less Description: If consults are not indicated, leave blank or document N/A Outcome: Progressing   Problem: SCI BOWEL ELIMINATION Goal: RH STG MANAGE BOWEL WITH ASSISTANCE Description: STG Manage Bowel with max Assistance. Outcome: Progressing Goal: RH STG SCI MANAGE BOWEL PROGRAM W/ASSIST OR AS APPROPRIATE Description: STG SCI Manage bowel program w/max assist or as appropriate. Outcome: Progressing   Problem: SCI BLADDER ELIMINATION Goal: RH STG MANAGE BLADDER WITH ASSISTANCE Description: STG Manage Bladder With min Assistance Outcome: Progressing Goal: RH STG SCI MANAGE BLADDER PROGRAM W/ASSISTANCE Description: Patient/caregiver will verbalize understanding of bladder program and demonstrate proper bladder emptying techniques. Outcome: Progressing   Problem: RH SKIN INTEGRITY Goal: RH STG SKIN FREE OF INFECTION/BREAKDOWN Description: Patients skin will remain free from further infection or breakdown with min assist. Outcome: Progressing Goal: RH STG MAINTAIN SKIN INTEGRITY WITH ASSISTANCE Description: STG Maintain Skin Integrity With min Assistance. Outcome: Progressing   Problem: RH KNOWLEDGE DEFICIT SCI Goal: RH STG INCREASE KNOWLEDGE OF SELF CARE AFTER SCI Description: Patient/caregiver will verbalize understanding of self care after SCI as outlined by MD and nursing staff including medications, bowel and bladder management, pain management, and follow up care with min assist. Outcome: Progressing   

## 2020-04-26 NOTE — Progress Notes (Signed)
Physical Therapy Note  Patient Details  Name: [name] MRN: [id number] Date of Birth: 14-Dec-1990 Today's Date: 04/26/2020    Attempted to see patient for scheduled therapy session. Pt refuses participation and states he is not getting up this session. Encouraged pt to perform car transfer as he will need to perform this prior to d/c home, pt declines to perform this until his mother is present and able to assist with transfer. Pt unable to state when his mother will be available. Pt again encouraged to perform OOB mobility this session, he declines. Pt missed 60 min of scheduled therapy session due to refusal.    Peter Congo, PT, DPT  04/26/2020, 2:01 PM

## 2020-04-26 NOTE — Progress Notes (Signed)
Pt heard at the nurses station yell and using vulgar language. This nurse checked on the pt, pt appeared to be on the phone. Pt continued to yell and use inappropriate language on the phone, ignoring the nurse and NT that came to check on him.

## 2020-04-26 NOTE — Progress Notes (Signed)
Initiated bowel program by first performing digital stimulation and removing moderate amount of hard balls of stool.  Performed personal hygiene and inserted suppository.  Assisted patient back to comfortable position in bed.  Patient states he feels constipated, "my stomach hurts, my back hurts."  Says he will work on dietary changes to promote bowels moving once home.  Perhaps he could benefit from adjusting oral stool softeners/laxatives?  Dani Gobble, RN

## 2020-04-26 NOTE — Progress Notes (Signed)
Occupational Therapy Note  Patient Details  Name: JAPHETH DIEKMAN MRN: [id number] Date of Birth: 1991/03/21  Today's Date: 04/26/2020 OT Missed Time: 60 Minutes Missed Time Reason: Patient unwilling/refused to participate without medical reason  Pt missed 60 mins skilled OT services.  Pt on telephone when threapist entered room X 2. Each time pt told therapist to "come back later."    Rich Brave 04/26/2020, 12:16 PM
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
## 2020-04-26 NOTE — Progress Notes (Signed)
Physical Therapy Weekly Progress Note  Patient Details  Name: [name] MRN: [id number] Date of Birth: Aug 06, 1991  Beginning of progress report period: Apr 17, 2020 End of progress report period: Apr 26, 2020  Today's Date: 04/26/2020     Patient has met 4 of 4 short term goals.  Pt has made good progress and met all STG this week and is making progress towards reaching LTG. He is overall at CGA to min A for bed mobility and transfers via use of SB. Once seated in w/c pt is at Supervision level for w/c mobility. Pt however does demonstrate poor to fair unsupported sitting balance as well as decreased safety with transfers. Pt has been resistant to education with regards to his SCI as well as safe transfer methods and requests that therapists are "hands-off" when he is performing mobility. Pt poses a great fall risk due to his decreased safety awareness and impulsivity. Pt also frequently refuses therapy sessions without reason and exhibits decreased insight into his deficits and his need for therapy services.  Patient continues to demonstrate the following deficits muscle weakness and muscle paralysis, decreased cardiorespiratoy endurance, abnormal tone, unbalanced muscle activation and decreased coordination, decreased safety awareness and decreased sitting balance, decreased postural control, decreased balance strategies and difficulty maintaining precautions and therefore will continue to benefit from skilled PT intervention to increase functional independence with mobility.  Patient progressing toward long term goals..  Continue plan of care.  PT Short Term Goals Week 1:  PT Short Term Goal 1 (Week 1): Pt will perform bed mobility with min assist PT Short Term Goal 1 - Progress (Week 1): Met PT Short Term Goal 2 (Week 1): Pt will transf to and from [facility] with min assist consistently PT Short Term Goal 2 - Progress (Week 1): Met PT Short Term Goal 3 (Week 1): Pt will propel WC with  supervision assist [id number] PT Short Term Goal 3 - Progress (Week 1): Met PT Short Term Goal 4 (Week 1): pt will maintain sitting balance EOB up to 5 mintues with CGA-supervision assist PT Short Term Goal 4 - Progress (Week 1): Met Week 2:  PT Short Term Goal 1 (Week 2): =LTG due to ELOS   Therapy Documentation Precautions:  Precautions Precautions: Fall Precaution Booklet Issued: No Precaution Comments: bil prevalon boots, TED hose, abdominal binder for BP support.  Required Braces or Orthoses: Spinal Brace Spinal Brace: Applied in supine position, Thoracolumbosacral orthotic Restrictions Weight Bearing Restrictions: No   Therapy/Group: Individual Therapy   TExcell Seltzer PT, DPT 04/26/2020, 7:55 AM

## 2020-04-26 NOTE — Progress Notes (Signed)
Pt refused to let phlebotomy collect the morning labs as ordered. Provider will be notified during rounds.

## 2020-04-27 ENCOUNTER — Inpatient Hospital Stay (HOSPITAL_COMMUNITY): Payer: Self-pay

## 2020-04-27 ENCOUNTER — Inpatient Hospital Stay (HOSPITAL_COMMUNITY): Payer: Self-pay | Admitting: Physical Therapy

## 2020-04-27 ENCOUNTER — Inpatient Hospital Stay (HOSPITAL_COMMUNITY): Payer: Self-pay | Admitting: Occupational Therapy

## 2020-04-27 ENCOUNTER — Encounter (HOSPITAL_COMMUNITY): Payer: Self-pay | Admitting: Psychology

## 2020-04-27 MED ORDER — SENNOSIDES-DOCUSATE SODIUM 8.6-50 MG PO TABS
2.0000 | ORAL_TABLET | Freq: Every day | ORAL | Status: DC
  Administered 2020-04-28: 2 via ORAL
  Filled 2020-04-27: qty 2

## 2020-04-27 NOTE — Progress Notes (Signed)
Orthopedic Tech Progress Note Patient Details:  [name] 09/09/1991 [id number] Received a called requesting an ABDOMINAL BINDER left at bedside, patient said he will have someone apply after breakfast.  Ortho Devices Type of Ortho Device: Abdominal binder Ortho Device/Splint Location: stomach Ortho Device/Splint Interventions: Other (comment)   Post Interventions Patient Tolerated: Other (comment) Instructions Provided: Other (comment)   Donald Pore 04/27/2020, 8:13 AM
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
## 2020-04-27 NOTE — Plan of Care (Signed)
  Problem: Consults Goal: RH SPINAL CORD INJURY PATIENT EDUCATION Description:  See Patient Education module for education specifics.  Outcome: Progressing Goal: Skin Care Protocol Initiated - if Braden Score 18 or less Description: If consults are not indicated, leave blank or document N/A Outcome: Progressing   Problem: SCI BOWEL ELIMINATION Goal: RH STG MANAGE BOWEL WITH ASSISTANCE Description: STG Manage Bowel with max Assistance. Outcome: Progressing Goal: RH STG SCI MANAGE BOWEL PROGRAM W/ASSIST OR AS APPROPRIATE Description: STG SCI Manage bowel program w/max assist or as appropriate. Outcome: Progressing   Problem: SCI BLADDER ELIMINATION Goal: RH STG MANAGE BLADDER WITH ASSISTANCE Description: STG Manage Bladder With min Assistance Outcome: Progressing Goal: RH STG SCI MANAGE BLADDER PROGRAM W/ASSISTANCE Description: Patient/caregiver will verbalize understanding of bladder program and demonstrate proper bladder emptying techniques. Outcome: Progressing   Problem: RH SKIN INTEGRITY Goal: RH STG SKIN FREE OF INFECTION/BREAKDOWN Description: Patients skin will remain free from further infection or breakdown with min assist. Outcome: Progressing Goal: RH STG MAINTAIN SKIN INTEGRITY WITH ASSISTANCE Description: STG Maintain Skin Integrity With min Assistance. Outcome: Progressing   Problem: RH KNOWLEDGE DEFICIT SCI Goal: RH STG INCREASE KNOWLEDGE OF SELF CARE AFTER SCI Description: Patient/caregiver will verbalize understanding of self care after SCI as outlined by MD and nursing staff including medications, bowel and bladder management, pain management, and follow up care with min assist. Outcome: Progressing

## 2020-04-27 NOTE — Progress Notes (Signed)
Patient performed lovenox injection independently with supervision from this nurse with no issues.  Patient verbalized comfort of performing his own injections at home.  Dani Gobble, RN

## 2020-04-27 NOTE — Progress Notes (Signed)
Physical Therapy Session Note  Patient Details  Name: [name] MRN: [id number] Date of Birth: 1991-04-23  Today's Date: 04/27/2020 PT Individual Time: 1300-1357 PT Individual Time Calculation (min): 57 min   Short Term Goals: Week 2:  PT Short Term Goal 1 (Week 2): =LTG due to ELOS  Skilled Therapeutic Interventions/Progress Updates:   Pt in supine upon arrival, requesting therapist's assistance w/ bathing this session. Had pt practice directing his own care for bathing. Pt doffed and donned abdominal binder w/ supervision. Pt appropriately explained set-up to bathe from supine level, he maneuvered towels behind him in supine w/ set-up assist and R/L upper body rolling w/ supervision using bed rails. Performed upper body bathing in supine w/ supervision, w/ exception of total assist for back which pt directed therapist how to perform appropriately, R rolling w/ supervision, pt able to manipulate RLE w/ supervision as well into R sidelying. Moved on to lower body bathing, total assist to place towels under BLEs. Pt elevated bed into long-sitting and performed lower body bathing w/ supervision, he manipulated each LE w/ supervision in and out of circle sitting. Pt c/o mild dizziness in long-sitting and while supported by bed and abdominal binder, he declined therapist taking blood pressure. Pt helped to position BLEs to pt could clip toe nails, did so safely w/ supervision otherwise. Engaged pt in conversation during bathing to build therapeutic alliance and rapport, in attempts to increase pt buy-in towards physical therapy services in general. Discussed his children in particular and how he can engage them in his recovery. Pt appears motivated by his children and speaks very highly of them. Overall pt directed therapist in how to provide set-up assist of bathing well this session. Ended session in supine, all needs in reach. Pt pleasant and appreciative of therapist's assistance. NT made aware  that pt requested assistance to don new brief.   Therapy Documentation Precautions:  Precautions Precautions: Fall Precaution Booklet Issued: No Precaution Comments: reviewed precautions Required Braces or Orthoses: Spinal Brace Spinal Brace: Applied in supine position, Thoracolumbosacral orthotic Restrictions Weight Bearing Restrictions: No  Therapy/Group: Individual Therapy  [name] [name] 04/27/2020, 2:04 PM

## 2020-04-27 NOTE — Progress Notes (Signed)
Patient called to nurses station asking to speak with a supervisor.  This RN went to room to inquire about his request.  He says he is extremely frustrated and "just wants to get out of the bed."  No nursing transfer method written on safety plan.  Noted that patient was scheduled for therapy at 0945 in which he refused and dismissed the therapist.  Notified Blenda Peals, OT of request.  Reported he could do slideboard transfers with min assist.  Patient adamant about doing the transfer on his own.  He also refused to use the sliding board.  He asked me not to "touch him, that I would make it more difficult for him," however, during the transfer he had difficulty completing the transfer due to the wheelchair cushion in which he needed assistance for.  I also assisted him to pull up his shorts.  Patient then requested to have his BP checked, see doc flowsheets.  Discussed the need to allow [place] to help for safety purposes.  He seems frustrated that he needs help to do basic things.  I told him that he needs to allow me to help him because it is impossible for him to do everything independently at this point.  He reluctantly agreed and said he will work on asking for/allowing help in the future.  Dani Gobble, RN
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
## 2020-04-27 NOTE — Progress Notes (Addendum)
Patient ID: [name], male   DOB: [birth year], 29 y.o.   MRN: [id number]  SW left message for  [name]/[facility] [phone]) about charity referral for HHPT/OT/?SN. SW to clarify if SN is needed for catheter education.  *referral declined due to staffing in [place].   SW explored LOG options: Pending update from [facility] to see if they will accept LOG. *Declined due to staffing constraints.  LOG declines: [facility]- declined due to capacity [facility]- pending; not sure if [facility] will accept LOG but has declined in the past [facility]- does not accept LOG [facility]- declined due to staffing [facility]- declined [facility]-does not accept LOG [facility]- does not accept [facility]- declined due to staffing. Must call on Friday to see if any availability.  [facility]- declined as well unable to accept any more LOGs [facility]- does not accept LOG   *SW received updates from [employer] who reports that the office has been trying to call him but he has not answered and has no voicemail. [name] will try to call him. SW met with pt in room to provide updates. Pt was not in a good mood at time of visit with the updates he ws provided. SW provided pt with contact information for [employer] and liaison. SW called pt mother to provide updates on above.   [name], MSW, [place] Office: [phone] Cell: [phone] Fax: [phone]

## 2020-04-27 NOTE — Progress Notes (Signed)
As per recommendation during team conference, fixed patient a bag of sample urinary supplies.  RN demonstrated proper use of each sample given.  Dani Gobble, RN

## 2020-04-27 NOTE — Progress Notes (Signed)
Physical Therapy Discharge Summary  Patient Details  Name: [name] MRN: [id number] Date of Birth: 1991-08-20  Today's Date: 04/27/2020   Patient has met 4 of 6 long term goals due to improved activity tolerance, improved balance, improved postural control and ability to compensate for deficits.  Patient to discharge at a wheelchair level Supervision.   Patient's care partner unavailable to provide the necessary physical assistance at discharge. Pt declines multiple offers of hands on family education prior to d/c home. Pt reports he already knows what he needs to in order to take care of himself and resistant to further education.  Reasons goals not met: Pt did not meet transfer goal of Supervision level or car transfer goal of min A. He is at [facility] level for bed to/from chair transfers and refused to perform a car transfer prior to d/c home. Pt with a short LOS on rehab unit due to decreased participation and therapy buy in and pt requesting to d/c home as soon as possible. Pt resistant to education throughout his stay on rehab.  Recommendation:  Patient will benefit from ongoing skilled PT services in home health setting to continue to advance safe functional mobility, address ongoing impairments in endurance, strength, balance, safety, independence with functional mobility, and minimize fall risk.  Equipment: 16x18 w/c, 30" slide board, recommending hospital bed but pt declines use of one  Reasons for discharge: discharge from hospital  Patient/family agrees with progress made and goals achieved: Yes  PT Discharge Precautions/Restrictions Precautions Precautions: Fall Precaution Comments: reviewed precautions Required Braces or Orthoses: Spinal Brace Spinal Brace: Applied in supine position;Thoracolumbosacral orthotic Restrictions Weight Bearing Restrictions: No Vision/Perception  Perception Perception: Within Functional Limits Praxis Praxis: Intact  Cognition Overall  Cognitive Status: Within Functional Limits for tasks assessed Arousal/Alertness: Awake/alert Orientation Level: Oriented X4 Attention: Focused Focused Attention: Appears intact Sustained Attention: Appears intact Memory: Appears intact Awareness: Impaired Awareness Impairment: Anticipatory impairment Problem Solving: Appears intact Behaviors: Restless;Impulsive;Verbal agitation;Physical agitation;Poor frustration tolerance Safety/Judgment: Impaired Comments: decreased insight into deficits and functional implications Sensation Sensation Light Touch: Impaired Detail Light Touch Impaired Details: Impaired RLE;Impaired LLE Proprioception: Impaired Detail Proprioception Impaired Details: Absent LUE;Absent RLE Coordination Gross Motor Movements are Fluid and Coordinated: No Fine Motor Movements are Fluid and Coordinated: Yes Coordination and Movement Description: paraplegia at T10 level Motor  Motor Motor: Paraplegia;Abnormal tone Motor - Skilled Clinical Observations: T10 SCI with paraplegia Motor - Discharge Observations: T10 SCI with paraplegia  Mobility Bed Mobility Bed Mobility: Rolling Right;Rolling Left;Supine to Sit;Sit to Supine Rolling Right: Supervision/verbal cueing Rolling Left: Supervision/Verbal cueing Supine to Sit: Supervision/Verbal cueing Sit to Supine: Supervision/Verbal cueing Transfers Transfers: Lateral/Scoot Transfers Lateral/Scoot Transfers: Contact Guard/Touching assist Transfer (Assistive device): Other (Comment)(slide board) Locomotion  Gait Ambulation: No Gait Gait: No Stairs / Additional Locomotion Stairs: No Architect: Yes Wheelchair Assistance: Independent with Camera operator: Both upper extremities Wheelchair Parts Management: Needs assistance Distance: 200  Trunk/Postural Assessment  Cervical Assessment Cervical Assessment: Within Functional Limits Thoracic Assessment Thoracic  Assessment: Exceptions to WFL(back precautions) Lumbar Assessment Lumbar Assessment: Exceptions to WFL(back precautions) Postural Control Postural Control: Deficits on evaluation  Balance Balance Balance Assessed: Yes Static Sitting Balance Static Sitting - Balance Support: Bilateral upper extremity supported;Feet supported Static Sitting - Level of Assistance: 5: Stand by assistance Dynamic Sitting Balance Dynamic Sitting - Balance Support: No upper extremity supported;Feet supported;During functional activity Dynamic Sitting - Level of Assistance: 4: Min assist Extremity Assessment   RLE Assessment RLE Assessment: Exceptions  to [place] Strength Comments: 0/5 proximal to distal LLE Assessment LLE Assessment: Exceptions to [facility] General Strength Comments: 0/5     Excell Seltzer, PT, DPT 04/27/2020, 3:57 PM

## 2020-04-27 NOTE — Progress Notes (Signed)
Bowel program initiated.  Digitally removed large amount of hard round balls of stool.  Performed dig stim.  Inserted suppository and performed perineal hygiene.  Assisted patient to comfortable position laying on left side.  Awaiting results of suppository.  Dani Gobble, RN

## 2020-04-27 NOTE — Progress Notes (Signed)
Physical Therapy Session Note  Patient Details  Name: [name] MRN: [id number] Date of Birth: August 12, 1991  Today's Date: 04/27/2020 PT Individual Time: 2130-8657 PT Individual Time Calculation (min): 25 min  PT Missed Time: 35 min Missed Time Reason: patient refusal to participate  Short Term Goals: Week 2:  PT Short Term Goal 1 (Week 2): =LTG due to ELOS  Skilled Therapeutic Interventions/Progress Updates:    Attempted to see patient for scheduled therapy session. Pt initially declines participation and reports he is attempting to secure housing for his upcoming d/c. Pt agreeable to participate if therapist returns in 30 min. This therapist returns after 30 min and pt agreeable to sit up to EOB. Pt is setup A to don abdominal binder and TLSO in supine position. Assisted pt with doffing PRAFO boots and donning thigh high TED hose for time conservation. Pt is Supervision for supine to/from sit with HOB slightly elevated and use of hospital bed features. Pt exhibits good static sitting balance EOB with close SBA for safety. Pt declines any OOB transfers this date and declines any further participation in therapy session at this time. Pt with no further questions with regards to upcoming d/c and not receptive to further attempts at education. Pt left semi-reclined in bed with needs in reach at end of session. Pt missed 35 min of scheduled therapy session due to refusal.  Therapy Documentation Precautions:  Precautions Precautions: Fall Precaution Booklet Issued: No Precaution Comments: bil prevalon boots, TED hose, abdominal binder for BP support.  Required Braces or Orthoses: Spinal Brace Spinal Brace: Applied in supine position, Thoracolumbosacral orthotic Restrictions Weight Bearing Restrictions: No General: PT Amount of Missed Time (min): 35 Minutes PT Missed Treatment Reason: Patient unwilling to participate    Therapy/Group: Individual Therapy   Peter Congo, PT,  DPT  04/27/2020, 11:49 AM

## 2020-04-27 NOTE — Progress Notes (Signed)
Patient called me to room to check his BP.  When asked if he was having symptoms, he was very vague.  He denies dizziness, lightheadedness, etc. But couldn't voice what exactly his symptoms were, just he was "feeling a little something."  He requested to return to bed.  He allowed me to assist this time, and agreed to use the slideboard.  Transfer was much smoother this time.  Patient assisted to comfortable position in bed with all personal items and call bell within reach.  Dani Gobble, RN

## 2020-04-27 NOTE — Progress Notes (Signed)
Occupational Therapy Session Note  Patient Details  Name: [name] MRN: [id number] Date of Birth: 02/15/1991  Today's Date: 04/27/2020 OT Individual Time:  -     Entered patient room after patient responded to knocking on the door, introduced myself and was immediately dismissed.  Attempted to ask patient if I could come back to complete therapy session but he proceeded to use cell phone and pointed to the door.    Short Term Goals: Week 1:  OT Short Term Goal 1 (Week 1): STGs=LTGs due to ELOS  Skilled Therapeutic Interventions/Progress Updates:    none completed at this time due to patient refusal  Therapy Documentation Precautions:  Precautions Precautions: Fall Precaution Booklet Issued: No Precaution Comments: bil prevalon boots, TED hose, abdominal binder for BP support.  Required Braces or Orthoses: Spinal Brace Spinal Brace: Applied in supine position, Thoracolumbosacral orthotic Restrictions Weight Bearing Restrictions: No  Therapy/Group: Individual Therapy  Barrie Lyme 04/27/2020, 7:37 AM

## 2020-04-27 NOTE — Progress Notes (Signed)
Noted change in stool softener order.  Went to discuss with patient and he did not want to "be her [place] pig" taking something different right before going home.  Refused to take new senna ordered.  Dani Gobble, RN

## 2020-04-28 ENCOUNTER — Inpatient Hospital Stay (HOSPITAL_COMMUNITY): Payer: Self-pay

## 2020-04-28 MED ORDER — LIDOCAINE HCL URETHRAL/MUCOSAL 2 % EX GEL: CUTANEOUS | 0 refills | Status: DC | PRN

## 2020-04-28 MED ORDER — CIPROFLOXACIN HCL 250 MG PO TABS: 500.0000 mg | ORAL_TABLET | Freq: Two times a day (BID) | ORAL | Status: DC

## 2020-04-28 MED ORDER — ENOXAPARIN SODIUM 30 MG/0.3ML ~~LOC~~ SOLN: 30.0000 mg | Freq: Two times a day (BID) | SUBCUTANEOUS | 0 refills | Status: DC

## 2020-04-28 MED ORDER — BACLOFEN 20 MG PO TABS: 20.0000 mg | ORAL_TABLET | Freq: Three times a day (TID) | ORAL | 0 refills | Status: DC

## 2020-04-28 MED ORDER — ZOLPIDEM TARTRATE 5 MG PO TABS: 5.0000 mg | ORAL_TABLET | Freq: Every day | ORAL | 0 refills | Status: DC

## 2020-04-28 MED ORDER — DOCUSATE SODIUM 100 MG PO CAPS: 100.0000 mg | ORAL_CAPSULE | Freq: Every day | ORAL | 0 refills | Status: DC

## 2020-04-28 MED ORDER — OXYCODONE HCL 10 MG PO TABS: 15.0000 mg | ORAL_TABLET | Freq: Four times a day (QID) | ORAL | 0 refills | Status: DC | PRN

## 2020-04-28 MED ORDER — TRAMADOL HCL 50 MG PO TABS: 100.0000 mg | ORAL_TABLET | Freq: Four times a day (QID) | ORAL | 0 refills | Status: DC

## 2020-04-28 MED ORDER — CIPROFLOXACIN HCL 500 MG PO TABS: 500.0000 mg | ORAL_TABLET | Freq: Two times a day (BID) | ORAL | 0 refills | Status: AC

## 2020-04-28 MED ORDER — SENNOSIDES-DOCUSATE SODIUM 8.6-50 MG PO TABS: 2.0000 | ORAL_TABLET | Freq: Every day | ORAL | 0 refills | Status: DC

## 2020-04-28 MED ORDER — NAPHAZOLINE-GLYCERIN 0.012-0.2 % OP SOLN: 1.0000 [drp] | Freq: Three times a day (TID) | OPHTHALMIC | 0 refills | Status: DC

## 2020-04-28 MED ORDER — HYDROXYZINE HCL 50 MG PO TABS: 50.0000 mg | ORAL_TABLET | Freq: Three times a day (TID) | ORAL | 0 refills | Status: DC | PRN

## 2020-04-28 MED ORDER — CIPROFLOXACIN HCL 500 MG PO TABS
500.0000 mg | ORAL_TABLET | Freq: Two times a day (BID) | ORAL | 0 refills | Status: DC
Start: 2020-04-28 — End: 2020-04-28

## 2020-04-28 MED ORDER — BISACODYL 10 MG RE SUPP: 10.0000 mg | Freq: Every day | RECTAL | 0 refills | Status: DC

## 2020-04-28 MED ORDER — ACETAMINOPHEN 325 MG PO TABS: 325.0000 mg | ORAL_TABLET | ORAL | 0 refills | Status: DC | PRN

## 2020-04-28 MED ORDER — DIPHENHYDRAMINE-ZINC ACETATE 2-0.1 % EX CREA: 1.0000 "application " | TOPICAL_CREAM | Freq: Two times a day (BID) | CUTANEOUS | 0 refills | Status: DC | PRN

## 2020-04-28 MED FILL — LIDOCAINE HCL URETHRAL/MUCO: 2 | 30 days supply | Qty: 30 | Fill #0

## 2020-04-28 MED FILL — ENOXAPARIN 30 MG/0.3 ML SYR: 30 | 30 days supply | Qty: 18 | Fill #0

## 2020-04-28 MED FILL — DOCUSATE SODIUM 100 MG CAPS: 100 | 30 days supply | Qty: 30 | Fill #0

## 2020-04-28 MED FILL — oxyCODONE HCL 10 MG TABS: 10 | 7 days supply | Qty: 42 | Fill #0

## 2020-04-28 MED FILL — BISACODYL 10 MG SUPP: 10 | 30 days supply | Qty: 30 | Fill #0

## 2020-04-28 MED FILL — BANOPHEN ANTI-ITCH 2% CREAM: 2-0.1 | 10 days supply | Qty: 28 | Fill #0

## 2020-04-28 MED FILL — ZOLPIDEM TARTRATE 5 MG TAB: 5 | 30 days supply | Qty: 30 | Fill #0

## 2020-04-28 MED FILL — hydrOXYzine HCL 25 MG TABS: 25 | 15 days supply | Qty: 90 | Fill #0

## 2020-04-28 MED FILL — BACLOFEN 10 MG TABS: 10 | 27 days supply | Qty: 165 | Fill #0

## 2020-04-28 MED FILL — traMADol HCL 50 MG TABS: 50 | 9 days supply | Qty: 56 | Fill #0

## 2020-04-28 MED FILL — SENEXON-S 8.6-50 MG TABS: 8.6-50 | 30 days supply | Qty: 60 | Fill #0

## 2020-04-28 MED FILL — CIPROFLOXACIN HCL 500 MG TA: 500 | 5 days supply | Qty: 10 | Fill #0

## 2020-04-28 MED FILL — ACETAMINOPHEN 325 MG TABS: 325 | 12 days supply | Qty: 100 | Fill #0
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
## 2020-04-28 NOTE — Plan of Care (Signed)
  Problem: Consults Goal: RH SPINAL CORD INJURY PATIENT EDUCATION Description:  See Patient Education module for education specifics.  Outcome: Completed/Met Goal: Skin Care Protocol Initiated - if Braden Score 18 or less Description: If consults are not indicated, leave blank or document N/A Outcome: Completed/Met   Problem: SCI BOWEL ELIMINATION Goal: RH STG MANAGE BOWEL WITH ASSISTANCE Description: STG Manage Bowel with max Assistance. Outcome: Completed/Met Goal: RH STG SCI MANAGE BOWEL PROGRAM W/ASSIST OR AS APPROPRIATE Description: STG SCI Manage bowel program w/max assist or as appropriate. Outcome: Completed/Met   Problem: SCI BLADDER ELIMINATION Goal: RH STG MANAGE BLADDER WITH ASSISTANCE Description: STG Manage Bladder With min Assistance Outcome: Completed/Met Goal: RH STG SCI MANAGE BLADDER PROGRAM W/ASSISTANCE Description: Patient/caregiver will verbalize understanding of bladder program and demonstrate proper bladder emptying techniques. Outcome: Completed/Met   Problem: RH SKIN INTEGRITY Goal: RH STG SKIN FREE OF INFECTION/BREAKDOWN Description: Patients skin will remain free from further infection or breakdown with min assist. Outcome: Completed/Met Goal: RH STG MAINTAIN SKIN INTEGRITY WITH ASSISTANCE Description: STG Maintain Skin Integrity With min Assistance. Outcome: Completed/Met   Problem: RH KNOWLEDGE DEFICIT SCI Goal: RH STG INCREASE KNOWLEDGE OF SELF CARE AFTER SCI Description: Patient/caregiver will verbalize understanding of self care after SCI as outlined by MD and nursing staff including medications, bowel and bladder management, pain management, and follow up care with min assist. Outcome: Completed/Met
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
## 2020-04-28 NOTE — Discharge Instructions (Signed)
Inpatient Rehab Discharge Instructions  [name] Discharge date and time:  04/28/20   Activities/Precautions/ Functional Status: Activity: activity as tolerated--no strenuous activity.  Diet: regular diet Wound Care: none needed   Functional status:  ___ No restrictions     ___ Walk up steps independently _X__ 24/7 supervision/assistance   ___ Walk up steps with assistance ___ Intermittent supervision/assistance  ___ Bathe/dress independently ___ Walk with walker     _X__ Bathe/dress with assistance ___ Walk Independently    ___ Shower independently ___ Walk with assistance    ___ Shower with assistance _X__ No alcohol     ___ Return to work/school ________   COMMUNITY REFERRALS UPON DISCHARGE:    Medical Equipment/Items Ordered: wheelchair and slide board                                                 Agency/Supplier: [employer] [phone]  Medical Equipment/Items Ordered: catheters                                                  Agency/Supplier: [employer] liaison [name] [phone]; [employer]/Customer Service: [phone]    GENERAL COMMUNITY RESOURCES FOR PATIENT/FAMILY: New patient appointment on Wednesday, June 30 at 9:20am with [name], NP [address] [place], [place] [address] [phone]   Special Instructions: 1. Need to perform in and out catheterizations every 4-6 hours (5-6) times a day to keep bladder volumes less than 250 cc.  2. Continue Lovenox 30 mg every 12 hours through 06/30/2020 and stop 3. Use brace for back support when at edge of be or out of bed. Use abdominal binder to support blood pressure--should be tight when out of bed and looser when in bed. Wear support stocking when out of bed.   My questions have been answered and I understand these instructions. I will adhere to these goals and the provided educational materials after my discharge from the hospital.  Patient/Caregiver Signature  _______________________________ Date __________  Clinician Signature _______________________________________ Date __________  Please bring this form and your medication list with you to all your follow-up doctor's appointments.
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
## 2020-04-28 NOTE — Progress Notes (Signed)
Patient ID: [name], male   DOB: 1991/09/29, 29 y.o.   MRN: [id number]  SW received updates from [facility] that he has has made efforts to reach pt but still unsuccessful. SW met with pt in room to discuss catheter delivery. Pt requested catheters be delivered to: [address] [place], [place] [address]; size 63f. SW discussed with pt alternate options with being able to make any payments [facility] so he can have some assistance. Pt was upset and said "do you understand what charity is?" SW offered to explain to him again how our charity process works. SW explained to pt that  we were trying to help and if he could pay something that may help. He then asked SW "Why you continue to come in here?" SW reminded him as his case manager it was my job to provide him with all his options, and asked him did he want to explore this option. He declined. SW informed pt that we will continue to follow-up after discharge to see if there is an agency that will be willing to accept him for services.  SW spoke with his mother MLelan Ponsto inform on above about HH option. She continues to reiterate pt does not have any money and she is not able to help financially right now. She is also aware we will continue to follow and see if we are able to find an agency to assist.   *SW updated liaison on pt address and catheter size. Reports samples will be mailed out to patient.    ALoralee Pacas MSW, LMorning SunOffice: [phone]: [phone]: [phone]
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
## 2020-04-28 NOTE — Progress Notes (Signed)
Patient was discharged from  [phone].  Patient left floor via wheelchair escorted by therapy staff and sister.  Patient verbalized understanding of discharge instructions as given by Marissa Nestle, PA.  All patient belongings sent with patient including DME and medications as provided by [facility] pharmacy.  Patient appears to be in no immediate distress at this time.    Dani Gobble, RN

## 2020-04-29 ENCOUNTER — Encounter (HOSPITAL_COMMUNITY): Payer: Self-pay | Admitting: Emergency Medicine

## 2020-04-29 DIAGNOSIS — D62 Acute posthemorrhagic anemia: Secondary | ICD-10-CM

## 2020-04-29 DIAGNOSIS — N319 Neuromuscular dysfunction of bladder, unspecified: Secondary | ICD-10-CM

## 2020-04-29 DIAGNOSIS — R252 Cramp and spasm: Secondary | ICD-10-CM

## 2020-04-29 DIAGNOSIS — K592 Neurogenic bowel, not elsewhere classified: Secondary | ICD-10-CM

## 2020-04-29 LAB — CULTURE, BLOOD (ROUTINE X 2)
Culture: NO GROWTH
Culture: NO GROWTH
Special Requests: ADEQUATE

## 2020-04-29 NOTE — Progress Notes (Signed)
Occupational Therapy Discharge Summary  Patient Details  Name: [name] MRN: [id number] Date of Birth: 11-08-1991     Patient has met 2 of 10 long term goals due to ability to compensate for deficits.  He was able to perform self care tasks at a high level due to flexibility and UB strength but declined to practice or take suggestions by staff to improve performance or attempt in other positions out of bed.  Patient to discharge at [facility] Assist level.  Patient to go home with family assistance.  Education opportunities limited due to patient refusal to participate.      Reasons goals not met:    Patient refused activities during sessions completed and often refused therapy interaction completely   Recommendation:  Patient will benefit from ongoing skilled OT services to continue to advance functional skills in the area of BADL, iADL, Vocation and Reduce care partner burden but will need to agree to participation   Equipment: recommended drop arm commode for bowel program but understand that he will complete at bed level upon return home at this time  Reasons for discharge: refusal of 3 consecutive treatment sessions without medical reason  Patient/family agrees with progress made and goals achieved: Yes  OT Discharge Precautions/Restrictions  Precautions Precautions: Fall Required Braces or Orthoses: Spinal Brace Spinal Brace: Applied in supine position;Thoracolumbosacral orthotic   ADL ADL Eating: Independent Grooming: Setup Where Assessed-Grooming: Bed level Upper Body Bathing: Setup Where Assessed-Upper Body Bathing: Bed level Lower Body Bathing: Setup Where Assessed-Lower Body Bathing: Bed level Upper Body Dressing: Setup Where Assessed-Upper Body Dressing: Bed level Lower Body Dressing: Supervision/safety Where Assessed-Lower Body Dressing: Bed level Toileting: Not assessed Toilet Transfer: Contact guard Toilet Transfer Method: Squat pivot Toilet  Transfer Equipment: Drop arm bedside commode Tub/Shower Transfer: Not assessed Vision Baseline Vision/History: No visual deficits Vision Assessment?: No apparent visual deficits Cognition Overall Cognitive Status: Within Functional Limits for tasks assessed Safety/Judgment: Impaired Comments: decreased insight into deficits and functional implications Sensation Coordination Fine Motor Movements are Fluid and Coordinated: Yes Motor  Motor Motor - Discharge Observations: T10 SCI with paraplegia Mobility  Bed Mobility Rolling Right: Supervision/verbal cueing Rolling Left: Supervision/Verbal cueing Supine to Sit: Supervision/Verbal cueing Sit to Supine: Supervision/Verbal cueing     Balance Static Sitting Balance Static Sitting - Level of Assistance: 5: Stand by assistance Extremity/Trunk Assessment RUE Assessment RUE Assessment: Within Functional Limits LUE Assessment LUE Assessment: Within Functional Limits   Abbott Jasinski A Brynley Cuddeback 04/29/2020, 5:00 PM

## 2020-04-30 ENCOUNTER — Telehealth: Payer: Self-pay | Admitting: Registered Nurse

## 2020-04-30 NOTE — Telephone Encounter (Signed)
Transitional Care call Transitional Questions Answerd by his Mother Ms. Marie  Patient name: [name]  DOB: [birth year] 1. Are you/is patient experiencing any problems since coming home? No a. Are there any questions regarding any aspect of care? No 2. Are there any questions regarding medications administration/dosing? No a. Are meds being taken as prescribed? Yes b. "Patient should review meds with caller to confirm"  Medication List Reviewed. 3. Have there been any falls? No 4. Has Home Health been to the house and/or have they contacted you? SW: Were Unable to obtain [facility] under [facility] a. If not, have you tried to contact them? NA b. Can we help you contact them? NA 5. Are bowels and bladder emptying properly? Yes a. Are there any unexpected incontinence issues? No b. If applicable, is patient following bowel/bladder programs? Yes 6. Any fevers, problems with breathing, unexpected pain? No 7. Are there any skin problems or new areas of breakdown? No 8. Has the patient/family member arranged specialty MD follow up (ie cardiology/neurology/renal/surgical/etc.)?  Ms. Ringer was instructed to call Dr Yetta Barre to scheduled HFU appointment, she verbalizes understanding.  a. Can we help arrange? No 9. Does the patient need any other services or support that we can help arrange? No 10. Are caregivers following through as expected in assisting the patient? Yes 11. Has the patient quit smoking, drinking alcohol, or using drugs as recommended? (                        )  Appointment date/time 05/07/2020  arrival time 2:20 for 2:40 appointment with Dr Berline Chough. At [address] [facility] [address]

## 2020-05-05 ENCOUNTER — Telehealth: Payer: Self-pay

## 2020-05-05 NOTE — Telephone Encounter (Signed)
SW followed up with pt to determine if he d/c to a location in [place]. Pt states he is not in [place], here in [place]. SW discussed with tp if he were still interested in HHPT/OT/SN. Pt is still interested. Pt states d/c location is [address] Shaune Pollack [place], [place] [address]. SW informed will continue to work on [facility] and will follow-up.

## 2020-05-07 ENCOUNTER — Other Ambulatory Visit: Payer: Self-pay

## 2020-05-07 ENCOUNTER — Encounter
Payer: Medicaid Other | Attending: Physical Medicine and Rehabilitation | Admitting: Physical Medicine and Rehabilitation

## 2020-05-07 ENCOUNTER — Encounter: Payer: Self-pay | Admitting: Physical Medicine and Rehabilitation

## 2020-05-07 VITALS — BP 128/84 | HR 95 | Temp 97.2°F | Ht 71.0 in

## 2020-05-07 DIAGNOSIS — K592 Neurogenic bowel, not elsewhere classified: Secondary | ICD-10-CM | POA: Insufficient documentation

## 2020-05-07 DIAGNOSIS — G8220 Paraplegia, unspecified: Secondary | ICD-10-CM | POA: Diagnosis present

## 2020-05-07 DIAGNOSIS — R252 Cramp and spasm: Secondary | ICD-10-CM | POA: Diagnosis present

## 2020-05-07 DIAGNOSIS — Z79891 Long term (current) use of opiate analgesic: Secondary | ICD-10-CM | POA: Diagnosis present

## 2020-05-07 DIAGNOSIS — N319 Neuromuscular dysfunction of bladder, unspecified: Secondary | ICD-10-CM | POA: Insufficient documentation

## 2020-05-07 DIAGNOSIS — G894 Chronic pain syndrome: Secondary | ICD-10-CM | POA: Insufficient documentation

## 2020-05-07 DIAGNOSIS — Z5181 Encounter for therapeutic drug level monitoring: Secondary | ICD-10-CM | POA: Insufficient documentation

## 2020-05-07 DIAGNOSIS — N318 Other neuromuscular dysfunction of bladder: Secondary | ICD-10-CM

## 2020-05-07 MED ORDER — BACLOFEN 20 MG PO TABS: 20.0000 mg | ORAL_TABLET | Freq: Three times a day (TID) | ORAL | 11 refills | Status: DC

## 2020-05-07 MED ORDER — TRAMADOL HCL 50 MG PO TABS: 100.0000 mg | ORAL_TABLET | Freq: Four times a day (QID) | ORAL | 0 refills | Status: DC

## 2020-05-07 MED ORDER — OXYCODONE HCL 10 MG PO TABS: 15.0000 mg | ORAL_TABLET | Freq: Four times a day (QID) | ORAL | 0 refills | Status: DC | PRN

## 2020-05-07 NOTE — Patient Instructions (Signed)
Pt is a 29 yr old male with T10 ASIA S paraplegia, neurogenic bowel and bladder and back pain here for hospital follow up.     1. Take Lovenox until 06/29/20- for the first 90 days.   2. Needs more Tramadol and Oxycodone- only got 1 week in hospital.  Sent 15 mg 6 hours prn of Oxycodone- and tramadol 100 mg q6 hours prn- #240- no RFs  3. Opiate contract and oral drug screen because is SCI pt.    4, Discussed he's doing great with bowel program 5. Suggest in/out cathing at least 2-3x/day- goal 4x/day - to prevent kidney destruction and improve chance of getting UTIs.  Can use condom catheter in between.    6. F/U in 6-8 weeks.

## 2020-05-07 NOTE — Progress Notes (Signed)
Subjective:    Patient ID: [name], male    DOB: 30-Jan-1991, 30 y.o.   MRN: [id number]  HPI   Pt is a 29 yr old male with T10 ASIA S paraplegia, neurogenic bowel and bladder and back pain here for hospital follow up.    Things going OK  Pain been ~ 5-6/10- most of that pain is in back-  Taking pain meds some.   Got condom catheter- not cathing- Mother wanted him to do condom cath-  Bowels- doing bowel program- doing own bowel program.  Has had BM every day except 1x since got back.   Wears abd binder at all times and Wears brace/TLSO on when up-   Skin doing OK/staying dry.   R ankle- deep cold in bones- can feel when cold.  Since yesterday felt cold- Cold/aching  Don't have a PCP yet.  Has appointment 6/25 to set up for PCP.    Still taking Baclofen - not getting spasms too much- legs jump, twitch still- not too nannoying but also not painful.  Taking Lovenox injections-   Takes oxycodone and naproxen and then next time, tramadol and then back to Naproxen/Oxycodone. Has a few pills left- got filled on     Pain Inventory Average Pain 6 Pain Right Now 5 My pain is intermittent and burning  In the last 24 hours, has pain interfered with the following? General activity 0 Relation with others 5 Enjoyment of life 9 What TIME of day is your pain at its worst? night Sleep (in general) Good  Pain is worse with: unsure and doesnt walk Pain improves with: medication Relief from Meds: 9  Mobility use a wheelchair needs help with transfers  Function I need assistance with the following:  dressing, bathing, toileting, meal prep, household duties and shopping  Neuro/Psych bowel control problems weakness numbness tremor tingling trouble walking spasms depression anxiety  Prior Studies Any changes since last visit?  no  Physicians involved in your care Any changes since last visit?  no   Family History  Problem Relation Age of Onset  . High  blood pressure Mother    Social History   Socioeconomic History  . Marital status: Single    Spouse name: Not on file  . Number of children: Not on file  . Years of education: Not on file  . Highest education level: Not on file  Occupational History  . Not on file  Tobacco Use  . Smoking status: Never Smoker  . Smokeless tobacco: Never Used  Vaping Use  . Vaping Use: Never used  Substance and Sexual Activity  . Alcohol use: Yes  . Drug use: Yes    Types: Marijuana  . Sexual activity: Yes    Partners: Female    Birth control/protection: None  Other Topics Concern  . Not on file  Social History Narrative   ** Merged History Encounter **       Social Determinants of Health   Financial Resource Strain:   . Difficulty of Paying Living Expenses:   Food Insecurity:   . Worried About Programme researcher, broadcasting/film/video in the Last Year:   . Barista in the Last Year:   Transportation Needs:   . Freight forwarder (Medical):   Marland Kitchen Lack of Transportation (Non-Medical):   Physical Activity:   . Days of Exercise per Week:   . Minutes of Exercise per Session:   Stress:   . Feeling of Stress :  Social Connections:   . Frequency of Communication with Friends and Family:   . Frequency of Social Gatherings with Friends and Family:   . Attends Religious Services:   . Active Member of Clubs or Organizations:   . Attends Archivist Meetings:   Marland Kitchen Marital Status:    No past surgical history on file. No past medical history on file. BP 128/84   Pulse 95   Temp (!) 97.2 F (36.2 C)   Ht 5\' 11"  (1.803 m)   SpO2 97%   BMI 22.18 kg/m   Opioid Risk Score:   Fall Risk Score:  `1  Depression screen PHQ 2/9  Depression screen PHQ 2/9 05/07/2020  Decreased Interest 3  Down, Depressed, Hopeless 2  PHQ - 2 Score 5  Altered sleeping 3  Tired, decreased energy 3  Change in appetite 1  Feeling bad or failure about yourself  2  Trouble concentrating 0  Moving slowly or  fidgety/restless 0  Suicidal thoughts 1  PHQ-9 Score 15  Difficult doing work/chores Extremely dIfficult     Review of Systems  Constitutional: Positive for appetite change.       Poor  HENT: Negative.   Eyes: Negative.   Gastrointestinal:       Bowel control  Endocrine: Negative.   Genitourinary:       Foley  Musculoskeletal:       Spasms  Skin: Negative.   Allergic/Immunologic: Negative.   Neurological: Positive for tremors, weakness and numbness.       Tingling  Hematological: Bruises/bleeds easily.       Lovenox  Psychiatric/Behavioral: Positive for dysphoric mood. The patient is nervous/anxious.   All other systems reviewed and are negative.      Objective:   Physical Exam Awake, alert, appropriate, accompanied by mother, NAD Has condom catheter in place RRR CTA b/l Soft, NT, ND, (+)BS Able to pull from abdominal muscles- can lift HF slightly - not sure if abds or HF? Otherwise 0/5 in LEs- KE/DF and PF  Neuro: Few beats clonus in RLE- 2-3 beats LLE- MAS of 1 in LEs-         Assessment & Plan:   Pt is a 29 yr old male with T10 ASIA S paraplegia, neurogenic bowel and bladder and back pain here for hospital follow up.     1. Take Lovenox until 06/29/20- for the first 90 days.   2. Needs more Tramadol and Oxycodone- only got 1 week in hospital.  Sent 15 mg 6 hours prn of Oxycodone- and tramadol 100 mg q6 hours prn- #240- no RFs  3. Opiate contract and oral drug screen because is SCI pt.    4, Discussed he's doing great with bowel program 5. Suggest in/out cathing at least 2-3x/day- goal 4x/day - to prevent kidney destruction and improve chance of getting UTIs.  Can use condom catheter in between.    6. F/U in 6-8 weeks.    I spent a total of 25 minutes on visit.

## 2020-05-08 ENCOUNTER — Other Ambulatory Visit: Payer: Self-pay

## 2020-05-09 ENCOUNTER — Emergency Department (HOSPITAL_COMMUNITY)
Admission: EM | Admit: 2020-05-09 | Discharge: 2020-05-09 | Disposition: A | Discharge status: Home / Self Care | Payer: Medicaid Other | Attending: Emergency Medicine | Admitting: Emergency Medicine

## 2020-05-09 ENCOUNTER — Other Ambulatory Visit: Payer: Self-pay

## 2020-05-09 DIAGNOSIS — G8220 Paraplegia, unspecified: Secondary | ICD-10-CM | POA: Insufficient documentation

## 2020-05-09 DIAGNOSIS — R4182 Altered mental status, unspecified: Secondary | ICD-10-CM | POA: Insufficient documentation

## 2020-05-09 DIAGNOSIS — F1290 Cannabis use, unspecified, uncomplicated: Secondary | ICD-10-CM | POA: Diagnosis not present

## 2020-05-09 DIAGNOSIS — R55 Syncope and collapse: Secondary | ICD-10-CM

## 2020-05-09 LAB — CBC WITH DIFFERENTIAL/PLATELET
Abs Immature Granulocytes: 0.06 10*3/uL (ref 0.00–0.07)
Basophils Absolute: 0 10*3/uL (ref 0.0–0.1)
Basophils Relative: 0 %
Eosinophils Absolute: 0 10*3/uL (ref 0.0–0.5)
Eosinophils Relative: 0 %
HCT: 44.7 % (ref 39.0–52.0)
Hemoglobin: 13.6 g/dL (ref 13.0–17.0)
Immature Granulocytes: 0 %
Lymphocytes Relative: 8 %
Lymphs Abs: 1.4 10*3/uL (ref 0.7–4.0)
MCH: 25.4 pg — ABNORMAL LOW (ref 26.0–34.0)
MCHC: 30.4 g/dL (ref 30.0–36.0)
MCV: 83.4 fL (ref 80.0–100.0)
Monocytes Absolute: 0.9 10*3/uL (ref 0.1–1.0)
Monocytes Relative: 5 %
Neutro Abs: 14.8 10*3/uL — ABNORMAL HIGH (ref 1.7–7.7)
Neutrophils Relative %: 87 %
Platelets: 473 10*3/uL — ABNORMAL HIGH (ref 150–400)
RBC: 5.36 MIL/uL (ref 4.22–5.81)
RDW: 15.6 % — ABNORMAL HIGH (ref 11.5–15.5)
WBC: 17.2 10*3/uL — ABNORMAL HIGH (ref 4.0–10.5)
nRBC: 0 % (ref 0.0–0.2)

## 2020-05-09 LAB — BASIC METABOLIC PANEL
Anion gap: 12 (ref 5–15)
BUN: 18 mg/dL (ref 6–20)
CO2: 25 mmol/L (ref 22–32)
Calcium: 9.5 mg/dL (ref 8.9–10.3)
Chloride: 100 mmol/L (ref 98–111)
Creatinine, Ser: 0.83 mg/dL (ref 0.61–1.24)
GFR calc Af Amer: >60 mL/min (ref >60)
GFR calc non Af Amer: >60 mL/min (ref >60)
Glucose, Bld: 110 mg/dL — ABNORMAL HIGH (ref 70–99)
Potassium: 5.3 mmol/L — ABNORMAL HIGH (ref 3.5–5.1)
Sodium: 137 mmol/L (ref 135–145)

## 2020-05-09 LAB — CBG MONITORING, ED: Glucose-Capillary: 81 mg/dL (ref 70–99)

## 2020-05-09 MED ORDER — SODIUM CHLORIDE 0.9 % IV BOLUS
1000.0000 mL | Freq: Once | INTRAVENOUS | Status: AC
  Administered 2020-05-09: 1000 mL via INTRAVENOUS

## 2020-05-09 NOTE — ED Notes (Signed)
All discharge instructions reviewed with pt. Denies questions, pt discharged without issue.

## 2020-05-09 NOTE — Discharge Instructions (Addendum)
Follow-up with your primary doctor and return to the emergency department if symptoms significantly worsen or change.
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
## 2020-05-14 LAB — DRUG TOX MONITOR 1 W/CONF, ORAL FLD
Amphetamines: NEGATIVE ng/mL (ref <10)
Barbiturates: NEGATIVE ng/mL (ref <10)
Benzodiazepines: NEGATIVE ng/mL (ref <0.50)
Buprenorphine: NEGATIVE ng/mL (ref <0.10)
Cocaine: NEGATIVE ng/mL (ref <5.0)
Codeine: NEGATIVE ng/mL (ref <2.5)
Cotinine: 32.4 ng/mL — ABNORMAL HIGH (ref <5.0)
Dihydrocodeine: NEGATIVE ng/mL (ref <2.5)
Fentanyl: NEGATIVE ng/mL (ref <0.10)
Heroin Metabolite: NEGATIVE ng/mL (ref <1.0)
Hydrocodone: NEGATIVE ng/mL (ref <2.5)
Hydromorphone: NEGATIVE ng/mL (ref <2.5)
MARIJUANA: POSITIVE ng/mL — AB (ref <2.5)
MDMA: NEGATIVE ng/mL (ref <10)
Meprobamate: NEGATIVE ng/mL (ref <2.5)
Methadone: NEGATIVE ng/mL (ref <5.0)
Morphine: NEGATIVE ng/mL (ref <2.5)
Nicotine Metabolite: POSITIVE ng/mL — AB (ref <5.0)
Norhydrocodone: NEGATIVE ng/mL (ref <2.5)
Noroxycodone: 68.2 ng/mL — ABNORMAL HIGH (ref <2.5)
Opiates: POSITIVE ng/mL — AB (ref <2.5)
Oxycodone: 135.8 ng/mL — ABNORMAL HIGH (ref <2.5)
Oxymorphone: NEGATIVE ng/mL (ref <2.5)
Phencyclidine: NEGATIVE ng/mL (ref <10)
THC: 58.5 ng/mL — ABNORMAL HIGH (ref <2.5)
Tapentadol: NEGATIVE ng/mL (ref <5.0)
Tramadol: >500 ng/mL — ABNORMAL HIGH (ref <5.0)
Tramadol: POSITIVE ng/mL — AB (ref <5.0)
Zolpidem: NEGATIVE ng/mL (ref <5.0)

## 2020-05-14 LAB — DRUG TOX ALC METAB W/CON, ORAL FLD: Alcohol Metabolite: NEGATIVE ng/mL (ref <25)

## 2020-05-17 ENCOUNTER — Telehealth: Payer: Self-pay | Admitting: *Deleted

## 2020-05-17 NOTE — Telephone Encounter (Signed)
Oral swab drug screen was consistent for his oxycodone and tramadol which has been prescribed., but it is also  positive for Marijuana/THC.
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
## 2020-05-18 NOTE — Telephone Encounter (Signed)
A formal warning was sent through MyChart and will be sent through [employer] as well.

## 2020-05-19 ENCOUNTER — Telehealth: Payer: Self-pay
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
## 2020-05-21 ENCOUNTER — Ambulatory Visit: Payer: Self-pay | Admitting: Family Medicine

## 2020-05-26 ENCOUNTER — Other Ambulatory Visit: Payer: Self-pay

## 2020-05-26 ENCOUNTER — Ambulatory Visit (INDEPENDENT_AMBULATORY_CARE_PROVIDER_SITE_OTHER): Payer: Self-pay | Admitting: Family Medicine

## 2020-05-26 ENCOUNTER — Encounter: Payer: Self-pay | Admitting: Family Medicine

## 2020-05-26 VITALS — BP 128/83 | HR 132 | Temp 98.1°F | Ht 72.0 in

## 2020-05-26 DIAGNOSIS — Z0000 Encounter for general adult medical examination without abnormal findings: Secondary | ICD-10-CM

## 2020-05-26 DIAGNOSIS — Y249XXA Unspecified firearm discharge, undetermined intent, initial encounter: Secondary | ICD-10-CM

## 2020-05-26 DIAGNOSIS — Z09 Encounter for follow-up examination after completed treatment for conditions other than malignant neoplasm: Secondary | ICD-10-CM

## 2020-05-26 DIAGNOSIS — N529 Male erectile dysfunction, unspecified: Secondary | ICD-10-CM

## 2020-05-26 DIAGNOSIS — G8220 Paraplegia, unspecified: Secondary | ICD-10-CM

## 2020-05-26 DIAGNOSIS — K592 Neurogenic bowel, not elsewhere classified: Secondary | ICD-10-CM

## 2020-05-26 DIAGNOSIS — S24109D Unspecified injury at unspecified level of thoracic spinal cord, subsequent encounter: Secondary | ICD-10-CM

## 2020-05-26 DIAGNOSIS — S24109A Unspecified injury at unspecified level of thoracic spinal cord, initial encounter: Secondary | ICD-10-CM

## 2020-05-26 DIAGNOSIS — N319 Neuromuscular dysfunction of bladder, unspecified: Secondary | ICD-10-CM

## 2020-05-26 DIAGNOSIS — W3400XA Accidental discharge from unspecified firearms or gun, initial encounter: Secondary | ICD-10-CM

## 2020-05-26 DIAGNOSIS — L89151 Pressure ulcer of sacral region, stage 1: Secondary | ICD-10-CM

## 2020-05-26 DIAGNOSIS — Z7689 Persons encountering health services in other specified circumstances: Secondary | ICD-10-CM

## 2020-05-26 LAB — POCT GLYCOSYLATED HEMOGLOBIN (HGB A1C)
HbA1c POC (<> result, manual entry): 5.7 % (ref 4.0–5.6)
HbA1c, POC (controlled diabetic range): 5.7 % (ref 0.0–7.0)
HbA1c, POC (prediabetic range): 5.7 % (ref 5.7–6.4)
Hemoglobin A1C: 5.7 % — AB (ref 4.0–5.6)

## 2020-05-26 LAB — GLUCOSE, POCT (MANUAL RESULT ENTRY): POC Glucose: 123 mg/dl — AB (ref 70–99)

## 2020-05-26 MED ORDER — SILDENAFIL CITRATE 100 MG PO TABS: 50.0000 mg | ORAL_TABLET | Freq: Every day | ORAL | 1 refills | Status: DC | PRN

## 2020-05-26 MED FILL — SILDENAFIL CITRATE 100 MG T: 100 | 30 days supply | Qty: 5 | Fill #0
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
## 2020-05-26 NOTE — Patient Instructions (Signed)
Sildenafil tablets (Erectile Dysfunction) What is this medicine? SILDENAFIL (sil DEN a fil) is used to treat erection problems in men. This medicine may be used for other purposes; ask your health care provider or pharmacist if you have questions. COMMON BRAND NAME(S): Viagra What should I tell my health care provider before I take this medicine? They need to know if you have any of these conditions:  bleeding disorders  eye or vision problems, including a rare inherited eye disease called retinitis pigmentosa  anatomical deformation of the penis, Peyronie's disease, or history of priapism (painful and prolonged erection)  heart disease, angina, a history of heart attack, irregular heart beats, or other heart problems  high or low blood pressure  history of blood diseases, like sickle cell anemia or leukemia  history of stomach bleeding  kidney disease  liver disease  stroke  an unusual or allergic reaction to sildenafil, other medicines, foods, dyes, or preservatives  pregnant or trying to get pregnant  breast-feeding How should I use this medicine? Take this medicine by mouth with a glass of water. Follow the directions on the prescription label. The dose is usually taken 1 hour before sexual activity. You should not take the dose more than once per day. Do not take your medicine more often than directed. Talk to your pediatrician regarding the use of this medicine in children. This medicine is not used in children for this condition. Overdosage: If you think you have taken too much of this medicine contact a poison control center or emergency room at once. NOTE: This medicine is only for you. Do not share this medicine with others. What if I miss a dose? This does not apply. Do not take double or extra doses. What may interact with this medicine? Do not take this medicine with any of the following medications:  cisapride  nitrates like amyl nitrite, isosorbide  dinitrate, isosorbide mononitrate, nitroglycerin  riociguat This medicine may also interact with the following medications:  antiviral medicines for HIV or AIDS  bosentan  certain medicines for benign prostatic hyperplasia (BPH)  certain medicines for blood pressure  certain medicines for fungal infections like ketoconazole and itraconazole  cimetidine  erythromycin  rifampin This list may not describe all possible interactions. Give your health care provider a list of all the medicines, herbs, non-prescription drugs, or dietary supplements you use. Also tell them if you smoke, drink alcohol, or use illegal drugs. Some items may interact with your medicine. What should I watch for while using this medicine? If you notice any changes in your vision while taking this drug, call your doctor or health care professional as soon as possible. Stop using this medicine and call your health care provider right away if you have a loss of sight in one or both eyes. Contact your doctor or health care professional right away if you have an erection that lasts longer than 4 hours or if it becomes painful. This may be a sign of a serious problem and must be treated right away to prevent permanent damage. If you experience symptoms of nausea, dizziness, chest pain or arm pain upon initiation of sexual activity after taking this medicine, you should refrain from further activity and call your doctor or health care professional as soon as possible. Do not drink alcohol to excess (examples, 5 glasses of wine or 5 shots of whiskey) when taking this medicine. When taken in excess, alcohol can increase your chances of getting a headache or getting dizzy, increasing   your heart rate or lowering your blood pressure. Using this medicine does not protect you or your partner against HIV infection (the virus that causes AIDS) or other sexually transmitted diseases. What side effects may I notice from receiving this  medicine? Side effects that you should report to your doctor or health care professional as soon as possible:  allergic reactions like skin rash, itching or hives, swelling of the face, lips, or tongue  breathing problems  changes in hearing  changes in vision  chest pain  fast, irregular heartbeat  prolonged or painful erection  seizures Side effects that usually do not require medical attention (report to your doctor or health care professional if they continue or are bothersome):  back pain  dizziness  flushing  headache  indigestion  muscle aches  nausea  stuffy or runny nose This list may not describe all possible side effects. Call your doctor for medical advice about side effects. You may report side effects to FDA at 1-800-FDA-1088. Where should I keep my medicine? Keep out of reach of children. Store at room temperature between 15 and 30 degrees C (59 and 86 degrees F). Throw away any unused medicine after the expiration date. NOTE: This sheet is a summary. It may not cover all possible information. If you have questions about this medicine, talk to your doctor, pharmacist, or health care provider.  2020 Elsevier/Gold Standard (2015-10-27 12:00:25)  

## 2020-06-01 ENCOUNTER — Encounter: Payer: Self-pay | Admitting: Family Medicine

## 2020-06-01 DIAGNOSIS — S31000A Unspecified open wound of lower back and pelvis without penetration into retroperitoneum, initial encounter: Secondary | ICD-10-CM | POA: Insufficient documentation

## 2020-06-01 DIAGNOSIS — N529 Male erectile dysfunction, unspecified: Secondary | ICD-10-CM | POA: Insufficient documentation

## 2020-06-10 MED FILL — SILDENAFIL CITRATE 100 MG T: 100 | 30 days supply | Qty: 5 | Fill #0

## 2020-06-14 ENCOUNTER — Other Ambulatory Visit: Payer: Self-pay

## 2020-06-15 MED ORDER — BISACODYL 10 MG RE SUPP: 10.0000 mg | Freq: Every day | RECTAL | 0 refills | Status: DC

## 2020-06-15 MED ORDER — DOCUSATE SODIUM 100 MG PO CAPS: 100.0000 mg | ORAL_CAPSULE | Freq: Every day | ORAL | 0 refills | Status: DC

## 2020-06-25 ENCOUNTER — Encounter: Payer: Medicaid Other | Admitting: Physical Medicine and Rehabilitation

## 2020-06-28 ENCOUNTER — Ambulatory Visit: Payer: Self-pay | Admitting: Family Medicine

## 2020-07-01 ENCOUNTER — Telehealth: Payer: Self-pay

## 2020-07-01 NOTE — Telephone Encounter (Signed)
-----   Message from Kallie Locks, FNP sent at 06/19/2020  1:15 PM EDT ----- Regarding: "Home Health Services" Please contact patient to assess if he has received Home Health services and needed supplies. Thanks so much Buell Parcel!

## 2020-07-02 ENCOUNTER — Other Ambulatory Visit: Payer: Self-pay | Admitting: Family Medicine

## 2020-07-06 ENCOUNTER — Encounter: Payer: Self-pay | Admitting: Family Medicine

## 2020-07-06 ENCOUNTER — Ambulatory Visit (INDEPENDENT_AMBULATORY_CARE_PROVIDER_SITE_OTHER): Payer: Self-pay | Admitting: Family Medicine

## 2020-07-06 ENCOUNTER — Other Ambulatory Visit: Payer: Self-pay

## 2020-07-06 VITALS — BP 121/78 | HR 100 | Temp 97.9°F | Resp 18

## 2020-07-06 DIAGNOSIS — W3400XD Accidental discharge from unspecified firearms or gun, subsequent encounter: Secondary | ICD-10-CM

## 2020-07-06 DIAGNOSIS — Z09 Encounter for follow-up examination after completed treatment for conditions other than malignant neoplasm: Secondary | ICD-10-CM

## 2020-07-06 DIAGNOSIS — K592 Neurogenic bowel, not elsewhere classified: Secondary | ICD-10-CM

## 2020-07-06 DIAGNOSIS — W3400XA Accidental discharge from unspecified firearms or gun, initial encounter: Secondary | ICD-10-CM

## 2020-07-06 DIAGNOSIS — N529 Male erectile dysfunction, unspecified: Secondary | ICD-10-CM

## 2020-07-06 DIAGNOSIS — S24109D Unspecified injury at unspecified level of thoracic spinal cord, subsequent encounter: Secondary | ICD-10-CM

## 2020-07-06 DIAGNOSIS — G8220 Paraplegia, unspecified: Secondary | ICD-10-CM

## 2020-07-06 DIAGNOSIS — Y249XXA Unspecified firearm discharge, undetermined intent, initial encounter: Secondary | ICD-10-CM
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
## 2020-07-08 ENCOUNTER — Encounter: Payer: Self-pay | Admitting: Family Medicine

## 2020-07-09 ENCOUNTER — Other Ambulatory Visit: Payer: Self-pay

## 2020-07-09 MED ORDER — TRAMADOL HCL 50 MG PO TABS: 100.0000 mg | ORAL_TABLET | Freq: Four times a day (QID) | ORAL | 1 refills | Status: DC

## 2020-07-09 MED ORDER — BACLOFEN 20 MG PO TABS: 20.0000 mg | ORAL_TABLET | Freq: Three times a day (TID) | ORAL | 5 refills | Status: DC

## 2020-07-09 NOTE — Telephone Encounter (Signed)
Need to know which pharmacy to send to- he has 2 pharmacies he's used- 1 for each medicine- baclofen and tramadol.  I can send, but need ot know where.

## 2020-07-09 NOTE — Telephone Encounter (Signed)
Patient calling requesting refill on Baclofen and Tramadol. Next appt 07/12/2020

## 2020-07-12 ENCOUNTER — Other Ambulatory Visit: Payer: Self-pay

## 2020-07-12 ENCOUNTER — Other Ambulatory Visit: Payer: Self-pay | Admitting: Physical Medicine and Rehabilitation

## 2020-07-12 ENCOUNTER — Ambulatory Visit (HOSPITAL_COMMUNITY)
Admission: RE | Admit: 2020-07-12 | Discharge: 2020-07-12 | Disposition: A | Discharge status: Home / Self Care | Payer: Medicaid Other | Source: Ambulatory Visit | Attending: Physical Medicine and Rehabilitation | Admitting: Physical Medicine and Rehabilitation

## 2020-07-12 ENCOUNTER — Encounter
Payer: Medicaid Other | Attending: Physical Medicine and Rehabilitation | Admitting: Physical Medicine and Rehabilitation

## 2020-07-12 ENCOUNTER — Encounter: Payer: Self-pay | Admitting: Physical Medicine and Rehabilitation

## 2020-07-12 VITALS — BP 116/74 | HR 113 | Temp 98.4°F | Ht 71.0 in | Wt 130.0 lb

## 2020-07-12 DIAGNOSIS — M898X9 Other specified disorders of bone, unspecified site: Secondary | ICD-10-CM | POA: Insufficient documentation

## 2020-07-12 DIAGNOSIS — R29818 Other symptoms and signs involving the nervous system: Secondary | ICD-10-CM | POA: Insufficient documentation

## 2020-07-12 DIAGNOSIS — Z79891 Long term (current) use of opiate analgesic: Secondary | ICD-10-CM | POA: Diagnosis not present

## 2020-07-12 DIAGNOSIS — L89154 Pressure ulcer of sacral region, stage 4: Secondary | ICD-10-CM | POA: Diagnosis present

## 2020-07-12 DIAGNOSIS — K592 Neurogenic bowel, not elsewhere classified: Secondary | ICD-10-CM | POA: Insufficient documentation

## 2020-07-12 DIAGNOSIS — N319 Neuromuscular dysfunction of bladder, unspecified: Secondary | ICD-10-CM | POA: Diagnosis present

## 2020-07-12 DIAGNOSIS — W3400XA Accidental discharge from unspecified firearms or gun, initial encounter: Secondary | ICD-10-CM

## 2020-07-12 DIAGNOSIS — R252 Cramp and spasm: Secondary | ICD-10-CM | POA: Diagnosis present

## 2020-07-12 DIAGNOSIS — G894 Chronic pain syndrome: Secondary | ICD-10-CM | POA: Diagnosis not present

## 2020-07-12 DIAGNOSIS — G8929 Other chronic pain: Secondary | ICD-10-CM | POA: Insufficient documentation

## 2020-07-12 DIAGNOSIS — Z5181 Encounter for therapeutic drug level monitoring: Secondary | ICD-10-CM | POA: Diagnosis not present

## 2020-07-12 DIAGNOSIS — G8220 Paraplegia, unspecified: Secondary | ICD-10-CM | POA: Insufficient documentation

## 2020-07-12 DIAGNOSIS — L89151 Pressure ulcer of sacral region, stage 1: Secondary | ICD-10-CM

## 2020-07-12 NOTE — Patient Instructions (Signed)
1. Wound care referral. ASAP  2. Don't go to ER unless feverish, t-101.   3. Sent in Tramadol as of 07/09/20- As well as Baclofen.   4. 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   5. F/U in 1 month- double appointment.
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
## 2020-07-14 ENCOUNTER — Other Ambulatory Visit: Payer: Self-pay | Admitting: *Deleted

## 2020-07-14 ENCOUNTER — Telehealth: Payer: Self-pay | Admitting: Physical Medicine and Rehabilitation

## 2020-07-14 ENCOUNTER — Institutional Professional Consult (permissible substitution): Payer: Medicaid Other | Admitting: Plastic Surgery

## 2020-07-14 NOTE — Telephone Encounter (Signed)
Error

## 2020-07-14 NOTE — Telephone Encounter (Signed)
Can someone call patient about a prior auth for his medication?  Thank you.

## 2020-07-14 NOTE — Telephone Encounter (Signed)
Thurston Hole has called and talked with him and his pharmacy.  Prior Auth Submitted via CoverMyMeds to [employer] for tramadol 50 mg #[id number]. Await insurance reply.

## 2020-07-14 NOTE — Telephone Encounter (Addendum)
Prior authorization for tramadol approved.  07/14/2021 - 01/10/2021  Pharmacy notified  Patient notified

## 2020-07-15 LAB — DRUG TOX MONITOR 1 W/CONF, ORAL FLD
Amphetamines: NEGATIVE ng/mL (ref <10)
Barbiturates: NEGATIVE ng/mL (ref <10)
Benzodiazepines: NEGATIVE ng/mL (ref <0.50)
Buprenorphine: NEGATIVE ng/mL (ref <0.10)
Cocaine: NEGATIVE ng/mL (ref <5.0)
Codeine: NEGATIVE ng/mL (ref <2.5)
Cotinine: >250 ng/mL — ABNORMAL HIGH (ref <5.0)
Dihydrocodeine: NEGATIVE ng/mL (ref <2.5)
Fentanyl: NEGATIVE ng/mL (ref <0.10)
Heroin Metabolite: NEGATIVE ng/mL (ref <1.0)
Hydrocodone: NEGATIVE ng/mL (ref <2.5)
Hydromorphone: NEGATIVE ng/mL (ref <2.5)
MARIJUANA: POSITIVE ng/mL — AB (ref <2.5)
MDMA: NEGATIVE ng/mL (ref <10)
Meprobamate: NEGATIVE ng/mL (ref <2.5)
Methadone: NEGATIVE ng/mL (ref <5.0)
Morphine: NEGATIVE ng/mL (ref <2.5)
Nicotine Metabolite: POSITIVE ng/mL — AB (ref <5.0)
Norhydrocodone: NEGATIVE ng/mL (ref <2.5)
Noroxycodone: 63 ng/mL — ABNORMAL HIGH (ref <2.5)
Opiates: POSITIVE ng/mL — AB (ref <2.5)
Oxycodone: 193.2 ng/mL — ABNORMAL HIGH (ref <2.5)
Oxymorphone: NEGATIVE ng/mL (ref <2.5)
Phencyclidine: NEGATIVE ng/mL (ref <10)
THC: 3.6 ng/mL — ABNORMAL HIGH (ref <2.5)
Tapentadol: NEGATIVE ng/mL (ref <5.0)
Tramadol: 18.9 ng/mL — ABNORMAL HIGH (ref <5.0)
Tramadol: POSITIVE ng/mL — AB (ref <5.0)
Zolpidem: NEGATIVE ng/mL (ref <5.0)

## 2020-07-15 LAB — DRUG TOX ALC METAB W/CON, ORAL FLD: Alcohol Metabolite: NEGATIVE ng/mL (ref <25)

## 2020-07-21 ENCOUNTER — Encounter: Payer: Self-pay | Admitting: Plastic Surgery

## 2020-07-21 ENCOUNTER — Telehealth: Payer: Self-pay | Admitting: Physical Medicine and Rehabilitation

## 2020-07-21 ENCOUNTER — Other Ambulatory Visit: Payer: Self-pay

## 2020-07-21 ENCOUNTER — Telehealth: Payer: Self-pay | Admitting: *Deleted

## 2020-07-21 ENCOUNTER — Ambulatory Visit (INDEPENDENT_AMBULATORY_CARE_PROVIDER_SITE_OTHER): Payer: Medicaid Other | Admitting: Plastic Surgery

## 2020-07-21 VITALS — BP 113/71 | HR 129 | Temp 98.6°F | Ht 71.0 in | Wt 130.0 lb

## 2020-07-21 DIAGNOSIS — L89154 Pressure ulcer of sacral region, stage 4: Secondary | ICD-10-CM | POA: Diagnosis not present

## 2020-07-21 MED ORDER — INDOMETHACIN 25 MG PO CAPS: 25.0000 mg | ORAL_CAPSULE | Freq: Three times a day (TID) | ORAL | 3 refills | Status: DC

## 2020-07-21 NOTE — Telephone Encounter (Addendum)
Oral swab drug screen was consistent for prescribed medications.  However, this is the second positive for marijuana. He was mailed a letter in June and sent a myChart letter as well with a formal warning about the use of marijuana.

## 2020-07-21 NOTE — Progress Notes (Signed)
113/71 
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
## 2020-07-21 NOTE — Telephone Encounter (Signed)
Called pt to discuss has a lot of bone formation in the L hip- called heterotopic ossification- or HO- will try Indomethicin - the dose is usually 75 mg SR daily- but not on pt's formulary- will try 25 mg TID with meals based on that dose- gave 3 RFs Pt voiced understanding.

## 2020-07-26 ENCOUNTER — Other Ambulatory Visit: Payer: Self-pay

## 2020-07-26 MED ORDER — OXYCODONE HCL 10 MG PO TABS: 15.0000 mg | ORAL_TABLET | Freq: Four times a day (QID) | ORAL | 0 refills | Status: DC | PRN

## 2020-07-28 ENCOUNTER — Telehealth: Payer: Self-pay | Admitting: *Deleted

## 2020-07-28 NOTE — Telephone Encounter (Signed)
Prior authorization for oxycodone HCL 10 mg Approved.  07/27/2021 - 01/23/2021

## 2020-07-30 ENCOUNTER — Encounter (HOSPITAL_BASED_OUTPATIENT_CLINIC_OR_DEPARTMENT_OTHER): Payer: Medicaid Other | Attending: Internal Medicine | Admitting: Internal Medicine

## 2020-07-30 DIAGNOSIS — L89520 Pressure ulcer of left ankle, unstageable: Secondary | ICD-10-CM | POA: Diagnosis not present

## 2020-07-30 DIAGNOSIS — Z87891 Personal history of nicotine dependence: Secondary | ICD-10-CM | POA: Diagnosis not present

## 2020-07-30 DIAGNOSIS — G8221 Paraplegia, complete: Secondary | ICD-10-CM | POA: Diagnosis not present

## 2020-07-30 DIAGNOSIS — L89612 Pressure ulcer of right heel, stage 2: Secondary | ICD-10-CM | POA: Diagnosis not present

## 2020-07-30 DIAGNOSIS — L89154 Pressure ulcer of sacral region, stage 4: Secondary | ICD-10-CM | POA: Diagnosis present

## 2020-08-04 NOTE — Progress Notes (Signed)
[name], [name] ([id number]) Visit Report for 07/30/2020 Abuse/Suicide Risk Screen Details Patient Name: Date of Service: [name], [name] 07/30/2020 9:00 A M Medical Record Number: [id number] Patient Account Number: [id number] Date of Birth/Sex: Treating RN: 05-26-1991 (29 y.o. Male) Yevonne Pax Primary Care Carmino Ocain: [name] Other Clinician: Referring Naja Apperson: Treating Thong Feeny/Extender: Dorian Heckle in Treatment: 0 Abuse/Suicide Risk Screen Items Answer ABUSE RISK SCREEN: Has anyone close to you tried to hurt or harm you recentlyo No Do you feel uncomfortable with anyone in your familyo No Has anyone forced you do things that you didnt want to doo No Electronic Signature(s) Signed: 08/04/2020 5:14:09 PM By: Yevonne Pax RN Entered By: Yevonne Pax on 07/30/2020 09:37:02 -------------------------------------------------------------------------------- Activities of Daily Living Details Patient Name: Date of Service: [name], [name] NIO L. 07/30/2020 9:00 A M Medical Record Number: [id number] Patient Account Number: [id number] Date of Birth/Sex: Treating RN: 1991/01/03 (29 y.o. Male) Yevonne Pax Primary Care Jestina Stephani: [name] Other Clinician: Referring Hayleen Clinkscales: Treating Arthur Speagle/Extender: Dorian Heckle in Treatment: 0 Activities of Daily Living Items Answer Activities of Daily Living (Please select one for each item) Drive Automobile Completely Able T Medications ake Completely Able Use T elephone Completely Able Care for Appearance Need Assistance Use T oilet Need Assistance Bath / Shower Need Assistance Dress Self Need Assistance Feed Self Completely Able Walk Not Able Get In / Out Bed Completely Able Housework Need Assistance Prepare Meals Need Assistance Handle Money Completely Able Shop for Self Need Assistance Electronic Signature(s) Signed: 08/04/2020 5:14:09 PM By: Yevonne Pax RN Entered By:  Yevonne Pax on 07/30/2020 09:38:13 -------------------------------------------------------------------------------- Education Screening Details Patient Name: Date of Service: [name] March L. 07/30/2020 9:00 A M Medical Record Number: [id number] Patient Account Number: [id number] Date of Birth/Sex: Treating RN: 1990/12/19 (29 y.o. Male) Yevonne Pax Primary Care Krishna Dancel: [name] Other Clinician: Referring Cassandra Mcmanaman: Treating Seerat Peaden/Extender: Dorian Heckle in Treatment: 0 Primary Learner Assessed: Patient Learning Preferences/Education Level/Primary Language Learning Preference: Explanation Highest Education Level: High School Preferred Language: English Cognitive Barrier Language Barrier: No Translator Needed: No Memory Deficit: No Emotional Barrier: No Cultural/Religious Beliefs Affecting Medical Care: No Physical Barrier Impaired Vision: No Impaired Hearing: No Decreased Hand dexterity: No Knowledge/Comprehension Knowledge Level: Medium Comprehension Level: High Ability to understand written instructions: High Ability to understand verbal instructions: High Motivation Anxiety Level: Anxious Cooperation: Cooperative Education Importance: Acknowledges Need Interest in Health Problems: Asks Questions Perception: Coherent Willingness to Engage in Self-Management High Activities: Readiness to Engage in Self-Management High Activities: Electronic Signature(s) Signed: 08/04/2020 5:14:09 PM By: Yevonne Pax RN Entered By: Yevonne Pax on 07/30/2020 09:39:20 -------------------------------------------------------------------------------- Fall Risk Assessment Details Patient Name: Date of Service: [name] March L. 07/30/2020 9:00 A M Medical Record Number: [id number] Patient Account Number: [id number] Date of Birth/Sex: Treating RN: Oct 12, 1991 (29 y.o. Male) Yevonne Pax Primary Care Chavie Kolinski: [name] Other  Clinician: Referring Tonyia Marschall: Treating Dante Roudebush/Extender: Dorian Heckle in Treatment: 0 Fall Risk Assessment Items Have you had 2 or more falls in the last 12 monthso 0 No Have you had any fall that resulted in injury in the last 12 monthso 0 No FALLS RISK SCREEN History of falling - immediate or within 3 months 0 No Secondary diagnosis (Do you have 2 or more medical diagnoseso) 0 No Ambulatory aid None/bed rest/wheelchair/nurse 0 No Crutches/cane/walker 0 No Furniture 0 No Intravenous therapy Access/Saline/Heparin Lock 0 No Gait/Transferring Normal/ bed rest/ wheelchair 0 No Weak (short  steps with or without shuffle, stooped but able to lift head while walking, may seek 0 No support from furniture) Impaired (short steps with shuffle, may have difficulty arising from chair, head down, impaired 0 No balance) Mental Status Oriented to own ability 0 No Electronic Signature(s) Signed: 08/04/2020 5:14:09 PM By: Yevonne Pax RN Entered By: Yevonne Pax on 07/30/2020 09:39:30 -------------------------------------------------------------------------------- Foot Assessment Details Patient Name: Date of Service: [name] March L. 07/30/2020 9:00 A M Medical Record Number: [id number] Patient Account Number: [id number] Date of Birth/Sex: Treating RN: 1991/03/16 (29 y.o. Male) Yevonne Pax Primary Care Eiman Maret: [name] Other Clinician: Referring Vastie Douty: Treating Aidenn Skellenger/Extender: Dorian Heckle in Treatment: 0 Foot Assessment Items Site Locations + = Sensation present, - = Sensation absent, C = Callus, U = Ulcer R = Redness, W = Warmth, M = Maceration, PU = Pre-ulcerative lesion F = Fissure, S = Swelling, D = Dryness Assessment Right: Left: Other Deformity: No No Prior Foot Ulcer: No No Prior Amputation: No No Charcot Joint: No No Ambulatory Status: Gait: Electronic Signature(s) Signed: 08/04/2020 5:14:09 PM By: Yevonne Pax RN Entered By: Yevonne Pax on 07/30/2020 09:45:39 -------------------------------------------------------------------------------- Nutrition Risk Screening Details Patient Name: Date of Service: [name], [name] NIO L. 07/30/2020 9:00 A M Medical Record Number: [id number] Patient Account Number: [id number] Date of Birth/Sex: Treating RN: 1991/07/28 (29 y.o. Male) Yevonne Pax Primary Care Tannie Koskela: [name] Other Clinician: Referring Keily Lepp: Treating Norrine Ballester/Extender: Dorian Heckle in Treatment: 0 Height (in): 71 Weight (lbs): 136 Body Mass Index (BMI): 19 Nutrition Risk Screening Items Score Screening NUTRITION RISK SCREEN: I have an illness or condition that made me change the kind and/or amount of food I eat 0 No I eat fewer than two meals per day 0 No I eat few fruits and vegetables, or milk products 0 No I have three or more drinks of beer, liquor or wine almost every day 0 No I have tooth or mouth problems that make it hard for me to eat 0 No I don't always have enough money to buy the food I need 0 No I eat alone most of the time 0 No I take three or more different prescribed or over-the-counter drugs a day 1 Yes Without wanting to, I have lost or gained 10 pounds in the last six months 2 Yes I am not always physically able to shop, cook and/or feed myself 0 No Nutrition Protocols Good Risk Protocol Provide education on elevated blood Moderate Risk Protocol 0 sugars and impact on wound healing, as applicable High Risk Proctocol Risk Level: Moderate Risk Score: 3 Electronic Signature(s) Signed: 08/04/2020 5:14:09 PM By: Yevonne Pax RN Entered By: Yevonne Pax on 07/30/2020 09:40:00

## 2020-08-04 NOTE — Progress Notes (Signed)
[name], [name] ([id number]) Visit Report for 07/30/2020 Allergy List Details Patient Name: Date of Service: [name] 07/30/2020 9:00 [name] M Medical Record Number: [id number] Patient Account Number: [id number] Date of Birth/Sex: Treating RN: 29-Nov-1990 (29 y.o. Male) Yevonne Pax Primary Care Torrin Crihfield: [name] Other Clinician: Referring Voshon Petro: Treating Terena Bohan/Extender: Markus Jarvis Weeks in Treatment: 0 Allergies Active Allergies gabapentin Allergy Notes Electronic Signature(s) Signed: 08/04/2020 5:14:09 PM By: Yevonne Pax RN Entered By: Yevonne Pax on 07/30/2020 70:62:37 -------------------------------------------------------------------------------- Arrival Information Details Patient Name: Date of Service: [name] March L. 07/30/2020 9:00 [name] M Medical Record Number: [id number] Patient Account Number: [id number] Date of Birth/Sex: Treating RN: 1991-10-06 (29 y.o. Male) Yevonne Pax Primary Care Genisis Sonnier: [name] Other Clinician: Referring Amadi Yoshino: Treating Twan Harkin/Extender: Dorian Heckle in Treatment: 0 Visit Information Patient Arrived: Wheel Chair Arrival Time: 09:24 Accompanied By: self Transfer Assistance: None Patient Identification Verified: Yes Secondary Verification Process Completed: Yes Patient Requires Transmission-Based Precautions: No Patient Has Alerts: No Electronic Signature(s) Signed: 08/04/2020 5:14:09 PM By: Yevonne Pax RN Entered By: Yevonne Pax on 07/30/2020 09:26:51 -------------------------------------------------------------------------------- Clinic Level of Care Assessment Details Patient Name: Date of Service: [name], [name] L. 07/30/2020 9:00 [name] M Medical Record Number: [id number] Patient Account Number: [id number] Date of Birth/Sex: Treating RN: 11-Feb-1991 (29 y.o. Male) Cherylin Mylar Primary Care Georgiann Neider: [name] Other Clinician: Referring  Kirsten Mckone: Treating Karima Carrell/Extender: Dorian Heckle in Treatment: 0 Clinic Level of Care Assessment Items TOOL 1 Quantity Score X- 1 0 Use when EandM and Procedure is performed on INITIAL visit ASSESSMENTS - Nursing Assessment / Reassessment X- 1 20 General Physical Exam (combine w/ comprehensive assessment (listed just below) when performed on new pt. evals) X- 1 25 Comprehensive Assessment (HX, ROS, Risk Assessments, Wounds Hx, etc.) ASSESSMENTS - Wound and Skin Assessment / Reassessment []  - 0 Dermatologic / Skin Assessment (not related to wound area) ASSESSMENTS - Ostomy and/or Continence Assessment and Care []  - 0 Incontinence Assessment and Management []  - 0 Ostomy Care Assessment and Management (repouching, etc.) PROCESS - Coordination of Care []  - 0 Simple Patient / Family Education for ongoing care X- 1 20 Complex (extensive) Patient / Family Education for ongoing care X- 1 10 Staff obtains , Records, T Results / Process Orders est []  - 0 Staff telephones HHA, Nursing Homes / Clarify orders / etc []  - 0 Routine Transfer to another Facility (non-emergent condition) []  - 0 Routine Hospital Admission (non-emergent condition) X- 1 15 New Admissions / / Ordering NPWT Apligraf, etc. , []  - 0 Emergency Hospital Admission (emergent condition) PROCESS - Special Needs []  - 0 Pediatric / Minor Patient Management []  - 0 Isolation Patient Management []  - 0 Hearing / Language / Visual special needs []  - 0 Assessment of Community assistance (transportation, D/C planning, etc.) []  - 0 Additional assistance / Altered mentation []  - 0 Support Surface(s) Assessment (bed, cushion, seat, etc.) INTERVENTIONS - Miscellaneous []  - 0 External ear exam []  - 0 Patient Transfer (multiple staff / / Similar devices) []  - 0 Simple Staple / Suture removal (25 or less) []  - 0 Complex Staple / Suture removal (26  or more) []  - 0 Hypo/Hyperglycemic Management (do not check if billed separately) []  - 0 Ankle / Brachial Index (ABI) - do not check if billed separately Has the patient been seen at the hospital within the last three years: Yes Total Score: 90 Level Of Care: New/Established -  Level 3 Electronic Signature(s) Signed: 07/30/2020 3:13:35 PM By: Cherylin Mylar Entered By: Cherylin Mylar on 07/30/2020 10:56:31 -------------------------------------------------------------------------------- Encounter Discharge Information Details Patient Name: Date of Service: [name] March L. 07/30/2020 9:00 [name] M Medical Record Number: [id number] Patient Account Number: [id number] Date of Birth/Sex: Treating RN: 1991/08/17 (29 y.o. Male) [name] Primary Care Semir Brill: [name] Other Clinician: Referring Lottie Siska: Treating Kennedy Brines/Extender: Dorian Heckle in Treatment: 0 Encounter Discharge Information Items Post Procedure Vitals Discharge Condition: Stable Temperature (F): 98.5 Ambulatory Status: Wheelchair Pulse (bpm): 118 Discharge Destination: Home Respiratory Rate (breaths/min): 18 Transportation: Private Auto Blood Pressure (mmHg): 130/81 Accompanied By: alone Schedule Follow-up Appointment: Yes Clinical Summary of Care: Patient Declined Electronic Signature(s) Signed: 07/30/2020 3:22:52 PM By: [name] RN, BSN Entered By: [name] on 07/30/2020 15:05:51 -------------------------------------------------------------------------------- Lower Extremity Assessment Details Patient Name: Date of Service: [name] March L. 07/30/2020 9:00 [name] M Medical Record Number: [id number] Patient Account Number: [id number] Date of Birth/Sex: Treating RN: 1991-03-05 (29 y.o. Male) Yevonne Pax Primary Care Kallista Pae: [name] Other Clinician: Referring Zharia Conrow: Treating Nefertari Rebman/Extender: Dorian Heckle in Treatment:  0 Edema Assessment Assessed: [name]: No] [Right: No] E[Left: dema] [Right: :] Calf Left: Right: Point of Measurement: 47 cm From Medial Instep 32 cm 27 cm Ankle Left: Right: Point of Measurement: 12 cm From Medial Instep 20 cm 19 cm Vascular Assessment Blood Pressure: Brachial: [Left:130] [Right:130] Ankle: [Left:Dorsalis Pedis: 118 0.91] [Right:Dorsalis Pedis: 100 0.77] Electronic Signature(s) Signed: 08/04/2020 5:14:09 PM By: Yevonne Pax RN Entered By: Yevonne Pax on 07/30/2020 10:13:28 -------------------------------------------------------------------------------- Multi Wound Chart Details Patient Name: Date of Service: [name] March L. 07/30/2020 9:00 [name] M Medical Record Number: [id number] Patient Account Number: [id number] Date of Birth/Sex: Treating RN: 1991-09-12 (29 y.o. Male) Cherylin Mylar Primary Care Kadyn Guild: [name] Other Clinician: Referring Abran Gavigan: Treating Lura Falor/Extender: Dorian Heckle in Treatment: 0 Vital Signs Height(in): 71 Pulse(bpm): 118 Weight(lbs): 136 Blood Pressure(mmHg): 130/81 Body Mass Index(BMI): 19 Temperature(F): 98.5 Respiratory Rate(breaths/min): 18 Photos: [1:No Photos Right Calcaneus] [2:No Photos Right Achilles] [3:No Photos Left, Lateral Malleolus] Wound Location: [1:Gradually Appeared] [2:Gradually Appeared] [3:Gradually Appeared] Wounding Event: [1:Pressure Ulcer] [2:Pressure Ulcer] [3:Pressure Ulcer] Primary Etiology: [1:Paraplegia] [2:Paraplegia] [3:Paraplegia] Comorbid History: [1:06/27/2020] [2:06/27/2020] [3:06/27/2020] Date Acquired: [1:0] [2:0] [3:0] Weeks of Treatment: [1:Open] [2:Open] [3:Open] Wound Status: [1:0.8x2x0.1] [2:0.9x1.5x0.1] [3:2x2.3x0.6] Measurements L x W x D (cm) [1:1.257] [2:1.06] [3:3.613] [name] (cm) : rea [1:0.126] [2:0.106] [3:2.168] Volume (cm) : [1:0.00%] [2:0.00%] [3:0.00%] % Reduction in [name] rea: [1:0.00%] [2:0.00%] [3:0.00%] % Reduction in Volume: [1:No]  [2:No] [3:No] Undermining: [1:Category/Stage III] [2:Unstageable/Unclassified] [3:Category/Stage III] Classification: [1:Medium] [2:Medium] [3:Medium] Exudate [name] mount: [1:Serosanguineous] [2:Serosanguineous] [3:Serosanguineous] Exudate Type: [1:red, brown] [2:red, brown] [3:red, brown] Exudate Color: [1:Distinct, outline attached] [2:Well defined, not attached] [3:Distinct, outline attached] Wound Margin: [1:Medium (34-66%)] [2:None Present (0%)] [3:Medium (34-66%)] Granulation [name] mount: [1:Pink] [2:N/[name]] [3:Pink] Granulation Quality: [1:Medium (34-66%)] [2:Large (67-100%)] [3:Medium (34-66%)] Necrotic [name] mount: [1:Adherent Slough] [2:Eschar, Adherent Slough] [3:Adherent Slough] Necrotic Tissue: [1:Fascia: No] [2:Fascia: No] [3:Fat Layer (Subcutaneous Tissue): Yes] Exposed Structures: [1:Fat Layer (Subcutaneous Tissue): No Tendon: No Muscle: No Joint: No Bone: No None] [2:Fat Layer (Subcutaneous Tissue): No Tendon: No Muscle: No Joint: No Bone: No None] [3:Fascia: No Tendon: No Muscle: No Joint: No Bone: No None] Epithelialization: [1:N/[name]] [2:Debridement - Excisional] [3:Debridement - Excisional] Debridement: Pre-procedure Verification/Time Out N/[name] [2:10:35] [3:10:35] Taken: [1:N/[name]] [2:Other] [3:Other] Pain Control: [1:N/[name]] [2:Subcutaneous, Slough] [3:Subcutaneous, Slough] Tissue Debrided: [1:N/[name]] [2:Skin/Subcutaneous Tissue] [3:Skin/Subcutaneous Tissue] Level: [1:N/[name]] [2:1.35] [  3:4.6] Debridement [name] (sq cm): [1:rea N/[name]] [2:Curette] [3:Curette] Instrument: [1:N/[name]] [2:Minimum] [3:Minimum] Bleeding: [1:N/[name]] [2:Pressure] [3:Pressure] Hemostasis [name] chieved: [1:N/[name]] [2:0] [3:0] Procedural Pain: [1:N/[name]] [2:0] [3:0] Post Procedural Pain: [1:N/[name]] [2:Procedure was tolerated well] [3:Procedure was tolerated well] Debridement Treatment Response: [1:N/[name]] [2:0.9x1.5x0.2] [3:2x2.3x0.6] Post Debridement Measurements L x W x D (cm) [1:N/[name]] [2:0.212] [3:2.168] Post Debridement Volume: (cm) [1:N/[name]]  [2:Unstageable/Unclassified] [3:Category/Stage III] Post Debridement Stage: [1:N/[name]] [2:Debridement] [3:Debridement] Wound Number: 4 N/[name] N/[name] Photos: No Photos N/[name] N/[name] Sacrum N/[name] N/[name] Wound Location: Gradually Appeared N/[name] N/[name] Wounding Event: Pressure Ulcer N/[name] N/[name] Primary Etiology: Paraplegia N/[name] N/[name] Comorbid History: 05/27/2020 N/[name] N/[name] Date Acquired: 0 N/[name] N/[name] Weeks of Treatment: Open N/[name] N/[name] Wound Status: 6x3x1.9 N/[name] N/[name] Measurements L x W x D (cm) 14.137 N/[name] N/[name] [name] (cm) : rea 26.861 N/[name] N/[name] Volume (cm) : N/[name] N/[name] N/[name] % Reduction in [name] rea: N/[name] N/[name] N/[name] % Reduction in Volume: 12 Starting Position 1 (o'clock): 12 Ending Position 1 (o'clock): 1.7 Maximum Distance 1 (cm): Yes N/[name] N/[name] Undermining: Category/Stage IV N/[name] N/[name] Classification: Medium N/[name] N/[name] Exudate [name] mount: Serosanguineous N/[name] N/[name] Exudate Type: red, brown N/[name] N/[name] Exudate Color: N/[name] N/[name] N/[name] Wound Margin: Large (67-100%) N/[name] N/[name] Granulation [name] mount: Pink N/[name] N/[name] Granulation Quality: Small (1-33%) N/[name] N/[name] Necrotic [name] mount: Adherent Slough N/[name] N/[name] Necrotic Tissue: Fat Layer (Subcutaneous Tissue): Yes N/[name] N/[name] Exposed Structures: Fascia: No Tendon: No Muscle: No Joint: No Bone: No None N/[name] N/[name] Epithelialization: N/[name] N/[name] N/[name] Debridement: N/[name] N/[name] N/[name] Pain Control: N/[name] N/[name] N/[name] Tissue Debrided: N/[name] N/[name] N/[name] Level: N/[name] N/[name] N/[name] Debridement [name] (sq cm): rea N/[name] N/[name] N/[name] Instrument: N/[name] N/[name] N/[name] Bleeding: N/[name] N/[name] N/[name] Hemostasis [name] chieved: N/[name] N/[name] N/[name] Procedural Pain: N/[name] N/[name] N/[name] Post Procedural Pain: Debridement Treatment Response: N/[name] N/[name] N/[name] Post Debridement Measurements L x N/[name] N/[name] N/[name] W x D (cm) N/[name] N/[name] N/[name] Post Debridement Volume: (cm) N/[name] N/[name] N/[name] Post Debridement Stage: N/[name] N/[name] N/[name] Procedures Performed: Treatment Notes Electronic Signature(s) Signed: 07/30/2020 3:13:35 PM By: Cherylin Mylar Signed: 08/03/2020 7:41:19 AM By: Baltazar Najjar MD Entered By: Baltazar Najjar on  07/30/2020 11:08:31 -------------------------------------------------------------------------------- Multi-Disciplinary Care Plan Details Patient Name: Date of Service: [name] Crocker [name] NIO L. 07/30/2020 9:00 [name] M Medical Record Number: [id number] Patient Account Number: [id number] Date of Birth/Sex: Treating RN: Sep 16, 1991 (29 y.o. Male) Cherylin Mylar Primary Care Lillith Mcneff: [name] Other Clinician: Referring Travanti Mcmanus: Treating Calen Posch/Extender: Dorian Heckle in Treatment: 0 Active Inactive Orientation to the Wound Care Program Nursing Diagnoses: Knowledge deficit related to the wound healing center program Goals: Patient/caregiver will verbalize understanding of the Wound Healing Center Program Date Initiated: 07/30/2020 Target Resolution Date: 08/27/2020 Goal Status: Active Interventions: Provide education on orientation to the wound center Notes: Pressure Nursing Diagnoses: Knowledge deficit related to causes and risk factors for pressure ulcer development Goals: Patient/caregiver will verbalize risk factors for pressure ulcer development Date Initiated: 07/30/2020 Target Resolution Date: 08/27/2020 Goal Status: Active Interventions: Provide education on pressure ulcers Notes: Wound/Skin Impairment Nursing Diagnoses: Impaired tissue integrity Goals: Ulcer/skin breakdown will have [name] volume reduction of 50% by week 8 Date Initiated: 07/30/2020 Target Resolution Date: 08/27/2020 Goal Status: Active Interventions: Provide education on ulcer and skin care Notes: Electronic Signature(s) Signed: 07/30/2020 3:13:35 PM By: Cherylin Mylar Entered By: Cherylin Mylar on 07/30/2020 10:28:33 -------------------------------------------------------------------------------- Pain Assessment Details Patient Name: Date of Service: [name] March L. 07/30/2020 9:00 [name] M Medical Record Number: [id number] Patient Account Number: [id number] Date of  Birth/Sex: Treating RN: 02/24/1991 (29 y.o. Male) Yevonne PaxEpps, Carrie Primary Care Maclain Cohron: Raliegh IpStroud, [name] Other Clinician: Referring Adhira Jamil: Treating Yasheka Fossett/Extender: [name], Michael [name], [name] Weeks in Treatment: 0 Active Problems Location of Pain Severity and Description of Pain Patient Has Paino No Site Locations Pain Management and Medication Current Pain Management: Electronic Signature(s) Signed: 08/04/2020 5:14:09 PM By: Yevonne PaxEpps, Carrie RN Entered By: Yevonne PaxEpps, Carrie on 07/30/2020 10:02:10 -------------------------------------------------------------------------------- Patient/Caregiver Education Details Patient Name: Date of Service: Lauro RegulusUSSELL, [name] [name] NIO L. 9/3/2021andnbsp9:00 [name] M Medical Record Number: [id number] Patient Account Number: [id number] Date of Birth/Gender: Treating RN: 10/05/1991 (29 y.o. Male) Cherylin Mylarwiggins, Shannon Primary Care Physician: Raliegh IpStroud, [name] Other Clinician: Referring Physician: Treating Physician/Extender: [name], Michael [name], [name] Weeks in Treatment: 0 Education Assessment Education Provided To: Patient Education Topics Provided Pressure: Handouts: Pressure Ulcers: Care and Offloading Methods: Explain/Verbal Responses: State content correctly Welcome T [facility]: o Handouts: Welcome T [facility] o Methods: Explain/Verbal Responses: State content correctly Wound/Skin Impairment: Handouts: Caring for Your Ulcer Methods: Explain/Verbal Responses: State content correctly Electronic Signature(s) Signed: 07/30/2020 3:13:35 PM By: Cherylin Mylarwiggins, Shannon Entered By: Cherylin Mylarwiggins, Shannon on 07/30/2020 10:29:08 -------------------------------------------------------------------------------- Wound Assessment Details Patient Name: Date of Service: [name] MarchUSSELL, [name] [name] NIO L. 07/30/2020 9:00 [name] M Medical Record Number: [id number] Patient Account Number: [id number] Date of Birth/Sex: Treating RN: 10/10/1991 (29 y.o. Male) Yevonne PaxEpps,  Carrie Primary Care Ahava Kissoon: Raliegh IpStroud, [name] Other Clinician: Referring Jerita Wimbush: Treating Raeanna Soberanes/Extender: [name], Michael [name], [name] Weeks in Treatment: 0 Wound Status Wound Number: 1 Primary Etiology: Pressure Ulcer Wound Location: Right Calcaneus Wound Status: Open Wounding Event: Gradually Appeared Comorbid History: Paraplegia Date Acquired: 06/27/2020 Weeks Of Treatment: 0 Clustered Wound: No Photos Photo Uploaded By: Benjaman KindlerJones, Dedrick on 08/04/2020 13:35:14 Wound Measurements Length: (cm) 0.8 % R Width: (cm) 2 % R Depth: (cm) 0.1 Epi Area: (cm) 1.257 Tu Volume: (cm) 0.126 Un eduction in Area: 0% eduction in Volume: 0% thelialization: None nneling: No dermining: No Wound Description Classification: Category/Stage III Fou Wound Margin: Distinct, outline attached Slo Exudate Amount: Medium Exudate Type: Serosanguineous Exudate Color: red, brown l Odor After Cleansing: No ugh/Fibrino Yes Wound Bed Granulation Amount: Medium (34-66%) Exposed Structure Granulation Quality: Pink Fascia Exposed: No Necrotic Amount: Medium (34-66%) Fat Layer (Subcutaneous Tissue) Exposed: No Necrotic Quality: Adherent Slough Tendon Exposed: No Muscle Exposed: No Joint Exposed: No Bone Exposed: No Treatment Notes Wound #1 (Right Calcaneus) 1. Cleanse With Wound Cleanser 3. Primary Dressing Applied Iodoflex 4. Secondary Dressing Dry Gauze Heel Cup 6. Support Layer Psychologist, educationalApplied Kerlix/Coban Electronic Signature(s) Signed: 07/30/2020 3:13:35 PM By: Cherylin Mylarwiggins, Shannon Signed: 08/04/2020 5:14:09 PM By: Yevonne PaxEpps, Carrie RN Entered By: Cherylin Mylarwiggins, Shannon on 07/30/2020 10:30:52 -------------------------------------------------------------------------------- Wound Assessment Details Patient Name: Date of Service: [name], [name] [name] NIO L. 07/30/2020 9:00 [name] M Medical Record Number: [id number] Patient Account Number: [id number] Date of Birth/Sex: Treating RN: 12/09/1990 (29 y.o. Male) Yevonne PaxEpps,  Carrie Primary Care Tammie Ellsworth: Raliegh IpStroud, [name] Other Clinician: Referring Irvin Lizama: Treating Mozel Burdett/Extender: [name], Michael [name], [name] Weeks in Treatment: 0 Wound Status Wound Number: 2 Primary Etiology: Pressure Ulcer Wound Location: Right Achilles Wound Status: Open Wounding Event: Gradually Appeared Comorbid History: Paraplegia Date Acquired: 06/27/2020 Weeks Of Treatment: 0 Clustered Wound: No Photos Photo Uploaded By: Benjaman KindlerJones, Dedrick on 08/04/2020 13:35:44 Wound Measurements Length: (cm) 0.9 Width: (cm) 1.5 Depth: (cm) 0.1 Area: (cm) 1.06 Volume: (cm) 0.106 % Reduction in Area: 0% % Reduction in Volume: 0% Epithelialization: None Tunneling: No Undermining: No Wound Description Classification: Unstageable/Unclassified Wound Margin: Well defined, not attached Exudate Amount: Medium Exudate Type:  Serosanguineous Exudate Color: red, brown Foul Odor After Cleansing: No Slough/Fibrino Yes Wound Bed Granulation Amount: None Present (0%) Exposed Structure Necrotic Amount: Large (67-100%) Fascia Exposed: No Necrotic Quality: Eschar, Adherent Slough Fat Layer (Subcutaneous Tissue) Exposed: No Tendon Exposed: No Muscle Exposed: No Joint Exposed: No Bone Exposed: No Treatment Notes Wound #2 (Right Achilles) 1. Cleanse With Wound Cleanser 3. Primary Dressing Applied Iodoflex 4. Secondary Dressing Dry Gauze Heel Cup 6. Support Layer Psychologist, educational) Signed: 07/30/2020 3:13:35 PM By: Cherylin Mylar Signed: 08/04/2020 5:14:09 PM By: Yevonne Pax RN Entered By: Cherylin Mylar on 07/30/2020 10:31:09 -------------------------------------------------------------------------------- Wound Assessment Details Patient Name: Date of Service: [name] Corner NIO L. 07/30/2020 9:00 [name] M Medical Record Number: [id number] Patient Account Number: [id number] Date of Birth/Sex: Treating RN: January 06, 1991 (29 y.o. Male) Yevonne Pax Primary Care  Mayo Faulk: [name] Other Clinician: Referring Izack Hoogland: Treating [name] Hoel/Extender: Dorian Heckle in Treatment: 0 Wound Status Wound Number: 3 Primary Etiology: Pressure Ulcer Wound Location: Left, Lateral Malleolus Wound Status: Open Wounding Event: Gradually Appeared Comorbid History: Paraplegia Date Acquired: 06/27/2020 Weeks Of Treatment: 0 Clustered Wound: No Photos Photo Uploaded By: Benjaman Kindler on 08/04/2020 13:35:15 Wound Measurements Length: (cm) 2 Width: (cm) 2.3 Depth: (cm) 0.6 Area: (cm) 3.613 Volume: (cm) 2.168 % Reduction in Area: 0% % Reduction in Volume: 0% Epithelialization: None Tunneling: No Undermining: No Wound Description Classification: Unstageable/Unclassified Wound Margin: Distinct, outline attached Exudate Amount: Medium Exudate Type: Serosanguineous Exudate Color: red, brown Foul Odor After Cleansing: No Slough/Fibrino Yes Wound Bed Granulation Amount: Small (1-33%) Exposed Structure Granulation Quality: Pink Fascia Exposed: No Necrotic Amount: Medium (34-66%) Fat Layer (Subcutaneous Tissue) Exposed: Yes Necrotic Quality: Adherent Slough Tendon Exposed: No Muscle Exposed: No Joint Exposed: No Bone Exposed: No Treatment Notes Wound #3 (Left, Lateral Malleolus) 1. Cleanse With Wound Cleanser 3. Primary Dressing Applied Iodoflex 4. Secondary Dressing Dry Gauze Heel Cup 6. Support Layer Psychologist, educational) Signed: 08/03/2020 5:24:19 PM By: Cherylin Mylar Signed: 08/04/2020 5:14:09 PM By: Yevonne Pax RN Previous Signature: 07/30/2020 3:13:35 PM Version By: Cherylin Mylar Entered By: Cherylin Mylar on 08/03/2020 16:10:54 -------------------------------------------------------------------------------- Wound Assessment Details Patient Name: Date of Service: [name] Crocker [name] NIO L. 07/30/2020 9:00 [name] M Medical Record Number: [id number] Patient Account Number:  [id number] Date of Birth/Sex: Treating RN: 07/15/1991 (29 y.o. Male) Yevonne Pax Primary Care Sevilla Murtagh: [name] Other Clinician: Referring Quadre Bristol: Treating Omri Bertran/Extender: Dorian Heckle in Treatment: 0 Wound Status Wound Number: 4 Primary Etiology: Pressure Ulcer Wound Location: Sacrum Wound Status: Open Wounding Event: Gradually Appeared Comorbid History: Paraplegia Date Acquired: 05/27/2020 Weeks Of Treatment: 0 Clustered Wound: No Photos Photo Uploaded By: Benjaman Kindler on 08/04/2020 13:35:45 Wound Measurements Length: (cm) 6 Width: (cm) 3 Depth: (cm) 1.9 Area: (cm) 14.137 Volume: (cm) 26.861 Wound Description Classification: Category/Stage IV Exudate Amount: Medium Exudate Type: Serosanguineous Exudate Color: red, brown Foul Odor After Cleansing: Slough/Fibrino % Reduction in Area: % Reduction in Volume: Epithelialization: None Tunneling: No Undermining: Yes Starting Position (o'clock): 12 Ending Position (o'clock): 12 Maximum Distance: (cm) 1.7 No Yes Wound Bed Granulation Amount: Large (67-100%) Exposed Structure Granulation Quality: Pink Fascia Exposed: No Necrotic Amount: Small (1-33%) Fat Layer (Subcutaneous Tissue) Exposed: Yes Necrotic Quality: Adherent Slough Tendon Exposed: No Muscle Exposed: No Joint Exposed: No Bone Exposed: No Treatment Notes Wound #4 (Sacrum) 1. Cleanse With Wound Cleanser 3. Primary Dressing Applied Other primary dressing (specifiy in notes) 4. Secondary Dressing ABD Pad Dry Gauze 5. Secured With Tape Notes saline  moistened gauze Electronic Signature(s) Signed: 08/04/2020 5:14:09 PM By: Yevonne Pax RN Entered By: Yevonne Pax on 07/30/2020 10:12:22 -------------------------------------------------------------------------------- Vitals Details Patient Name: Date of Service: [name] March L. 07/30/2020 9:00 [name] M Medical Record Number: [id number] Patient Account Number:  [id number] Date of Birth/Sex: Treating RN: [birth year] (29 y.o. Male) Yevonne Pax Primary Care Rissie Sculley: [name] Other Clinician: Referring Keaten Mashek: Treating Nedim Oki/Extender: Dorian Heckle in Treatment: 0 Vital Signs Time Taken: 09:26 Temperature (F): 98.5 Height (in): 71 Pulse (bpm): 118 Source: Stated Respiratory Rate (breaths/min): 18 Weight (lbs): 136 Blood Pressure (mmHg): 130/81 Source: Stated Reference Range: 80 - 120 mg / dl Body Mass Index (BMI): 19 Electronic Signature(s) Signed: 08/04/2020 5:14:09 PM By: Yevonne Pax RN Entered By: Yevonne Pax on 07/30/2020 62:94:76

## 2020-08-04 NOTE — Progress Notes (Signed)
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 (Subcutaneous Tissue) exposed. There is no tunneling noted, however, there is undermining starting at 12:00 and ending at 12:00 with [name] maximum distance of 1.7cm. There is [name] medium amount of serosanguineous drainage  noted. There is large (67-100%) pink granulation within the wound bed. There is [name] small (1-33%) amount of necrotic tissue within the wound bed including Adherent Slough. Assessment Active Problems ICD-10 Pressure ulcer of sacral region, stage 4 Pressure ulcer of right heel, unstageable Pressure ulcer of right heel, stage 2 Pressure ulcer of left ankle, unstageable Unspecified severe protein-calorie malnutrition Paraplegia, complete Procedures Wound #2 Pre-procedure diagnosis of  Wound #2 is [name] Pressure Ulcer located on the Right Achilles . There was [name] Excisional Skin/Subcutaneous Tissue Debridement with [name] total area of 1.35 sq cm performed by Maxwell Caul., MD. With the following instrument(s): Curette to remove Viable and Non-Viable tissue/material. Material removed includes Subcutaneous Tissue and Slough and after achieving pain control using Other (benzocaine, 20%). No specimens were taken. [name] time out was conducted at 10:35, prior to the start of the procedure. [name] Minimum amount of bleeding was controlled with Pressure. The procedure was tolerated well with [name] pain level of 0 throughout and [name] pain level of 0 following the procedure. Post Debridement Measurements: 0.9cm length x 1.5cm width x 0.2cm depth; 0.212cm^3 volume. Post debridement Stage noted as Unstageable/Unclassified. Character of Wound/Ulcer Post Debridement is improved. Post procedure Diagnosis Wound #2: Same as Pre-Procedure Wound #3 Pre-procedure diagnosis of Wound #3 is [name] Pressure Ulcer located on the Left,Lateral Malleolus . There was [name] Excisional Skin/Subcutaneous Tissue Debridement with [name] total area of 4.6 sq cm performed by Maxwell Caul., MD. With the following instrument(s): Curette to remove Viable and Non-Viable tissue/material. Material removed includes Subcutaneous Tissue and Slough and after achieving pain control using Other (benzocaine, 20%). No specimens were taken. [name] time out was conducted at 10:35, prior to the start of the procedure. [name] Minimum amount of bleeding was controlled with Pressure. The procedure was tolerated well with [name] pain level of 0 throughout and [name] pain level of 0 following the procedure. Post Debridement Measurements: 2cm length x 2.3cm width x 0.6cm depth; 2.168cm^3 volume. Post debridement Stage noted as Category/Stage III. Character of Wound/Ulcer Post Debridement is improved. Post procedure Diagnosis Wound #3: Same as Pre-Procedure Plan Follow-up  Appointments: Return Appointment in 1 week. Dressing Change Frequency: Wound #1 Right Calcaneus: Do not change entire dressing for one week. Wound #2 Right Achilles: Do not change entire dressing for one week. Wound #3 Left,Lateral Malleolus: Do not change entire dressing for one week. Wound #4 Sacrum: Change dressing every day. Wound Cleansing: Wound #1 Right Calcaneus: May shower with protection. - cast protector. do not get dressings wet Wound #2 Right Achilles: May shower with protection. - cast protector. do not get dressings wet Wound #3 Left,Lateral Malleolus: May shower with protection. - cast protector. do not get dressings wet Wound #4 Sacrum: May shower and wash wound with soap and water. Primary Wound Dressing: Wound #4 Sacrum: Other: - wet to dry dressings Wound #1 Right Calcaneus: Iodoflex Wound #2 Right Achilles: Iodoflex Wound #3 Left,Lateral Malleolus: Iodoflex Secondary Dressing: Wound #1 Right Calcaneus: Dry Gauze Heel Cup Wound #2 Right Achilles: Dry Gauze Heel Cup Wound #3 Left,Lateral Malleolus: Dry Gauze Heel Cup Wound #4 Sacrum: Dry Gauze ABD pad Negative Presssure Wound Therapy: Wound #4 Sacrum: Wound Vac to wound continuously at 12mm/hg pressure - sacral. will try to get ordered Edema Control: Wound #1 Right Calcaneus: Kerlix and Coban - Bilateral - lightly and only to ankle areas due to patient stating that his legs sweat Wound #2 Right Achilles: Kerlix and Coban - Bilateral - lightly and only to ankle  areas due to patient stating that his legs sweat Wound #3 Left,Lateral Malleolus: Kerlix and Coban - Bilateral - lightly and only to ankle areas due to patient stating that his legs sweat Off-Loading: Wound #4 Sacrum: Low air-loss mattress (Group 2) - will try to get ordered Roho cushion for wheelchair - will try to get it ordered Turn and reposition every 2 hours Home Health: Admit to Home Health for Skilled Nursing Laboratory  ordered were: Comprehensive metabolic panel=CMS, CBC W Auto Differential panel, C Reactive Protein in serum or plasma, Sed rate -method unspecified Radiology ordered were: X-ray, Sacral - X-Ray of Sacral, non-healing wound to sacral (stage 4) 1. We continued with the wet-to-dry dressings at this point on the sacrum. 2. CMP CBC with [name] sedimentation rate and C-reactive protein. 3. We put Iodoflex on the wounds for further debridement on his heel and left lateral ankle. We will put gauze and [name] Coban wrap on this and attempt to leave this on all week. The patient says that he sweats inordinately in his legs which is somewhat unusual for [name] spinal cord injury patient use of the sweating is above the level. In any case hopefully this can remain intact until we see him next week. 4. We will attempt to get [name] wound VAC provided that there is no osteomyelitis in the sacral area. 5. I spent time talking to him about offloading this area the necessity of this. I spent 45 minutes in review of this patient's past medical records, face-to-face evaluation and preparation of this record Electronic Signature(s) Signed: 08/03/2020 7:41:19 AM By: Baltazar Najjar MD Entered By: Baltazar Najjar on 07/30/2020 11:20:49 -------------------------------------------------------------------------------- HxROS Details Patient Name: Date of Service: [name] [place] NIO L. 07/30/2020 9:00 [name] M Medical Record Number: [id number] Patient Account Number: [id number] Date of Birth/Sex: Treating RN: [birth year] (29 y.o. Male) Yevonne Pax Primary Care Provider: [name] Other Clinician: Referring Provider: Treating Provider/Extender: Dorian Heckle in Treatment: 0 Information Obtained From Patient Constitutional Symptoms (General Health) Complaints and Symptoms: Negative for: Fatigue; Fever; Chills; Marked Weight Change Eyes Complaints and Symptoms: Negative for: Dry Eyes; Vision Changes; Glasses /  Contacts Medical History: Negative for: Cataracts; Glaucoma; Optic Neuritis Ear/Nose/Mouth/Throat Complaints and Symptoms: Negative for: Chronic sinus problems or rhinitis Medical History: Negative for: Chronic sinus problems/congestion; Middle ear problems Respiratory Complaints and Symptoms: Negative for: Chronic or frequent coughs; Shortness of Breath Medical History: Negative for: Aspiration; Asthma; Chronic Obstructive Pulmonary Disease (COPD); Pneumothorax; Sleep Apnea; Tuberculosis Cardiovascular Complaints and Symptoms: Negative for: Chest pain Medical History: Negative for: Angina; Arrhythmia; Congestive Heart Failure; Coronary Artery Disease; Deep Vein Thrombosis; Hypertension; Hypotension; Myocardial Infarction; Peripheral Arterial Disease; Peripheral Venous Disease; Phlebitis; Vasculitis Gastrointestinal Complaints and Symptoms: Negative for: Frequent diarrhea; Nausea; Vomiting Medical History: Negative for: Cirrhosis ; Colitis; Crohns; Hepatitis [name]; Hepatitis B; Hepatitis C Endocrine Complaints and Symptoms: Negative for: Heat/cold intolerance Medical History: Negative for: Type I Diabetes; Type II Diabetes Genitourinary Complaints and Symptoms: Negative for: Frequent urination Medical History: Negative for: End Stage Renal Disease Integumentary (Skin) Complaints and Symptoms: Positive for: Wounds Medical History: Negative for: History of Burn Musculoskeletal Complaints and Symptoms: Negative for: Muscle Pain; Muscle Weakness Medical History: Negative for: Gout; Rheumatoid Arthritis; Osteoarthritis; Osteomyelitis Neurologic Complaints and Symptoms: Negative for: Numbness/parasthesias Medical History: Positive for: Paraplegia Negative for: Dementia; Neuropathy; Quadriplegia; Seizure Disorder Psychiatric Complaints and Symptoms: Negative for: Claustrophobia; Suicidal Medical History: Negative for: Anorexia/bulimia; Confinement  Anxiety Hematologic/Lymphatic Medical History: Negative for: Anemia; Hemophilia; Human Immunodeficiency Virus; Lymphedema;  Sickle Cell Disease Immunological Medical History: Negative for: Lupus Erythematosus; Raynauds; Scleroderma Oncologic Medical History: Negative for: Received Chemotherapy; Received Radiation Immunizations Pneumococcal Vaccine: Received Pneumococcal Vaccination: No Implantable Devices None Family and Social History Cancer: No; Diabetes: No; Heart Disease: No; Hereditary Spherocytosis: No; Hypertension: Yes - Mother; Kidney Disease: No; Lung Disease: No; Seizures: No; Stroke: No; Thyroid Problems: No; Tuberculosis: No; Current every day smoker; Marital Status - Single; Alcohol Use: Never; Drug Use: Current History - pot; Caffeine Use: Rarely; Financial Concerns: No; Food, Clothing or Shelter Needs: No; Support System Lacking: No; Transportation Concerns: No Electronic Signature(s) Signed: 08/03/2020 7:41:19 AM By: Baltazar Najjar MD Signed: 08/04/2020 5:14:09 PM By: Yevonne Pax RN Entered By: Yevonne Pax on 07/30/2020 09:36:39 -------------------------------------------------------------------------------- SuperBill Details Patient Name: Date of Service: [name] 07/30/2020 Medical Record Number: [id number] Patient Account Number: [id number] Date of Birth/Sex: Treating RN: 26-Jul-1991 (29 y.o. Male) Cherylin Mylar Primary Care Provider: [name] Other Clinician: Referring Provider: Treating Provider/Extender: Dorian Heckle in Treatment: 0 Diagnosis Coding ICD-10 Codes Code Description [phone] Pressure ulcer of sacral region, stage 4 L89.610 Pressure ulcer of right heel, unstageable L89.612 Pressure ulcer of right heel, stage 2 L89.520 Pressure ulcer of left ankle, unstageable E43 Unspecified severe protein-calorie malnutrition G82.21 Paraplegia, complete Facility Procedures CPT4 Code: [id number] Description:  99213 - WOUND CARE VISIT-LEV 3 EST PT Modifier: 25 Quantity: 1 CPT4 Code: [id number] Description: 11042 - DEB SUBQ TISSUE 20 SQ CM/< ICD-10 Diagnosis Description L89.610 Pressure ulcer of right heel, unstageable L89.520 Pressure ulcer of left ankle, unstageable Modifier: Quantity: 1 Physician Procedures : CPT4 Code Description Modifier [id number] 99204 - WC PHYS LEVEL 4 - NEW PT 25 ICD-10 Diagnosis Description L89.154 Pressure ulcer of sacral region, stage 4 L89.610 Pressure ulcer of right heel, unstageable L89.520 Pressure ulcer of left ankle, unstageable  G82.21 Paraplegia, complete Quantity: 1 : [id number] 11042 - WC PHYS SUBQ TISS 20 SQ CM ICD-10 Diagnosis Description L89.610 Pressure ulcer of right heel, unstageable L89.520 Pressure ulcer of left ankle, unstageable Quantity: 1 Electronic Signature(s) Signed: 07/30/2020 3:13:35 PM By: Cherylin Mylar Signed: 08/03/2020 7:41:19 AM By: Baltazar Najjar MD Entered By: Cherylin Mylar on 07/30/2020 11:43:47

## 2020-08-05 ENCOUNTER — Other Ambulatory Visit: Payer: Self-pay

## 2020-08-05 ENCOUNTER — Ambulatory Visit (HOSPITAL_COMMUNITY)
Admission: RE | Admit: 2020-08-05 | Discharge: 2020-08-05 | Disposition: A | Discharge status: Home / Self Care | Payer: Medicaid Other | Source: Ambulatory Visit | Attending: Internal Medicine | Admitting: Internal Medicine

## 2020-08-05 ENCOUNTER — Other Ambulatory Visit (HOSPITAL_COMMUNITY): Payer: Self-pay | Admitting: Internal Medicine

## 2020-08-05 DIAGNOSIS — T8189XA Other complications of procedures, not elsewhere classified, initial encounter: Secondary | ICD-10-CM | POA: Insufficient documentation

## 2020-08-05 LAB — CBC WITH DIFFERENTIAL/PLATELET
Abs Immature Granulocytes: 0.05 10*3/uL (ref 0.00–0.07)
Basophils Absolute: 0.1 10*3/uL (ref 0.0–0.1)
Basophils Relative: 1 %
Eosinophils Absolute: 0.1 10*3/uL (ref 0.0–0.5)
Eosinophils Relative: 1 %
HCT: 33 % — ABNORMAL LOW (ref 39.0–52.0)
Hemoglobin: 10 g/dL — ABNORMAL LOW (ref 13.0–17.0)
Immature Granulocytes: 1 %
Lymphocytes Relative: 19 %
Lymphs Abs: 1.8 10*3/uL (ref 0.7–4.0)
MCH: 21.7 pg — ABNORMAL LOW (ref 26.0–34.0)
MCHC: 30.3 g/dL (ref 30.0–36.0)
MCV: 71.7 fL — ABNORMAL LOW (ref 80.0–100.0)
Monocytes Absolute: 0.6 10*3/uL (ref 0.1–1.0)
Monocytes Relative: 7 %
Neutro Abs: 6.8 10*3/uL (ref 1.7–7.7)
Neutrophils Relative %: 71 %
Platelets: 709 10*3/uL — ABNORMAL HIGH (ref 150–400)
RBC: 4.6 MIL/uL (ref 4.22–5.81)
RDW: 16 % — ABNORMAL HIGH (ref 11.5–15.5)
WBC: 9.4 10*3/uL (ref 4.0–10.5)
nRBC: 0 % (ref 0.0–0.2)

## 2020-08-05 LAB — COMPREHENSIVE METABOLIC PANEL
ALT: 12 U/L (ref 0–44)
AST: 12 U/L — ABNORMAL LOW (ref 15–41)
Albumin: 2.4 g/dL — ABNORMAL LOW (ref 3.5–5.0)
Alkaline Phosphatase: 121 U/L (ref 38–126)
Anion gap: 8 (ref 5–15)
BUN: 10 mg/dL (ref 6–20)
CO2: 29 mmol/L (ref 22–32)
Calcium: 9 mg/dL (ref 8.9–10.3)
Chloride: 101 mmol/L (ref 98–111)
Creatinine, Ser: 0.79 mg/dL (ref 0.61–1.24)
GFR calc Af Amer: >60 mL/min (ref >60)
GFR calc non Af Amer: >60 mL/min (ref >60)
Glucose, Bld: 95 mg/dL (ref 70–99)
Potassium: 4.1 mmol/L (ref 3.5–5.1)
Sodium: 138 mmol/L (ref 135–145)
Total Bilirubin: 0.3 mg/dL (ref 0.3–1.2)
Total Protein: 8.4 g/dL — ABNORMAL HIGH (ref 6.5–8.1)

## 2020-08-05 LAB — C-REACTIVE PROTEIN: CRP: 13 mg/dL — ABNORMAL HIGH (ref <1.0)

## 2020-08-05 LAB — SEDIMENTATION RATE: Sed Rate: 79 mm/hr — ABNORMAL HIGH (ref 0–16)

## 2020-08-06 ENCOUNTER — Encounter (HOSPITAL_BASED_OUTPATIENT_CLINIC_OR_DEPARTMENT_OTHER): Payer: Medicaid Other | Admitting: Internal Medicine

## 2020-08-06 DIAGNOSIS — L89154 Pressure ulcer of sacral region, stage 4: Secondary | ICD-10-CM | POA: Diagnosis not present

## 2020-08-09 ENCOUNTER — Telehealth: Payer: Self-pay | Admitting: *Deleted

## 2020-08-09 ENCOUNTER — Other Ambulatory Visit: Payer: Self-pay | Admitting: Internal Medicine

## 2020-08-09 ENCOUNTER — Other Ambulatory Visit (HOSPITAL_COMMUNITY): Payer: Self-pay | Admitting: Internal Medicine

## 2020-08-09 DIAGNOSIS — L89154 Pressure ulcer of sacral region, stage 4: Secondary | ICD-10-CM

## 2020-08-09 NOTE — Progress Notes (Signed)
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

## 2020-08-09 NOTE — Progress Notes (Signed)
[name], [name] ([id number]) Visit Report for 08/06/2020 Arrival Information Details Patient Name: Date of Service: [name], [name] 08/06/2020 9:45 A M Medical Record Number: [id number] Patient Account Number: [id number] Date of Birth/Sex: Treating RN: [birth year] (29 y.o. [name] Primary Care Tijuana Scheidegger: [name] Other Clinician: Referring Tyreck Bell: Treating Chelby Salata/Extender: Dorian Heckle in Treatment: 1 Visit Information History Since Last Visit Added or deleted any medications: No Patient Arrived: Wheel Chair Any new allergies or adverse reactions: No Arrival Time: 10:08 Had a fall or experienced change in No Accompanied By: self activities of daily living that may affect Transfer Assistance: None risk of falls: Patient Identification Verified: Yes Signs or symptoms of abuse/neglect since last visito No Secondary Verification Process Completed: Yes Hospitalized since last visit: No Patient Requires Transmission-Based Precautions: No Implantable device outside of the clinic excluding No Patient Has Alerts: No cellular tissue based products placed in the center since last visit: Has Dressing in Place as Prescribed: Yes Pain Present Now: No Electronic Signature(s) Signed: 08/06/2020 11:28:52 AM By: [name] Ito Entered By: [name] Ito on 08/06/2020 10:09:35 -------------------------------------------------------------------------------- Encounter Discharge Information Details Patient Name: Date of Service: [name] March L. 08/06/2020 9:45 A M Medical Record Number: [id number] Patient Account Number: [id number] Date of Birth/Sex: Treating RN: August 15, 1991 (29 y.o. [name] Primary Care Yvonnie Schinke: [name] Other Clinician: Referring [name]: Treating [name]/Extender: Dorian Heckle in Treatment: 1 Encounter Discharge Information Items Post Procedure Vitals Discharge Condition:  Stable Temperature (F): 98.6 Ambulatory Status: Wheelchair Pulse (bpm): 108 Discharge Destination: Home Respiratory Rate (breaths/min): 18 Transportation: Other Blood Pressure (mmHg): 120/80 Accompanied By: self Schedule Follow-up Appointment: Yes Clinical Summary of Care: Electronic Signature(s) Signed: 08/06/2020 4:55:25 PM By: Shawn Stall Entered By: Shawn Stall on 08/06/2020 11:23:44 -------------------------------------------------------------------------------- Multi Wound Chart Details Patient Name: Date of Service: [name] March L. 08/06/2020 9:45 A M Medical Record Number: [id number] Patient Account Number: [id number] Date of Birth/Sex: Treating RN: Feb 10, 1991 (29 y.o. [name] Primary Care Adelheid Hoggard: [name] Other Clinician: Referring Jaimeson Gopal: Treating Kameryn Davern/Extender: Dorian Heckle in Treatment: 1 Vital Signs Height(in): 71 Pulse(bpm): 108 Weight(lbs): 136 Blood Pressure(mmHg): 120/80 Body Mass Index(BMI): 19 Temperature(F): 98.6 Respiratory Rate(breaths/min): 18 Photos: [1:No Photos [name] Calcaneus] [2:No Photos [name] Achilles] [3:No Photos Left, Lateral Malleolus] Wound Location: [1:Gradually Appeared] [2:Gradually Appeared] [3:Gradually Appeared] Wounding Event: [1:Pressure Ulcer] [2:Pressure Ulcer] [3:Pressure Ulcer] Primary Etiology: [1:Paraplegia] [2:Paraplegia] [3:Paraplegia] Comorbid History: [1:06/27/2020] [2:06/27/2020] [3:06/27/2020] Date Acquired: [1:1] [2:1] [3:1] Weeks of Treatment: [1:Healed - Epithelialized] [2:Open] [3:Open] Wound Status: [1:0x0x0] [2:1.8x1.7x0.1] [3:1.7x2.5x1] Measurements L x W x D (cm) [1:0] [2:2.403] [3:3.338] A (cm) : rea [1:0] [2:0.24] [3:3.338] Volume (cm) : [1:100.00%] [2:-126.70%] [3:7.60%] % Reduction in A rea: [1:100.00%] [2:-126.40%] [3:-54.00%] % Reduction in Volume: [1:Category/Stage III] [2:Unstageable/Unclassified] [3:Unstageable/Unclassified] Classification:  [1:None Present] [2:Medium] [3:Medium] Exudate A mount: [1:N/A] [2:Serosanguineous] [3:Serosanguineous] Exudate Type: [1:N/A] [2:red, brown] [3:red, brown] Exudate Color: [1:Distinct, outline attached] [2:Well defined, not attached] [3:Distinct, outline attached] Wound Margin: [1:None Present (0%)] [2:None Present (0%)] [3:Small (1-33%)] Granulation A mount: [1:N/A] [2:N/A] [3:Pink] Granulation Quality: [1:None Present (0%)] [2:Large (67-100%)] [3:Medium (34-66%)] Necrotic A mount: [1:N/A] [2:Eschar, Adherent Slough] [3:Adherent Slough] Necrotic Tissue: [1:Fascia: No] [2:Fascia: No] [3:Fat Layer (Subcutaneous Tissue): Yes] Exposed Structures: [1:Fat Layer (Subcutaneous Tissue): No Tendon: No Muscle: No Joint: No Bone: No None] [2:Fat Layer (Subcutaneous Tissue): No Tendon: No Muscle: No Joint: No Bone: No None] [3:Fascia: No Tendon: No Muscle: No Joint: No Bone: No None] Epithelialization: [  1:N/A] [2:Debridement - Excisional] [3:Debridement - Excisional] Debridement: Pre-procedure Verification/Time Out N/A [2:10:50] [3:10:50] Taken: [1:N/A] [2:Other] [3:Other] Pain Control: [1:N/A] [2:Subcutaneous] [3:Subcutaneous] Tissue Debrided: [1:N/A] [2:Skin/Subcutaneous Tissue] [3:Skin/Subcutaneous Tissue] Level: [1:N/A] [2:3.06] [3:4.25] Debridement A (sq cm): [1:rea N/A] [2:Curette] [3:Curette] Instrument: [1:N/A] [2:Minimum] [3:Minimum] Bleeding: [1:N/A] [2:Pressure] [3:Pressure] Hemostasis A chieved: [1:N/A] [2:0] [3:0] Procedural Pain: [1:N/A] [2:0] [3:0] Post Procedural Pain: [1:N/A] [2:Procedure was tolerated well] [3:Procedure was tolerated well] Debridement Treatment Response: [1:N/A] [2:1.8x1.7x0.1] [3:1.7x2.5x1] Post Debridement Measurements L x W x D (cm) [1:N/A] [2:0.24] [3:3.338] Post Debridement Volume: (cm) [1:N/A] [2:Unstageable/Unclassified] [3:Unstageable/Unclassified] Post Debridement Stage: [1:N/A] [2:Debridement] [3:Debridement] Wound Number: 4 N/A N/A Photos: No Photos  N/A N/A Sacrum N/A N/A Wound Location: Gradually Appeared N/A N/A Wounding Event: Pressure Ulcer N/A N/A Primary Etiology: Paraplegia N/A N/A Comorbid History: 05/27/2020 N/A N/A Date Acquired: 1 N/A N/A Weeks of Treatment: Open N/A N/A Wound Status: 6.3x3.3x1.1 N/A N/A Measurements L x W x D (cm) 16.328 N/A N/A A (cm) : rea 17.961 N/A N/A Volume (cm) : -15.50% N/A N/A % Reduction in A rea: 33.10% N/A N/A % Reduction in Volume: Category/Stage IV N/A N/A Classification: Medium N/A N/A Exudate A mount: Serosanguineous N/A N/A Exudate Type: red, brown N/A N/A Exudate Color: N/A N/A N/A Wound Margin: Large (67-100%) N/A N/A Granulation A mount: Pink N/A N/A Granulation Quality: Small (1-33%) N/A N/A Necrotic A mount: Adherent Slough N/A N/A Necrotic Tissue: Fat Layer (Subcutaneous Tissue): Yes N/A N/A Exposed Structures: Fascia: No Tendon: No Muscle: No Joint: No Bone: No None N/A N/A Epithelialization: N/A N/A N/A Debridement: N/A N/A N/A Pain Control: N/A N/A N/A Tissue Debrided: N/A N/A N/A Level: N/A N/A N/A Debridement A (sq cm): rea N/A N/A N/A Instrument: N/A N/A N/A Bleeding: N/A N/A N/A Hemostasis A chieved: N/A N/A N/A Procedural Pain: N/A N/A N/A Post Procedural Pain: Debridement Treatment Response: N/A N/A N/A Post Debridement Measurements L x N/A N/A N/A W x D (cm) N/A N/A N/A Post Debridement Volume: (cm) N/A N/A N/A Post Debridement Stage: N/A N/A N/A Procedures Performed: Treatment Notes Electronic Signature(s) Signed: 08/06/2020 4:40:23 PM By: Cherylin Mylarwiggins, Shannon Signed: 08/09/2020 7:26:40 PM By: Baltazar Najjarobson, Michael MD Entered By: Baltazar Najjarobson, Michael on 08/06/2020 11:04:00 -------------------------------------------------------------------------------- Multi-Disciplinary Care Plan Details Patient Name: Date of Service: [name] MarchUSSELL, [name] 08/06/2020 9:45 A M Medical Record Number: [id number] Patient Account Number:  [id number] Date of Birth/Sex: Treating RN: 07/02/1991 (29 y.o. [name] RightM) Dwiggins, Shannon Primary Care [name]: Raliegh IpStroud, [name] Other Clinician: Referring [name]: Treating [name]/Extender: [name], Michael [name], [name] Weeks in Treatment: 1 Active Inactive Orientation to the Wound Care Program Nursing Diagnoses: Knowledge deficit related to the wound healing center program Goals: Patient/caregiver will verbalize understanding of the Wound Healing Center Program Date Initiated: 07/30/2020 Target Resolution Date: 08/27/2020 Goal Status: Active Interventions: Provide education on orientation to the wound center Notes: Pressure Nursing Diagnoses: Knowledge deficit related to causes and risk factors for pressure ulcer development Goals: Patient/caregiver will verbalize risk factors for pressure ulcer development Date Initiated: 07/30/2020 Target Resolution Date: 08/27/2020 Goal Status: Active Interventions: Provide education on pressure ulcers Notes: Wound/Skin Impairment Nursing Diagnoses: Impaired tissue integrity Goals: Ulcer/skin breakdown will have a volume reduction of 50% by week 8 Date Initiated: 07/30/2020 Target Resolution Date: 08/27/2020 Goal Status: Active Interventions: Provide education on ulcer and skin care Notes: Electronic Signature(s) Signed: 08/06/2020 4:40:23 PM By: Cherylin Mylarwiggins, Shannon Entered By: Cherylin Mylarwiggins, Shannon on 08/06/2020 09:56:35 -------------------------------------------------------------------------------- Pain Assessment Details Patient Name: Date of Service: [name], [name] 08/06/2020 9:45  A M Medical Record Number: [id number] Patient Account Number: [id number] Date of Birth/Sex: Treating RN: Nov 12, 1991 (29 y.o. [name] Primary Care Breuna Loveall: [name] Other Clinician: Referring Elijiah Mickley: Treating Jalien Weakland/Extender: Dorian Heckle in Treatment: 1 Active Problems Location of Pain Severity and  Description of Pain Patient Has Paino No Site Locations Pain Management and Medication Current Pain Management: Electronic Signature(s) Signed: 08/06/2020 11:28:52 AM By: [name] Ito Signed: 08/06/2020 4:40:23 PM By: Cherylin Mylar Entered By: [name] Ito on 08/06/2020 10:10:30 -------------------------------------------------------------------------------- Patient/Caregiver Education Details Patient Name: Date of Service: [name] 9/10/2021andnbsp9:45 A M Medical Record Number: [id number] Patient Account Number: [id number] Date of Birth/Gender: Treating RN: 09-Mar-1991 (29 y.o. [name] Primary Care Physician: [name] Other Clinician: Referring Physician: Treating Physician/Extender: Dorian Heckle in Treatment: 1 Education Assessment Education Provided To: Patient Education Topics Provided Wound/Skin Impairment: Handouts: Caring for Your Ulcer Methods: Explain/Verbal Responses: State content correctly Electronic Signature(s) Signed: 08/06/2020 4:40:23 PM By: Cherylin Mylar Entered By: Cherylin Mylar on 08/06/2020 09:56:52 -------------------------------------------------------------------------------- Wound Assessment Details Patient Name: Date of Service: [name] March L. 08/06/2020 9:45 A M Medical Record Number: [id number] Patient Account Number: [id number] Date of Birth/Sex: Treating RN: 04-26-1991 (29 y.o. [name] Primary Care Clementine Soulliere: [name] Other Clinician: Referring Madeleyn Schwimmer: Treating Kinisha Soper/Extender: Dorian Heckle in Treatment: 1 Wound Status Wound Number: 1 Primary Etiology: Pressure Ulcer Wound Location: [name] Calcaneus Wound Status: Healed - Epithelialized Wounding Event: Gradually Appeared Comorbid History: Paraplegia Date Acquired: 06/27/2020 Weeks Of Treatment: 1 Clustered Wound: No Wound Measurements Length: (cm) Width:  (cm) Depth: (cm) Area: (cm) Volume: (cm) Wound Description Classification: Category/Stage III Wound Margin: Distinct, outline attached Exudate Amount: None Present Foul Odor After Cleansing: Slough/Fibrino 0 % Reduction in Area: 100% 0 % Reduction in Volume: 100% 0 Epithelialization: None 0 Tunneling: No 0 Undermining: No No No Wound Bed Granulation Amount: None Present (0%) Exposed Structure Necrotic Amount: None Present (0%) Fascia Exposed: No Fat Layer (Subcutaneous Tissue) Exposed: No Tendon Exposed: No Muscle Exposed: No Joint Exposed: No Bone Exposed: No Electronic Signature(s) Signed: 08/06/2020 4:40:23 PM By: Cherylin Mylar Signed: 08/06/2020 4:58:04 PM By: Yevonne Pax RN Entered By: Cherylin Mylar on 08/06/2020 10:49:06 -------------------------------------------------------------------------------- Wound Assessment Details Patient Name: Date of Service: [name] March L. 08/06/2020 9:45 A M Medical Record Number: [id number] Patient Account Number: [id number] Date of Birth/Sex: Treating RN: 09-07-1991 (29 y.o. [name] Primary Care Rosemary Pentecost: [name] Other Clinician: Referring Makaio Mach: Treating Katessa Attridge/Extender: Markus Jarvis Weeks in Treatment: 1 Wound Status Wound Number: 2 Primary Etiology: Pressure Ulcer Wound Location: [name] Achilles Wound Status: Open Wounding Event: Gradually Appeared Comorbid History: Paraplegia Date Acquired: 06/27/2020 Weeks Of Treatment: 1 Clustered Wound: No Wound Measurements Length: (cm) 1.8 Width: (cm) 1.7 Depth: (cm) 0.1 Area: (cm) 2.403 Volume: (cm) 0.24 % Reduction in Area: -126.7% % Reduction in Volume: -126.4% Epithelialization: None Tunneling: No Undermining: No Wound Description Classification: Unstageable/Unclassified Wound Margin: Well defined, not attached Exudate Amount: Medium Exudate Type: Serosanguineous Exudate Color: red, brown Foul Odor After  Cleansing: No Slough/Fibrino Yes Wound Bed Granulation Amount: None Present (0%) Exposed Structure Necrotic Amount: Large (67-100%) Fascia Exposed: No Necrotic Quality: Eschar, Adherent Slough Fat Layer (Subcutaneous Tissue) Exposed: No Tendon Exposed: No Muscle Exposed: No Joint Exposed: No Bone Exposed: No Treatment Notes Wound #2 ([name] Achilles) 1. Cleanse With Wound Cleanser Soap and water 3. Primary Dressing Applied Iodoflex 4. Secondary Dressing Dry Gauze Roll Gauze  Foam Heel Cup 5. Secured With Tape Notes foam applied to anterior portion of ankle. Electronic Signature(s) Signed: 08/06/2020 4:58:04 PM By: Yevonne Pax RN Entered By: Yevonne Pax on 08/06/2020 10:21:34 -------------------------------------------------------------------------------- Wound Assessment Details Patient Name: Date of Service: MCKALE, HAFFEY 08/06/2020 9:45 A M Medical Record Number: [id number] Patient Account Number: [id number] Date of Birth/Sex: Treating RN: 1991/05/17 (29 y.o. [name]) Yevonne Pax Primary Care Nayali Talerico: [name] Other Clinician: Referring Jamera Vanloan: Treating Arshdeep Bolger/Extender: Dorian Heckle in Treatment: 1 Wound Status Wound Number: 3 Primary Etiology: Pressure Ulcer Wound Location: Left, Lateral Malleolus Wound Status: Open Wounding Event: Gradually Appeared Comorbid History: Paraplegia Date Acquired: 06/27/2020 Weeks Of Treatment: 1 Clustered Wound: No Wound Measurements Length: (cm) 1.7 Width: (cm) 2.5 Depth: (cm) 1 Area: (cm) 3.338 Volume: (cm) 3.338 % Reduction in Area: 7.6% % Reduction in Volume: -54% Epithelialization: None Tunneling: No Undermining: No Wound Description Classification: Unstageable/Unclassified Wound Margin: Distinct, outline attached Exudate Amount: Medium Exudate Type: Serosanguineous Exudate Color: red, brown Foul Odor After Cleansing: No Slough/Fibrino Yes Wound Bed Granulation Amount:  Small (1-33%) Exposed Structure Granulation Quality: Pink Fascia Exposed: No Necrotic Amount: Medium (34-66%) Fat Layer (Subcutaneous Tissue) Exposed: Yes Necrotic Quality: Adherent Slough Tendon Exposed: No Muscle Exposed: No Joint Exposed: No Bone Exposed: No Treatment Notes Wound #3 (Left, Lateral Malleolus) 1. Cleanse With Wound Cleanser Soap and water 3. Primary Dressing Applied Iodoflex 4. Secondary Dressing Dry Gauze Roll Gauze Foam Heel Cup 5. Secured With Tape Notes foam applied to anterior portion of ankle. Electronic Signature(s) Signed: 08/06/2020 4:58:04 PM By: Yevonne Pax RN Entered By: Yevonne Pax on 08/06/2020 10:23:06 -------------------------------------------------------------------------------- Wound Assessment Details Patient Name: Date of Service: [name], [name] 08/06/2020 9:45 A M Medical Record Number: [id number] Patient Account Number: [id number] Date of Birth/Sex: Treating RN: [birth year] (29 y.o. [name]) Yevonne Pax Primary Care Tarl Cephas: [name] Other Clinician: Referring Marjon Doxtater: Treating Jaythan Hinely/Extender: Dorian Heckle in Treatment: 1 Wound Status Wound Number: 4 Primary Etiology: Pressure Ulcer Wound Location: Sacrum Wound Status: Open Wounding Event: Gradually Appeared Comorbid History: Paraplegia Date Acquired: 05/27/2020 Weeks Of Treatment: 1 Clustered Wound: No Wound Measurements Length: (cm) 6.3 Width: (cm) 3.3 Depth: (cm) 1.1 Area: (cm) 16.328 Volume: (cm) 17.961 % Reduction in Area: -15.5% % Reduction in Volume: 33.1% Epithelialization: None Tunneling: No Undermining: No Wound Description Classification: Category/Stage IV Exudate Amount: Medium Exudate Type: Serosanguineous Exudate Color: red, brown Foul Odor After Cleansing: No Slough/Fibrino Yes Wound Bed Granulation Amount: Large (67-100%) Exposed Structure Granulation Quality: Pink Fascia Exposed: No Necrotic Amount:  Small (1-33%) Fat Layer (Subcutaneous Tissue) Exposed: Yes Necrotic Quality: Adherent Slough Tendon Exposed: No Muscle Exposed: No Joint Exposed: No Bone Exposed: No Treatment Notes Wound #4 (Sacrum) 1. Cleanse With Wound Cleanser 2. Periwound Care Skin Prep 3. Primary Dressing Applied Other primary dressing (specifiy in notes) 4. Secondary Dressing ABD Pad 5. Secured With Medipore tape Notes saline moistened gauze Electronic Signature(s) Signed: 08/06/2020 4:58:04 PM By: Yevonne Pax RN Entered By: Yevonne Pax on 08/06/2020 10:32:40 -------------------------------------------------------------------------------- Vitals Details Patient Name: Date of Service: [name] March L. 08/06/2020 9:45 A M Medical Record Number: [id number] Patient Account Number: [id number] Date of Birth/Sex: Treating RN: Jan 04, 1991 (29 y.o. [name] Primary Care Kelina Beauchamp: [name] Other Clinician: Referring Chattie Greeson: Treating Quinne Pires/Extender: Dorian Heckle in Treatment: 1 Vital Signs Time Taken: 10:09 Temperature (F): 98.6 Height (in): 71 Pulse (bpm): 108 Weight (lbs): 136 Respiratory Rate (breaths/min): 18 Body Mass Index (  BMI): 19 Blood Pressure (mmHg): 120/80 Reference Range: 80 - 120 mg / dl Electronic Signature(s) Signed: 08/06/2020 11:28:52 AM By: [name] Ito Entered By: [name] Ito on 08/06/2020 10:10:10

## 2020-08-11 ENCOUNTER — Other Ambulatory Visit: Payer: Self-pay

## 2020-08-11 ENCOUNTER — Emergency Department (HOSPITAL_COMMUNITY): Payer: Medicaid Other

## 2020-08-11 ENCOUNTER — Encounter (HOSPITAL_COMMUNITY): Payer: Self-pay | Admitting: Emergency Medicine

## 2020-08-11 ENCOUNTER — Emergency Department (HOSPITAL_COMMUNITY)
Admission: EM | Admit: 2020-08-11 | Discharge: 2020-08-11 | Disposition: A | Discharge status: Home / Self Care | Payer: Medicaid Other | Attending: Emergency Medicine | Admitting: Emergency Medicine

## 2020-08-11 DIAGNOSIS — Z5321 Procedure and treatment not carried out due to patient leaving prior to being seen by health care provider: Secondary | ICD-10-CM | POA: Insufficient documentation

## 2020-08-11 DIAGNOSIS — R0789 Other chest pain: Secondary | ICD-10-CM | POA: Diagnosis not present

## 2020-08-11 DIAGNOSIS — R0602 Shortness of breath: Secondary | ICD-10-CM | POA: Insufficient documentation

## 2020-08-11 DIAGNOSIS — R079 Chest pain, unspecified: Secondary | ICD-10-CM

## 2020-08-11 LAB — BASIC METABOLIC PANEL WITH GFR
Anion gap: 8 (ref 5–15)
BUN: 10 mg/dL (ref 6–20)
CO2: 26 mmol/L (ref 22–32)
Calcium: 8.7 mg/dL — ABNORMAL LOW (ref 8.9–10.3)
Chloride: 101 mmol/L (ref 98–111)
Creatinine, Ser: 0.71 mg/dL (ref 0.61–1.24)
GFR calc Af Amer: >60 mL/min
GFR calc non Af Amer: >60 mL/min
Glucose, Bld: 113 mg/dL — ABNORMAL HIGH (ref 70–99)
Potassium: 3.8 mmol/L (ref 3.5–5.1)
Sodium: 135 mmol/L (ref 135–145)

## 2020-08-11 LAB — CBC
HCT: 31.7 % — ABNORMAL LOW (ref 39.0–52.0)
Hemoglobin: 9.5 g/dL — ABNORMAL LOW (ref 13.0–17.0)
MCH: 21.5 pg — ABNORMAL LOW (ref 26.0–34.0)
MCHC: 30 g/dL (ref 30.0–36.0)
MCV: 71.9 fL — ABNORMAL LOW (ref 80.0–100.0)
Platelets: 635 10*3/uL — ABNORMAL HIGH (ref 150–400)
RBC: 4.41 MIL/uL (ref 4.22–5.81)
RDW: 16.2 % — ABNORMAL HIGH (ref 11.5–15.5)
WBC: 9.6 10*3/uL (ref 4.0–10.5)
nRBC: 0 % (ref 0.0–0.2)

## 2020-08-11 LAB — TROPONIN I (HIGH SENSITIVITY): Troponin I (High Sensitivity): 4 ng/L

## 2020-08-11 NOTE — ED Triage Notes (Signed)
Patient arrived with EMS from home reports mid chest pain this evening with SOB , pain increases with deep inspiration , no emesis or diaphoresis . Denies cough or fever .

## 2020-08-11 NOTE — ED Notes (Signed)
Pt stated they were leaving, wheeled outside to wait on ride

## 2020-08-12 NOTE — Telephone Encounter (Signed)
Prior Berkley Harvey was resubmitted for Tramadol 50 mg #240 to [facility] [employer] .  Approval received for 08/11/2020- 02/07/2021. Faxed to pharmacy and patient notified.

## 2020-08-13 ENCOUNTER — Encounter (HOSPITAL_BASED_OUTPATIENT_CLINIC_OR_DEPARTMENT_OTHER): Payer: Medicaid Other | Admitting: Internal Medicine

## 2020-08-13 ENCOUNTER — Other Ambulatory Visit: Payer: Self-pay

## 2020-08-13 DIAGNOSIS — L89154 Pressure ulcer of sacral region, stage 4: Secondary | ICD-10-CM | POA: Diagnosis not present

## 2020-08-16 NOTE — Progress Notes (Signed)
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 Signed: 08/16/2020 8:08:43 AM By: Baltazar Najjar MD Entered By: Baltazar Najjar on 08/13/2020 13:15:16

## 2020-08-17 NOTE — Progress Notes (Signed)
[name], [name] L. ([id number]) Visit Report for 08/13/2020 Arrival Information Details Patient Name: Date of Service: [name], [name] 08/13/2020 10:15 A M Medical Record Number: [id number] Patient Account Number: [id number] Date of Birth/Sex: Treating RN: 09/12/1991 (29 y.o. [name]) Boehlein, Linda Primary Care Maddox Hlavaty: Raliegh IpStroud, [name] Other Clinician: Referring Nolyn Swab: Treating Delynda Sepulveda/Extender: [name], Michael [name], [name] Weeks in Treatment: 2 Visit Information History Since Last Visit Added or deleted any medications: No Patient Arrived: Wheel Chair Any new allergies or adverse reactions: No Arrival Time: 10:59 Had a fall or experienced change in No Accompanied By: self activities of daily living that may affect Transfer Assistance: None risk of falls: Patient Identification Verified: Yes Signs or symptoms of abuse/neglect since last visito No Secondary Verification Process Completed: Yes Hospitalized since last visit: No Patient Requires Transmission-Based Precautions: No Implantable device outside of the clinic excluding No Patient Has Alerts: No cellular tissue based products placed in the center since last visit: Has Dressing in Place as Prescribed: Yes Pain Present Now: Yes Electronic Signature(s) Signed: 08/17/2020 4:05:29 PM By: Zenaida DeedBoehlein, Linda RN, BSN Entered By: Zenaida DeedBoehlein, Linda on 08/13/2020 11:02:18 -------------------------------------------------------------------------------- Lower Extremity Assessment Details Patient Name: Date of Service: [name], [name] 08/13/2020 10:15 A M Medical Record Number: [id number] Patient Account Number: [id number] Date of Birth/Sex: Treating RN: 08/05/1991 (29 y.o. [name]) Boehlein, Linda Primary Care Marga Gramajo: Raliegh IpStroud, [name] Other Clinician: Referring Wanza Szumski: Treating Judene Logue/Extender: [name], Michael [name], [name] Weeks in Treatment: 2 Edema Assessment Assessed: [Left: No] [Right: No] Edema: [Left: Yes] [Right:  Yes] Calf Left: Right: Point of Measurement: 47 cm From Medial Instep 29.5 cm 29 cm Ankle Left: Right: Point of Measurement: 12 cm From Medial Instep 23 cm 19 cm Vascular Assessment Pulses: Dorsalis Pedis Palpable: [Left:Yes] [Right:Yes] Electronic Signature(s) Signed: 08/17/2020 4:05:29 PM By: Zenaida DeedBoehlein, Linda RN, BSN Entered By: Zenaida DeedBoehlein, Linda on 08/13/2020 11:16:53 -------------------------------------------------------------------------------- Multi Wound Chart Details Patient Name: Date of Service: [name], [name] 08/13/2020 10:15 A M Medical Record Number: [id number] Patient Account Number: [id number] Date of Birth/Sex: Treating RN: 11/30/1990 (29 y.o. [name]) Dwiggins, Shannon Primary Care Kayvon Mo: Raliegh IpStroud, [name] Other Clinician: Referring Bodey Frizell: Treating Delaney Perona/Extender: [name], Michael [name], [name] Weeks in Treatment: 2 Vital Signs Height(in): 71 Pulse(bpm): 108 Weight(lbs): 136 Blood Pressure(mmHg): 135/85 Body Mass Index(BMI): 19 Temperature(F): 97.7 Respiratory Rate(breaths/min): 18 Photos: [2:No Photos Right Achilles] [3:No Photos Left, Lateral Malleolus] [4:No Photos Sacrum] Wound Location: [2:Gradually Appeared] [3:Gradually Appeared] [4:Gradually Appeared] Wounding Event: [2:Pressure Ulcer] [3:Pressure Ulcer] [4:Pressure Ulcer] Primary Etiology: [2:Paraplegia] [3:Paraplegia] [4:Paraplegia] Comorbid History: [2:06/27/2020] [3:06/27/2020] [4:05/27/2020] Date Acquired: [2:2] [3:2] [4:2] Weeks of Treatment: [2:Open] [3:Open] [4:Open] Wound Status: [2:1x1.1x0.1] [3:1.5x2.4x0.6] [4:6.5x4x1.5] Measurements L x W x D (cm) [2:0.864] [3:2.827] [4:20.42] A (cm) : rea [2:0.086] [3:1.696] [4:30.631] Volume (cm) : [2:18.50%] [3:21.80%] [4:-44.40%] % Reduction in A rea: [2:18.90%] [3:21.80%] [4:-14.00%] % Reduction in Volume: [4:12] Starting Position 1 (o'clock): [4:12] Ending Position 1 (o'clock): [4:2] Maximum Distance 1 (cm): [2:No] [3:No]  [4:Yes] Undermining: [2:Category/Stage III] [3:Category/Stage IV] [4:Category/Stage IV] Classification: [2:Medium] [3:Medium] [4:Medium] Exudate A mount: [2:Serosanguineous] [3:Serosanguineous] [4:Serosanguineous] Exudate Type: [2:red, brown] [3:red, brown] [4:red, brown] Exudate Color: [2:No] [3:Yes] [4:No] Foul Odor A Cleansing: [2:fter N/A] [3:No] [4:N/A] Odor A nticipated Due to Product Use: [2:Flat and Intact] [3:Well defined, not attached] [4:Well defined, not attached] Wound Margin: [2:Small (1-33%)] [3:None Present (0%)] [4:Medium (34-66%)] Granulation A mount: [2:Red] [3:N/A] [4:Pink] Granulation Quality: [2:Large (67-100%)] [3:Large (67-100%)] [4:Medium (34-66%)] Necrotic A mount: [2:Adherent Slough] [3:Adherent Slough] [4:Eschar, Adherent Slough] Necrotic Tissue: [2:Fat Layer (Subcutaneous Tissue):  Yes Fat Layer (Subcutaneous Tissue): Yes Fat Layer (Subcutaneous Tissue): Yes] Exposed Structures: [2:Fascia: No Tendon: No Muscle: No Joint: No Bone: No None] [3:Tendon: Yes Fascia: No Muscle: No Joint: No Bone: No None] [4:Bone: Yes Fascia: No Tendon: No Muscle: No Joint: No None] Epithelialization: [2:N/A] [3:Debridement - Excisional] [4:Chemical/Enzymatic/Mechanical] Debridement: Pre-procedure Verification/Time Out N/A [3:11:38] [4:N/A] Taken: [2:N/A] [3:Other] [4:N/A] Pain Control: [2:N/A] [3:Subcutaneous] [4:N/A] Tissue Debrided: [2:N/A] [3:Skin/Subcutaneous Tissue] [4:N/A] Level: [2:N/A] [3:3.6] [4:N/A] Debridement A (sq cm): [2:rea N/A] [3:Curette] [4:N/A] Instrument: [2:N/A] [3:Minimum] [4:None] Bleeding: [2:N/A] [3:0] [4:N/A] Procedural Pain: [2:N/A] [3:0] [4:N/A] Post Procedural Pain: [2:N/A] [3:Procedure was tolerated well] [4:Procedure was tolerated well] Debridement Treatment Response: [2:N/A] [3:1.5x2.4x0.6] [4:6.5x4x1.5] Post Debridement Measurements L x W x D (cm) [2:N/A] [3:1.696] [4:30.631] Post Debridement Volume: (cm) [2:N/A] [3:Category/Stage IV]  [4:Category/Stage IV] Post Debridement Stage: [2:N/A] [3:Debridement] [4:N/A] Wound Number: 5 N/A N/A Photos: No Photos N/A N/A Left Gluteal fold N/A N/A Wound Location: Pressure Injury N/A N/A Wounding Event: Pressure Ulcer N/A N/A Primary Etiology: Paraplegia N/A N/A Comorbid History: 08/06/2020 N/A N/A Date Acquired: 0 N/A N/A Weeks of Treatment: Open N/A N/A Wound Status: 2.5x1.5x0.1 N/A N/A Measurements L x W x D (cm) 2.945 N/A N/A A (cm) : rea 0.295 N/A N/A Volume (cm) : 0.00% N/A N/A % Reduction in A rea: 0.00% N/A N/A % Reduction in Volume: No N/A N/A Undermining: Category/Stage III N/A N/A Classification: Medium N/A N/A Exudate A mount: Serosanguineous N/A N/A Exudate Type: red, brown N/A N/A Exudate Color: No N/A N/A Foul Odor A Cleansing: fter N/A N/A N/A Odor A nticipated Due to Product Use: Distinct, outline attached N/A N/A Wound Margin: Large (67-100%) N/A N/A Granulation A mount: Red, Pink N/A N/A Granulation Quality: Small (1-33%) N/A N/A Necrotic A mount: Adherent Slough N/A N/A Necrotic Tissue: Fat Layer (Subcutaneous Tissue): Yes N/A N/A Exposed Structures: Fascia: No Tendon: No Muscle: No Joint: No Bone: No None N/A N/A Epithelialization: N/A N/A N/A Debridement: N/A N/A N/A Pain Control: N/A N/A N/A Tissue Debrided: N/A N/A N/A Level: N/A N/A N/A Debridement A (sq cm): rea N/A N/A N/A Instrument: N/A N/A N/A Bleeding: N/A N/A N/A Procedural Pain: N/A N/A N/A Post Procedural Pain: Debridement Treatment Response: N/A N/A N/A Post Debridement Measurements L x N/A N/A N/A W x D (cm) N/A N/A N/A Post Debridement Volume: (cm) N/A N/A N/A Post Debridement Stage: N/A N/A N/A Procedures Performed: Treatment Notes Electronic Signature(s) Signed: 08/16/2020 8:08:43 AM By: Baltazar Najjar MD Signed: 08/16/2020 1:33:30 PM By: Cherylin Mylar Entered By: Baltazar Najjar on 08/13/2020  13:06:57 -------------------------------------------------------------------------------- Multi-Disciplinary Care Plan Details Patient Name: Date of Service: [name], [name] 08/13/2020 10:15 A M Medical Record Number: [id number] Patient Account Number: [id number] Date of Birth/Sex: Treating RN: December 13, 1990 (29 y.o. [name] Right Primary Care Brendaliz Kuk: [name] Other Clinician: Referring Selvin Yun: Treating Dakota Vanwart/Extender: Dorian Heckle in Treatment: 2 Active Inactive Orientation to the Wound Care Program Nursing Diagnoses: Knowledge deficit related to the wound healing center program Goals: Patient/caregiver will verbalize understanding of the Wound Healing Center Program Date Initiated: 07/30/2020 Target Resolution Date: 08/27/2020 Goal Status: Active Interventions: Provide education on orientation to the wound center Notes: Pressure Nursing Diagnoses: Knowledge deficit related to causes and risk factors for pressure ulcer development Goals: Patient/caregiver will verbalize risk factors for pressure ulcer development Date Initiated: 07/30/2020 Target Resolution Date: 08/27/2020 Goal Status: Active Interventions: Provide education on pressure ulcers Notes: Wound/Skin Impairment Nursing Diagnoses: Impaired tissue integrity Goals: Ulcer/skin breakdown  will have a volume reduction of 50% by week 8 Date Initiated: 07/30/2020 Target Resolution Date: 08/27/2020 Goal Status: Active Interventions: Provide education on ulcer and skin care Notes: Electronic Signature(s) Signed: 08/16/2020 1:33:30 PM By: Cherylin Mylar Entered By: Cherylin Mylar on 08/13/2020 11:43:54 -------------------------------------------------------------------------------- Pain Assessment Details Patient Name: Date of Service: [name], [name] L. 08/13/2020 10:15 A M Medical Record Number: [id number] Patient Account Number: [id number] Date of  Birth/Sex: Treating RN: 1991-07-31 (29 y.o. [name] Schooner Primary Care Saxton Chain: [name] Other Clinician: Referring Antrice Pal: Treating Lashanda Storlie/Extender: Dorian Heckle in Treatment: 2 Active Problems Location of Pain Severity and Description of Pain Patient Has Paino No Site Locations Rate the pain. Rate the pain. Current Pain Level: 0 Pain Management and Medication Current Pain Management: Electronic Signature(s) Signed: 08/17/2020 4:05:29 PM By: Zenaida Deed RN, BSN Entered By: Zenaida Deed on 08/13/2020 11:08:58 -------------------------------------------------------------------------------- Patient/Caregiver Education Details Patient Name: Date of Service: [name] Regulus 9/17/2021andnbsp10:15 A M Medical Record Number: [id number] Patient Account Number: [id number] Date of Birth/Gender: Treating RN: 06/18/1991 (29 y.o. [name] Right Primary Care Physician: [name] Other Clinician: Referring Physician: Treating Physician/Extender: Dorian Heckle in Treatment: 2 Education Assessment Education Provided To: Patient Education Topics Provided Wound/Skin Impairment: Handouts: Caring for Your Ulcer Methods: Explain/Verbal Responses: State content correctly Electronic Signature(s) Signed: 08/16/2020 1:33:30 PM By: Cherylin Mylar Entered By: Cherylin Mylar on 08/13/2020 11:44:10 -------------------------------------------------------------------------------- Wound Assessment Details Patient Name: Date of Service: [name], [name] NIO L. 08/13/2020 10:15 A M Medical Record Number: [id number] Patient Account Number: [id number] Date of Birth/Sex: Treating RN: 06-10-1991 (29 y.o. [name] Schooner Primary Care Mancil Pfenning: [name] Other Clinician: Referring Sukanya Goldblatt: Treating Lovell Roe/Extender: Dorian Heckle in Treatment: 2 Wound Status Wound Number:  2 Primary Etiology: Pressure Ulcer Wound Location: Right Achilles Wound Status: Open Wounding Event: Gradually Appeared Comorbid History: Paraplegia Date Acquired: 06/27/2020 Weeks Of Treatment: 2 Clustered Wound: No Photos Photo Uploaded By: Benjaman Kindler on 08/17/2020 11:47:16 Wound Measurements Length: (cm) 1 Width: (cm) 1.1 Depth: (cm) 0.1 Area: (cm) 0.864 Volume: (cm) 0.086 % Reduction in Area: 18.5% % Reduction in Volume: 18.9% Epithelialization: None Tunneling: No Undermining: No Wound Description Classification: Category/Stage III Wound Margin: Flat and Intact Exudate Amount: Medium Exudate Type: Serosanguineous Exudate Color: red, brown Foul Odor After Cleansing: No Slough/Fibrino Yes Wound Bed Granulation Amount: Small (1-33%) Exposed Structure Granulation Quality: Red Fascia Exposed: No Necrotic Amount: Large (67-100%) Fat Layer (Subcutaneous Tissue) Exposed: Yes Necrotic Quality: Adherent Slough Tendon Exposed: No Muscle Exposed: No Joint Exposed: No Bone Exposed: No Electronic Signature(s) Signed: 08/17/2020 4:05:29 PM By: Zenaida Deed RN, BSN Entered By: Zenaida Deed on 08/13/2020 11:15:19 -------------------------------------------------------------------------------- Wound Assessment Details Patient Name: Date of Service: [name] March L. 08/13/2020 10:15 A M Medical Record Number: [id number] Patient Account Number: [id number] Date of Birth/Sex: Treating RN: Jan 15, 1991 (29 y.o. [name] Schooner Primary Care Mattison Stuckey: [name] Other Clinician: Referring Audrie Kuri: Treating Jhaden Pizzuto/Extender: Dorian Heckle in Treatment: 2 Wound Status Wound Number: 3 Primary Etiology: Pressure Ulcer Wound Location: Left, Lateral Malleolus Wound Status: Open Wounding Event: Gradually Appeared Comorbid History: Paraplegia Date Acquired: 06/27/2020 Weeks Of Treatment: 2 Clustered Wound: No Photos Photo Uploaded By:  Benjaman Kindler on 08/17/2020 11:33:11 Wound Measurements Length: (cm) 1.5 Width: (cm) 2.4 Depth: (cm) 0.6 Area: (cm) 2.827 Volume: (cm) 1.696 % Reduction in Area: 21.8% % Reduction in Volume: 21.8% Epithelialization: None Tunneling: No Undermining: No Wound Description Classification: Category/Stage  IV Wound Margin: Well defined, not attached Exudate Amount: Medium Exudate Type: Serosanguineous Exudate Color: red, brown Foul Odor After Cleansing: Yes Due to Product Use: No Slough/Fibrino Yes Wound Bed Granulation Amount: None Present (0%) Exposed Structure Necrotic Amount: Large (67-100%) Fascia Exposed: No Necrotic Quality: Adherent Slough Fat Layer (Subcutaneous Tissue) Exposed: Yes Tendon Exposed: Yes Muscle Exposed: No Joint Exposed: No Bone Exposed: No Electronic Signature(s) Signed: 08/17/2020 4:05:29 PM By: Zenaida Deed RN, BSN Entered By: Zenaida Deed on 08/13/2020 11:16:05 -------------------------------------------------------------------------------- Wound Assessment Details Patient Name: Date of Service: [name] March L. 08/13/2020 10:15 A M Medical Record Number: [id number] Patient Account Number: [id number] Date of Birth/Sex: Treating RN: 01-26-1991 (29 y.o. [name] Right Primary Care Jourdin Connors: [name] Other Clinician: Referring Tytan Sandate: Treating Payden Bonus/Extender: Dorian Heckle in Treatment: 2 Wound Status Wound Number: 4 Primary Etiology: Pressure Ulcer Wound Location: Sacrum Wound Status: Open Wounding Event: Gradually Appeared Comorbid History: Paraplegia Date Acquired: 05/27/2020 Weeks Of Treatment: 2 Clustered Wound: No Photos Photo Uploaded By: Benjaman Kindler on 08/17/2020 11:47:17 Wound Measurements Length: (cm) 6.5 Width: (cm) 4 Depth: (cm) 1.5 Area: (cm) 20.42 Volume: (cm) 30.631 % Reduction in Area: -44.4% % Reduction in Volume: -14% Epithelialization: None Tunneling:  No Undermining: Yes Starting Position (o'clock): 12 Ending Position (o'clock): 12 Maximum Distance: (cm) 2 Wound Description Classification: Category/Stage IV Wound Margin: Well defined, not attached Exudate Amount: Medium Exudate Type: Serosanguineous Exudate Color: red, brown Foul Odor After Cleansing: No Slough/Fibrino Yes Wound Bed Granulation Amount: Medium (34-66%) Exposed Structure Granulation Quality: Pink Fascia Exposed: No Necrotic Amount: Medium (34-66%) Fat Layer (Subcutaneous Tissue) Exposed: Yes Necrotic Quality: Eschar, Adherent Slough Tendon Exposed: No Muscle Exposed: No Joint Exposed: No Bone Exposed: Yes Electronic Signature(s) Signed: 08/16/2020 1:33:30 PM By: Cherylin Mylar Entered By: Cherylin Mylar on 08/13/2020 11:26:57 -------------------------------------------------------------------------------- Wound Assessment Details Patient Name: Date of Service: [name], [name] NIO L. 08/13/2020 10:15 A M Medical Record Number: [id number] Patient Account Number: [id number] Date of Birth/Sex: Treating RN: 26-Nov-1991 (29 y.o. [name] Right Primary Care Trease Bremner: [name] Other Clinician: Referring Milad Bublitz: Treating Roby Spalla/Extender: Dorian Heckle in Treatment: 2 Wound Status Wound Number: 5 Primary Etiology: Pressure Ulcer Wound Location: Left Gluteal fold Wound Status: Open Wounding Event: Pressure Injury Comorbid History: Paraplegia Date Acquired: 08/06/2020 Weeks Of Treatment: 0 Clustered Wound: No Photos Photo Uploaded By: Benjaman Kindler on 08/17/2020 11:52:33 Wound Measurements Length: (cm) 2.5 Width: (cm) 1.5 Depth: (cm) 0.1 Area: (cm) 2.945 Volume: (cm) 0.295 % Reduction in Area: 0% % Reduction in Volume: 0% Epithelialization: None Tunneling: No Undermining: No Wound Description Classification: Category/Stage III Wound Margin: Distinct, outline attached Exudate Amount: Medium Exudate  Type: Serosanguineous Exudate Color: red, brown Foul Odor After Cleansing: No Slough/Fibrino Yes Wound Bed Granulation Amount: Large (67-100%) Exposed Structure Granulation Quality: Red, Pink Fascia Exposed: No Necrotic Amount: Small (1-33%) Fat Layer (Subcutaneous Tissue) Exposed: Yes Necrotic Quality: Adherent Slough Tendon Exposed: No Muscle Exposed: No Joint Exposed: No Bone Exposed: No Electronic Signature(s) Signed: 08/16/2020 1:33:30 PM By: Cherylin Mylar Entered By: Cherylin Mylar on 08/13/2020 11:36:03 -------------------------------------------------------------------------------- Vitals Details Patient Name: Date of Service: [name] March L. 08/13/2020 10:15 A M Medical Record Number: [id number] Patient Account Number: [id number] Date of Birth/Sex: Treating RN: 07-08-1991 (29 y.o. [name] Schooner Primary Care Meridian Scherger: [name] Other Clinician: Referring Tieler Cournoyer: Treating Riku Buttery/Extender: Dorian Heckle in Treatment: 2 Vital Signs Time Taken: 11:02 Temperature (F): 97.7 Height (in): 71 Pulse (bpm): 108 Source:  Stated Respiratory Rate (breaths/min): 18 Weight (lbs): 136 Blood Pressure (mmHg): 135/85 Source: Stated Reference Range: 80 - 120 mg / dl Body Mass Index (BMI): 19 Electronic Signature(s) Signed: 08/17/2020 4:05:29 PM By: Zenaida Deed RN, BSN Entered By: Zenaida Deed on 08/13/2020 11:02:53

## 2020-08-19 ENCOUNTER — Telehealth: Payer: Self-pay

## 2020-08-20 ENCOUNTER — Emergency Department (HOSPITAL_COMMUNITY): Payer: Medicaid Other

## 2020-08-20 ENCOUNTER — Encounter: Payer: Medicaid Other | Admitting: Physical Medicine and Rehabilitation

## 2020-08-20 ENCOUNTER — Emergency Department (HOSPITAL_COMMUNITY)
Admission: EM | Admit: 2020-08-20 | Discharge: 2020-08-20 | Disposition: A | Discharge status: Home / Self Care | Payer: Medicaid Other | Attending: Emergency Medicine | Admitting: Emergency Medicine

## 2020-08-20 ENCOUNTER — Emergency Department (HOSPITAL_BASED_OUTPATIENT_CLINIC_OR_DEPARTMENT_OTHER): Payer: Medicaid Other

## 2020-08-20 ENCOUNTER — Other Ambulatory Visit: Payer: Self-pay

## 2020-08-20 DIAGNOSIS — F1590 Other stimulant use, unspecified, uncomplicated: Secondary | ICD-10-CM | POA: Insufficient documentation

## 2020-08-20 DIAGNOSIS — I2699 Other pulmonary embolism without acute cor pulmonale: Secondary | ICD-10-CM

## 2020-08-20 DIAGNOSIS — I82402 Acute embolism and thrombosis of unspecified deep veins of left lower extremity: Secondary | ICD-10-CM | POA: Diagnosis not present

## 2020-08-20 DIAGNOSIS — I824Z2 Acute embolism and thrombosis of unspecified deep veins of left distal lower extremity: Secondary | ICD-10-CM

## 2020-08-20 DIAGNOSIS — R2242 Localized swelling, mass and lump, left lower limb: Secondary | ICD-10-CM | POA: Diagnosis present

## 2020-08-20 DIAGNOSIS — F17200 Nicotine dependence, unspecified, uncomplicated: Secondary | ICD-10-CM | POA: Insufficient documentation

## 2020-08-20 DIAGNOSIS — L89154 Pressure ulcer of sacral region, stage 4: Secondary | ICD-10-CM | POA: Diagnosis not present

## 2020-08-20 DIAGNOSIS — M7989 Other specified soft tissue disorders: Secondary | ICD-10-CM

## 2020-08-20 DIAGNOSIS — R000 Tachycardia, unspecified: Secondary | ICD-10-CM | POA: Insufficient documentation

## 2020-08-20 DIAGNOSIS — L03315 Cellulitis of perineum: Secondary | ICD-10-CM | POA: Diagnosis not present

## 2020-08-20 LAB — CBC WITH DIFFERENTIAL/PLATELET
Abs Immature Granulocytes: 0.07 10*3/uL (ref 0.00–0.07)
Basophils Absolute: 0 10*3/uL (ref 0.0–0.1)
Basophils Relative: 0 %
Eosinophils Absolute: 0 10*3/uL (ref 0.0–0.5)
Eosinophils Relative: 0 %
HCT: 32.1 % — ABNORMAL LOW (ref 39.0–52.0)
Hemoglobin: 9.4 g/dL — ABNORMAL LOW (ref 13.0–17.0)
Immature Granulocytes: 1 %
Lymphocytes Relative: 6 %
Lymphs Abs: 0.9 10*3/uL (ref 0.7–4.0)
MCH: 20.7 pg — ABNORMAL LOW (ref 26.0–34.0)
MCHC: 29.3 g/dL — ABNORMAL LOW (ref 30.0–36.0)
MCV: 70.5 fL — ABNORMAL LOW (ref 80.0–100.0)
Monocytes Absolute: 0.8 10*3/uL (ref 0.1–1.0)
Monocytes Relative: 6 %
Neutro Abs: 12.5 10*3/uL — ABNORMAL HIGH (ref 1.7–7.7)
Neutrophils Relative %: 87 %
Platelets: 628 10*3/uL — ABNORMAL HIGH (ref 150–400)
RBC: 4.55 MIL/uL (ref 4.22–5.81)
RDW: 16.4 % — ABNORMAL HIGH (ref 11.5–15.5)
WBC: 14.3 10*3/uL — ABNORMAL HIGH (ref 4.0–10.5)
nRBC: 0 % (ref 0.0–0.2)

## 2020-08-20 LAB — COMPREHENSIVE METABOLIC PANEL
ALT: 22 U/L (ref 0–44)
AST: 27 U/L (ref 15–41)
Albumin: 2.2 g/dL — ABNORMAL LOW (ref 3.5–5.0)
Alkaline Phosphatase: 135 U/L — ABNORMAL HIGH (ref 38–126)
Anion gap: 8 (ref 5–15)
BUN: 12 mg/dL (ref 6–20)
CO2: 29 mmol/L (ref 22–32)
Calcium: 8.5 mg/dL — ABNORMAL LOW (ref 8.9–10.3)
Chloride: 94 mmol/L — ABNORMAL LOW (ref 98–111)
Creatinine, Ser: 0.73 mg/dL (ref 0.61–1.24)
GFR calc Af Amer: >60 mL/min (ref >60)
GFR calc non Af Amer: >60 mL/min (ref >60)
Glucose, Bld: 118 mg/dL — ABNORMAL HIGH (ref 70–99)
Potassium: 3.6 mmol/L (ref 3.5–5.1)
Sodium: 131 mmol/L — ABNORMAL LOW (ref 135–145)
Total Bilirubin: 0.6 mg/dL (ref 0.3–1.2)
Total Protein: 8 g/dL (ref 6.5–8.1)

## 2020-08-20 LAB — URINALYSIS, ROUTINE W REFLEX MICROSCOPIC
Bilirubin Urine: NEGATIVE
Glucose, UA: NEGATIVE mg/dL
Hgb urine dipstick: NEGATIVE
Ketones, ur: 5 mg/dL — AB
Nitrite: NEGATIVE
Protein, ur: 30 mg/dL — AB
Specific Gravity, Urine: 1.02 (ref 1.005–1.030)
WBC, UA: >50 WBC/hpf — ABNORMAL HIGH (ref 0–5)
pH: 5 (ref 5.0–8.0)

## 2020-08-20 LAB — TROPONIN I (HIGH SENSITIVITY): Troponin I (High Sensitivity): 6 ng/L (ref <18)

## 2020-08-20 LAB — LACTIC ACID, PLASMA: Lactic Acid, Venous: 0.6 mmol/L (ref 0.5–1.9)

## 2020-08-20 MED ORDER — LACTATED RINGERS IV SOLN: INTRAVENOUS | Status: DC

## 2020-08-20 MED ORDER — RIVAROXABAN (XARELTO) VTE STARTER PACK (15 & 20 MG): 15.0000 mg | ORAL_TABLET | Freq: Two times a day (BID) | ORAL | 0 refills | Status: DC

## 2020-08-20 MED ORDER — OXYCODONE HCL 10 MG PO TABS
15.0000 mg | ORAL_TABLET | Freq: Four times a day (QID) | ORAL | 0 refills | Status: DC | PRN
Start: 2020-08-20 — End: 2020-09-20

## 2020-08-20 MED ORDER — RIVAROXABAN (XARELTO) EDUCATION KIT FOR DVT/PE PATIENTS
PACK | Freq: Once | Status: DC
  Filled 2020-08-20: qty 1

## 2020-08-20 MED ORDER — DOXYCYCLINE HYCLATE 100 MG PO CAPS: 100.0000 mg | ORAL_CAPSULE | Freq: Two times a day (BID) | ORAL | 0 refills | Status: DC

## 2020-08-20 MED ORDER — TRAMADOL HCL 50 MG PO TABS
100.0000 mg | ORAL_TABLET | Freq: Four times a day (QID) | ORAL | 1 refills | Status: DC
Start: 2020-08-20 — End: 2020-10-28

## 2020-08-20 MED ORDER — RIVAROXABAN 15 MG PO TABS
15.0000 mg | ORAL_TABLET | Freq: Two times a day (BID) | ORAL | Status: DC
  Filled 2020-08-20: qty 1

## 2020-08-20 MED ORDER — LEVOFLOXACIN 500 MG PO TABS: 500.0000 mg | ORAL_TABLET | Freq: Every day | ORAL | 0 refills | Status: DC

## 2020-08-20 MED ORDER — RIVAROXABAN 20 MG PO TABS: 20.0000 mg | ORAL_TABLET | Freq: Every day | ORAL | Status: DC

## 2020-08-20 NOTE — ED Notes (Signed)
PT left AMA, pt received d/c papers and verbalized understanding. PT left via wheelchair to lobby.
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
## 2020-08-20 NOTE — Progress Notes (Signed)
Left lower extremity venous duplex has been completed. Preliminary results can be found in CV Proc through chart review.  Results were given to Dr. Anitra Lauth.  08/20/20 12:40 PM Olen Cordial RVT

## 2020-08-20 NOTE — Telephone Encounter (Signed)
Pt came for scheduled appointment, however had low grade fever- 99.7  Went to see in lobby- pt c/o L thigh swelling which is abrupt onset, and won't get smaller- large amount of swelling.   Also has worsening Stage IV pressure ulcer- and smells very badly- like pseudomonas.  As sitting there.   Pt also c/o intermittent chest pain.  Actually went to ED last week, but left due to how bad chest pain was- on L side.   Based on exam, I am concerned about DVT and PE due to chest pain and Swelling of LLE.  Also has stage IV pressure ulcer that smells very malodorous, even in ventilated space- he had been scheduled for CT to look for osteomyelitis, but not to be done til 08/31/20- I'm concerned he needs osteomyelitis and needs it done earlier. Actually MRI might be a better choice.   Pt is hemodynamically stable, but I don't have a way to get these tests done, and concerned if Leg clot moves/he could become unstable.  I have sent him to ER/ED.   His transportation won't take him to ED, so we've called 911 for nonemergency transport to ED.   We printed face sheet for them as well.   Will also refill pain meds- is due.    Of notes, spasms have been worse- which is also sign of infection.

## 2020-08-20 NOTE — Discharge Instructions (Addendum)
Today we found that you had a very large blood clot in your left leg.  You most likely also have blood clots in your lungs.  We did recommend that you be admitted to the hospital but if you change your mind you are always welcome to return.  Also there is concern because your white blood cell count is elevated that you have infection on the wound of your sacrum.  A prescription for antibiotics and blood thinner was sent to your pharmacy.  However the blood clot in your leg could move and it could cause sudden death.
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
## 2020-08-21 LAB — URINE CULTURE

## 2020-08-25 LAB — CULTURE, BLOOD (ROUTINE X 2)
Culture: NO GROWTH
Culture: NO GROWTH
Special Requests: ADEQUATE
Special Requests: ADEQUATE

## 2020-08-27 ENCOUNTER — Encounter (HOSPITAL_BASED_OUTPATIENT_CLINIC_OR_DEPARTMENT_OTHER): Payer: Medicaid Other | Attending: Internal Medicine | Admitting: Internal Medicine

## 2020-08-27 DIAGNOSIS — L89322 Pressure ulcer of left buttock, stage 2: Secondary | ICD-10-CM | POA: Insufficient documentation

## 2020-08-27 DIAGNOSIS — L89154 Pressure ulcer of sacral region, stage 4: Secondary | ICD-10-CM | POA: Insufficient documentation

## 2020-08-27 DIAGNOSIS — E43 Unspecified severe protein-calorie malnutrition: Secondary | ICD-10-CM | POA: Diagnosis not present

## 2020-08-27 DIAGNOSIS — Z681 Body mass index (BMI) 19 or less, adult: Secondary | ICD-10-CM | POA: Diagnosis not present

## 2020-08-27 DIAGNOSIS — G8221 Paraplegia, complete: Secondary | ICD-10-CM | POA: Insufficient documentation

## 2020-08-27 DIAGNOSIS — L89520 Pressure ulcer of left ankle, unstageable: Secondary | ICD-10-CM | POA: Diagnosis not present

## 2020-08-27 NOTE — Progress Notes (Signed)
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 of the pelvis which apparently is on October [name] week to 2. Based on this we will know how to look at the antibiotics which will probably be IV Electronic Signature(s) Signed: 08/27/2020 4:24:19 PM By: Baltazar Najjar MD Entered By: Baltazar Najjar on 08/27/2020 16:05:03 -------------------------------------------------------------------------------- SuperBill Details Patient Name: Date of Service: [name] Regulus. 08/27/2020 Medical Record Number: [id number] Patient Account Number: [id number] Date of Birth/Sex: Treating RN: [birth year] (29 y.o. 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

## 2020-08-27 NOTE — Progress Notes (Signed)
[name], [name] ([id number]) Visit Report for 08/27/2020 Arrival Information Details Patient Name: Date of Service: [name], [name] 08/27/2020 2:30 PM Medical Record Number: [id number] Patient Account Number: [id number] Date of Birth/Sex: Treating RN: [name] (29 y.o. [name] Primary Care [name]: [name] Other Clinician: Referring [name]: Treating [name]/Extender: [name] in Treatment: 4 Visit Information History Since Last Visit Added or deleted any medications: Yes Patient Arrived: Wheel Chair Any new allergies or adverse reactions: No Arrival Time: 14:34 Had [name] fall or experienced change in No Accompanied By: self activities of daily living that may affect Transfer Assistance: None risk of falls: Patient Identification Verified: Yes Signs or symptoms of abuse/neglect since last visito No Secondary Verification Process Completed: Yes Hospitalized since last visit: No Patient Requires Transmission-Based Precautions: No Implantable device outside of the clinic excluding No Patient Has Alerts: No cellular tissue based products placed in the center since last visit: Has Dressing in Place as Prescribed: Yes Pain Present Now: No Electronic Signature(s) Signed: 08/27/2020 4:31:44 PM By: Zenaida Deed RN, BSN Entered By: Zenaida Deed on 08/27/2020 14:37:43 -------------------------------------------------------------------------------- Encounter Discharge Information Details Patient Name: Date of Service: [name]. 08/27/2020 2:30 PM Medical Record Number: [id number] Patient Account Number: [id number] Date of Birth/Sex: Treating RN: [birth year] [name] (29 y.o. [name] Primary Care Ricarda Atayde: [name] Other Clinician: Referring Jailin Manocchio: Treating Aqua Denslow/Extender: [name] in Treatment: 4 Encounter Discharge Information Items Post Procedure Vitals Discharge  Condition: Stable Temperature (F): 98.2 Ambulatory Status: Wheelchair Pulse (bpm): 123 Discharge Destination: Home Respiratory Rate (breaths/min): 18 Transportation: Private Auto Blood Pressure (mmHg): 129/79 Accompanied By: self Schedule Follow-up Appointment: Yes Clinical Summary of Care: Patient Declined Electronic Signature(s) Signed: 08/27/2020 4:20:16 PM By: Yevonne Pax RN Entered By: Yevonne Pax on 08/27/2020 15:52:01 -------------------------------------------------------------------------------- Lower Extremity Assessment Details Patient Name: Date of Service: [name], [name] 08/27/2020 2:30 PM Medical Record Number: [id number] Patient Account Number: [id number] Date of Birth/Sex: Treating RN: [birth year] (29 y.o. [name] Primary Care Cathryne Mancebo: [name] Other Clinician: Referring Lyndon Chenoweth: Treating Hend Mccarrell/Extender: [name] in Treatment: 4 Edema Assessment Assessed: [name]: No] [Right: No] Edema: [Left: No] [Right: No] Calf Left: Right: Point of Measurement: 47 cm From Medial Instep 29.5 cm 29 cm Ankle Left: Right: Point of Measurement: 12 cm From Medial Instep 23 cm 19 cm Vascular Assessment Pulses: Dorsalis Pedis Palpable: [Left:Yes] [Right:Yes] Electronic Signature(s) Signed: 08/27/2020 4:31:44 PM By: Zenaida Deed RN, BSN Entered By: Zenaida Deed on 08/27/2020 15:04:14 -------------------------------------------------------------------------------- Multi Wound Chart Details Patient Name: Date of Service: [name] March L. 08/27/2020 2:30 PM Medical Record Number: [id number] Patient Account Number: [id number] Date of Birth/Sex: Treating RN: [birth year][name] (29 y.o. [name] Primary Care Jaela Yepez: [name] Other Clinician: Referring Tisha Cline: Treating Cieara Stierwalt/Extender: [name] in Treatment: 4 Vital Signs Height(in): 71 Pulse(bpm):  123 Weight(lbs): 136 Blood Pressure(mmHg): 129/79 Body Mass Index(BMI): 19 Temperature(F): 98.2 Respiratory Rate(breaths/min): 18 Photos: [2:No Photos Right Achilles] [3:No Photos Left, Lateral Malleolus] [4:No Photos Sacrum] Wound Location: [2:Gradually Appeared] [3:Gradually Appeared] [4:Gradually Appeared] Wounding Event: [2:Pressure Ulcer] [3:Pressure Ulcer] [4:Pressure Ulcer] Primary Etiology: [2:Paraplegia] [3:Paraplegia] [4:Paraplegia] Comorbid History: [2:06/27/2020] [3:06/27/2020] [4:05/27/2020] Date Acquired: [2:4] [3:4] [4:4] Weeks of Treatment: [2:Open] [3:Open] [4:Open] Wound Status: [2:0.9x1.2x0.2] [3:3x3.4x0.9] [4:7.1x4.2x1.6] Measurements L x W x D (cm) [2:0.848] [3:8.011] [4:23.421] [name] (cm) : rea [2:0.17] [3:7.21] [4:37.473] Volume (cm) : [2:20.00%] [3:-121.70%] [4:-65.70%] % Reduction in [name] rea: [2:-60.40%] [3:-232.60%] [4:-39.50%] %  Reduction in Volume: [3:6] Starting Position 1 (o'clock): [3:1] Ending Position 1 (o'clock): [3:0.8] Maximum Distance 1 (cm): [2:No] [3:Yes] [4:No] Undermining: [2:Category/Stage III] [3:Category/Stage IV] [4:Category/Stage IV] Classification: [2:Medium] [3:Medium] [4:Medium] Exudate [name] mount: [2:Serosanguineous] [3:Purulent] [4:Serosanguineous] Exudate Type: [2:red, brown] [3:yellow, brown, green] [4:red, brown] Exudate Color: [2:No] [3:Yes] [4:No] Foul Odor [name] Cleansing: [2:fter N/[name]] [3:No] [4:N/[name]] Odor [name] nticipated Due to Product Use: [2:Flat and Intact] [3:Epibole] [4:Well defined, not attached] Wound Margin: [2:Small (1-33%)] [3:None Present (0%)] [4:Large (67-100%)] Granulation [name] mount: [2:Red] [3:N/[name]] [4:Pink, Pale] Granulation Quality: [2:Large (67-100%)] [3:Large (67-100%)] [4:Small (1-33%)] Necrotic [name] mount: [2:Fat Layer (Subcutaneous Tissue): Yes Fat Layer (Subcutaneous Tissue): Yes Fat Layer (Subcutaneous Tissue): Yes] Exposed Structures: [2:Fascia: No Tendon: No Muscle: No Joint: No Bone: No None] [3:Tendon: Yes Bone: Yes  Fascia: No Muscle: No Joint: No None] [4:Bone: Yes Fascia: No Tendon: No Muscle: No Joint: No None] Epithelialization: [2:N/[name]] [3:Debridement - Excisional] [4:N/[name]] Debridement: Pre-procedure Verification/Time Out N/[name] [3:15:22] [4:N/[name]] Taken: [2:N/[name]] [3:Other] [4:N/[name]] Pain Control: [2:N/[name]] [3:Necrotic/Eschar, Subcutaneous,] [4:N/[name]] Tissue Debrided: [2:N/[name]] [3:Slough Skin/Subcutaneous Tissue] [4:N/[name]] Level: [2:N/[name]] [3:10.2] [4:N/[name]] Debridement [name] (sq cm): [2:rea N/[name]] [3:Forceps, Scissors] [4:N/[name]] Instrument: [2:N/[name]] [3:Minimum] [4:N/[name]] Bleeding: [2:N/[name]] [3:Pressure] [4:N/[name]] Hemostasis Achieved: [2:N/[name]] [3:0] [4:N/[name]] Procedural Pain: [2:N/[name]] [3:0] [4:N/[name]] Post Procedural Pain: [2:N/[name]] [3:Procedure was tolerated well] [4:N/[name]] Debridement Treatment Response: [2:N/[name]] [3:3x3.4x0.9] [4:N/[name]] Post Debridement Measurements L x W x D (cm) [2:N/[name]] [3:7.21] [4:N/[name]] Post Debridement Volume: (cm) [2:N/[name]] [3:Category/Stage IV] [4:N/[name]] Post Debridement Stage: [2:N/[name]] [3:Debridement] [4:N/[name]] Wound Number: 5 N/[name] N/[name] Photos: No Photos N/[name] N/[name] Left Gluteal fold N/[name] N/[name] Wound Location: Pressure Injury N/[name] N/[name] Wounding Event: Pressure Ulcer N/[name] N/[name] Primary Etiology: Paraplegia N/[name] N/[name] Comorbid History: 08/06/2020 N/[name] N/[name] Date Acquired: 2 N/[name] N/[name] Weeks of Treatment: Open N/[name] N/[name] Wound Status: 1.7x1x0.1 N/[name] N/[name] Measurements L x W x D (cm) 1.335 N/[name] N/[name] [name] (cm) : rea 0.134 N/[name] N/[name] Volume (cm) : 54.70% N/[name] N/[name] % Reduction in [name] rea: 54.60% N/[name] N/[name] % Reduction in Volume: No N/[name] N/[name] Undermining: Category/Stage III N/[name] N/[name] Classification: Small N/[name] N/[name] Exudate [name] mount: Serosanguineous N/[name] N/[name] Exudate Type: red, brown N/[name] N/[name] Exudate Color: No N/[name] N/[name] Foul Odor [name] Cleansing: fter N/[name] N/[name] N/[name] Odor [name] nticipated Due to Product Use: Flat and Intact N/[name] N/[name] Wound Margin: Small (1-33%) N/[name] N/[name] Granulation [name] mount: Red, Pink N/[name] N/[name] Granulation Quality: Large (67-100%) N/[name] N/[name] Necrotic [name]  mount: Fat Layer (Subcutaneous Tissue): Yes N/[name] N/[name] Exposed Structures: Fascia: No Tendon: No Muscle: No Joint: No Bone: No Small (1-33%) N/[name] N/[name] Epithelialization: Chemical/Enzymatic/Mechanical N/[name] N/[name] Debridement: N/[name] N/[name] N/[name] Pain Control: N/[name] N/[name] N/[name] Tissue Debrided: N/[name] N/[name] N/[name] Level: N/[name] N/[name] N/[name] Debridement [name] (sq cm): rea N/[name] N/[name] N/[name] Instrument: None N/[name] N/[name] Bleeding: N/[name] N/[name] N/[name] Hemostasis Achieved: N/[name] N/[name] N/[name] Procedural Pain: N/[name] N/[name] N/[name] Post Procedural Pain: Procedure was tolerated well N/[name] N/[name] Debridement Treatment Response: 1.7x1x0.1 N/[name] N/[name] Post Debridement Measurements L x W x D (cm) 0.134 N/[name] N/[name] Post Debridement Volume: (cm) Category/Stage III N/[name] N/[name] Post Debridement Stage: N/[name] N/[name] N/[name] Procedures Performed: Treatment Notes Wound #2 (Right Achilles) 1. Cleanse With Wound Cleanser 3. Primary Dressing Applied Iodoflex 4. Secondary Dressing Dry Gauze Roll Gauze Foam Heel Cup 5. Secured With Tape Wound #3 (Left, Lateral Malleolus) 1. Cleanse With Wound Cleanser 3. Primary Dressing Applied Iodoflex 4. Secondary Dressing Dry Gauze Roll Gauze Foam Heel Cup 5. Secured With Tape Wound #4 (Sacrum) 1. Cleanse With Wound Cleanser 3. Primary Dressing Applied  [employer] 4. Secondary Dressing ABD Pad Dry Gauze 5. Secured With Tape Notes saline moistened gauze Wound #5 (Left Gluteal fold) 1. Cleanse With Wound Cleanser 3. Primary Dressing Applied [employer] Other primary dressing (specifiy in notes) 4. Secondary Dressing Foam Border Dressing Notes wet to dry backing Electronic Signature(s) Signed: 08/27/2020 4:10:39 PM By: Cherylin Mylar Signed: 08/27/2020 4:24:19 PM By: Baltazar Najjar MD Signed: 08/27/2020 4:24:19 PM By: Baltazar Najjar MD Entered By: Baltazar Najjar on 08/27/2020 15:56:57 -------------------------------------------------------------------------------- Multi-Disciplinary Care Plan Details Patient  Name: Date of Service: [name], [name] NIO L. 08/27/2020 2:30 PM Medical Record Number: [id number] Patient Account Number: [id number] Date of Birth/Sex: Treating RN: [birth year][name] (29 y.o. [name] Primary Care Channel Papandrea: [name] Other Clinician: Referring Persephonie Hegwood: Treating Leighla Chestnutt/Extender: [name] in Treatment: 4 Active Inactive Pressure Nursing Diagnoses: Knowledge deficit related to causes and risk factors for pressure ulcer development Goals: Patient/caregiver will verbalize risk factors for pressure ulcer development Date Initiated: 07/30/2020 Target Resolution Date: 09/27/2020 Goal Status: Active Interventions: Provide education on pressure ulcers Notes: Wound/Skin Impairment Nursing Diagnoses: Impaired tissue integrity Goals: Ulcer/skin breakdown will have [name] volume reduction of 50% by week 8 Date Initiated: 07/30/2020 Target Resolution Date: 09/27/2020 Goal Status: Active Interventions: Provide education on ulcer and skin care Notes: Electronic Signature(s) Signed: 08/27/2020 4:10:39 PM By: Cherylin Mylar Entered By: Cherylin Mylar on 08/27/2020 15:02:00 -------------------------------------------------------------------------------- Pain Assessment Details Patient Name: Date of Service: [name], [name] 08/27/2020 2:30 PM Medical Record Number: [id number] Patient Account Number: [id number] Date of Birth/Sex: Treating RN: [birth year] (29 y.o. [name] Primary Care Gredmarie Delange: [name] Other Clinician: Referring Belvia Gotschall: Treating Deatrice Spanbauer/Extender: [name] in Treatment: 4 Active Problems Location of Pain Severity and Description of Pain Patient Has Paino No Site Locations Rate the pain. Current Pain Level: 0 Pain Management and Medication Current Pain Management: Electronic Signature(s) Signed: 08/27/2020 4:31:44 PM By: Zenaida Deed RN, BSN Entered By:  Zenaida Deed on 08/27/2020 15:03:55 -------------------------------------------------------------------------------- Patient/Caregiver Education Details Patient Name: Date of Service: [name] 10/1/2021andnbsp2:30 PM Medical Record Number: [id number] Patient Account Number: [id number] Date of Birth/Gender: Treating RN: [name] 17, [name] (29 y.o. [name] Primary Care Physician: [name] Other Clinician: Referring Physician: Treating Physician/Extender: [name] in Treatment: 4 Education Assessment Education Provided To: Patient Education Topics Provided Pressure: Handouts: Pressure Ulcers: Care and Offloading Methods: Explain/Verbal Responses: State content correctly Wound/Skin Impairment: Handouts: Caring for Your Ulcer Methods: Explain/Verbal Responses: State content correctly Electronic Signature(s) Signed: 08/27/2020 4:10:39 PM By: Cherylin Mylar Entered By: Cherylin Mylar on 08/27/2020 15:03:02 -------------------------------------------------------------------------------- Wound Assessment Details Patient Name: Date of Service: [name], [name] NIO L. 08/27/2020 2:30 PM Medical Record Number: [id number] Patient Account Number: [id number] Date of Birth/Sex: Treating RN: [birth year][name] (29 y.o. [name] Primary Care Rawlins Stuard: [name] Other Clinician: Referring Chance Munter: Treating Keyerra Lamere/Extender: [name] in Treatment: 4 Wound Status Wound Number: 2 Primary Etiology: Pressure Ulcer Wound Location: Right Achilles Wound Status: Open Wounding Event: Gradually Appeared Comorbid History: Paraplegia Date Acquired: 06/27/2020 Weeks Of Treatment: 4 Clustered Wound: No Wound Measurements Length: (cm) 0.9 Width: (cm) 1.2 Depth: (cm) 0.2 Area: (cm) 0.848 Volume: (cm) 0.17 % Reduction in Area: 20% % Reduction in Volume: -60.4% Epithelialization: None Tunneling:  No Undermining: No Wound Description Classification: Category/Stage III Wound Margin: Flat and Intact Exudate Amount: Medium Exudate Type: Serosanguineous Exudate Color: red, brown Foul Odor After Cleansing: No Slough/Fibrino Yes Wound Bed Granulation Amount:  Small (1-33%) Exposed Structure Granulation Quality: Red Fascia Exposed: No Necrotic Amount: Large (67-100%) Fat Layer (Subcutaneous Tissue) Exposed: Yes Necrotic Quality: Adherent Slough Tendon Exposed: No Muscle Exposed: No Joint Exposed: No Bone Exposed: No Treatment Notes Wound #2 (Right Achilles) 1. Cleanse With Wound Cleanser 3. Primary Dressing Applied Iodoflex 4. Secondary Dressing Dry Gauze Roll Gauze Foam Heel Cup 5. Secured With Secretary/administratorTape Electronic Signature(s) Signed: 08/27/2020 4:31:44 PM By: Zenaida DeedBoehlein, Linda RN, BSN Entered By: Zenaida DeedBoehlein, Linda on 08/27/2020 15:05:10 -------------------------------------------------------------------------------- Wound Assessment Details Patient Name: Date of Service: [name] RegulusRUSSELL, [name] 08/27/2020 2:30 PM Medical Record Number: [id number] Patient Account Number: [id number] Date of Birth/Sex: Treating RN: [birth year][name] (29 y.o. [name] SchoonerM) Boehlein, Linda Primary Care Jamont Mellin: [name] IpStroud, [name] Other Clinician: Referring Kasarah Sitts: Treating Miloh Alcocer/Extender: [name] Heckleobson, Michael [name], [name] Weeks in Treatment: 4 Wound Status Wound Number: 3 Primary Etiology: Pressure Ulcer Wound Location: Left, Lateral Malleolus Wound Status: Open Wounding Event: Gradually Appeared Comorbid History: Paraplegia Date Acquired: 06/27/2020 Weeks Of Treatment: 4 Clustered Wound: No Wound Measurements Length: (cm) 3 Width: (cm) 3.4 Depth: (cm) 0.9 Area: (cm) 8.011 Volume: (cm) 7.21 % Reduction in Area: -121.7% % Reduction in Volume: -232.6% Epithelialization: None Tunneling: No Undermining: Yes Starting Position (o'clock): 6 Ending Position (o'clock): 1 Maximum Distance: (cm)  0.8 Wound Description Classification: Category/Stage IV Wound Margin: Epibole Exudate Amount: Medium Exudate Type: Purulent Exudate Color: yellow, brown, green Foul Odor After Cleansing: Yes Due to Product Use: No Slough/Fibrino Yes Wound Bed Granulation Amount: None Present (0%) Exposed Structure Necrotic Amount: Large (67-100%) Fascia Exposed: No Necrotic Quality: Adherent Slough Fat Layer (Subcutaneous Tissue) Exposed: Yes Tendon Exposed: Yes Muscle Exposed: No Joint Exposed: No Bone Exposed: Yes Treatment Notes Wound #3 (Left, Lateral Malleolus) 1. Cleanse With Wound Cleanser 3. Primary Dressing Applied Iodoflex 4. Secondary Dressing Dry Gauze Roll Gauze Foam Heel Cup 5. Secured With Secretary/administratorTape Electronic Signature(s) Signed: 08/27/2020 4:31:44 PM By: Zenaida DeedBoehlein, Linda RN, BSN Entered By: Zenaida DeedBoehlein, Linda on 08/27/2020 15:05:51 -------------------------------------------------------------------------------- Wound Assessment Details Patient Name: Date of Service: [name] RegulusRUSSELL, [name] 08/27/2020 2:30 PM Medical Record Number: [id number] Patient Account Number: [id number] Date of Birth/Sex: Treating RN: [birth year][name] (29 y.o. [name] SchoonerM) Boehlein, Linda Primary Care Jessiah Steinhart: [name] IpStroud, [name] Other Clinician: Referring Elya Tarquinio: Treating Luwana Butrick/Extender: [name] Heckleobson, Michael [name], [name] Weeks in Treatment: 4 Wound Status Wound Number: 4 Primary Etiology: Pressure Ulcer Wound Location: Sacrum Wound Status: Open Wounding Event: Gradually Appeared Comorbid History: Paraplegia Date Acquired: 05/27/2020 Weeks Of Treatment: 4 Clustered Wound: No Wound Measurements Length: (cm) 7.1 Width: (cm) 4.2 Depth: (cm) 1.6 Area: (cm) 23.421 Volume: (cm) 37.473 % Reduction in Area: -65.7% % Reduction in Volume: -39.5% Epithelialization: None Tunneling: No Undermining: No Wound Description Classification: Category/Stage IV Wound Margin: Well defined, not attached Exudate Amount:  Medium Exudate Type: Serosanguineous Exudate Color: red, brown Foul Odor After Cleansing: No Slough/Fibrino Yes Wound Bed Granulation Amount: Large (67-100%) Exposed Structure Granulation Quality: Pink, Pale Fascia Exposed: No Necrotic Amount: Small (1-33%) Fat Layer (Subcutaneous Tissue) Exposed: Yes Necrotic Quality: Adherent Slough Tendon Exposed: No Muscle Exposed: No Joint Exposed: No Bone Exposed: Yes Treatment Notes Wound #4 (Sacrum) 1. Cleanse With Wound Cleanser 3. Primary Dressing Applied [employer] 4. Secondary Dressing ABD Pad Dry Gauze 5. Secured With Tape Notes saline moistened gauze Electronic Signature(s) Signed: 08/27/2020 4:31:44 PM By: Zenaida DeedBoehlein, Linda RN, BSN Entered By: Zenaida DeedBoehlein, Linda on 08/27/2020 15:06:21 -------------------------------------------------------------------------------- Wound Assessment Details Patient Name: Date of Service: [name], [name] 08/27/2020 2:30 PM Medical Record Number: [id number] Patient Account Number:  [id number] Date of Birth/Sex: Treating RN: [birth year][name] (29 y.o. [name] Primary Care Harriett Azar: [name] Other Clinician: Referring Coraleigh Sheeran: Treating Merlie Noga/Extender: [name] in Treatment: 4 Wound Status Wound Number: 5 Primary Etiology: Pressure Ulcer Wound Location: Left Gluteal fold Wound Status: Open Wounding Event: Pressure Injury Comorbid History: Paraplegia Date Acquired: 08/06/2020 Weeks Of Treatment: 2 Clustered Wound: No Wound Measurements Length: (cm) 1.7 Width: (cm) 1 Depth: (cm) 0.1 Area: (cm) 1.335 Volume: (cm) 0.134 % Reduction in Area: 54.7% % Reduction in Volume: 54.6% Epithelialization: Small (1-33%) Tunneling: No Undermining: No Wound Description Classification: Category/Stage III Wound Margin: Flat and Intact Exudate Amount: Small Exudate Type: Serosanguineous Exudate Color: red, brown Foul Odor After Cleansing:  No Slough/Fibrino Yes Wound Bed Granulation Amount: Small (1-33%) Exposed Structure Granulation Quality: Red, Pink Fascia Exposed: No Necrotic Amount: Large (67-100%) Fat Layer (Subcutaneous Tissue) Exposed: Yes Necrotic Quality: Adherent Slough Tendon Exposed: No Muscle Exposed: No Joint Exposed: No Bone Exposed: No Treatment Notes Wound #5 (Left Gluteal fold) 1. Cleanse With Wound Cleanser 3. Primary Dressing Applied [employer] Other primary dressing (specifiy in notes) 4. Secondary Dressing Foam Border Dressing Notes wet to dry backing Electronic Signature(s) Signed: 08/27/2020 4:31:44 PM By: Zenaida Deed RN, BSN Entered By: Zenaida Deed on 08/27/2020 15:06:39 -------------------------------------------------------------------------------- Vitals Details Patient Name: Date of Service: [name] March L. 08/27/2020 2:30 PM Medical Record Number: [id number] Patient Account Number: [id number] Date of Birth/Sex: Treating RN: [birth year][name] (29 y.o. [name] Primary Care Tamma Brigandi: [name] Other Clinician: Referring [name] Alers: Treating Kateryn Marasigan/Extender: [name] in Treatment: 4 Vital Signs Time Taken: 14:38 Temperature (F): 98.2 Height (in): 71 Pulse (bpm): 123 Source: Stated Respiratory Rate (breaths/min): 18 Weight (lbs): 136 Blood Pressure (mmHg): 129/79 Source: Stated Reference Range: 80 - 120 mg / dl Body Mass Index (BMI): 19 Electronic Signature(s) Signed: 08/27/2020 4:31:44 PM By: Zenaida Deed RN, BSN Entered By: Zenaida Deed on 08/27/2020 14:38:30

## 2020-08-31 ENCOUNTER — Ambulatory Visit (HOSPITAL_COMMUNITY): Payer: Medicaid Other

## 2020-09-03 ENCOUNTER — Telehealth: Payer: Self-pay

## 2020-09-03 NOTE — Telephone Encounter (Signed)
I attempted to call pt x2- it said he was not accepting calls at this time- unable to leave a message.

## 2020-09-03 NOTE — Telephone Encounter (Signed)
Patient called stating the his whole mid section is cramped up, tight, a lot of pain and nothing if working to relieve the pain.

## 2020-09-07 ENCOUNTER — Ambulatory Visit (HOSPITAL_COMMUNITY)
Admission: RE | Admit: 2020-09-07 | Discharge: 2020-09-07 | Disposition: A | Discharge status: Home / Self Care | Payer: Medicaid Other | Source: Ambulatory Visit | Attending: Internal Medicine | Admitting: Internal Medicine

## 2020-09-07 ENCOUNTER — Encounter (HOSPITAL_COMMUNITY): Payer: Self-pay

## 2020-09-07 ENCOUNTER — Other Ambulatory Visit: Payer: Self-pay

## 2020-09-07 DIAGNOSIS — L89154 Pressure ulcer of sacral region, stage 4: Secondary | ICD-10-CM | POA: Insufficient documentation

## 2020-09-07 MED ORDER — IOHEXOL 300 MG/ML  SOLN
100.0000 mL | Freq: Once | INTRAMUSCULAR | Status: AC | PRN
  Administered 2020-09-07: 100 mL via INTRAVENOUS

## 2020-09-10 ENCOUNTER — Encounter (HOSPITAL_BASED_OUTPATIENT_CLINIC_OR_DEPARTMENT_OTHER): Payer: Medicaid Other | Admitting: Internal Medicine

## 2020-09-10 ENCOUNTER — Other Ambulatory Visit (HOSPITAL_COMMUNITY)
Admission: RE | Admit: 2020-09-10 | Discharge: 2020-09-10 | Disposition: A | Discharge status: Home / Self Care | Payer: Medicaid Other | Source: Other Acute Inpatient Hospital | Attending: Internal Medicine | Admitting: Internal Medicine

## 2020-09-10 DIAGNOSIS — L89154 Pressure ulcer of sacral region, stage 4: Secondary | ICD-10-CM | POA: Diagnosis not present

## 2020-09-10 DIAGNOSIS — M009 Pyogenic arthritis, unspecified: Secondary | ICD-10-CM | POA: Insufficient documentation
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
## 2020-09-13 LAB — AEROBIC CULTURE W GRAM STAIN (SUPERFICIAL SPECIMEN)
Gram Stain: NONE SEEN
Special Requests: NORMAL

## 2020-09-13 NOTE — Progress Notes (Signed)
[name] ([id number]) Visit Report for 09/10/2020 Arrival Information Details Patient Name: Date of Service: [name], [name] 09/10/2020 2:30 PM Medical Record Number: [id number] Patient Account Number: [id number] Date of Birth/Sex: Treating RN: [birth year] (29 y.o. [name] Primary Care [name]: [name] Other Clinician: Referring [name]: Treating [name]/Extender: [name] in Treatment: 6 Visit Information History Since Last Visit Added or deleted any medications: No Patient Arrived: Wheel Chair Any new allergies or adverse reactions: No Arrival Time: 14:54 Had [name] fall or experienced change in No Accompanied By: self activities of daily living that may affect Transfer Assistance: Manual risk of falls: Patient Identification Verified: Yes Signs or symptoms of abuse/neglect since last visito No Secondary Verification Process Completed: Yes Hospitalized since last visit: No Patient Requires Transmission-Based Precautions: No Implantable device outside of the clinic excluding No Patient Has Alerts: No cellular tissue based products placed in the center since last visit: Has Dressing in Place as Prescribed: Yes Pain Present Now: No Electronic Signature(s) Signed: 09/13/2020 9:39:47 AM By: [name] Ito Entered By: [name] Ito on 09/10/2020 14:54:22 -------------------------------------------------------------------------------- Clinic Level of Care Assessment Details Patient Name: Date of Service: [name], [name] 09/10/2020 2:30 PM Medical Record Number: [id number] Patient Account Number: [id number] Date of Birth/Sex: Treating RN: [birth year] (29 y.o. [name] Primary Care [name]: [name] Other Clinician: Referring Guerino Caporale: Treating Yuki Purves/Extender: [name] in Treatment: 6 Clinic Level of Care Assessment Items TOOL 4 Quantity Score []  - 0 Use when  only an EandM is performed on FOLLOW-UP visit ASSESSMENTS - Nursing Assessment / Reassessment X- 1 10 Reassessment of Co-morbidities (includes updates in patient status) X- 1 5 Reassessment of Adherence to Treatment Plan ASSESSMENTS - Wound and Skin [name] ssessment / Reassessment []  - 0 Simple Wound Assessment / Reassessment - one wound X- 4 5 Complex Wound Assessment / Reassessment - multiple wounds []  - 0 Dermatologic / Skin Assessment (not related to wound area) ASSESSMENTS - Focused Assessment []  - 0 Circumferential Edema Measurements - multi extremities []  - 0 Nutritional Assessment / Counseling / Intervention []  - 0 Lower Extremity Assessment (monofilament, tuning fork, pulses) []  - 0 Peripheral Arterial Disease Assessment (using hand held doppler) ASSESSMENTS - Ostomy and/or Continence Assessment and Care []  - 0 Incontinence Assessment and Management []  - 0 Ostomy Care Assessment and Management (repouching, etc.) PROCESS - Coordination of Care X - Simple Patient / Family Education for ongoing care 1 15 []  - 0 Complex (extensive) Patient / Family Education for ongoing care X- 1 10 Staff obtains , Records, T Results / Process Orders est []  - 0 Staff telephones HHA, Nursing Homes / Clarify orders / etc []  - 0 Routine Transfer to another Facility (non-emergent condition) X- 1 10 Routine Hospital Admission (non-emergent condition) []  - 0 New Admissions / / Ordering NPWT Apligraf, etc. , []  - 0 Emergency Hospital Admission (emergent condition) X- 1 10 Simple Discharge Coordination []  - 0 Complex (extensive) Discharge Coordination PROCESS - Special Needs []  - 0 Pediatric / Minor Patient Management []  - 0 Isolation Patient Management []  - 0 Hearing / Language / Visual special needs []  - 0 Assessment of Community assistance (transportation, D/C planning, etc.) []  - 0 Additional assistance / Altered mentation []  - 0 Support  Surface(s) Assessment (bed, cushion, seat, etc.) INTERVENTIONS - Wound Cleansing / Measurement []  - 0 Simple Wound Cleansing - one wound X- 4 5 Complex Wound Cleansing - multiple wounds X-  1 5 Wound Imaging (photographs - any number of wounds) []  - 0 Wound Tracing (instead of photographs) []  - 0 Simple Wound Measurement - one wound X- 4 5 Complex Wound Measurement - multiple wounds INTERVENTIONS - Wound Dressings X - Small Wound Dressing one or multiple wounds 4 10 []  - 0 Medium Wound Dressing one or multiple wounds []  - 0 Large Wound Dressing one or multiple wounds X- 1 5 Application of Medications - topical []  - 0 Application of Medications - injection INTERVENTIONS - Miscellaneous []  - 0 External ear exam []  - 0 Specimen Collection (cultures, biopsies, blood, body fluids, etc.) []  - 0 Specimen(s) / Culture(s) sent or taken to Lab for analysis []  - 0 Patient Transfer (multiple staff / Nurse, adultHoyer Lift / Similar devices) []  - 0 Simple Staple / Suture removal (25 or less) []  - 0 Complex Staple / Suture removal (26 or more) []  - 0 Hypo / Hyperglycemic Management (close monitor of Blood Glucose) []  - 0 Ankle / Brachial Index (ABI) - do not check if billed separately X- 1 5 Vital Signs Has the patient been seen at the hospital within the last three years: Yes Total Score: 175 Level Of Care: New/Established - Level 5 Electronic Signature(s) Signed: 09/10/2020 6:20:53 PM By: [name], Linda RN, BSN Entered By: [name], Linda on 09/10/2020 16:08:46 -------------------------------------------------------------------------------- Encounter Discharge Information Details Patient Name: Date of Service: [name], [name] 09/10/2020 2:30 PM Medical Record Number: [id number] Patient Account Number: [id number] Date of Birth/Sex: Treating RN: 04/14/1991 (29 y.o. [name]) [name], [name] Primary Care [name]: [name] Other Clinician: Referring Shenia Alan: Treating  Corine Solorio/Extender: [name] Heckleobson, Michael [name], [name] Weeks in Treatment: 6 Encounter Discharge Information Items Discharge Condition: Stable Ambulatory Status: Wheelchair Discharge Destination: Home Transportation: Private Auto Accompanied By: alone Schedule Follow-up Appointment: Yes Clinical Summary of Care: Patient Declined Electronic Signature(s) Signed: 09/10/2020 5:57:53 PM By: Zandra AbtsLynch, Shatara RN, BSN Entered By: Zandra AbtsLynch, [name] on 09/10/2020 17:47:50 -------------------------------------------------------------------------------- Multi Wound Chart Details Patient Name: Date of Service: [name], [name] 09/10/2020 2:30 PM Medical Record Number: [id number] Patient Account Number: [id number] Date of Birth/Sex: Treating RN: 01/17/1991 (29 y.o. [name] SchoonerM) Boehlein, Linda Primary Care Judd Mccubbin: [name] Other Clinician: Referring Johnesha Acheampong: Treating Modesto Ganoe/Extender: [name] Heckleobson, Michael [name], [name] Weeks in Treatment: 6 Vital Signs Height(in): 71 Pulse(bpm): 96 Weight(lbs): 136 Blood Pressure(mmHg): 117/90 Body Mass Index(BMI): 19 Temperature(F): 98.3 Respiratory Rate(breaths/min): 18 Photos: [2:No Photos Right Achilles] [3:No Photos Left, Lateral Malleolus] [4:No Photos Sacrum] Wound Location: [2:Gradually Appeared] [3:Gradually Appeared] [4:Gradually Appeared] Wounding Event: [2:Pressure Ulcer] [3:Pressure Ulcer] [4:Pressure Ulcer] Primary Etiology: [2:Paraplegia] [3:Paraplegia] [4:Paraplegia] Comorbid History: [2:06/27/2020] [3:06/27/2020] [4:05/27/2020] Date Acquired: [2:6] [3:6] [4:6] Weeks of Treatment: [2:Open] [3:Open] [4:Open] Wound Status: [2:0.7x1x0.4] [3:2.7x3x1.3] [4:6.7x3.5x1.6] Measurements L x W x D (cm) [2:0.55] [3:6.362] [4:18.418] [name] (cm) : rea [2:0.22] [3:8.27] [4:29.468] Volume (cm) : [2:48.10%] [3:-76.10%] [4:-30.30%] % Reduction in [name] rea: [2:-107.50%] [3:-281.50%] [4:-9.70%] % Reduction in Volume: [2:Category/Stage III] [3:Category/Stage IV]  [4:Category/Stage IV] Classification: [2:Medium] [3:Medium] [4:Medium] Exudate [name] mount: [2:Serosanguineous] [3:Serosanguineous] [4:Serosanguineous] Exudate Type: [2:red, brown] [3:red, brown] [4:red, brown] Exudate Color: [2:No] [3:Yes] [4:No] Foul Odor [name] Cleansing: [2:fter N/[name]] [3:No] [4:N/[name]] Odor [name] nticipated Due to Product Use: [2:Flat and Intact] [3:Epibole] [4:Well defined, not attached] Wound Margin: [2:Small (1-33%)] [3:None Present (0%)] [4:Large (67-100%)] Granulation [name] mount: [2:Red] [3:N/[name]] [4:Pink, Pale] Granulation Quality: [2:Large (67-100%)] [3:Large (67-100%)] [4:Small (1-33%)] Necrotic [name] mount: [2:Fat Layer (Subcutaneous Tissue): Yes Fat Layer (Subcutaneous Tissue): Yes Fat Layer (Subcutaneous Tissue): Yes] Exposed Structures: [2:Fascia: No Tendon: No  Muscle: No Joint: No Bone: No None] [3:Tendon: Yes Bone: Yes Fascia: No Muscle: No Joint: No None] [4:Bone: Yes Fascia: No Tendon: No Muscle: No Joint: No None] Wound Number: 5 N/[name] N/[name] Photos: No Photos N/[name] N/[name] Left Gluteal fold N/[name] N/[name] Wound Location: Pressure Injury N/[name] N/[name] Wounding Event: Pressure Ulcer N/[name] N/[name] Primary Etiology: Paraplegia N/[name] N/[name] Comorbid History: 08/06/2020 N/[name] N/[name] Date Acquired: 4 N/[name] N/[name] Weeks of Treatment: Open N/[name] N/[name] Wound Status: 0.6x0.8x0.1 N/[name] N/[name] Measurements L x W x D (cm) 0.377 N/[name] N/[name] [name] (cm) : rea 0.038 N/[name] N/[name] Volume (cm) : 87.20% N/[name] N/[name] % Reduction in [name] rea: 87.10% N/[name] N/[name] % Reduction in Volume: Category/Stage III N/[name] N/[name] Classification: Small N/[name] N/[name] Exudate [name] mount: Serosanguineous N/[name] N/[name] Exudate Type: red, brown N/[name] N/[name] Exudate Color: No N/[name] N/[name] Foul Odor [name] Cleansing: fter N/[name] N/[name] N/[name] Odor [name] nticipated Due to Product Use: Flat and Intact N/[name] N/[name] Wound Margin: Small (1-33%) N/[name] N/[name] Granulation [name] mount: Red, Pink N/[name] N/[name] Granulation Quality: Large (67-100%) N/[name] N/[name] Necrotic [name] mount: Fat Layer (Subcutaneous Tissue): Yes N/[name] N/[name] Exposed  Structures: Fascia: No Tendon: No Muscle: No Joint: No Bone: No Small (1-33%) N/[name] N/[name] Epithelialization: Treatment Notes Electronic Signature(s) Signed: 09/10/2020 6:16:54 PM By: Baltazar Najjar MD Signed: 09/10/2020 6:20:53 PM By: [name] Deed RN, BSN Entered By: Baltazar Najjar on 09/10/2020 15:59:40 -------------------------------------------------------------------------------- Multi-Disciplinary Care Plan Details Patient Name: Date of Service: [name], [name] NIO L. 09/10/2020 2:30 PM Medical Record Number: [id number] Patient Account Number: [id number] Date of Birth/Sex: Treating RN: 1991-03-30 (29 y.o. [name] Primary Care Marnisha Stampley: [name] Other Clinician: Referring Geraldine Sandberg: Treating Jazzie Trampe/Extender: [name] in Treatment: 6 Active Inactive Pressure Nursing Diagnoses: Knowledge deficit related to causes and risk factors for pressure ulcer development Goals: Patient/caregiver will verbalize risk factors for pressure ulcer development Date Initiated: 07/30/2020 Target Resolution Date: 09/27/2020 Goal Status: Active Interventions: Provide education on pressure ulcers Notes: Wound/Skin Impairment Nursing Diagnoses: Impaired tissue integrity Goals: Ulcer/skin breakdown will have [name] volume reduction of 50% by week 8 Date Initiated: 07/30/2020 Target Resolution Date: 09/27/2020 Goal Status: Active Interventions: Provide education on ulcer and skin care Notes: Electronic Signature(s) Signed: 09/10/2020 6:20:53 PM By: [name] Deed RN, BSN Entered By: [name] Deed on 09/10/2020 15:47:31 -------------------------------------------------------------------------------- Pain Assessment Details Patient Name: Date of Service: [name] March L. 09/10/2020 2:30 PM Medical Record Number: [id number] Patient Account Number: [id number] Date of Birth/Sex: Treating RN: [birth year] (29 y.o. [name] Primary Care  Davan Hark: [name] Other Clinician: Referring Demond Shallenberger: Treating Roni Friberg/Extender: [name] in Treatment: 6 Active Problems Location of Pain Severity and Description of Pain Patient Has Paino No Site Locations Pain Management and Medication Current Pain Management: Electronic Signature(s) Signed: 09/10/2020 6:20:53 PM By: [name] Deed RN, BSN Signed: 09/13/2020 9:39:47 AM By: [name] Ito Entered By: [name] Ito on 09/10/2020 14:54:39 -------------------------------------------------------------------------------- Patient/Caregiver Education Details Patient Name: Date of Service: [name] 10/15/2021andnbsp2:30 PM Medical Record Number: [id number] Patient Account Number: [id number] Date of Birth/Gender: Treating RN: 07/25/1991 (29 y.o. [name] Primary Care Physician: [name] Other Clinician: Referring Physician: Treating Physician/Extender: [name] in Treatment: 6 Education Assessment Education Provided To: Patient Education Topics Provided Pressure: Methods: Explain/Verbal Responses: Reinforcements needed, State content correctly Wound/Skin Impairment: Methods: Explain/Verbal Responses: Reinforcements needed, State content correctly Electronic Signature(s) Signed: 09/10/2020 6:20:53 PM By: [name] Deed RN, BSN Entered By: [name] Deed on 09/10/2020 15:50:03 -------------------------------------------------------------------------------- Wound  Assessment Details Patient Name: Date of Service: [name], [name] 09/10/2020 2:30 PM Medical Record Number: [id number] Patient Account Number: [id number] Date of Birth/Sex: Treating RN: [birth year] (29 y.o. [name] Primary Care Malayla Granberry: [name] Other Clinician: Referring Nguyen Todorov: Treating Delane Stalling/Extender: [name] in Treatment: 6 Wound Status Wound  Number: 2 Primary Etiology: Pressure Ulcer Wound Location: Right Achilles Wound Status: Open Wounding Event: Gradually Appeared Comorbid History: Paraplegia Date Acquired: 06/27/2020 Weeks Of Treatment: 6 Clustered Wound: No Wound Measurements Length: (cm) 0.7 Width: (cm) 1 Depth: (cm) 0.4 Area: (cm) 0.55 Volume: (cm) 0.22 % Reduction in Area: 48.1% % Reduction in Volume: -107.5% Epithelialization: None Tunneling: No Wound Description Classification: Category/Stage III Wound Margin: Flat and Intact Exudate Amount: Medium Exudate Type: Serosanguineous Exudate Color: red, brown Foul Odor After Cleansing: No Slough/Fibrino Yes Wound Bed Granulation Amount: Small (1-33%) Exposed Structure Granulation Quality: Red Fascia Exposed: No Necrotic Amount: Large (67-100%) Fat Layer (Subcutaneous Tissue) Exposed: Yes Necrotic Quality: Adherent Slough Tendon Exposed: No Muscle Exposed: No Joint Exposed: No Bone Exposed: No Treatment Notes Wound #2 (Right Achilles) 1. Cleanse With Wound Cleanser 3. Primary Dressing Applied Collegen AG 4. Secondary Dressing Dry Gauze Roll Gauze Foam Heel Cup 5. Secured With Secretary/administrator) Signed: 09/10/2020 6:20:53 PM By: [name] Deed RN, BSN Signed: 09/13/2020 9:39:47 AM By: [name] Ito Entered By: [name] Ito on 09/10/2020 15:16:01 -------------------------------------------------------------------------------- Wound Assessment Details Patient Name: Date of Service: [name]. 09/10/2020 2:30 PM Medical Record Number: [id number] Patient Account Number: [id number] Date of Birth/Sex: Treating RN: 01-02-1991 (29 y.o. [name] Primary Care Zalma Channing: [name] Other Clinician: Referring Kortney Schoenfelder: Treating Serge Main/Extender: [name] in Treatment: 6 Wound Status Wound Number: 3 Primary Etiology: Pressure Ulcer Wound Location: Left, Lateral  Malleolus Wound Status: Open Wounding Event: Gradually Appeared Comorbid History: Paraplegia Date Acquired: 06/27/2020 Weeks Of Treatment: 6 Clustered Wound: No Wound Measurements Length: (cm) 2.7 Width: (cm) 3 Depth: (cm) 1.3 Area: (cm) 6.362 Volume: (cm) 8.27 % Reduction in Area: -76.1% % Reduction in Volume: -281.5% Epithelialization: None Tunneling: No Undermining: No Wound Description Classification: Category/Stage IV Wound Margin: Epibole Exudate Amount: Medium Exudate Type: Serosanguineous Exudate Color: red, brown Foul Odor After Cleansing: Yes Due to Product Use: No Slough/Fibrino Yes Wound Bed Granulation Amount: None Present (0%) Exposed Structure Necrotic Amount: Large (67-100%) Fascia Exposed: No Necrotic Quality: Adherent Slough Fat Layer (Subcutaneous Tissue) Exposed: Yes Tendon Exposed: Yes Muscle Exposed: No Joint Exposed: No Bone Exposed: Yes Treatment Notes Wound #3 (Left, Lateral Malleolus) 1. Cleanse With Wound Cleanser 3. Primary Dressing Applied Calcium Alginate Ag 4. Secondary Dressing ABD Pad Dry Gauze Roll Gauze Foam Heel Cup 5. Secured With Secretary/administrator) Signed: 09/10/2020 6:06:03 PM By: Yevonne Pax RN Entered By: Yevonne Pax on 09/10/2020 15:17:22 -------------------------------------------------------------------------------- Wound Assessment Details Patient Name: Date of Service: [name], [name] 09/10/2020 2:30 PM Medical Record Number: [id number] Patient Account Number: [id number] Date of Birth/Sex: Treating RN: 08/11/1991 (29 y.o. Judie Petit) Yevonne Pax Primary Care Bemnet Trovato: [name] Other Clinician: Referring Iyanni Hepp: Treating Cache Bills/Extender: [name] in Treatment: 6 Wound Status Wound Number: 4 Primary Etiology: Pressure Ulcer Wound Location: Sacrum Wound Status: Open Wounding Event: Gradually Appeared Comorbid History: Paraplegia Date Acquired:  05/27/2020 Weeks Of Treatment: 6 Clustered Wound: No Wound Measurements Length: (cm) 6.7 Width: (cm) 3.5 Depth: (cm) 1.6 Area: (cm) 18.418 Volume: (cm) 29.468 % Reduction in Area: -30.3% % Reduction in Volume: -9.7% Epithelialization:  None Tunneling: No Undermining: No Wound Description Classification: Category/Stage IV Wound Margin: Well defined, not attached Exudate Amount: Medium Exudate Type: Serosanguineous Exudate Color: red, brown Foul Odor After Cleansing: No Slough/Fibrino Yes Wound Bed Granulation Amount: Large (67-100%) Exposed Structure Granulation Quality: Pink, Pale Fascia Exposed: No Necrotic Amount: Small (1-33%) Fat Layer (Subcutaneous Tissue) Exposed: Yes Necrotic Quality: Adherent Slough Tendon Exposed: No Muscle Exposed: No Joint Exposed: No Bone Exposed: Yes Treatment Notes Wound #4 (Sacrum) 1. Cleanse With Wound Cleanser 3. Primary Dressing Applied Collegen AG 4. Secondary Dressing ABD Pad Dry Gauze 5. Secured With Tape Notes saline moistened gauze over collagen Electronic Signature(s) Signed: 09/10/2020 6:06:03 PM By: Yevonne Pax RN Entered By: Yevonne Pax on 09/10/2020 15:17:31 -------------------------------------------------------------------------------- Wound Assessment Details Patient Name: Date of Service: [name], [name] 09/10/2020 2:30 PM Medical Record Number: [id number] Patient Account Number: [id number] Date of Birth/Sex: Treating RN: 1991/03/19 (29 y.o. Judie Petit) Yevonne Pax Primary Care Karrington Mccravy: [name] Other Clinician: Referring Ginnette Gates: Treating Deonne Rooks/Extender: [name] in Treatment: 6 Wound Status Wound Number: 5 Primary Etiology: Pressure Ulcer Wound Location: Left Gluteal fold Wound Status: Open Wounding Event: Pressure Injury Comorbid History: Paraplegia Date Acquired: 08/06/2020 Weeks Of Treatment: 4 Clustered Wound: No Wound Measurements Length: (cm)  0.6 Width: (cm) 0.8 Depth: (cm) 0.1 Area: (cm) 0.377 Volume: (cm) 0.038 % Reduction in Area: 87.2% % Reduction in Volume: 87.1% Epithelialization: Small (1-33%) Tunneling: No Undermining: No Wound Description Classification: Category/Stage III Wound Margin: Flat and Intact Exudate Amount: Small Exudate Type: Serosanguineous Exudate Color: red, brown Foul Odor After Cleansing: No Slough/Fibrino Yes Wound Bed Granulation Amount: Small (1-33%) Exposed Structure Granulation Quality: Red, Pink Fascia Exposed: No Necrotic Amount: Large (67-100%) Fat Layer (Subcutaneous Tissue) Exposed: Yes Necrotic Quality: Adherent Slough Tendon Exposed: No Muscle Exposed: No Joint Exposed: No Bone Exposed: No Treatment Notes Wound #5 (Left Gluteal fold) 1. Cleanse With Wound Cleanser 3. Primary Dressing Applied Collegen AG Other primary dressing (specifiy in notes) 4. Secondary Dressing Foam Border Dressing Notes wet to dry backing Electronic Signature(s) Signed: 09/10/2020 6:06:03 PM By: Yevonne Pax RN Entered By: Yevonne Pax on 09/10/2020 15:17:38 -------------------------------------------------------------------------------- Vitals Details Patient Name: Date of Service: [name] March L. 09/10/2020 2:30 PM Medical Record Number: [id number] Patient Account Number: [id number] Date of Birth/Sex: Treating RN: 12-24-1990 (29 y.o. [name] Primary Care Jeliyah Middlebrooks: [name] Other Clinician: Referring Syair Fricker: Treating Latrel Szymczak/Extender: [name] in Treatment: 6 Vital Signs Time Taken: 14:54 Temperature (F): 98.3 Height (in): 71 Pulse (bpm): 96 Weight (lbs): 136 Respiratory Rate (breaths/min): 18 Body Mass Index (BMI): 19 Blood Pressure (mmHg): 117/90 Reference Range: 80 - 120 mg / dl Electronic Signature(s) Signed: 09/13/2020 9:39:47 AM By: [name] Ito Entered By: [name] Ito on 09/10/2020 14:54:35

## 2020-09-20 ENCOUNTER — Telehealth: Payer: Self-pay

## 2020-09-20 MED ORDER — BACLOFEN 20 MG PO TABS
20.0000 mg | ORAL_TABLET | Freq: Three times a day (TID) | ORAL | 5 refills | Status: DC
Start: 2020-09-20 — End: 2020-09-21

## 2020-09-20 MED ORDER — OXYCODONE HCL 10 MG PO TABS
15.0000 mg | ORAL_TABLET | Freq: Four times a day (QID) | ORAL | 0 refills | Status: DC | PRN
Start: 2020-09-20 — End: 2020-10-28

## 2020-09-20 NOTE — Telephone Encounter (Signed)
Sent in baclofen 20 mg TID and Oxycodone 15 mg q6 hours #160

## 2020-09-21 ENCOUNTER — Other Ambulatory Visit: Payer: Self-pay | Admitting: *Deleted

## 2020-09-21 ENCOUNTER — Ambulatory Visit: Payer: Medicaid Other | Admitting: Plastic Surgery

## 2020-09-21 MED ORDER — BACLOFEN 20 MG PO TABS: 20.0000 mg | ORAL_TABLET | Freq: Three times a day (TID) | ORAL | 5 refills | Status: DC

## 2020-09-22 ENCOUNTER — Telehealth: Payer: Self-pay

## 2020-09-22 MED ORDER — BACLOFEN 20 MG PO TABS
30.0000 mg | ORAL_TABLET | Freq: Three times a day (TID) | ORAL | 5 refills | Status: DC
Start: 2020-09-22 — End: 2020-11-05

## 2020-09-22 NOTE — Telephone Encounter (Signed)
[name] called back to have the Baclofen 20 mg frequency to be changed.  Per patient due to his infection he is having more frequent spasms.  So a new Rx will need to be submitted, to the pharmacy. Patient stated he was out of the medication. And waiting on a new script.   Please advise or prescribe. Call back ph# [phone].

## 2020-09-22 NOTE — Telephone Encounter (Signed)
Notified of new order and new directions.

## 2020-09-22 NOTE — Telephone Encounter (Signed)
Sent in Baclofen 30 mg 3x/day x 3 days, then 40 mg 3x/day x 1 week- if still needs more, can increase to the MAX dose of 40 mg 4x/day- cannot take more than that. If needs more than that, will need to add a different medicine- will make him more constipated.

## 2020-09-24 ENCOUNTER — Encounter (HOSPITAL_BASED_OUTPATIENT_CLINIC_OR_DEPARTMENT_OTHER): Payer: Medicaid Other | Admitting: Internal Medicine

## 2020-09-29 ENCOUNTER — Ambulatory Visit: Payer: Medicaid Other | Admitting: Plastic Surgery

## 2020-10-01 ENCOUNTER — Encounter (HOSPITAL_BASED_OUTPATIENT_CLINIC_OR_DEPARTMENT_OTHER): Payer: No Typology Code available for payment source | Admitting: Internal Medicine

## 2020-10-06 ENCOUNTER — Ambulatory Visit: Payer: Self-pay | Admitting: Family Medicine

## 2020-10-11 ENCOUNTER — Telehealth: Payer: Self-pay | Admitting: Plastic Surgery

## 2020-10-11 NOTE — Telephone Encounter (Signed)
LVM to reschedule missed appt from 11/3.

## 2020-10-13 ENCOUNTER — Other Ambulatory Visit: Payer: Self-pay

## 2020-10-13 ENCOUNTER — Telehealth: Payer: Self-pay | Admitting: Internal Medicine

## 2020-10-13 ENCOUNTER — Inpatient Hospital Stay (HOSPITAL_COMMUNITY)
Admission: EM | Admit: 2020-10-13 | Discharge: 2020-10-16 | DRG: 463 | Disposition: A | Discharge status: Home / Self Care | Payer: Medicaid Other | Attending: Internal Medicine | Admitting: Internal Medicine

## 2020-10-13 ENCOUNTER — Encounter (HOSPITAL_COMMUNITY): Payer: Self-pay | Admitting: Internal Medicine

## 2020-10-13 DIAGNOSIS — S31000A Unspecified open wound of lower back and pelvis without penetration into retroperitoneum, initial encounter: Secondary | ICD-10-CM

## 2020-10-13 DIAGNOSIS — Z888 Allergy status to other drugs, medicaments and biological substances status: Secondary | ICD-10-CM

## 2020-10-13 DIAGNOSIS — N319 Neuromuscular dysfunction of bladder, unspecified: Secondary | ICD-10-CM | POA: Diagnosis present

## 2020-10-13 DIAGNOSIS — N529 Male erectile dysfunction, unspecified: Secondary | ICD-10-CM | POA: Diagnosis present

## 2020-10-13 DIAGNOSIS — E43 Unspecified severe protein-calorie malnutrition: Secondary | ICD-10-CM | POA: Diagnosis present

## 2020-10-13 DIAGNOSIS — M8629 Subacute osteomyelitis, multiple sites: Secondary | ICD-10-CM

## 2020-10-13 DIAGNOSIS — Z7901 Long term (current) use of anticoagulants: Secondary | ICD-10-CM | POA: Diagnosis not present

## 2020-10-13 DIAGNOSIS — D75838 Other thrombocytosis: Secondary | ICD-10-CM | POA: Diagnosis present

## 2020-10-13 DIAGNOSIS — L89524 Pressure ulcer of left ankle, stage 4: Secondary | ICD-10-CM | POA: Diagnosis present

## 2020-10-13 DIAGNOSIS — D638 Anemia in other chronic diseases classified elsewhere: Secondary | ICD-10-CM | POA: Diagnosis present

## 2020-10-13 DIAGNOSIS — L8990 Pressure ulcer of unspecified site, unspecified stage: Secondary | ICD-10-CM | POA: Insufficient documentation

## 2020-10-13 DIAGNOSIS — G8220 Paraplegia, unspecified: Secondary | ICD-10-CM | POA: Diagnosis present

## 2020-10-13 DIAGNOSIS — Z86718 Personal history of other venous thrombosis and embolism: Secondary | ICD-10-CM | POA: Diagnosis not present

## 2020-10-13 DIAGNOSIS — M869 Osteomyelitis, unspecified: Secondary | ICD-10-CM | POA: Diagnosis present

## 2020-10-13 DIAGNOSIS — L89610 Pressure ulcer of right heel, unstageable: Secondary | ICD-10-CM | POA: Diagnosis present

## 2020-10-13 DIAGNOSIS — L89154 Pressure ulcer of sacral region, stage 4: Secondary | ICD-10-CM | POA: Diagnosis present

## 2020-10-13 DIAGNOSIS — Z20822 Contact with and (suspected) exposure to covid-19: Secondary | ICD-10-CM | POA: Diagnosis present

## 2020-10-13 DIAGNOSIS — Z681 Body mass index (BMI) 19 or less, adult: Secondary | ICD-10-CM | POA: Diagnosis not present

## 2020-10-13 DIAGNOSIS — F17200 Nicotine dependence, unspecified, uncomplicated: Secondary | ICD-10-CM | POA: Diagnosis present

## 2020-10-13 DIAGNOSIS — S31000D Unspecified open wound of lower back and pelvis without penetration into retroperitoneum, subsequent encounter: Secondary | ICD-10-CM | POA: Diagnosis not present

## 2020-10-13 DIAGNOSIS — Z79899 Other long term (current) drug therapy: Secondary | ICD-10-CM | POA: Diagnosis not present

## 2020-10-13 LAB — CBC WITH DIFFERENTIAL/PLATELET
Abs Immature Granulocytes: 0.03 10*3/uL (ref 0.00–0.07)
Basophils Absolute: 0 10*3/uL (ref 0.0–0.1)
Basophils Relative: 1 %
Eosinophils Absolute: 0.1 10*3/uL (ref 0.0–0.5)
Eosinophils Relative: 1 %
HCT: 36.6 % — ABNORMAL LOW (ref 39.0–52.0)
Hemoglobin: 10.9 g/dL — ABNORMAL LOW (ref 13.0–17.0)
Immature Granulocytes: 0 %
Lymphocytes Relative: 15 %
Lymphs Abs: 1.3 10*3/uL (ref 0.7–4.0)
MCH: 21.5 pg — ABNORMAL LOW (ref 26.0–34.0)
MCHC: 29.8 g/dL — ABNORMAL LOW (ref 30.0–36.0)
MCV: 72.2 fL — ABNORMAL LOW (ref 80.0–100.0)
Monocytes Absolute: 0.8 10*3/uL (ref 0.1–1.0)
Monocytes Relative: 9 %
Neutro Abs: 6.6 10*3/uL (ref 1.7–7.7)
Neutrophils Relative %: 74 %
Platelets: 509 10*3/uL — ABNORMAL HIGH (ref 150–400)
RBC: 5.07 MIL/uL (ref 4.22–5.81)
RDW: 17.9 % — ABNORMAL HIGH (ref 11.5–15.5)
WBC: 8.8 10*3/uL (ref 4.0–10.5)
nRBC: 0 % (ref 0.0–0.2)

## 2020-10-13 LAB — RESPIRATORY PANEL BY RT PCR (FLU A&B, COVID)
Influenza A by PCR: NEGATIVE
Influenza B by PCR: NEGATIVE
SARS Coronavirus 2 by RT PCR: NEGATIVE

## 2020-10-13 LAB — COMPREHENSIVE METABOLIC PANEL
ALT: 20 U/L (ref 0–44)
AST: 22 U/L (ref 15–41)
Albumin: 3 g/dL — ABNORMAL LOW (ref 3.5–5.0)
Alkaline Phosphatase: 99 U/L (ref 38–126)
Anion gap: 11 (ref 5–15)
BUN: 11 mg/dL (ref 6–20)
CO2: 22 mmol/L (ref 22–32)
Calcium: 9.3 mg/dL (ref 8.9–10.3)
Chloride: 100 mmol/L (ref 98–111)
Creatinine, Ser: 0.75 mg/dL (ref 0.61–1.24)
GFR, Estimated: >60 mL/min (ref >60)
Glucose, Bld: 90 mg/dL (ref 70–99)
Potassium: 4.7 mmol/L (ref 3.5–5.1)
Sodium: 133 mmol/L — ABNORMAL LOW (ref 135–145)
Total Bilirubin: 0.7 mg/dL (ref 0.3–1.2)
Total Protein: 8.3 g/dL — ABNORMAL HIGH (ref 6.5–8.1)

## 2020-10-13 LAB — SEDIMENTATION RATE: Sed Rate: 55 mm/hr — ABNORMAL HIGH (ref 0–16)

## 2020-10-13 LAB — LACTIC ACID, PLASMA: Lactic Acid, Venous: 0.8 mmol/L (ref 0.5–1.9)

## 2020-10-13 MED ORDER — VANCOMYCIN HCL 1500 MG/300ML IV SOLN
1500.0000 mg | Freq: Once | INTRAVENOUS | Status: AC
  Administered 2020-10-13: 1500 mg via INTRAVENOUS
  Filled 2020-10-13: qty 300

## 2020-10-13 MED ORDER — MORPHINE SULFATE (PF) 4 MG/ML IV SOLN: 4.0000 mg | Freq: Once | INTRAVENOUS | Status: DC

## 2020-10-13 MED ORDER — ENOXAPARIN SODIUM 40 MG/0.4ML ~~LOC~~ SOLN: 40.0000 mg | SUBCUTANEOUS | Status: DC

## 2020-10-13 MED ORDER — VANCOMYCIN HCL 1250 MG/250ML IV SOLN
1250.0000 mg | Freq: Two times a day (BID) | INTRAVENOUS | Status: DC
  Administered 2020-10-14 – 2020-10-15 (×3): 1250 mg via INTRAVENOUS
  Filled 2020-10-13 (×4): qty 250

## 2020-10-13 MED ORDER — OXYCODONE HCL 5 MG PO TABS
15.0000 mg | ORAL_TABLET | Freq: Four times a day (QID) | ORAL | Status: DC | PRN
  Administered 2020-10-14 – 2020-10-16 (×6): 15 mg via ORAL
  Filled 2020-10-13 (×6): qty 3

## 2020-10-13 MED ORDER — ACETAMINOPHEN 325 MG PO TABS: 650.0000 mg | ORAL_TABLET | ORAL | Status: DC | PRN

## 2020-10-13 MED ORDER — ENOXAPARIN SODIUM 80 MG/0.8ML ~~LOC~~ SOLN
62.5000 mg | Freq: Once | SUBCUTANEOUS | Status: AC
  Administered 2020-10-13: 62.5 mg via SUBCUTANEOUS
  Filled 2020-10-13: qty 0.63

## 2020-10-13 MED ORDER — COLLAGENASE 250 UNIT/GM EX OINT
TOPICAL_OINTMENT | Freq: Two times a day (BID) | CUTANEOUS | Status: DC
  Filled 2020-10-13: qty 30

## 2020-10-13 MED ORDER — HYDROMORPHONE HCL 1 MG/ML IJ SOLN: 0.5000 mg | INTRAMUSCULAR | Status: DC | PRN

## 2020-10-13 MED ORDER — BACLOFEN 10 MG PO TABS
40.0000 mg | ORAL_TABLET | Freq: Every day | ORAL | Status: DC | PRN
  Administered 2020-10-13 – 2020-10-14 (×2): 40 mg via ORAL
  Filled 2020-10-13 (×2): qty 4

## 2020-10-13 MED ORDER — ENOXAPARIN SODIUM 80 MG/0.8ML ~~LOC~~ SOLN
1.0000 mg/kg | Freq: Two times a day (BID) | SUBCUTANEOUS | Status: DC
  Filled 2020-10-13: qty 0.63

## 2020-10-13 MED ORDER — PIPERACILLIN-TAZOBACTAM 3.375 G IVPB 30 MIN
3.3750 g | Freq: Once | INTRAVENOUS | Status: AC
  Administered 2020-10-13: 3.375 g via INTRAVENOUS
  Filled 2020-10-13: qty 50

## 2020-10-13 MED ORDER — PIPERACILLIN-TAZOBACTAM 3.375 G IVPB
3.3750 g | Freq: Three times a day (TID) | INTRAVENOUS | Status: DC
  Administered 2020-10-13 – 2020-10-15 (×5): 3.375 g via INTRAVENOUS
  Filled 2020-10-13 (×6): qty 50

## 2020-10-13 MED ORDER — ENOXAPARIN SODIUM 80 MG/0.8ML ~~LOC~~ SOLN
1.0000 mg/kg | Freq: Two times a day (BID) | SUBCUTANEOUS | Status: DC
  Administered 2020-10-14 – 2020-10-15 (×2): 62.5 mg via SUBCUTANEOUS
  Filled 2020-10-13 (×2): qty 0.63

## 2020-10-13 MED ORDER — MORPHINE SULFATE (PF) 2 MG/ML IV SOLN
2.0000 mg | Freq: Once | INTRAVENOUS | Status: AC
  Administered 2020-10-13: 2 mg via INTRAVENOUS
  Filled 2020-10-13: qty 1

## 2020-10-13 MED ORDER — TRAMADOL HCL 50 MG PO TABS
100.0000 mg | ORAL_TABLET | Freq: Four times a day (QID) | ORAL | Status: DC
  Administered 2020-10-13 – 2020-10-16 (×11): 100 mg via ORAL
  Filled 2020-10-13 (×12): qty 2

## 2020-10-13 NOTE — Consult Note (Addendum)
WOC Nurse Consult Note: Patient receiving care in [facility] ED003 Remote consult. 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 Dressing procedure/placement/frequency: Apply Aquacel Advantage to right heel and left ankle cover with 4 x 4 and wrap with Kerlix. Change daily .  Apply saline moistened gauze in the sacral wound and cover with dry 4 x 4 and ABD pad. Secure with tape.Change BID  This wound is recommended to be evaluated by surgical team for debridement. Surgical team orders will override WOC orders. [place] sent to Dr. Donnald Garre for CCS consult.   Monitor the wound area(s) for worsening of condition such as: Signs/symptoms of infection, increase in size, development of or worsening of odor, development of pain, or increased pain at the affected locations.   Notify the medical team if any of these develop.  Thank you for the consult. WOC nurse will not follow at this time.   Please re-consult the WOC team if needed.  Renaldo Reel Katrinka Blazing, MSN, RN, CMSRN, Angus Seller, [facility] Wound Treatment Associate Pager [phone]

## 2020-10-13 NOTE — ED Notes (Signed)
Patient had bowel movement and was incontinent. Cleaned bowel movement and changed wound dressings on sacrum, left ankle and right heel. Awaiting wound care team
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
## 2020-10-13 NOTE — ED Triage Notes (Signed)
Pt c/o wound infection to left ankle, right achilles and sacral pressure wounds. Pt states increase in smell and drainage x2-3 days. Pt denies any fevers or aches at home. Pt states he goes to wound clinic but he had to make an appt to see them and preferred to get seen faster here.

## 2020-10-13 NOTE — Progress Notes (Signed)
Pharmacy Antibiotic Note  [name] is a 29 y.o. male admitted on 10/13/2020 with Osteo.  Pharmacy has been consulted for Zosyn and Vancomycin dosing.   Weight: 63.5 kg (140 lb)  Temp (24hrs), Avg:99.5 F (37.5 C), Min:99 F (37.2 C), Max:100 F (37.8 C)  Recent Labs  Lab 10/13/20 1310  WBC 8.8  CREATININE 0.75  LATICACIDVEN 0.8    Estimated Creatinine Clearance: 117.4 mL/min (by C-G formula based on SCr of 0.75 mg/dL).    Allergies  Allergen Reactions  . Gabapentin Other (See Comments)    Severe dizziness and vertigo/lightheadedness- couldn't sit up while on it    Antimicrobials this admission: 11/17 Zosyn >>  11/17 Vancomycin >>   Dose adjustments this admission: N/a  Microbiology results: Pending   Plan:  - Zosyn 3.375g IV x 1 dose over 30 min followed by Zosyn 3.375g IV every 8 hours infused over 4 hours  - Vancomycin 1500mg  IV x 1 dose  - Followed by Vancomycin 1250mg  IV q12h - Recommend avoiding q8h dosing unless levels indicate as patient is paraplegic  - Monitor patients renal function and urine output  - De-escalate ABX when appropriate   Thank you for allowing pharmacy to be a part of this patient's care.  PharmD. BCPS 10/13/2020 4:49 PM
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
## 2020-10-13 NOTE — ED Notes (Signed)
Help get patient cleaned up patient is resting with call bell in reach

## 2020-10-13 NOTE — Telephone Encounter (Signed)
Dr. Chipper Herb [phone] hospital consult for the following:  Large ulcer, left ankle Small ulcer, right heel  Requesting evaluation for debridement, location  Ed Bed 3  Please advise
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
## 2020-10-13 NOTE — ED Notes (Signed)
Help get patient on the monitor patient is resting with nurse at bedside 

## 2020-10-14 DIAGNOSIS — S31000D Unspecified open wound of lower back and pelvis without penetration into retroperitoneum, subsequent encounter: Secondary | ICD-10-CM

## 2020-10-14 DIAGNOSIS — L97422 Non-pressure chronic ulcer of left heel and midfoot with fat layer exposed: Secondary | ICD-10-CM

## 2020-10-14 DIAGNOSIS — L8990 Pressure ulcer of unspecified site, unspecified stage: Secondary | ICD-10-CM | POA: Insufficient documentation

## 2020-10-14 LAB — CBC
HCT: 34.8 % — ABNORMAL LOW (ref 39.0–52.0)
Hemoglobin: 10.4 g/dL — ABNORMAL LOW (ref 13.0–17.0)
MCH: 21.1 pg — ABNORMAL LOW (ref 26.0–34.0)
MCHC: 29.9 g/dL — ABNORMAL LOW (ref 30.0–36.0)
MCV: 70.6 fL — ABNORMAL LOW (ref 80.0–100.0)
Platelets: 527 10*3/uL — ABNORMAL HIGH (ref 150–400)
RBC: 4.93 MIL/uL (ref 4.22–5.81)
RDW: 17.7 % — ABNORMAL HIGH (ref 11.5–15.5)
WBC: 6 10*3/uL (ref 4.0–10.5)
nRBC: 0 % (ref 0.0–0.2)

## 2020-10-14 LAB — BASIC METABOLIC PANEL
Anion gap: 11 (ref 5–15)
BUN: 12 mg/dL (ref 6–20)
CO2: 25 mmol/L (ref 22–32)
Calcium: 8.7 mg/dL — ABNORMAL LOW (ref 8.9–10.3)
Chloride: 99 mmol/L (ref 98–111)
Creatinine, Ser: 0.95 mg/dL (ref 0.61–1.24)
GFR, Estimated: >60 mL/min (ref >60)
Glucose, Bld: 86 mg/dL (ref 70–99)
Potassium: 3.9 mmol/L (ref 3.5–5.1)
Sodium: 135 mmol/L (ref 135–145)

## 2020-10-14 MED ORDER — ADULT MULTIVITAMIN W/MINERALS CH
1.0000 | ORAL_TABLET | Freq: Every day | ORAL | Status: DC
  Administered 2020-10-14 – 2020-10-16 (×3): 1 via ORAL
  Filled 2020-10-14 (×3): qty 1

## 2020-10-14 MED ORDER — ENSURE ENLIVE PO LIQD: 237.0000 mL | Freq: Three times a day (TID) | ORAL | Status: DC

## 2020-10-14 MED ORDER — BACLOFEN 10 MG PO TABS
20.0000 mg | ORAL_TABLET | Freq: Four times a day (QID) | ORAL | Status: DC | PRN
  Administered 2020-10-14 – 2020-10-16 (×7): 20 mg via ORAL
  Filled 2020-10-14 (×7): qty 2

## 2020-10-14 MED ORDER — SILVER NITRATE-POT NITRATE 75-25 % EX MISC
1.0000 "application " | Freq: Once | CUTANEOUS | Status: DC
  Filled 2020-10-14: qty 1
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
## 2020-10-14 NOTE — Telephone Encounter (Signed)
Please make sure we see this patient today. Thanks

## 2020-10-14 NOTE — Progress Notes (Signed)
Physical Therapy Wound Treatment Patient Details  Name: [name] MRN: [id number] Date of Birth: [birth year]  Today's Date: 10/14/2020 Time: 1130-1225 Time Calculation (min): 55 min  Subjective  Subjective: Very pleasant and agreeable to wound care Patient and Family Stated Goals: heal wound Date of Onset:  (unknown) Prior Treatments: wet to dry dressings at home  Pain Score:  Reports no pain with wound care but did have painful spasms with grimacing and moaning from positioning  Wound Assessment  Pressure Injury 10/13/20 Sacrum Mid;Lower Stage 4 - Full thickness tissue loss with exposed bone, tendon or muscle. Large round wound w/tunneling, undermining & necrosis (Active)  Wound Image   10/14/20 1350  Dressing Type ABD;Barrier Film (skin prep);Gauze (Comment) 10/14/20 1350  Dressing Changed;Clean;Dry;Intact 10/14/20 1350  Dressing Change Frequency Daily 10/14/20 1350  State of Healing Early/partial granulation 10/14/20 1350  Site / Wound Assessment Pink;Yellow;Black;Other (Comment)-connective tissue at base 10/14/20 1350  % Wound base Red or Granulating 40% 10/14/20 1350  % Wound base Yellow/Fibrinous Exudate 35% 10/14/20 1350  % Wound base Black/Eschar 20% 10/14/20 1350  % Wound base Other/Granulation Tissue (Comment) 5% 10/14/20 1350  Peri-wound Assessment Intact 10/14/20 1350  Wound Length (cm) 8.5 cm 10/14/20 1100  Wound Width (cm) 10 cm 10/14/20 1100  Wound Depth (cm) 2.8 cm 10/14/20 1100  Wound Surface Area (cm^2) 85 cm^2 10/14/20 1100  Wound Volume (cm^3) 238 cm^3 10/14/20 1100  Tunneling (cm) 0 10/14/20 1100  Undermining (cm) 7:00-11:00 3.0 cm; 2:00-5:00 1.2 cm 10/14/20 1100  Margins Unattached edges (unapproximated) 10/14/20 1350  Drainage Amount Moderate 10/14/20 1350  Drainage Description Serosanguineous 10/14/20 1350  Treatment Debridement (Selective);Hydrotherapy (Pulse lavage);Packing (Saline gauze);Santyl applied to wound bed prior to applying dressing.   10/14/20 1350     Hydrotherapy Pulsed lavage therapy - wound location: sacrum Pulsed Lavage with Suction (psi): 12 psi Pulsed Lavage with Suction - Normal Saline Used: 1000 mL Pulsed Lavage Tip: Tip with splash shield Selective Debridement Selective Debridement - Location: sacrum Selective Debridement - Tools Used: Forceps;Scalpel Selective Debridement - Tissue Removed: yellow necrosis   Wound Assessment and Plan  Wound Therapy - Assess/Plan/Recommendations Wound Therapy - Clinical Statement: Pt is 29 yo male with hx of SCI with a stage IV pressure ulcer on sacrum.  Wound with connective tissue visible at base.  Surgical PA in at start of session and debrided from 5:00 edge of wound.  PT was able to perform further debridement at wound base post-hydro.  Did require pressure and silver nitrate to manage small amount of bleeding at 5:00 border. Did note some areas of edges that were rolled under.  Pt will benefit from hydrotherapy and selective debridement to decrease bioburden and necrosis and promote wound granulation and healing. Wound Therapy - Functional Problem List: wound on sacrum limiting ability to sit Factors Delaying/Impairing Wound Healing: Immobility Hydrotherapy Plan: Debridement;Dressing change;Patient/family education;Pulsatile lavage with suction Wound Therapy - Frequency: 6X / week Wound Therapy - Follow Up Recommendations: Home health RN Wound Plan: see above  Wound Therapy Goals- Improve the function of patient's integumentary system by progressing the wound(s) through the phases of wound healing (inflammation - proliferation - remodeling) by: Decrease Necrotic Tissue to: 10% Decrease Necrotic Tissue - Progress: Goal set today Increase Granulation Tissue to: 80% Increase Granulation Tissue - Progress: Goal set today Goals/treatment plan/discharge plan were made with and agreed upon by patient/family: Yes Time For Goal Achievement: 7 days Wound Therapy - Potential for  Goals: Excellent  Goals will be updated until  maximal potential achieved or discharge criteria met.  Discharge criteria: when goals achieved, discharge from hospital, MD decision/surgical intervention, no progress towards goals, refusal/missing three consecutive treatments without notification or medical reason.  GP   Abran Richard, PT Acute Rehab Services Pager [phone] [facility] [phone]    [name] 10/14/2020, 2:07 PM

## 2020-10-14 NOTE — Telephone Encounter (Signed)
Dr. Chipper Herb [phone] hospital consult , provider called to follow up. Wanted to make sure a provider is going to come see this patient
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
## 2020-10-14 NOTE — Plan of Care (Signed)
  Problem: Education: Goal: Knowledge of General Education information will improve Description: Including pain rating scale, medication(s)/side effects and non-pharmacologic comfort measures Outcome: Progressing   Problem: Health Behavior/Discharge Planning: Goal: Ability to manage health-related needs will improve Outcome: Progressing   Problem: Activity: Goal: Risk for activity intolerance will decrease Outcome: Progressing   Problem: Nutrition: Goal: Adequate nutrition will be maintained Outcome: Progressing   Problem: Coping: Goal: Level of anxiety will decrease Outcome: Progressing   Problem: Skin Integrity: Goal: Risk for impaired skin integrity will decrease Outcome: Progressing   

## 2020-10-15 DIAGNOSIS — S31000D Unspecified open wound of lower back and pelvis without penetration into retroperitoneum, subsequent encounter: Secondary | ICD-10-CM | POA: Diagnosis not present

## 2020-10-15 MED ORDER — ENSURE ENLIVE PO LIQD: 237.0000 mL | Freq: Three times a day (TID) | ORAL | Status: DC

## 2020-10-15 MED ORDER — METRONIDAZOLE 500 MG PO TABS
500.0000 mg | ORAL_TABLET | Freq: Three times a day (TID) | ORAL | Status: DC
  Administered 2020-10-15 – 2020-10-16 (×4): 500 mg via ORAL
  Filled 2020-10-15 (×4): qty 1

## 2020-10-15 MED ORDER — ENOXAPARIN SODIUM 60 MG/0.6ML ~~LOC~~ SOLN: 60.0000 mg | Freq: Two times a day (BID) | SUBCUTANEOUS | Status: DC

## 2020-10-15 MED ORDER — RIVAROXABAN 20 MG PO TABS
20.0000 mg | ORAL_TABLET | Freq: Every day | ORAL | Status: DC
  Administered 2020-10-15: 20 mg via ORAL
  Filled 2020-10-15: qty 1

## 2020-10-15 MED ORDER — SODIUM CHLORIDE 0.9 % IV SOLN
1.0000 g | INTRAVENOUS | Status: DC
  Administered 2020-10-15 – 2020-10-16 (×2): 1 g via INTRAVENOUS
  Filled 2020-10-15 (×2): qty 10

## 2020-10-15 NOTE — Telephone Encounter (Signed)
Consult completed

## 2020-10-15 NOTE — Plan of Care (Signed)
  Problem: Education: Goal: Knowledge of General Education information will improve Description: Including pain rating scale, medication(s)/side effects and non-pharmacologic comfort measures Outcome: Progressing   Problem: Health Behavior/Discharge Planning: Goal: Ability to manage health-related needs will improve Outcome: Progressing   Problem: Clinical Measurements: Goal: Will remain free from infection Outcome: Progressing   Problem: Nutrition: Goal: Adequate nutrition will be maintained Outcome: Progressing   Problem: Elimination: Goal: Will not experience complications related to bowel motility Outcome: Progressing   Problem: Pain Managment: Goal: General experience of comfort will improve Outcome: Progressing   Problem: Safety: Goal: Ability to remain free from injury will improve Outcome: Progressing   Problem: Skin Integrity: Goal: Risk for impaired skin integrity will decrease Outcome: Progressing   

## 2020-10-15 NOTE — Progress Notes (Signed)
Physical Therapy Wound Treatment Patient Details  Name: [name] MRN: [id number] Date of Birth: [birth year]  Today's Date: 10/15/2020 Time: 9379-0240 Time Calculation (min): 42 min  Subjective  Subjective: Very pleasant and agreeable to hydrotherapy Patient and Family Stated Goals: heal wound Date of Onset:  (unknown) Prior Treatments: wet to dry dressings at home  Pain Score: Pt declines pain during session  Wound Assessment  Pressure Injury 10/13/20 Sacrum Mid;Lower Stage 4 - Full thickness tissue loss with exposed bone, tendon or muscle. Large round wound w/tunneling, undermining & necrosis (Active)  Dressing Type ABD;Barrier Film (skin prep);Gauze (Comment);Moist to dry 10/15/20 1342  Dressing Changed;Clean;Dry;Intact 10/15/20 1342  Dressing Change Frequency Daily 10/15/20 1342  State of Healing Early/partial granulation 10/15/20 1342  Site / Wound Assessment Pink;Yellow;Brown 10/15/20 1342  % Wound base Red or Granulating 50% 10/15/20 1342  % Wound base Yellow/Fibrinous Exudate 45% 10/15/20 1342  % Wound base Black/Eschar 5% 10/15/20 1342  % Wound base Other/Granulation Tissue (Comment) 0% 10/15/20 1342  Peri-wound Assessment Intact 10/15/20 1342  Wound Length (cm) 8.5 cm 10/14/20 1100  Wound Width (cm) 10 cm 10/14/20 1100  Wound Depth (cm) 2.8 cm 10/14/20 1100  Wound Surface Area (cm^2) 85 cm^2 10/14/20 1100  Wound Volume (cm^3) 238 cm^3 10/14/20 1100  Tunneling (cm) 0 10/14/20 1100  Undermining (cm) 7:00-11:00 3.0 cm; 2:00-5:00 1.2 cm 10/14/20 1100  Margins Unattached edges (unapproximated) 10/15/20 1342  Drainage Amount Moderate 10/15/20 1342  Drainage Description Serosanguineous 10/15/20 1342  Treatment Debridement (Selective);Hydrotherapy (Pulse lavage);Packing (Saline gauze) 10/15/20 1342   Santyl applied to wound bed prior to applying dressing.     Hydrotherapy Pulsed lavage therapy - wound location: sacrum Pulsed Lavage with Suction (psi): 12 psi Pulsed  Lavage with Suction - Normal Saline Used: 1000 mL Pulsed Lavage Tip: Tip with splash shield Selective Debridement Selective Debridement - Location: sacrum Selective Debridement - Tools Used: Forceps;Scalpel;Scissors Selective Debridement - Tissue Removed: yellow and brown necrotic tissue   Wound Assessment and Plan  Wound Therapy - Assess/Plan/Recommendations Wound Therapy - Clinical Statement: Pt appears discouraged that he is not discharging today. Some area that was debrided by PA at bedside yesterday appears covered with yellow slough. Silver nitrate uitilized to manage bleeding in necrotic area around 5:00. Will continue to follow for selective removal of unviable tissue, to decrease bioburden and promote wound bed healing.  Wound Therapy - Functional Problem List: wound on sacrum limiting ability to sit Factors Delaying/Impairing Wound Healing: Immobility Hydrotherapy Plan: Debridement;Dressing change;Patient/family education;Pulsatile lavage with suction Wound Therapy - Frequency: 6X / week Wound Therapy - Follow Up Recommendations: Home health RN Wound Plan: see above  Wound Therapy Goals- Improve the function of patient's integumentary system by progressing the wound(s) through the phases of wound healing (inflammation - proliferation - remodeling) by: Decrease Necrotic Tissue to: 10% Decrease Necrotic Tissue - Progress: Progressing toward goal Increase Granulation Tissue to: 80% Increase Granulation Tissue - Progress: Progressing toward goal Goals/treatment plan/discharge plan were made with and agreed upon by patient/family: Yes Time For Goal Achievement: 7 days Wound Therapy - Potential for Goals: Excellent  Goals will be updated until maximal potential achieved or discharge criteria met.  Discharge criteria: when goals achieved, discharge from hospital, MD decision/surgical intervention, no progress towards goals, refusal/missing three consecutive treatments without  notification or medical reason.  GP     Thelma Comp 10/15/2020, 3:12 PM  Rolinda Roan, PT, DPT Acute Rehabilitation Services Pager: [phone] Office: [phone]
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
## 2020-10-15 NOTE — Discharge Instructions (Addendum)
Information on my medicine - XARELTO (rivaroxaban)  This medication education was reviewed with me or my healthcare representative as part of my discharge preparation.    WHY WAS XARELTO PRESCRIBED FOR YOU? Xarelto was prescribed to treat blood clots that may have been found in the veins of your legs (deep vein thrombosis) or in your lungs (pulmonary embolism) and to reduce the risk of them occurring again.  What do you need to know about Xarelto? The dose is one 20 mg tablet taken ONCE A DAY with your evening meal.  DO NOT stop taking Xarelto without talking to the health care provider who prescribed the medication.  Refill your prescription for 20 mg tablets before you run out.  After discharge, you should have regular check-up appointments with your healthcare provider that is prescribing your Xarelto.  In the future your dose may need to be changed if your kidney function changes by a significant amount.  What do you do if you miss a dose? If you are taking Xarelto TWICE DAILY and you miss a dose, take it as soon as you remember. You may take two 15 mg tablets (total 30 mg) at the same time then resume your regularly scheduled 15 mg twice daily the next day.  If you are taking Xarelto ONCE DAILY and you miss a dose, take it as soon as you remember on the same day then continue your regularly scheduled once daily regimen the next day. Do not take two doses of Xarelto at the same time.   Important Safety Information Xarelto is a blood thinner medicine that can cause bleeding. You should call your healthcare provider right away if you experience any of the following: ? Bleeding from an injury or your nose that does not stop. ? Unusual colored urine (red or dark brown) or unusual colored stools (red or black). ? Unusual bruising for unknown reasons. ? A serious fall or if you hit your head (even if there is no bleeding).  Some medicines may interact with Xarelto and might  increase your risk of bleeding while on Xarelto. To help avoid this, consult your healthcare provider or pharmacist prior to using any new prescription or non-prescription medications, including herbals, vitamins, non-steroidal anti-inflammatory drugs (NSAIDs) and supplements.  This website has more information on Xarelto: VisitDestination.[email or web address].br.    Recommendations/Wound care instructions for buttock wound: For the next 3 days - Cleanse with NS, pat gently dry. Fill defects with santyl moistened roll gauze, top with dry gauze, ABD pads and secure with tape. Change PRN for soiling, otherwise twice daily. After 5 days switch to (ie 10/19/20) - Cleanse with NS, pat gently dry. Fill defects with saline moistened roll gauze, top with dry gauze, ABD pads and secure with tape. Change PRN for soiling, otherwise twice daily.   Additional discharge instructions:  Please get your medications reviewed and adjusted by your Primary MD.  Please request your Primary MD to go over all Hospital Tests and Procedure/Radiological results at the follow up, please get all Hospital records sent to your Prim MD by signing hospital release before you go home.  If you had Pneumonia of Lung problems at the Hospital: Please get a 2 view Chest X ray done in 6-8 weeks after hospital discharge or sooner if instructed by your Primary MD.  If you have Congestive Heart Failure: Please call your Cardiologist or Primary MD anytime you have any of the following symptoms:  1) 3 pound weight gain in 24 hours or 5  pounds in 1 week  2) shortness of breath, with or without a dry hacking cough  3) swelling in the hands, feet or stomach  4) if you have to sleep on extra pillows at night in order to breathe  Follow cardiac low salt diet and 1.5 lit/day fluid restriction.  If you have diabetes Accuchecks 4 times/day, Once in AM empty stomach and then before each meal. Log in all results and show them to your primary doctor at your next  visit. If any glucose reading is under 80 or above 300 call your primary MD immediately.  If you have Seizure/Convulsions/Epilepsy: Please do not drive, operate heavy machinery, participate in activities at heights or participate in high speed sports until you have seen by Primary MD or a Neurologist and advised to do so again.  If you had Gastrointestinal Bleeding: Please ask your Primary MD to check a complete blood count within one week of discharge or at your next visit. Your endoscopic/colonoscopic biopsies that are pending at the time of discharge, will also need to followed by your Primary MD.  Get Medicines reviewed and adjusted. Please take all your medications with you for your next visit with your Primary MD  Please request your Primary MD to go over all hospital tests and procedure/radiological results at the follow up, please ask your Primary MD to get all Hospital records sent to his/her office.  If you experience worsening of your admission symptoms, develop shortness of breath, life threatening emergency, suicidal or homicidal thoughts you must seek medical attention immediately by calling 911 or calling your MD immediately  if symptoms less severe.  You must read complete instructions/literature along with all the possible adverse reactions/side effects for all the Medicines you take and that have been prescribed to you. Take any new Medicines after you have completely understood and accpet all the possible adverse reactions/side effects.   Do not drive or operate heavy machinery when taking Pain medications.   Do not take more than prescribed Pain, Sleep and Anxiety Medications  Special Instructions: If you have smoked or chewed Tobacco  in the last 2 yrs please stop smoking, stop any regular Alcohol  and or any Recreational drug use.  Wear Seat belts while driving.  Please note You were cared for by a hospitalist during your hospital stay. If you have any questions about  your discharge medications or the care you received while you were in the hospital after you are discharged, you can call the unit and asked to speak with the hospitalist on call if the hospitalist that took care of you is not available. Once you are discharged, your primary care physician will handle any further medical issues. Please note that NO REFILLS for any discharge medications will be authorized once you are discharged, as it is imperative that you return to your primary care physician (or establish a relationship with a primary care physician if you do not have one) for your aftercare needs so that they can reassess your need for medications and monitor your lab values.  You can reach the hospitalist office at phone [phone] or fax [phone]   If you do not have a primary care physician, you can call [phone] for a physician referral.
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
## 2020-10-16 DIAGNOSIS — S31000D Unspecified open wound of lower back and pelvis without penetration into retroperitoneum, subsequent encounter: Secondary | ICD-10-CM | POA: Diagnosis not present

## 2020-10-16 MED ORDER — RIVAROXABAN 20 MG PO TABS
20.0000 mg | ORAL_TABLET | Freq: Every day | ORAL | 0 refills | Status: DC
Start: 2020-10-16 — End: 2021-05-20

## 2020-10-16 MED ORDER — METRONIDAZOLE 500 MG PO TABS: 500.0000 mg | ORAL_TABLET | Freq: Three times a day (TID) | ORAL | 0 refills | Status: AC

## 2020-10-16 MED ORDER — AMOXICILLIN-POT CLAVULANATE 875-125 MG PO TABS: 1.0000 | ORAL_TABLET | Freq: Two times a day (BID) | ORAL | 0 refills | Status: AC

## 2020-10-16 MED ORDER — ENSURE ENLIVE PO LIQD
237.0000 mL | Freq: Three times a day (TID) | ORAL | 30 refills | Status: DC
Start: 2020-10-16 — End: 2022-09-22

## 2020-10-16 MED ORDER — ADULT MULTIVITAMIN W/MINERALS CH
1.0000 | ORAL_TABLET | Freq: Every day | ORAL | Status: DC
Start: 2020-10-17 — End: 2022-10-20

## 2020-10-16 MED ORDER — COLLAGENASE 250 UNIT/GM EX OINT
TOPICAL_OINTMENT | Freq: Two times a day (BID) | CUTANEOUS | 0 refills | Status: DC
Start: 2020-10-16 — End: 2021-08-07

## 2020-10-16 NOTE — Progress Notes (Signed)
Physical Therapy Wound Treatment Patient Details  Name: [name] MRN: [id number] Date of Birth: 14-Apr-1991  Today's Date: 10/16/2020 Time: 4098-1191 Time Calculation (min): 43 min  Subjective  Subjective: Concerned at end of session regarding increased tone in his LLE and hip (I'm stuck) Patient and Family Stated Goals: heal wound Date of Onset:  (unknown) Prior Treatments: wet to dry dressings at home  Pain Score:  Pt does not report pain however having reported spasms throughout treatment and increased tone in LLE at end of session.   Wound Assessment  Pressure Injury 10/13/20 Sacrum Mid;Lower Stage 4 - Full thickness tissue loss with exposed bone, tendon or muscle. Large round wound w/tunneling, undermining & necrosis (Active)  Wound Image   10/16/20 1318  Dressing Type ABD;Barrier Film (skin prep);Gauze (Comment);Moist to dry 10/16/20 1318  Dressing Changed;Clean;Dry;Intact 10/16/20 1318  Dressing Change Frequency Daily 10/16/20 1318  State of Healing Early/partial granulation 10/16/20 1318  Site / Wound Assessment Pink;Yellow;Brown 10/16/20 1318  % Wound base Red or Granulating 60% 10/16/20 1318  % Wound base Yellow/Fibrinous Exudate 35% 10/16/20 1318  % Wound base Black/Eschar 5% 10/16/20 1318  % Wound base Other/Granulation Tissue (Comment) 0% 10/16/20 1318  Peri-wound Assessment Intact 10/16/20 1318  Wound Length (cm) 8.5 cm 10/14/20 1100  Wound Width (cm) 10 cm 10/14/20 1100  Wound Depth (cm) 2.8 cm 10/14/20 1100  Wound Surface Area (cm^2) 85 cm^2 10/14/20 1100  Wound Volume (cm^3) 238 cm^3 10/14/20 1100  Tunneling (cm) 0 10/14/20 1100  Undermining (cm) 7:00-11:00 3.0 cm; 2:00-5:00 1.2 cm 10/14/20 1100  Margins Unattached edges (unapproximated) 10/16/20 1318  Drainage Amount Minimal 10/16/20 1318  Drainage Description Serosanguineous 10/16/20 1318  Treatment Debridement (Selective);Hydrotherapy (Pulse lavage);Packing (Saline gauze) 10/16/20 1318   Santyl  applied to wound bed prior to applying dressing.     Hydrotherapy Pulsed lavage therapy - wound location: sacrum Pulsed Lavage with Suction (psi): 12 psi Pulsed Lavage with Suction - Normal Saline Used: 1000 mL Pulsed Lavage Tip: Tip with splash shield Selective Debridement Selective Debridement - Location: sacrum Selective Debridement - Tools Used: Forceps;Scalpel;Scissors Selective Debridement - Tissue Removed: yellow and brown necrotic tissue   Wound Assessment and Plan  Wound Therapy - Assess/Plan/Recommendations Wound Therapy - Clinical Statement: Wound bed improving with increased heathy tissue present. Minimal debridement performed today as most slough present is adherent. Pt bleeding easily in necrotic area around 5:00, so a majority of the area was cross hatched and silver nitrate used to manage bleeding in 1 small area. At the end of session when pt rolled onto his back, pt complained of feeling "stuck". Noted increased hypertonicity in the LLE and pt states he feels it through his L hip and abdomen as well. Pt has been complaining of spasms, however reports this is different and he has never experienced this before. RN and MD notified. This patient will benefit from continued hydrotherapy for selective removal of unviable tissue, to decrease bioburden, and promote wound bed healing.  Wound Therapy - Functional Problem List: wound on sacrum limiting ability to sit Factors Delaying/Impairing Wound Healing: Immobility Hydrotherapy Plan: Debridement;Dressing change;Patient/family education;Pulsatile lavage with suction Wound Therapy - Frequency: 6X / week Wound Therapy - Follow Up Recommendations: Home health RN Wound Plan: see above  Wound Therapy Goals- Improve the function of patient's integumentary system by progressing the wound(s) through the phases of wound healing (inflammation - proliferation - remodeling) by: Decrease Necrotic Tissue to: 10% Decrease Necrotic Tissue -  Progress: Progressing toward goal Increase  Granulation Tissue to: 80% Increase Granulation Tissue - Progress: Progressing toward goal Goals/treatment plan/discharge plan were made with and agreed upon by patient/family: Yes Time For Goal Achievement: 7 days Wound Therapy - Potential for Goals: Excellent  Goals will be updated until maximal potential achieved or discharge criteria met.  Discharge criteria: when goals achieved, discharge from hospital, MD decision/surgical intervention, no progress towards goals, refusal/missing three consecutive treatments without notification or medical reason.  GP     Thelma Comp 10/16/2020, 1:41 PM   Rolinda Roan, PT, DPT Acute Rehabilitation Services Pager: [phone] Office: [phone]
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
## 2020-10-16 NOTE — Progress Notes (Signed)
1300 Pt was c/o increased numbness from waist to his legs after Hydrotherapy. Got very anxious and upset. Vernona Rieger PT and Dr Waymon Amato was able to talk to the pt.  1530 Pt decides to go home today. Some dressing supplies was given to the pt. He said that his mother is a Engineer, civil (consulting) and would do his dressing changes at home.  1630 Discharge instructions was given to pt. Discharged to home picked up by his significant other.

## 2020-10-18 LAB — CULTURE, BLOOD (ROUTINE X 2)
Culture: NO GROWTH
Culture: NO GROWTH

## 2020-10-20 ENCOUNTER — Encounter (HOSPITAL_BASED_OUTPATIENT_CLINIC_OR_DEPARTMENT_OTHER): Payer: Medicaid Other | Attending: Physician Assistant | Admitting: Physician Assistant

## 2020-10-20 ENCOUNTER — Other Ambulatory Visit: Payer: Self-pay

## 2020-10-20 DIAGNOSIS — L89322 Pressure ulcer of left buttock, stage 2: Secondary | ICD-10-CM | POA: Diagnosis not present

## 2020-10-20 DIAGNOSIS — G8221 Paraplegia, complete: Secondary | ICD-10-CM | POA: Diagnosis not present

## 2020-10-20 DIAGNOSIS — L89520 Pressure ulcer of left ankle, unstageable: Secondary | ICD-10-CM | POA: Diagnosis not present

## 2020-10-20 DIAGNOSIS — Z7901 Long term (current) use of anticoagulants: Secondary | ICD-10-CM | POA: Diagnosis not present

## 2020-10-20 DIAGNOSIS — L89610 Pressure ulcer of right heel, unstageable: Secondary | ICD-10-CM | POA: Diagnosis not present

## 2020-10-20 DIAGNOSIS — L89154 Pressure ulcer of sacral region, stage 4: Secondary | ICD-10-CM | POA: Diagnosis present

## 2020-10-20 DIAGNOSIS — Z681 Body mass index (BMI) 19 or less, adult: Secondary | ICD-10-CM | POA: Insufficient documentation

## 2020-10-20 DIAGNOSIS — E43 Unspecified severe protein-calorie malnutrition: Secondary | ICD-10-CM | POA: Insufficient documentation

## 2020-10-20 NOTE — Progress Notes (Addendum)
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 KelpStone III, Mailani Degroote PA-C Entered By: Lenda KelpStone III, Khaza Blansett on 10/20/2020 16:32:24 -------------------------------------------------------------------------------- SuperBill Details Patient Name: Date of Service: [name], [name] 10/20/2020 Medical Record Number: [id number] Patient Account Number: [id number] Date of Birth/Sex: Treating RN: 03/24/1991 (29 y.o. 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, other site Facility Procedures CPT4 Code: [id number] 97 Description: 602 - DEBRIDE W/O ANES NON SELECT 1 Modifier: Quantity: Physician Procedures : CPT4 Code Description  Modifier [id number] - WC PHYS LEVEL 4 - EST PT ICD-10 Diagnosis Description L89.154 Pressure ulcer of sacral region, stage 4 L89.610 Pressure ulcer of right heel, unstageable L89.520 Pressure ulcer of left ankle, unstageable  L89.322 Pressure ulcer of left buttock, stage 2 Quantity: 1 Electronic Signature(s) Signed: 10/20/2020 5:03:49 PM By: Lenda KelpStone III, Ollie Delano PA-C Entered By: Lenda KelpStone III, Jatavius Ellenwood on 10/20/2020 17:03:49

## 2020-10-25 NOTE — Progress Notes (Signed)
[name], [name] ([id number]) Visit Report for 10/20/2020 Arrival Information Details Patient Name: Date of Service: [name], [name] 10/20/2020 2:15 PM Medical Record Number: [id number] Patient Account Number: [id number] Date of Birth/Sex: Treating RN: [birth year] (29 y.o. [name] Primary Care Kesha Hurrell: [name] Other Clinician: Referring Isadora Delorey: Treating Leandria Thier/Extender: [name] in Treatment: 11 Visit Information History Since Last Visit Added or deleted any medications: Yes Patient Arrived: Wheel Chair Any new allergies or adverse reactions: No Arrival Time: 14:50 Had a fall or experienced change in No Accompanied By: self activities of daily living that may affect Transfer Assistance: None risk of falls: Patient Identification Verified: Yes Signs or symptoms of abuse/neglect since last visito No Secondary Verification Process Completed: Yes Hospitalized since last visit: Yes Patient Requires Transmission-Based Precautions: No Implantable device outside of the clinic excluding No Patient Has Alerts: No cellular tissue based products placed in the center since last visit: Has Dressing in Place as Prescribed: Yes Pain Present Now: No Electronic Signature(s) Signed: 10/20/2020 4:29:16 PM By: Shawn Stall Entered By: Shawn Stall on 10/20/2020 15:10:31 -------------------------------------------------------------------------------- Encounter Discharge Information Details Patient Name: Date of Service: [name]. 10/20/2020 2:15 PM Medical Record Number: [id number] Patient Account Number: [id number] Date of Birth/Sex: Treating RN: 1991-04-05 (29 y.o. [name] Primary Care Chaselynn Kepple: [name] Other Clinician: Referring Kimeka Badour: Treating Taren Toops/Extender: Angus Palms Weeks in Treatment: 11 Encounter Discharge Information Items Post Procedure Vitals Discharge Condition:  Stable Temperature (F): 98.1 Ambulatory Status: Wheelchair Pulse (bpm): 102 Discharge Destination: Home Respiratory Rate (breaths/min): 20 Transportation: Private Auto Blood Pressure (mmHg): 151/97 Accompanied By: self Schedule Follow-up Appointment: Yes Clinical Summary of Care: Patient Declined Electronic Signature(s) Signed: 10/20/2020 4:15:58 PM By: Yevonne Pax RN Entered By: Yevonne Pax on 10/20/2020 15:56:55 -------------------------------------------------------------------------------- Lower Extremity Assessment Details Patient Name: Date of Service: [name], [name] 10/20/2020 2:15 PM Medical Record Number: [id number] Patient Account Number: [id number] Date of Birth/Sex: Treating RN: [birth year] (29 y.o. [name] Primary Care Hanford Lust: [name] Other Clinician: Referring Kieara Schwark: Treating Blayne Frankie/Extender: Angus Palms Weeks in Treatment: 11 Edema Assessment Assessed: [name]: Yes] Franne Forts: No] Edema: [Left: No] [Right: No] Calf Left: Right: Point of Measurement: 47 cm From Medial Instep 27 cm Ankle Left: Right: Point of Measurement: 12 cm From Medial Instep 20 cm Vascular Assessment Pulses: Dorsalis Pedis Palpable: [Left:Yes] Electronic Signature(s) Signed: 10/20/2020 4:29:16 PM By: Shawn Stall Entered By: Shawn Stall on 10/20/2020 15:14:33 -------------------------------------------------------------------------------- Multi-Disciplinary Care Plan Details Patient Name: Date of Service: [name] 10/20/2020 2:15 PM Medical Record Number: [id number] Patient Account Number: [id number] Date of Birth/Sex: Treating RN: [birth year] (29 y.o. Charlean Merl, Lauren Primary Care Breasia Karges: [name] Other Clinician: Referring Lorenz Donley: Treating Bonnetta Allbee/Extender: [name] in Treatment: 11 Active Inactive Pressure Nursing Diagnoses: Knowledge deficit related to causes and  risk factors for pressure ulcer development Goals: Patient/caregiver will verbalize risk factors for pressure ulcer development Date Initiated: 07/30/2020 Target Resolution Date: 11/19/2020 Goal Status: Active Interventions: Provide education on pressure ulcers Notes: Wound/Skin Impairment Nursing Diagnoses: Impaired tissue integrity Goals: Ulcer/skin breakdown will have a volume reduction of 50% by week 8 Date Initiated: 07/30/2020 Date Inactivated: 10/20/2020 Target Resolution Date: 09/27/2020 Goal Status: Unmet Unmet Reason: Infection Ulcer/skin breakdown will have a volume reduction of 80% by week 12 Date Initiated: 10/20/2020 Target Resolution Date: 11/19/2020 Goal Status: Active Interventions: Provide education on ulcer and skin care Notes: Electronic  Signature(s) Signed: 10/25/2020 5:13:46 PM By: Fonnie Mu RN Entered By: Fonnie Mu on 10/20/2020 15:24:58 -------------------------------------------------------------------------------- Pain Assessment Details Patient Name: Date of Service: [name], [name] 10/20/2020 2:15 PM Medical Record Number: [id number] Patient Account Number: [id number] Date of Birth/Sex: Treating RN: 1991/08/17 (29 y.o. [name] Primary Care Nadiyah Zeis: [name] Other Clinician: Referring Tichina Koebel: Treating Redell Nazir/Extender: [name] in Treatment: 11 Active Problems Location of Pain Severity and Description of Pain Patient Has Paino No Site Locations Rate the pain. Current Pain Level: 0 Pain Management and Medication Current Pain Management: Medication: No Cold Application: No Rest: No Massage: No Activity: No T.E.N.S.: No Heat Application: No Leg drop or elevation: No Is the Current Pain Management Adequate: Adequate How does your wound impact your activities of daily livingo Sleep: No Bathing: No Appetite: No Relationship With Others: No Bladder Continence:  No Emotions: No Bowel Continence: No Work: No Toileting: No Drive: No Dressing: No Hobbies: No Electronic Signature(s) Signed: 10/20/2020 4:29:16 PM By: Shawn Stall Entered By: Shawn Stall on 10/20/2020 15:14:15 -------------------------------------------------------------------------------- Patient/Caregiver Education Details Patient Name: Date of Service: [name] 11/24/2021andnbsp2:15 PM Medical Record Number: [id number] Patient Account Number: [id number] Date of Birth/Gender: Treating RN: [birth year] (29 y.o. [name] Primary Care Physician: [name] Other Clinician: Referring Physician: Treating Physician/Extender: [name] in Treatment: 11 Education Assessment Education Provided To: Patient Education Topics Provided Pressure: Methods: Explain/Verbal Responses: Reinforcements needed, State content correctly Wound/Skin Impairment: Methods: Explain/Verbal Responses: Reinforcements needed, State content correctly Electronic Signature(s) Signed: 10/25/2020 5:13:46 PM By: Fonnie Mu RN Entered By: Fonnie Mu on 10/20/2020 15:25:20 -------------------------------------------------------------------------------- Wound Assessment Details Patient Name: Date of Service: [name] 10/20/2020 2:15 PM Medical Record Number: [id number] Patient Account Number: [id number] Date of Birth/Sex: Treating RN: 1991/09/08 (29 y.o. [name] Primary Care Crisanto Nied: [name] Other Clinician: Referring Zaakirah Kistner: Treating Tawana Pasch/Extender: Angus Palms Weeks in Treatment: 11 Wound Status Wound Number: 2 Primary Etiology: Pressure Ulcer Wound Location: Right Achilles Wound Status: Healed - Epithelialized Wounding Event: Gradually Appeared Date Acquired: 06/27/2020 Weeks Of Treatment: 11 Clustered Wound: No Wound Measurements Length: (cm) Width: (cm) Depth:  (cm) Area: (cm) Volume: (cm) 0 % Reduction in Area: 100% 0 % Reduction in Volume: 100% 0 0 0 Wound Description Classification: Category/Stage III Electronic Signature(s) Signed: 10/20/2020 4:29:16 PM By: Shawn Stall Entered By: Shawn Stall on 10/20/2020 15:11:22 -------------------------------------------------------------------------------- Wound Assessment Details Patient Name: Date of Service: HUSAIN, COSTABILE 10/20/2020 2:15 PM Medical Record Number: [id number] Patient Account Number: [id number] Date of Birth/Sex: Treating RN: 10-17-1991 (29 y.o. [name] Primary Care Nasiah Polinsky: [name] Other Clinician: Referring Serge Main: Treating Eunice Winecoff/Extender: Angus Palms Weeks in Treatment: 11 Wound Status Wound Number: 3 Primary Etiology: Pressure Ulcer Wound Location: Left, Lateral Malleolus Wound Status: Open Wounding Event: Gradually Appeared Comorbid History: Paraplegia Date Acquired: 06/27/2020 Weeks Of Treatment: 11 Clustered Wound: No Wound Measurements Length: (cm) 1.5 Width: (cm) 1.5 Depth: (cm) 0.4 Area: (cm) 1.767 Volume: (cm) 0.707 % Reduction in Area: 51.1% % Reduction in Volume: 67.4% Epithelialization: Small (1-33%) Tunneling: No Undermining: No Wound Description Classification: Category/Stage IV Wound Margin: Epibole Exudate Amount: Medium Exudate Type: Serosanguineous Exudate Color: red, brown Foul Odor After Cleansing: Yes Due to Product Use: No Slough/Fibrino Yes Wound Bed Granulation Amount: Large (67-100%) Exposed Structure Granulation Quality: Red Fascia Exposed: No Necrotic Amount: None Present (0%) Fat Layer (Subcutaneous Tissue) Exposed:  Yes Tendon Exposed: Yes Muscle Exposed: No Joint Exposed: No Bone Exposed: Yes Treatment Notes Wound #3 (Left, Lateral Malleolus) 1. Cleanse With Wound Cleanser 3. Primary Dressing Applied Calcium Alginate Ag 4. Secondary Dressing Dry Gauze Roll  Gauze 5. Secured With Secretary/administrator) Signed: 10/20/2020 4:29:16 PM By: Shawn Stall Entered By: Shawn Stall on 10/20/2020 15:12:44 -------------------------------------------------------------------------------- Wound Assessment Details Patient Name: Date of Service: MAXXWELL, EDGETT 10/20/2020 2:15 PM Medical Record Number: [id number] Patient Account Number: [id number] Date of Birth/Sex: Treating RN: 1990/12/20 (29 y.o. [name] Primary Care Bereket Gernert: [name] Other Clinician: Referring Tiffannie Sloss: Treating Chyenne Sobczak/Extender: Angus Palms Weeks in Treatment: 11 Wound Status Wound Number: 4 Primary Etiology: Pressure Ulcer Wound Location: Sacrum Wound Status: Open Wounding Event: Gradually Appeared Comorbid History: Paraplegia Date Acquired: 05/27/2020 Weeks Of Treatment: 11 Clustered Wound: No Wound Measurements Length: (cm) 6.8 Width: (cm) 7.5 Depth: (cm) 1.5 Area: (cm) 40.055 Volume: (cm) 60.083 % Reduction in Area: -183.3% % Reduction in Volume: -123.7% Epithelialization: None Tunneling: No Undermining: Yes Starting Position (o'clock): 12 Ending Position (o'clock): 12 Maximum Distance: (cm) 2.4 Wound Description Classification: Category/Stage IV Wound Margin: Well defined, not attached Exudate Amount: Large Exudate Type: Serosanguineous Exudate Color: red, brown Foul Odor After Cleansing: No Slough/Fibrino Yes Wound Bed Granulation Amount: Medium (34-66%) Exposed Structure Granulation Quality: Pink, Pale Fascia Exposed: No Necrotic Amount: Medium (34-66%) Fat Layer (Subcutaneous Tissue) Exposed: Yes Necrotic Quality: Adherent Slough Tendon Exposed: No Muscle Exposed: No Joint Exposed: No Bone Exposed: Yes Treatment Notes Wound #4 (Sacrum) 1. Cleanse With Wound Cleanser 3. Primary Dressing Applied Santyl 4. Secondary Dressing ABD Pad 5. Secured With Tape Notes saline moistened gauze over  santyl Electronic Signature(s) Signed: 10/20/2020 4:29:16 PM By: Shawn Stall Entered By: Shawn Stall on 10/20/2020 15:13:41 -------------------------------------------------------------------------------- Wound Assessment Details Patient Name: Date of Service: JUSITN, SALSGIVER 10/20/2020 2:15 PM Medical Record Number: [id number] Patient Account Number: [id number] Date of Birth/Sex: Treating RN: Apr 07, 1991 (29 y.o. [name] Primary Care Dayron Odland: [name] Other Clinician: Referring Juanell Saffo: Treating Graden Hoshino/Extender: Angus Palms Weeks in Treatment: 11 Wound Status Wound Number: 5 Primary Etiology: Pressure Ulcer Wound Location: Left Gluteal fold Wound Status: Healed - Epithelialized Wounding Event: Pressure Injury Date Acquired: 08/06/2020 Weeks Of Treatment: 9 Clustered Wound: No Wound Measurements Length: (cm) 0 Width: (cm) 0 Depth: (cm) 0 Area: (cm) 0 Volume: (cm) 0 % Reduction in Area: 100% % Reduction in Volume: 100% Wound Description Classification: Category/Stage III Electronic Signature(s) Signed: 10/20/2020 4:29:16 PM By: Shawn Stall Entered By: Shawn Stall on 10/20/2020 15:11:23 -------------------------------------------------------------------------------- Vitals Details Patient Name: Date of Service: [name] 10/20/2020 2:15 PM Medical Record Number: [id number] Patient Account Number: [id number] Date of Birth/Sex: Treating RN: Apr 21, 1991 (29 y.o. [name] Primary Care Lusine Corlett: [name] Other Clinician: Referring Takiera Mayo: Treating Kenya Kook/Extender: [name] in Treatment: 11 Vital Signs Time Taken: 14:52 Temperature (F): 98.1 Height (in): 71 Pulse (bpm): 102 Weight (lbs): 136 Respiratory Rate (breaths/min): 20 Body Mass Index (BMI): 19 Blood Pressure (mmHg): 151/97 Reference Range: 80 - 120 mg / dl Electronic Signature(s) Signed:  10/20/2020 4:29:16 PM By: Shawn Stall Entered By: Shawn Stall on 10/20/2020 15:14:03

## 2020-10-27 ENCOUNTER — Telehealth: Payer: Self-pay | Admitting: *Deleted

## 2020-10-27 NOTE — Telephone Encounter (Signed)
Patient left a message requesting refills on his tramadol and oxycodone.  His next appointment with Dr. Berline Chough is 11/05/2020.

## 2020-10-28 ENCOUNTER — Other Ambulatory Visit: Payer: Self-pay

## 2020-10-28 MED ORDER — TRAMADOL HCL 50 MG PO TABS: 100.0000 mg | ORAL_TABLET | Freq: Four times a day (QID) | ORAL | 1 refills | Status: DC

## 2020-10-28 MED ORDER — OXYCODONE HCL 10 MG PO TABS: 15.0000 mg | ORAL_TABLET | Freq: Four times a day (QID) | ORAL | 0 refills | Status: DC | PRN

## 2020-10-28 MED ORDER — ACETAMINOPHEN 325 MG PO TABS
325.0000 mg | ORAL_TABLET | ORAL | 6 refills | Status: DC | PRN
Start: 2020-10-28 — End: 2021-04-27

## 2020-10-28 NOTE — Telephone Encounter (Signed)
Will refill Oxycodone 15 mg q6 hours prn #180 and tramadol 2 tabs 4x/day as needed #240-

## 2020-11-03 ENCOUNTER — Encounter (HOSPITAL_BASED_OUTPATIENT_CLINIC_OR_DEPARTMENT_OTHER): Payer: Medicaid Other | Attending: Physician Assistant | Admitting: Physician Assistant

## 2020-11-03 ENCOUNTER — Other Ambulatory Visit: Payer: Self-pay

## 2020-11-03 DIAGNOSIS — L89322 Pressure ulcer of left buttock, stage 2: Secondary | ICD-10-CM | POA: Insufficient documentation

## 2020-11-03 DIAGNOSIS — Z7901 Long term (current) use of anticoagulants: Secondary | ICD-10-CM | POA: Diagnosis not present

## 2020-11-03 DIAGNOSIS — L89154 Pressure ulcer of sacral region, stage 4: Secondary | ICD-10-CM | POA: Insufficient documentation

## 2020-11-03 DIAGNOSIS — L89610 Pressure ulcer of right heel, unstageable: Secondary | ICD-10-CM | POA: Insufficient documentation

## 2020-11-03 DIAGNOSIS — G8221 Paraplegia, complete: Secondary | ICD-10-CM | POA: Diagnosis not present

## 2020-11-03 DIAGNOSIS — L89520 Pressure ulcer of left ankle, unstageable: Secondary | ICD-10-CM | POA: Insufficient documentation

## 2020-11-03 NOTE — Progress Notes (Signed)
DEWAN, EMOND ([id number]) Visit Report for 11/03/2020 Arrival Information Details Patient Name: Date of Service: [name], [name] 11/03/2020 2:15 PM Medical Record Number: [id number] Patient Account Number: [id number] Date of Birth/Sex: Treating RN: [birth year] (29 y.o. [name], [name] Primary Care Alandria Butkiewicz: [name] Other Clinician: Referring Sheniah Supak: Treating Sunnie Odden/Extender: [name] in Treatment: 13 Visit Information History Since Last Visit Added or deleted any medications: No Patient Arrived: Wheel Chair Any new allergies or adverse reactions: No Arrival Time: 14:16 Had [name] fall or experienced change in No Accompanied By: self activities of daily living that may affect Transfer Assistance: None risk of falls: Patient Identification Verified: Yes Signs or symptoms of abuse/neglect since last visito No Secondary Verification Process Completed: Yes Hospitalized since last visit: No Patient Requires Transmission-Based Precautions: No Implantable device outside of the clinic excluding No Patient Has Alerts: No cellular tissue based products placed in the center since last visit: Has Dressing in Place as Prescribed: Yes Pain Present Now: No Electronic Signature(s) Signed: 11/03/2020 5:17:43 PM By: Shawn Stall Entered By: Shawn Stall on 11/03/2020 14:19:36 -------------------------------------------------------------------------------- Clinic Level of Care Assessment Details Patient Name: Date of Service: [name], [name] 11/03/2020 2:15 PM Medical Record Number: [id number] Patient Account Number: [id number] Date of Birth/Sex: Treating RN: 08-15-1991 (29 y.o. [name] Primary Care Javontay Vandam: [name] Other Clinician: Referring Jamel Holzmann: Treating Sumit Branham/Extender: [name] in Treatment: 13 Clinic Level of Care Assessment Items TOOL 4 Quantity Score []  - 0 Use when only an  EandM is performed on FOLLOW-UP visit ASSESSMENTS - Nursing Assessment / Reassessment X- 1 10 Reassessment of Co-morbidities (includes updates in patient status) X- 1 5 Reassessment of Adherence to Treatment Plan ASSESSMENTS - Wound and Skin [name] ssessment / Reassessment []  - 0 Simple Wound Assessment / Reassessment - one wound X- 2 5 Complex Wound Assessment / Reassessment - multiple wounds []  - 0 Dermatologic / Skin Assessment (not related to wound area) ASSESSMENTS - Focused Assessment []  - 0 Circumferential Edema Measurements - multi extremities []  - 0 Nutritional Assessment / Counseling / Intervention X- 1 5 Lower Extremity Assessment (monofilament, tuning fork, pulses) []  - 0 Peripheral Arterial Disease Assessment (using hand held doppler) ASSESSMENTS - Ostomy and/or Continence Assessment and Care []  - 0 Incontinence Assessment and Management []  - 0 Ostomy Care Assessment and Management (repouching, etc.) PROCESS - Coordination of Care X - Simple Patient / Family Education for ongoing care 1 15 []  - 0 Complex (extensive) Patient / Family Education for ongoing care X- 1 10 Staff obtains Consents, Records, T Results / Process Orders est []  - 0 Staff telephones HHA, Nursing Homes / Clarify orders / etc []  - 0 Routine Transfer to another Facility (non-emergent condition) []  - 0 Routine Hospital Admission (non-emergent condition) []  - 0 New Admissions / / Ordering NPWT Apligraf, etc. , []  - 0 Emergency Hospital Admission (emergent condition) X- 1 10 Simple Discharge Coordination []  - 0 Complex (extensive) Discharge Coordination PROCESS - Special Needs []  - 0 Pediatric / Minor Patient Management []  - 0 Isolation Patient Management []  - 0 Hearing / Language / Visual special needs []  - 0 Assessment of Community assistance (transportation, D/C planning, etc.) []  - 0 Additional assistance / Altered mentation []  - 0 Support Surface(s)  Assessment (bed, cushion, seat, etc.) INTERVENTIONS - Wound Cleansing / Measurement []  - 0 Simple Wound Cleansing - one wound X- 2 5 Complex Wound Cleansing - multiple  wounds X- 1 5 Wound Imaging (photographs - any number of wounds) []  - 0 Wound Tracing (instead of photographs) []  - 0 Simple Wound Measurement - one wound X- 2 5 Complex Wound Measurement - multiple wounds INTERVENTIONS - Wound Dressings X - Small Wound Dressing one or multiple wounds 2 10 []  - 0 Medium Wound Dressing one or multiple wounds []  - 0 Large Wound Dressing one or multiple wounds X- 1 5 Application of Medications - topical []  - 0 Application of Medications - injection INTERVENTIONS - Miscellaneous []  - 0 External ear exam []  - 0 Specimen Collection (cultures, biopsies, blood, body fluids, etc.) []  - 0 Specimen(s) / Culture(s) sent or taken to Lab for analysis []  - 0 Patient Transfer (multiple staff / / Similar devices) []  - 0 Simple Staple / Suture removal (25 or less) []  - 0 Complex Staple / Suture removal (26 or more) []  - 0 Hypo / Hyperglycemic Management (close monitor of Blood Glucose) []  - 0 Ankle / Brachial Index (ABI) - do not check if billed separately X- 1 5 Vital Signs Has the patient been seen at the hospital within the last three years: Yes Total Score: 120 Level Of Care: New/Established - Level 4 Electronic Signature(s) Signed: 11/03/2020 6:12:31 PM By: RN, BSN Entered By: on 11/03/2020 15:36:51 -------------------------------------------------------------------------------- Encounter Discharge Information Details Patient Name: Date of Service: L. 11/03/2020 2:15 PM Medical Record Number: Patient Account Number: Date of Birth/Sex: Treating RN: 06-Dec-1990 (29 y.o. Primary Care Raijon Lindfors: Other Clinician: Referring Triston Skare: Treating Jejuan Scala/Extender: in Treatment: 13 Encounter Discharge Information Items Discharge Condition: Stable Ambulatory Status: Wheelchair Discharge Destination: Home Transportation: Private Auto Accompanied By: self Schedule Follow-up Appointment: Yes Clinical Summary of Care: Electronic Signature(s) Signed: 11/03/2020 5:17:43 PM By: Zenaida Deed Entered By: Zenaida Deed on 11/03/2020 15:52:53 -------------------------------------------------------------------------------- Lower Extremity Assessment Details Patient Name: Date of Service: [name], [name] 11/03/2020 2:15 PM Medical Record Number: [id number] Patient Account Number: [id number] Date of Birth/Sex: Treating RN: 1990/12/11 (29 y.o. [name] Primary Care Markelle Asaro: [name] Other Clinician: Referring Jaiah Weigel: Treating Juanita Devincent/Extender: [name] Weeks in Treatment: 13 Edema Assessment Assessed: 14: Yes] 14/06/2020: No] Edema: [Left: No] [Right: No] Calf Left: Right: Point of Measurement: 47 cm From Medial Instep 26.5 cm Ankle Left: Right: Point of Measurement: 12 cm From Medial Instep 17 cm Vascular Assessment Pulses: Dorsalis Pedis Palpable: [Left:Yes] Electronic Signature(s) Signed: 11/03/2020 5:17:43 PM By: Shawn Stall Entered By: 14/06/2020 on 11/03/2020 14:28:49 -------------------------------------------------------------------------------- Multi-Disciplinary Care Plan Details Patient Name: Date of Service: 14/06/2020 L. 11/03/2020 2:15 PM Medical Record Number: [id number] Patient Account Number: 09/29/1991 Date of Birth/Sex: Treating RN: Nov 18, 1991 (29 y.o. [name] Primary Care Raysean Graumann: Angus Palms Other Clinician: Referring Kache Mcclurg: Treating Gennie Dib/Extender: [name] in Treatment: 13 Active Inactive Pressure Nursing Diagnoses: Knowledge deficit related to causes and risk factors for pressure  ulcer development Goals: Patient/caregiver will verbalize risk factors for pressure ulcer development Date Initiated: 07/30/2020 Target Resolution Date: 11/19/2020 Goal Status: Active Interventions: Provide education on pressure ulcers Notes: Wound/Skin Impairment Nursing Diagnoses: Impaired tissue integrity Goals: Ulcer/skin breakdown will have [name] volume reduction of 50% by week 8 Date Initiated: 07/30/2020 Date Inactivated: 10/20/2020 Target Resolution Date: 09/27/2020 Goal Status: Unmet Unmet Reason: Infection Ulcer/skin breakdown will have [name] volume reduction of 80% by week 12  Date Initiated: 10/20/2020 Target Resolution Date: 11/19/2020 Goal Status: Active Interventions: Provide education on ulcer and skin care Notes: Electronic Signature(s) Signed: 11/03/2020 6:12:31 PM By: Zenaida DeedBoehlein, Linda RN, BSN Entered By: Zenaida DeedBoehlein, Linda on 11/03/2020 15:34:16 -------------------------------------------------------------------------------- Pain Assessment Details Patient Name: Date of Service: [name], [name] 11/03/2020 2:15 PM Medical Record Number: [id number] Patient Account Number: [id number] Date of Birth/Sex: Treating RN: 10/17/1991 (29 y.o. Tammy SoursM) Deaton, Bobbi Primary Care Taysean Wager: Raliegh IpStroud, [name] Other Clinician: Referring Patrena Santalucia: Treating Maesyn Frisinger/Extender: Albertine GratesStone III, Hoyt [name], [name] Weeks in Treatment: 13 Active Problems Location of Pain Severity and Description of Pain Patient Has Paino No Site Locations Rate the pain. Current Pain Level: 0 Pain Management and Medication Current Pain Management: Medication: No Cold Application: No Rest: No Massage: No Activity: No T.E.N.S.: No Heat Application: No Leg drop or elevation: No Is the Current Pain Management Adequate: Adequate How does your wound impact your activities of daily livingo Sleep: No Bathing: No Appetite: No Relationship With Others: No Bladder Continence: No Emotions: No Bowel Continence:  No Work: No Toileting: No Drive: No Dressing: No Hobbies: No Electronic Signature(s) Signed: 11/03/2020 5:17:43 PM By: Shawn Stalleaton, Bobbi Entered By: Shawn Stalleaton, Bobbi on 11/03/2020 14:19:49 -------------------------------------------------------------------------------- Patient/Caregiver Education Details Patient Name: Date of Service: [name], [name] 12/8/2021andnbsp2:15 PM Medical Record Number: [id number] Patient Account Number: [id number] Date of Birth/Gender: Treating RN: 01/20/1991 (29 y.o. [name]) Boehlein, Linda Primary Care Physician: Raliegh IpStroud, [name] Other Clinician: Referring Physician: Treating Physician/Extender: Albertine GratesStone III, Hoyt [name], [name] Weeks in Treatment: 13 Education Assessment Education Provided To: Patient Education Topics Provided Pressure: Methods: Explain/Verbal Responses: Reinforcements needed, State content correctly Wound/Skin Impairment: Methods: Explain/Verbal Responses: Reinforcements needed, State content correctly Electronic Signature(s) Signed: 11/03/2020 6:12:31 PM By: Zenaida DeedBoehlein, Linda RN, BSN Entered By: Zenaida DeedBoehlein, Linda on 11/03/2020 15:34:46 -------------------------------------------------------------------------------- Wound Assessment Details Patient Name: Date of Service: [name], [name] 11/03/2020 2:15 PM Medical Record Number: [id number] Patient Account Number: [id number] Date of Birth/Sex: Treating RN: 02/04/1991 (29 y.o. Tammy SoursM) Deaton, Bobbi Primary Care Annajulia Lewing: Raliegh IpStroud, [name] Other Clinician: Referring Aleen Marston: Treating Korbin Notaro/Extender: Angus PalmsStone III, Hoyt [name], [name] Weeks in Treatment: 13 Wound Status Wound Number: 3 Primary Etiology: Pressure Ulcer Wound Location: Left, Lateral Malleolus Wound Status: Open Wounding Event: Gradually Appeared Comorbid History: Paraplegia Date Acquired: 06/27/2020 Weeks Of Treatment: 13 Clustered Wound: No Wound Measurements Length: (cm) 0.6 Width: (cm) 0.3 Depth: (cm) 0.1 Area:  (cm) 0.141 Volume: (cm) 0.014 % Reduction in Area: 96.1% % Reduction in Volume: 99.4% Epithelialization: Large (67-100%) Tunneling: No Undermining: No Wound Description Classification: Category/Stage IV Wound Margin: Epibole Exudate Amount: Medium Exudate Type: Serosanguineous Exudate Color: red, brown Foul Odor After Cleansing: No Slough/Fibrino Yes Wound Bed Granulation Amount: Large (67-100%) Exposed Structure Granulation Quality: Red Fascia Exposed: No Necrotic Amount: Small (1-33%) Fat Layer (Subcutaneous Tissue) Exposed: Yes Necrotic Quality: Adherent Slough Tendon Exposed: Yes Muscle Exposed: No Joint Exposed: No Bone Exposed: Yes Treatment Notes Wound #3 (Malleolus) Wound Laterality: Left, Lateral Cleanser Normal Saline Discharge Instruction: Cleanse the wound with Normal Saline prior to applying [name] clean dressing using gauze sponges, not tissue or cotton balls. Peri-Wound Care Topical Primary Dressing KerraCel Ag Gelling Fiber Dressing, 2x2 in (silver alginate) Discharge Instruction: Apply silver alginate to wound bed as instructed Secondary Dressing Bordered Gauze, 2x2 in Discharge Instruction: or large bandaid Apply over primary dressing as directed. Secured With Compression Wrap Compression Stockings Add-Ons Electronic Signature(s) Signed: 11/03/2020 5:17:43 PM By: Shawn Stalleaton, Bobbi Entered By: Shawn Stalleaton, Bobbi on 11/03/2020 14:29:28 -------------------------------------------------------------------------------- Wound Assessment Details Patient  Name: Date of Service: [name], [name] 11/03/2020 2:15 PM Medical Record Number: [id number] Patient Account Number: [id number] Date of Birth/Sex: Treating RN: [birth year] (29 y.o. [name] Primary Care Brayden Betters: [name] Other Clinician: Referring Elektra Wartman: Treating Jaydynn Wolford/Extender: Angus Palms Weeks in Treatment: 13 Wound Status Wound Number: 4 Primary Etiology:  Pressure Ulcer Wound Location: Sacrum Wound Status: Open Wounding Event: Gradually Appeared Comorbid History: Paraplegia Date Acquired: 05/27/2020 Weeks Of Treatment: 13 Clustered Wound: No Wound Measurements Length: (cm) 4.7 Width: (cm) 7.8 Depth: (cm) 1.5 Area: (cm) 28.793 Volume: (cm) 43.189 % Reduction in Area: -103.7% % Reduction in Volume: -60.8% Epithelialization: Small (1-33%) Tunneling: No Undermining: Yes Starting Position (o'clock): 6 Ending Position (o'clock): 12 Maximum Distance: (cm) 2 Wound Description Classification: Category/Stage IV Wound Margin: Well defined, not attached Exudate Amount: Large Exudate Type: Serosanguineous Exudate Color: red, brown Foul Odor After Cleansing: No Slough/Fibrino Yes Wound Bed Granulation Amount: Large (67-100%) Exposed Structure Granulation Quality: Pink, Pale Fascia Exposed: No Necrotic Amount: Small (1-33%) Fat Layer (Subcutaneous Tissue) Exposed: Yes Necrotic Quality: Adherent Slough Tendon Exposed: No Muscle Exposed: No Joint Exposed: No Bone Exposed: Yes Treatment Notes Wound #4 (Sacrum) Cleanser Peri-Wound Care Topical Primary Dressing Promogran Prisma Matrix, 4.34 (sq in) (silver collagen) Discharge Instruction: to line wound bed, then pack with saline moistened gauze behind collagen Secondary Dressing ABD Pad, 5x9 Discharge Instruction: Apply over primary dressing as directed. Secured With 35M Medipore H Soft Cloth Surgical T 4 x 2 (in/yd) ape Discharge Instruction: Secure dressing with tape as directed. Compression Wrap Compression Stockings Add-Ons Electronic Signature(s) Signed: 11/03/2020 5:17:43 PM By: Shawn Stall Entered By: Shawn Stall on 11/03/2020 14:30:03 -------------------------------------------------------------------------------- Vitals Details Patient Name: Date of Service: [name] March L. 11/03/2020 2:15 PM Medical Record Number: [id number] Patient Account Number:  [id number] Date of Birth/Sex: Treating RN: 01/17/1991 (29 y.o. [name] Primary Care Sherwood Castilla: [name] Other Clinician: Referring Alyna Stensland: Treating Teagan Ozawa/Extender: [name] in Treatment: 13 Vital Signs Time Taken: 14:19 Temperature (F): 97.8 Height (in): 71 Pulse (bpm): 128 Weight (lbs): 136 Respiratory Rate (breaths/min): 16 Body Mass Index (BMI): 19 Blood Pressure (mmHg): 116/81 Reference Range: 80 - 120 mg / dl Electronic Signature(s) Signed: 11/03/2020 5:17:43 PM By: Shawn Stall Entered By: Shawn Stall on 11/03/2020 14:21:07
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
## 2020-11-05 ENCOUNTER — Other Ambulatory Visit: Payer: Self-pay

## 2020-11-05 ENCOUNTER — Encounter
Payer: Medicaid Other | Attending: Physical Medicine and Rehabilitation | Admitting: Physical Medicine and Rehabilitation

## 2020-11-05 ENCOUNTER — Encounter: Payer: Self-pay | Admitting: Physical Medicine and Rehabilitation

## 2020-11-05 VITALS — BP 128/78 | HR 90 | Temp 98.8°F

## 2020-11-05 DIAGNOSIS — R252 Cramp and spasm: Secondary | ICD-10-CM | POA: Diagnosis present

## 2020-11-05 DIAGNOSIS — Z7901 Long term (current) use of anticoagulants: Secondary | ICD-10-CM | POA: Diagnosis present

## 2020-11-05 DIAGNOSIS — Z5181 Encounter for therapeutic drug level monitoring: Secondary | ICD-10-CM | POA: Diagnosis present

## 2020-11-05 DIAGNOSIS — S31000D Unspecified open wound of lower back and pelvis without penetration into retroperitoneum, subsequent encounter: Secondary | ICD-10-CM | POA: Diagnosis present

## 2020-11-05 DIAGNOSIS — G8220 Paraplegia, unspecified: Secondary | ICD-10-CM

## 2020-11-05 DIAGNOSIS — N521 Erectile dysfunction due to diseases classified elsewhere: Secondary | ICD-10-CM

## 2020-11-05 DIAGNOSIS — M898X9 Other specified disorders of bone, unspecified site: Secondary | ICD-10-CM | POA: Diagnosis not present

## 2020-11-05 DIAGNOSIS — G894 Chronic pain syndrome: Secondary | ICD-10-CM

## 2020-11-05 MED ORDER — BACLOFEN 20 MG PO TABS
40.0000 mg | ORAL_TABLET | Freq: Four times a day (QID) | ORAL | 5 refills | Status: DC
Start: 2020-11-05 — End: 2021-01-17

## 2020-11-05 MED ORDER — TIZANIDINE HCL 4 MG PO TABS: 4.0000 mg | ORAL_TABLET | Freq: Two times a day (BID) | ORAL | 5 refills | Status: DC

## 2020-11-05 MED ORDER — SILDENAFIL CITRATE 100 MG PO TABS: 100.0000 mg | ORAL_TABLET | Freq: Every day | ORAL | 11 refills | Status: DC | PRN
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
## 2020-11-05 NOTE — Patient Instructions (Signed)
Pt is a 29 yr old male with T10 ASIA S paraplegia, neurogenic bowel and bladder and back pain here  For f/u- also had DVT and Pressure ulcer. Heterotopic ossification of L hip.   1.Spasticity-  Increase baclofen to 40 mg 4x/day. Is MAX dose for SCI pts-   2. Spasticity On second week, add Tizanidine 4 mg 2x/day-   3. Chronic pain Hasn't even picked up Oxycodone/Tramadol- was just approved- so will pick up today.   4. Erectile issues- Will try Viagra for intimacy- try 30 minutes prior to intimacy- lasts for 4 hrs - works better than Cialis-if you get Headache with it, call me and can do Cialis  5. Intimacy- practice alone, THEN with partner.  Discussed for 10 minutes alone.   6. If this doesn't work, call em and will send to Urology.   7. Continue doing pressure relief every 15 minutes to prevent worsening of pressure ulcer.   8. Heterotopic ossification- still take Diclofenac for L hip pain/reduced ROM- at 9 months, will send to Ortho surgery to get it looked at- .   9. F/U in 2 months- double visit- SCI

## 2020-11-11 LAB — DRUG TOX MONITOR 1 W/CONF, ORAL FLD
Amphetamines: NEGATIVE ng/mL (ref <10)
Barbiturates: NEGATIVE ng/mL (ref <10)
Benzodiazepines: NEGATIVE ng/mL (ref <0.50)
Buprenorphine: NEGATIVE ng/mL (ref <0.10)
Cocaine: NEGATIVE ng/mL (ref <5.0)
Cotinine: >250 ng/mL — ABNORMAL HIGH (ref <5.0)
Fentanyl: NEGATIVE ng/mL (ref <0.10)
Heroin Metabolite: NEGATIVE ng/mL (ref <1.0)
MARIJUANA: POSITIVE ng/mL — AB (ref <2.5)
MDMA: NEGATIVE ng/mL (ref <10)
Meprobamate: NEGATIVE ng/mL (ref <2.5)
Methadone: NEGATIVE ng/mL (ref <5.0)
Nicotine Metabolite: POSITIVE ng/mL — AB (ref <5.0)
Opiates: NEGATIVE ng/mL (ref <2.5)
Phencyclidine: NEGATIVE ng/mL (ref <10)
THC: 3.2 ng/mL — ABNORMAL HIGH (ref <2.5)
Tapentadol: NEGATIVE ng/mL (ref <5.0)
Tramadol: >500 ng/mL — ABNORMAL HIGH (ref <5.0)
Tramadol: POSITIVE ng/mL — AB (ref <5.0)
Zolpidem: NEGATIVE ng/mL (ref <5.0)

## 2020-11-11 LAB — DRUG TOX ALC METAB W/CON, ORAL FLD: Alcohol Metabolite: NEGATIVE ng/mL (ref <25)

## 2020-11-16 ENCOUNTER — Telehealth: Payer: Self-pay | Admitting: *Deleted

## 2020-11-16 NOTE — Telephone Encounter (Signed)
Oral swab was positive for tramadol. Last oxycodone was taken 10/29/20 per Mr Bachtel. There is a small amount of marijuana present.

## 2020-11-17 ENCOUNTER — Encounter (HOSPITAL_BASED_OUTPATIENT_CLINIC_OR_DEPARTMENT_OTHER): Payer: Medicaid Other | Admitting: Physician Assistant

## 2020-11-22 ENCOUNTER — Encounter (HOSPITAL_BASED_OUTPATIENT_CLINIC_OR_DEPARTMENT_OTHER): Payer: Medicaid Other | Admitting: Internal Medicine

## 2020-11-23 ENCOUNTER — Ambulatory Visit: Payer: Self-pay | Admitting: Family Medicine

## 2020-12-08 ENCOUNTER — Encounter (HOSPITAL_BASED_OUTPATIENT_CLINIC_OR_DEPARTMENT_OTHER): Payer: No Typology Code available for payment source | Admitting: Physician Assistant

## 2020-12-10 ENCOUNTER — Other Ambulatory Visit: Payer: Self-pay | Admitting: Physical Medicine and Rehabilitation

## 2020-12-10 ENCOUNTER — Telehealth: Payer: Self-pay | Admitting: *Deleted

## 2020-12-10 MED ORDER — OXYCODONE HCL 10 MG PO TABS: 10.0000 mg | ORAL_TABLET | Freq: Four times a day (QID) | ORAL | 0 refills | Status: DC | PRN

## 2020-12-10 MED ORDER — TRAMADOL HCL 50 MG PO TABS: 100.0000 mg | ORAL_TABLET | Freq: Three times a day (TID) | ORAL | 1 refills | Status: DC

## 2020-12-10 NOTE — Telephone Encounter (Signed)
Duplicate letter sent to [name] on 12/10/20 through [place] because he reports he did not receive.  It will be mailed through [employer] as well.

## 2020-12-10 NOTE — Telephone Encounter (Signed)
Mr Spoelstra called back about getting his medication. I discussed the letter and his multiple drug screens positive for marijuana.  He denies receiving the letter but points out he has been prescribed medication since June.

## 2020-12-10 NOTE — Telephone Encounter (Signed)
Mr [name] called for a refill on his oxycodone and tramadol.  I have called and left him a message to return my call to discuss with him that Dr Berline Chough is not in the office today and he was given a formal warning for having marijuana in his drug screens 05/18/20 and he has had 2 more positive tests since then. Will discuss with Dr Carlis Abbott how to proceed until Dr Berline Chough is back in the office.

## 2020-12-15 ENCOUNTER — Encounter (HOSPITAL_BASED_OUTPATIENT_CLINIC_OR_DEPARTMENT_OTHER): Payer: No Typology Code available for payment source | Admitting: Physician Assistant

## 2020-12-15 ENCOUNTER — Telehealth: Payer: Self-pay | Admitting: *Deleted

## 2020-12-15 MED ORDER — OXYCODONE HCL 10 MG PO TABS: 10.0000 mg | ORAL_TABLET | Freq: Four times a day (QID) | ORAL | 0 refills | Status: DC | PRN

## 2020-12-15 MED ORDER — TRAMADOL HCL 50 MG PO TABS: 100.0000 mg | ORAL_TABLET | Freq: Three times a day (TID) | ORAL | 5 refills | Status: DC

## 2020-12-15 NOTE — Telephone Encounter (Signed)
Notified. 

## 2020-12-15 NOTE — Telephone Encounter (Signed)
Mr Nay has called for refills on his oxycodone and tramadol.  Dr Carlis Abbott filled only for a week while Dr Berline Chough was out of the office. 12/10/2020 Oxycodone Hcl (Ir) 10 Mg Tab #28.00 7d Kr Raulkar  12/10/2020 Tramadol Hcl 50 Mg Tablet #21.00 5d Kr Raulkar  I discussed with him, when he called in last week, about the marijuana present in his drug screen and his warning letter written in June 2021.  He denied receiving it even though it was sent through [place] and [place] mail.  I resent the June letter to him by [facility] and [place] CERTIFIED mail.

## 2020-12-20 ENCOUNTER — Other Ambulatory Visit: Payer: Self-pay

## 2020-12-20 ENCOUNTER — Encounter: Payer: Self-pay | Admitting: Family Medicine

## 2020-12-20 ENCOUNTER — Ambulatory Visit (INDEPENDENT_AMBULATORY_CARE_PROVIDER_SITE_OTHER): Payer: Medicaid Other | Admitting: Family Medicine

## 2020-12-20 VITALS — BP 146/80 | HR 96

## 2020-12-20 DIAGNOSIS — G8220 Paraplegia, unspecified: Secondary | ICD-10-CM | POA: Diagnosis not present

## 2020-12-20 DIAGNOSIS — Y249XXA Unspecified firearm discharge, undetermined intent, initial encounter: Secondary | ICD-10-CM

## 2020-12-20 DIAGNOSIS — W3400XD Accidental discharge from unspecified firearms or gun, subsequent encounter: Secondary | ICD-10-CM | POA: Diagnosis not present

## 2020-12-20 DIAGNOSIS — Z09 Encounter for follow-up examination after completed treatment for conditions other than malignant neoplasm: Secondary | ICD-10-CM

## 2020-12-20 DIAGNOSIS — G4701 Insomnia due to medical condition: Secondary | ICD-10-CM

## 2020-12-20 DIAGNOSIS — W3400XA Accidental discharge from unspecified firearms or gun, initial encounter: Secondary | ICD-10-CM

## 2020-12-20 DIAGNOSIS — S24109D Unspecified injury at unspecified level of thoracic spinal cord, subsequent encounter: Secondary | ICD-10-CM | POA: Diagnosis not present

## 2020-12-20 DIAGNOSIS — K592 Neurogenic bowel, not elsewhere classified: Secondary | ICD-10-CM | POA: Diagnosis not present

## 2020-12-20 DIAGNOSIS — K5909 Other constipation: Secondary | ICD-10-CM

## 2020-12-20 DIAGNOSIS — N529 Male erectile dysfunction, unspecified: Secondary | ICD-10-CM

## 2020-12-20 DIAGNOSIS — N319 Neuromuscular dysfunction of bladder, unspecified: Secondary | ICD-10-CM

## 2020-12-20 MED ORDER — POLYETHYLENE GLYCOL 3350 17 GM/SCOOP PO POWD: 17.0000 g | Freq: Every day | ORAL | 3 refills | Status: DC

## 2020-12-20 MED ORDER — DOCUSATE SODIUM 50 MG PO CAPS: 50.0000 mg | ORAL_CAPSULE | Freq: Two times a day (BID) | ORAL | 3 refills | Status: DC

## 2020-12-20 NOTE — Progress Notes (Signed)
Has some constipation.
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
## 2020-12-21 ENCOUNTER — Encounter: Payer: Self-pay | Admitting: Family Medicine

## 2020-12-22 ENCOUNTER — Other Ambulatory Visit: Payer: Self-pay

## 2020-12-22 ENCOUNTER — Encounter (HOSPITAL_BASED_OUTPATIENT_CLINIC_OR_DEPARTMENT_OTHER): Payer: Medicaid Other | Attending: Internal Medicine | Admitting: Physician Assistant

## 2020-12-22 DIAGNOSIS — Z7901 Long term (current) use of anticoagulants: Secondary | ICD-10-CM | POA: Insufficient documentation

## 2020-12-22 DIAGNOSIS — L89520 Pressure ulcer of left ankle, unstageable: Secondary | ICD-10-CM | POA: Diagnosis not present

## 2020-12-22 DIAGNOSIS — Z86718 Personal history of other venous thrombosis and embolism: Secondary | ICD-10-CM | POA: Diagnosis not present

## 2020-12-22 DIAGNOSIS — E43 Unspecified severe protein-calorie malnutrition: Secondary | ICD-10-CM | POA: Insufficient documentation

## 2020-12-22 DIAGNOSIS — G8221 Paraplegia, complete: Secondary | ICD-10-CM | POA: Insufficient documentation

## 2020-12-22 DIAGNOSIS — L89322 Pressure ulcer of left buttock, stage 2: Secondary | ICD-10-CM | POA: Insufficient documentation

## 2020-12-22 DIAGNOSIS — M8668 Other chronic osteomyelitis, other site: Secondary | ICD-10-CM | POA: Insufficient documentation

## 2020-12-22 DIAGNOSIS — L89610 Pressure ulcer of right heel, unstageable: Secondary | ICD-10-CM | POA: Insufficient documentation

## 2020-12-22 DIAGNOSIS — L89154 Pressure ulcer of sacral region, stage 4: Secondary | ICD-10-CM | POA: Insufficient documentation

## 2020-12-22 NOTE — Progress Notes (Addendum)
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 Pre-Procedure Plan Follow-up Appointments: Return Appointment in 2 weeks. Bathing/ Shower/ Hygiene: May shower with protection but do not get wound dressing(s) wet. Off-Loading: Turn and reposition every 2 hours - be sure to lift up with arms every hour while in wheelchair The following medication(s) was prescribed: Santyl topical 250 unit/gram ointment ointment topical Apply nickel thick daily to the wound bed and then cover with a dressing as directed in clinic x 30 days starting 12/22/2020 WOUND #3: - Malleolus Wound Laterality: Left, Lateral Cleanser: Normal Saline 1 x Per Day/30 Days Discharge Instructions: Cleanse the wound with Normal Saline prior to applying a clean dressing using gauze sponges, not tissue or cotton balls. Prim Dressing: Santyl Ointment 1 x Per Day/30 Days ary Discharge Instructions: Apply nickel thick amount to wound bed as instructed Secondary Dressing: Bordered Gauze, 4x4 in (DME) (Generic) 1 x Per Day/30  Days Discharge Instructions: Apply over primary dressing as directed. WOUND #4: - Sacrum Wound Laterality: Prim Dressing: Promogran Prisma Matrix, 4.34 (sq in) (silver collagen) (DME) (Generic) Every Other Day/30 Days ary Discharge Instructions: to line wound bed, then pack with saline moistened gauze behind collagen Secondary Dressing: ABD Pad, 5x9 (DME) Every Other Day/30 Days Discharge Instructions: Apply over primary dressing as directed. Secured With: 4M Medipore H Soft Cloth Surgical T 4 x 2 (in/yd) (DME) (Generic) Every Other Day/30 Days ape Discharge Instructions: Secure dressing with tape as directed. 1. Would recommend currently that we go ahead and initiate treatment with regard to the left ankle with Santyl I think this is going to be a good option for him in order  to try to help get the Ace wound a better spot to heal more effectively and quickly. 2. I am also can recommend at this time that the patient continue with Prisma for the sacral region to try to help with granulation and epithelization. 3. He should continue with offloading I do think the air mattress is still appropriate and I would recommend that we continue as such with this. 4. I would also recommend that he continue to try to avoid sitting for long periods of time also avoiding any pressure to the ankle region is ideal. We will see patient back for reevaluation in 1 week here in the clinic. If anything worsens or changes patient will contact our office for additional recommendations. Electronic Signature(s) Signed: 12/22/2020 1:45:03 PM By: Lenda Kelp PA-C Previous Signature: 12/22/2020 1:43:27 PM Version By: Lenda Kelp PA-C Entered By: Lenda Kelp on 12/22/2020 13:45:03 -------------------------------------------------------------------------------- SuperBill Details Patient Name: Date of Service: [name] 12/22/2020 Medical Record Number: [id number] Patient Account Number:  [id number] Date of Birth/Sex: Treating RN: 25-Jul-1991 (29 y.o. 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

## 2020-12-22 NOTE — Progress Notes (Signed)
[name] ([id number]) Visit Report for 12/22/2020 Arrival Information Details Patient Name: Date of Service: [name], [name] 12/22/2020 12:30 PM Medical Record Number: [id number] Patient Account Number: [id number] Date of Birth/Sex: Treating RN: 1991-10-07 (30 y.o. [name], [name] Primary Care Marciana Uplinger: [name] Other Clinician: Referring Tamula Morrical: Treating Alfio Loescher/Extender: [name] in Treatment: 20 Visit Information History Since Last Visit Added or deleted any medications: No Patient Arrived: Wheel Chair Any new allergies or adverse reactions: No Arrival Time: 12:59 Had a fall or experienced change in No Accompanied By: self activities of daily living that may affect Transfer Assistance: None risk of falls: Patient Identification Verified: Yes Signs or symptoms of abuse/neglect since last visito No Secondary Verification Process Completed: Yes Hospitalized since last visit: No Patient Requires Transmission-Based Precautions: No Implantable device outside of the clinic excluding No Patient Has Alerts: No cellular tissue based products placed in the center since last visit: Has Dressing in Place as Prescribed: Yes Pain Present Now: No Electronic Signature(s) Signed: 12/22/2020 6:11:14 PM By: Shawn Stall Entered By: Shawn Stall on 12/22/2020 13:03:39 -------------------------------------------------------------------------------- Encounter Discharge Information Details Patient Name: Date of Service: [name] March L. 12/22/2020 12:30 PM Medical Record Number: [id number] Patient Account Number: [id number] Date of Birth/Sex: Treating RN: 1991/11/15 (30 y.o. Charlean Merl, Lauren Primary Care Tarahji Ramthun: [name] Other Clinician: Referring Zienna Ahlin: Treating Imad Shostak/Extender: [name] in Treatment: 20 Encounter Discharge Information Items Post Procedure Vitals Discharge Condition:  Stable Temperature (F): 98 Ambulatory Status: Wheelchair Pulse (bpm): 74 Discharge Destination: Home Respiratory Rate (breaths/min): 17 Transportation: Other Blood Pressure (mmHg): 134/74 Accompanied By: self Schedule Follow-up Appointment: Yes Clinical Summary of Care: Patient Declined Electronic Signature(s) Signed: 12/22/2020 5:56:51 PM By: Fonnie Mu RN Entered By: Fonnie Mu on 12/22/2020 13:55:38 -------------------------------------------------------------------------------- Lower Extremity Assessment Details Patient Name: Date of Service: [name], [name] 12/22/2020 12:30 PM Medical Record Number: [id number] Patient Account Number: [id number] Date of Birth/Sex: Treating RN: 05-04-1991 (30 y.o. [name] Primary Care Raziah Funnell: [name] Other Clinician: Referring Azilee Pirro: Treating [place] Flight/Extender: Angus Palms Weeks in Treatment: 20 Edema Assessment Assessed: [Left: Yes] [Right: No] Edema: [Left: Yes] [Right: No] Calf Left: Right: Point of Measurement: 47 cm From Medial Instep 26 cm Ankle Left: Right: Point of Measurement: 12 cm From Medial Instep 20 cm Vascular Assessment Pulses: Dorsalis Pedis Palpable: [Left:Yes] Electronic Signature(s) Signed: 12/22/2020 6:11:14 PM By: Shawn Stall Entered By: Shawn Stall on 12/22/2020 13:14:44 -------------------------------------------------------------------------------- Multi Wound Chart Details Patient Name: Date of Service: [name] March L. 12/22/2020 12:30 PM Medical Record Number: [id number] Patient Account Number: [id number] Date of Birth/Sex: Treating RN: 1991-06-30 (30 y.o. [name] Primary Care Nesha Counihan: [name] Other Clinician: Referring Jaxsun Ciampi: Treating Lavon Bothwell/Extender: Angus Palms Weeks in Treatment: 20 Vital Signs Height(in): 71 Pulse(bpm): 72 Weight(lbs): 136 Blood Pressure(mmHg): 144/85 Body Mass  Index(BMI): 19 Temperature(F): 98.1 Respiratory Rate(breaths/min): 16 Photos: [3:No Photos Left, Lateral Malleolus] [4:No Photos Sacrum] [N/A:N/A N/A] Wound Location: [3:Gradually Appeared] [4:Gradually Appeared] [N/A:N/A] Wounding Event: [3:Pressure Ulcer] [4:Pressure Ulcer] [N/A:N/A] Primary Etiology: [3:Paraplegia] [4:Paraplegia] [N/A:N/A] Comorbid History: [3:06/27/2020] [4:05/27/2020] [N/A:N/A] Date Acquired: [3:20] [4:20] [N/A:N/A] Weeks of Treatment: [3:Open] [4:Open] [N/A:N/A] Wound Status: [3:1.4x2.3x0.7] [4:4.2x2x1.1] [N/A:N/A] Measurements L x W x D (cm) [3:2.529] [4:6.597] [N/A:N/A] A (cm) : rea [3:1.77] [4:7.257] [N/A:N/A] Volume (cm) : [3:30.00%] [4:53.30%] [N/A:N/A] % Reduction in A rea: [3:18.40%] [4:73.00%] [N/A:N/A] % Reduction in Volume: [3:Category/Stage IV] [4:Category/Stage IV] [N/A:N/A] Classification: [3:Medium] [  4:Large] [N/A:N/A] Exudate A mount: [3:Serosanguineous] [4:Serosanguineous] [N/A:N/A] Exudate Type: [3:red, brown] [4:red, brown] [N/A:N/A] Exudate Color: [3:Epibole] [4:Well defined, not attached] [N/A:N/A] Wound Margin: [3:None Present (0%)] [4:Large (67-100%)] [N/A:N/A] Granulation A mount: [3:N/A] [4:Pink, Pale] [N/A:N/A] Granulation Quality: [3:Large (67-100%)] [4:Small (1-33%)] [N/A:N/A] Necrotic A mount: [3:Fat Layer (Subcutaneous Tissue): Yes Fat Layer (Subcutaneous Tissue): Yes N/A] Exposed Structures: [3:Tendon: Yes Bone: Yes Fascia: No Muscle: No Joint: No Small (1-33%)] [4:Bone: Yes Fascia: No Tendon: No Muscle: No Joint: No Large (67-100%)] [N/A:N/A] Epithelialization: [3:Chemical/Enzymatic/Mechanical] [4:N/A] [N/A:N/A] Debridement: Pre-procedure Verification/Time Out 13:26 [4:N/A] [N/A:N/A] Taken: [3:N/A] [4:N/A] [N/A:N/A] Instrument: [3:None] [4:N/A] [N/A:N/A] Bleeding: [3:Procedure was tolerated well] [4:N/A] [N/A:N/A] Debridement Treatment Response: [3:1.4x2.3x0.7] [4:N/A] [N/A:N/A] Post Debridement Measurements L x W x D (cm)  [3:1.77] [4:N/A] [N/A:N/A] Post Debridement Volume: (cm) [3:Category/Stage IV] [4:N/A] [N/A:N/A] Post Debridement Stage: [3:Debridement] [4:N/A] [N/A:N/A] Treatment Notes Wound #3 (Malleolus) Wound Laterality: Left, Lateral Cleanser Normal Saline Discharge Instruction: Cleanse the wound with Normal Saline prior to applying a clean dressing using gauze sponges, not tissue or cotton balls. Peri-Wound Care Topical Primary Dressing Santyl Ointment Discharge Instruction: Apply nickel thick amount to wound bed as instructed Secondary Dressing Bordered Gauze, 4x4 in Discharge Instruction: Apply over primary dressing as directed. Secured With Compression Wrap Compression Stockings Add-Ons Wound #4 (Sacrum) Cleanser Peri-Wound Care Topical Primary Dressing Promogran Prisma Matrix, 4.34 (sq in) (silver collagen) Discharge Instruction: to line wound bed, then pack with saline moistened gauze behind collagen Secondary Dressing ABD Pad, 5x9 Discharge Instruction: Apply over primary dressing as directed. Secured With 48M Medipore H Soft Cloth Surgical T 4 x 2 (in/yd) ape Discharge Instruction: Secure dressing with tape as directed. Compression Wrap Compression Stockings Add-Ons Electronic Signature(s) Signed: 12/22/2020 6:09:51 PM By: Zenaida Deed RN, BSN Entered By: Zenaida Deed on 12/22/2020 14:27:50 -------------------------------------------------------------------------------- Multi-Disciplinary Care Plan Details Patient Name: Date of Service: DENZAL, MEIR NIO L. 12/22/2020 12:30 PM Medical Record Number: [id number] Patient Account Number: [id number] Date of Birth/Sex: Treating RN: 14-Jun-1991 (29 y.o. [name] Primary Care Soraiya Ahner: [name] Other Clinician: Referring Lisel Siegrist: Treating Mishawn Hemann/Extender: Angus Palms Weeks in Treatment: 20 Active Inactive Pressure Nursing Diagnoses: Knowledge deficit related to causes and risk  factors for pressure ulcer development Goals: Patient/caregiver will verbalize risk factors for pressure ulcer development Date Initiated: 07/30/2020 Target Resolution Date: 01/19/2021 Goal Status: Active Interventions: Provide education on pressure ulcers Notes: Electronic Signature(s) Signed: 12/22/2020 6:09:51 PM By: Zenaida Deed RN, BSN Entered By: Zenaida Deed on 12/22/2020 13:22:49 -------------------------------------------------------------------------------- Pain Assessment Details Patient Name: Date of Service: [name] March L. 12/22/2020 12:30 PM Medical Record Number: [id number] Patient Account Number: [id number] Date of Birth/Sex: Treating RN: May 06, 1991 (29 y.o. [name] Primary Care Leara Rawl: [name] Other Clinician: Referring Kimia Finan: Treating Millee Denise/Extender: [name] in Treatment: 20 Active Problems Location of Pain Severity and Description of Pain Patient Has Paino No Site Locations Rate the pain. Rate the pain. Current Pain Level: 0 Pain Management and Medication Current Pain Management: Medication: No Cold Application: No Rest: No Massage: No Activity: No T.E.N.S.: No Heat Application: No Leg drop or elevation: No Is the Current Pain Management Adequate: Adequate How does your wound impact your activities of daily livingo Sleep: No Bathing: No Appetite: No Relationship With Others: No Bladder Continence: No Emotions: No Bowel Continence: No Work: No Toileting: No Drive: No Dressing: No Hobbies: No Electronic Signature(s) Signed: 12/22/2020 6:11:14 PM By: Shawn Stall Entered By: Shawn Stall on 12/22/2020 13:04:51 -------------------------------------------------------------------------------- Patient/Caregiver  Education Details Patient Name: Date of Service: ARYAN, SPARKS 1/26/2022andnbsp12:30 PM Medical Record Number: [id number] Patient Account Number: [id number] Date  of Birth/Gender: Treating RN: [birth year] (29 y.o. [name] Primary Care Physician: [name] Other Clinician: Referring Physician: Treating Physician/Extender: [name] in Treatment: 20 Education Assessment Education Provided To: Patient Education Topics Provided Pressure: Methods: Explain/Verbal Responses: Reinforcements needed, State content correctly Wound/Skin Impairment: Methods: Explain/Verbal Responses: Reinforcements needed, State content correctly Electronic Signature(s) Signed: 12/22/2020 6:09:51 PM By: Zenaida Deed RN, BSN Signed: 12/22/2020 6:09:51 PM By: Zenaida Deed RN, BSN Entered By: Zenaida Deed on 12/22/2020 13:24:03 -------------------------------------------------------------------------------- Wound Assessment Details Patient Name: Date of Service: FARAH, BENISH NIO L. 12/22/2020 12:30 PM Medical Record Number: [id number] Patient Account Number: [id number] Date of Birth/Sex: Treating RN: Jan 29, 1991 (29 y.o. [name] Primary Care Calven Gilkes: [name] Other Clinician: Referring Khadir Roam: Treating Janyra Barillas/Extender: Angus Palms Weeks in Treatment: 20 Wound Status Wound Number: 3 Primary Etiology: Pressure Ulcer Wound Location: Left, Lateral Malleolus Wound Status: Open Wounding Event: Gradually Appeared Comorbid History: Paraplegia Date Acquired: 06/27/2020 Weeks Of Treatment: 20 Clustered Wound: No Wound Measurements Length: (cm) 1.4 Width: (cm) 2.3 Depth: (cm) 0.7 Area: (cm) 2.529 Volume: (cm) 1.77 % Reduction in Area: 30% % Reduction in Volume: 18.4% Epithelialization: Small (1-33%) Tunneling: No Undermining: No Wound Description Classification: Category/Stage IV Wound Margin: Epibole Exudate Amount: Medium Exudate Type: Serosanguineous Exudate Color: red, brown Foul Odor After Cleansing: No Slough/Fibrino Yes Wound Bed Granulation Amount: None  Present (0%) Exposed Structure Necrotic Amount: Large (67-100%) Fascia Exposed: No Necrotic Quality: Adherent Slough Fat Layer (Subcutaneous Tissue) Exposed: Yes Tendon Exposed: Yes Muscle Exposed: No Joint Exposed: No Bone Exposed: Yes Treatment Notes Wound #3 (Malleolus) Wound Laterality: Left, Lateral Cleanser Normal Saline Discharge Instruction: Cleanse the wound with Normal Saline prior to applying a clean dressing using gauze sponges, not tissue or cotton balls. Peri-Wound Care Topical Primary Dressing Santyl Ointment Discharge Instruction: Apply nickel thick amount to wound bed as instructed Secondary Dressing Bordered Gauze, 4x4 in Discharge Instruction: Apply over primary dressing as directed. Secured With Compression Wrap Compression Stockings Facilities manager) Signed: 12/22/2020 6:11:14 PM By: Shawn Stall Entered By: Shawn Stall on 12/22/2020 13:15:14 -------------------------------------------------------------------------------- Wound Assessment Details Patient Name: Date of Service: JETER, TOMEY 12/22/2020 12:30 PM Medical Record Number: [id number] Patient Account Number: [id number] Date of Birth/Sex: Treating RN: 01/14/1991 (29 y.o. [name] Primary Care Philis Doke: [name] Other Clinician: Referring Afton Lavalle: Treating Colden Samaras/Extender: Angus Palms Weeks in Treatment: 20 Wound Status Wound Number: 4 Primary Etiology: Pressure Ulcer Wound Location: Sacrum Wound Status: Open Wounding Event: Gradually Appeared Comorbid History: Paraplegia Date Acquired: 05/27/2020 Weeks Of Treatment: 20 Clustered Wound: No Wound Measurements Length: (cm) 4.2 Width: (cm) 2 Depth: (cm) 1.1 Area: (cm) 6.597 Volume: (cm) 7.257 % Reduction in Area: 53.3% % Reduction in Volume: 73% Epithelialization: Large (67-100%) Tunneling: No Undermining: No Wound Description Classification: Category/Stage IV Wound  Margin: Well defined, not attached Exudate Amount: Large Exudate Type: Serosanguineous Exudate Color: red, brown Foul Odor After Cleansing: No Slough/Fibrino Yes Wound Bed Granulation Amount: Large (67-100%) Exposed Structure Granulation Quality: Pink, Pale Fascia Exposed: No Necrotic Amount: Small (1-33%) Fat Layer (Subcutaneous Tissue) Exposed: Yes Necrotic Quality: Adherent Slough Tendon Exposed: No Muscle Exposed: No Joint Exposed: No Bone Exposed: Yes Treatment Notes Wound #4 (Sacrum) Cleanser Peri-Wound Care Topical Primary Dressing Promogran Prisma Matrix, 4.34 (sq in) (silver collagen) Discharge Instruction: to  line wound bed, then pack with saline moistened gauze behind collagen Secondary Dressing ABD Pad, 5x9 Discharge Instruction: Apply over primary dressing as directed. Secured With 48M Medipore H Soft Cloth Surgical T 4 x 2 (in/yd) ape Discharge Instruction: Secure dressing with tape as directed. Compression Wrap Compression Stockings Add-Ons Electronic Signature(s) Signed: 12/22/2020 6:11:14 PM By: Shawn Stall Entered By: Shawn Stall on 12/22/2020 13:16:17 -------------------------------------------------------------------------------- Vitals Details Patient Name: Date of Service: [name] March L. 12/22/2020 12:30 PM Medical Record Number: [id number] Patient Account Number: [id number] Date of Birth/Sex: Treating RN: 1991/10/02 (29 y.o. [name] Primary Care Terissa Haffey: [name] Other Clinician: Referring Myrtle Haller: Treating Lennard Capek/Extender: Angus Palms Weeks in Treatment: 20 Vital Signs Time Taken: 12:59 Temperature (F): 98.1 Height (in): 71 Pulse (bpm): 72 Weight (lbs): 136 Respiratory Rate (breaths/min): 16 Body Mass Index (BMI): 19 Blood Pressure (mmHg): 144/85 Reference Range: 80 - 120 mg / dl Electronic Signature(s) Signed: 12/22/2020 6:11:14 PM By: Shawn Stall Entered By: Shawn Stall on  12/22/2020 13:04:42

## 2020-12-28 ENCOUNTER — Encounter: Payer: Self-pay | Admitting: Family Medicine

## 2021-01-05 ENCOUNTER — Encounter (HOSPITAL_BASED_OUTPATIENT_CLINIC_OR_DEPARTMENT_OTHER): Payer: Medicaid Other | Attending: Physician Assistant | Admitting: Physician Assistant

## 2021-01-05 ENCOUNTER — Other Ambulatory Visit: Payer: Self-pay

## 2021-01-05 DIAGNOSIS — Z681 Body mass index (BMI) 19 or less, adult: Secondary | ICD-10-CM | POA: Insufficient documentation

## 2021-01-05 DIAGNOSIS — L89159 Pressure ulcer of sacral region, unspecified stage: Secondary | ICD-10-CM | POA: Diagnosis present

## 2021-01-05 DIAGNOSIS — L89322 Pressure ulcer of left buttock, stage 2: Secondary | ICD-10-CM | POA: Diagnosis not present

## 2021-01-05 DIAGNOSIS — M8668 Other chronic osteomyelitis, other site: Secondary | ICD-10-CM | POA: Diagnosis not present

## 2021-01-05 DIAGNOSIS — G8221 Paraplegia, complete: Secondary | ICD-10-CM | POA: Diagnosis not present

## 2021-01-05 DIAGNOSIS — L89154 Pressure ulcer of sacral region, stage 4: Secondary | ICD-10-CM | POA: Insufficient documentation

## 2021-01-05 DIAGNOSIS — F17200 Nicotine dependence, unspecified, uncomplicated: Secondary | ICD-10-CM | POA: Diagnosis not present

## 2021-01-05 DIAGNOSIS — L89523 Pressure ulcer of left ankle, stage 3: Secondary | ICD-10-CM | POA: Insufficient documentation

## 2021-01-05 DIAGNOSIS — L89610 Pressure ulcer of right heel, unstageable: Secondary | ICD-10-CM | POA: Insufficient documentation

## 2021-01-05 DIAGNOSIS — E43 Unspecified severe protein-calorie malnutrition: Secondary | ICD-10-CM | POA: Diagnosis not present

## 2021-01-05 NOTE — Progress Notes (Addendum)
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 tendon, and Fat Layer (Subcutaneous Tissue) exposed. There is no tunneling or undermining noted. There is a medium amount of serosanguineous drainage noted. The wound margin is epibole. There is no granulation within the wound bed. There is a large (67-100%) amount of necrotic tissue within the wound bed including Adherent Slough. Wound #4 status is Open. Original cause of wound was Gradually Appeared. The wound is located on the Sacrum. The wound measures 4cm length x 1cm width x 1.1cm depth; 3.142cm^2 area and 3.456cm^3 volume. There is bone and Fat Layer (Subcutaneous Tissue) exposed. There is no tunneling or undermining noted. There is a large amount of serosanguineous drainage noted. The wound margin is well defined and not attached to the wound base. There is large (67-100%) pink, pale granulation within the wound bed. There is a small (1-33%) amount of necrotic tissue within the wound bed including Adherent Slough. Assessment Active Problems ICD-10 Pressure ulcer of  sacral region, stage 4 Pressure ulcer of left ankle, stage 3 Unspecified severe protein-calorie malnutrition Paraplegia, complete Other chronic osteomyelitis, other site Procedures Wound #3 Pre-procedure diagnosis of Wound #3 is a Pressure Ulcer located on the Left,Lateral Malleolus . There was a Excisional Skin/Subcutaneous Tissue Debridement with a total area of 1.7 sq cm performed by Lenda Kelp, PA. With the following instrument(s): Curette to remove Viable and Non-Viable tissue/material. Material removed includes Subcutaneous Tissue and Slough and. No specimens were taken. A time out  was conducted at 13:45, prior to the start of the procedure. A Minimum amount of bleeding was controlled with Pressure. The procedure was tolerated well with a pain level of Insensate throughout and a pain level of Insensate following the procedure. Post Debridement Measurements: 1cm length x 1.7cm width x 0.8cm depth; 1.068cm^3 volume. Post debridement Stage noted as Category/Stage IV. Character of Wound/Ulcer Post Debridement requires further debridement. Post procedure Diagnosis Wound #3: Same as Pre-Procedure Plan Follow-up Appointments: Return Appointment in 2 weeks. Bathing/ Shower/ Hygiene: May shower with protection but do not get wound dressing(s) wet. Off-Loading: Turn and reposition every 2 hours - be sure to lift up with arms every hour while in wheelchair Other: - pillows under left calf to keep pressure of left lateral ankle WOUND #3: - Malleolus Wound Laterality: Left, Lateral Cleanser: Normal Saline 1 x Per Day/30 Days Discharge Instructions: Cleanse the wound with Normal Saline prior to applying a clean dressing using gauze sponges, not tissue or cotton balls. Cleanser: Normal Saline 1 x Per Day/30 Days Discharge Instructions: Cleanse the wound with Normal Saline prior to applying a clean dressing using gauze sponges, not tissue or cotton balls. Prim Dressing: Santyl Ointment 1 x Per  Day/30 Days ary Discharge Instructions: Apply nickel thick amount to wound bed as instructed Secondary Dressing: ComfortFoam Border, 4x4 in (silicone border) 1 x Per Day/30 Days Discharge Instructions: Apply over primary dressing as directed. WOUND #4: - Sacrum Wound Laterality: Prim Dressing: Promogran Prisma Matrix, 4.34 (sq in) (silver collagen) (Generic) Every Other Day/30 Days ary Discharge Instructions: to line wound bed, then pack with saline moistened gauze behind collagen Secondary Dressing: ABD Pad, 5x9 Every Other Day/30 Days Discharge Instructions: Apply over primary dressing or bordered foam Secured With: 25M Medipore H Soft Cloth Surgical T 4 x 2 (in/yd) (Generic) Every Other Day/30 Days ape Discharge Instructions: Secure dressing with tape as directed. 1. I would recommend currently that we go ahead and continue with the wound care measures as before specifically with regard to the collagen for the sacral region I think this is good. 2. I am also can recommend that we go ahead and utilize Santyl for the ankle region he is going to call us about getting that today. 3. I am also can recommend the patient continue to offload both in regard to the sacral region as well as in regard to his ankle region. I think both are of concern here. He is doing a pretty good job in my opinion. We will see patient back for reevaluation in 2 weeks here in the clinic. If anything worsens or changes patient will contact our office for additional recommendations. Electronic Signature(s) Signed: 01/05/2021 1:59:14 PM By: Lenda KelpStone III, Karem Farha PA-C Entered By: Lenda KelpStone III, Elyas Villamor on 01/05/2021 13:59:14 -------------------------------------------------------------------------------- SuperBill Details Patient Name: Date of Service: Lauro RegulusUSSELL, [name] 01/05/2021 Medical Record Number: [id number] Patient Account Number: [id number] Date of Birth/Sex: Treating RN: 04/30/1991 (29 y.o. [name]) Boehlein, Linda Primary Care  Provider: Raliegh IpStroud, [name] Other Clinician: Referring Provider: Treating Provider/Extender: Angus PalmsStone III, Vincenza Dail [name], [name] Weeks in Treatment: 22 Diagnosis Coding ICD-10 Codes Code Description L89.154 Pressure ulcer of sacral region, stage 4 L89.523 Pressure ulcer of left ankle, stage 3 E43 Unspecified severe protein-calorie malnutrition G82.21 Paraplegia, complete M86.68 Other chronic osteomyelitis, other site Facility Procedures CPT4 Code: [id number] Description: 11042 - DEB SUBQ TISSUE 20 SQ CM/< ICD-10 Diagnosis Description L89.523 Pressure ulcer of left ankle, stage 3 Modifier: Quantity: 1 Physician Procedures : CPT4 Code Description Modifier [id number]  11042 - WC PHYS SUBQ TISS 20 SQ CM ICD-10 Diagnosis Description L89.523 Pressure ulcer of left ankle, stage 3 Quantity: 1 Electronic Signature(s) Signed: 01/05/2021 1:59:25 PM By: Lenda Kelp PA-C Entered By: Lenda Kelp on 01/05/2021 13:59:25

## 2021-01-06 NOTE — Progress Notes (Signed)
[name], [name] ([id number]) Visit Report for 01/05/2021 Arrival Information Details Patient Name: Date of Service: [name], [name] 01/05/2021 12:30 PM Medical Record Number: [id number] Patient Account Number: [id number] Date of Birth/Sex: Treating RN: [birth year] (30 y.o. Bayard Hugger, Bonita Quin Primary Care Shantea Poulton: [name] Other Clinician: Referring Chasty Randal: Treating Danira Nylander/Extender: [name] in Treatment: 22 Visit Information History Since Last Visit Added or deleted any medications: No Patient Arrived: Wheel Chair Any new allergies or adverse reactions: No Arrival Time: 12:59 Had a fall or experienced change in No Accompanied By: self activities of daily living that may affect Transfer Assistance: None risk of falls: Patient Identification Verified: Yes Signs or symptoms of abuse/neglect since last visito No Secondary Verification Process Completed: Yes Hospitalized since last visit: No Patient Requires Transmission-Based Precautions: No Implantable device outside of the clinic excluding No Patient Has Alerts: No cellular tissue based products placed in the center since last visit: Has Dressing in Place as Prescribed: Yes Pain Present Now: No Electronic Signature(s) Signed: 01/06/2021 8:34:45 AM By: [name] Ito Entered By: [name] Ito on 01/05/2021 13:00:00 -------------------------------------------------------------------------------- Encounter Discharge Information Details Patient Name: Date of Service: [name] March L. 01/05/2021 12:30 PM Medical Record Number: [id number] Patient Account Number: [id number] Date of Birth/Sex: Treating RN: 20-Mar-1991 (30 y.o. Charlean Merl, Lauren Primary Care Maizy Davanzo: [name] Other Clinician: Referring Alesana Magistro: Treating Fabiana Dromgoole/Extender: [name] in Treatment: 22 Encounter Discharge Information Items Post Procedure Vitals Discharge Condition:  Stable Temperature (F): 98.5 Ambulatory Status: Wheelchair Pulse (bpm): 89 Discharge Destination: Home Respiratory Rate (breaths/min): 17 Transportation: Private Auto Blood Pressure (mmHg): 123/83 Accompanied By: self Schedule Follow-up Appointment: Yes Clinical Summary of Care: Patient Declined Electronic Signature(s) Signed: 01/05/2021 5:02:07 PM By: Fonnie Mu RN Entered By: Fonnie Mu on 01/05/2021 13:56:20 -------------------------------------------------------------------------------- Lower Extremity Assessment Details Patient Name: Date of Service: [name], [name] L. 01/05/2021 12:30 PM Medical Record Number: [id number] Patient Account Number: [id number] Date of Birth/Sex: Treating RN: [birth year] (30 y.o. Charlean Merl, Lauren Primary Care Bethan Adamek: [name] Other Clinician: Referring Arrianna Catala: Treating Janiyha Montufar/Extender: Angus Palms Weeks in Treatment: 22 Edema Assessment Assessed: [name]: Yes] Franne Forts: No] Edema: [Left: Yes] [Right: No] Calf Left: Right: Point of Measurement: 47 cm From Medial Instep 27 cm Ankle Left: Right: Point of Measurement: 12 cm From Medial Instep 21 cm Vascular Assessment Pulses: Dorsalis Pedis Palpable: [Left:Yes] Posterior Tibial Palpable: [Left:Yes] Electronic Signature(s) Signed: 01/05/2021 5:02:07 PM By: Fonnie Mu RN Entered By: Fonnie Mu on 01/05/2021 13:08:23 -------------------------------------------------------------------------------- Multi-Disciplinary Care Plan Details Patient Name: Date of Service: [name] March L. 01/05/2021 12:30 PM Medical Record Number: [id number] Patient Account Number: [id number] Date of Birth/Sex: Treating RN: 1991-03-11 (30 y.o. [name] Primary Care Undra Trembath: [name] Other Clinician: Referring Pennelope Basque: Treating Dierks Wach/Extender: [name] in Treatment: 22 Active  Inactive Pressure Nursing Diagnoses: Knowledge deficit related to causes and risk factors for pressure ulcer development Goals: Patient/caregiver will verbalize risk factors for pressure ulcer development Date Initiated: 07/30/2020 Target Resolution Date: 01/19/2021 Goal Status: Active Interventions: Provide education on pressure ulcers Notes: Wound/Skin Impairment Nursing Diagnoses: Impaired tissue integrity Goals: Patient/caregiver will verbalize understanding of skin care regimen Date Initiated: 01/05/2021 Target Resolution Date: 02/02/2021 Goal Status: Active Ulcer/skin breakdown will have a volume reduction of 50% by week 8 Date Initiated: 07/30/2020 Date Inactivated: 10/20/2020 Target Resolution Date: 09/27/2020 Goal Status: Unmet Unmet Reason: Infection Ulcer/skin breakdown will have a volume reduction of 80%  by week 12 Date Initiated: 10/20/2020 Date Inactivated: 12/22/2020 Target Resolution Date: 11/19/2020 Unmet Reason: paraplegic, difficulty Goal Status: Unmet offloading Interventions: Provide education on ulcer and skin care Notes: Electronic Signature(s) Signed: 01/05/2021 5:10:26 PM By: Zenaida Deed RN, BSN Entered By: Zenaida Deed on 01/05/2021 13:11:22 -------------------------------------------------------------------------------- Pain Assessment Details Patient Name: Date of Service: [name] March L. 01/05/2021 12:30 PM Medical Record Number: [id number] Patient Account Number: [id number] Date of Birth/Sex: Treating RN: 1991-08-03 (30 y.o. [name] Primary Care Swara Donze: [name] Other Clinician: Referring Erminie Foulks: Treating [place] Schreiner/Extender: Angus Palms Weeks in Treatment: 22 Active Problems Location of Pain Severity and Description of Pain Patient Has Paino No Site Locations Pain Management and Medication Current Pain Management: Electronic Signature(s) Signed: 01/05/2021 5:10:26 PM By: Zenaida Deed RN,  BSN Signed: 01/06/2021 8:34:45 AM By: [name] Ito Signed: 01/06/2021 8:34:45 AM By: [name] Ito Entered By: [name] Ito on 01/05/2021 13:00:30 -------------------------------------------------------------------------------- Patient/Caregiver Education Details Patient Name: Date of Service: [name] 2/9/2022andnbsp12:30 PM Medical Record Number: [id number] Patient Account Number: [id number] Date of Birth/Gender: Treating RN: February 08, 1991 (29 y.o. [name] Primary Care Physician: [name] Other Clinician: Referring Physician: Treating Physician/Extender: [name] in Treatment: 22 Education Assessment Education Provided To: Patient Education Topics Provided Pressure: Methods: Explain/Verbal Responses: Reinforcements needed, State content correctly Wound/Skin Impairment: Methods: Explain/Verbal Responses: Reinforcements needed, State content correctly Electronic Signature(s) Signed: 01/05/2021 5:10:26 PM By: Zenaida Deed RN, BSN Entered By: Zenaida Deed on 01/05/2021 13:11:57 -------------------------------------------------------------------------------- Wound Assessment Details Patient Name: Date of Service: [name], [name] L. 01/05/2021 12:30 PM Medical Record Number: [id number] Patient Account Number: [id number] Date of Birth/Sex: Treating RN: 1991/02/03 (29 y.o. Charlean Merl, Lauren Primary Care Yousaf Sainato: [name] Other Clinician: Referring Emrys Mckamie: Treating Shermika Balthaser/Extender: Angus Palms Weeks in Treatment: 22 Wound Status Wound Number: 3 Primary Etiology: Pressure Ulcer Wound Location: Left, Lateral Malleolus Wound Status: Open Wounding Event: Gradually Appeared Comorbid History: Paraplegia Date Acquired: 06/27/2020 Weeks Of Treatment: 22 Clustered Wound: No Wound Measurements Length: (cm) 1 Width: (cm) 1.7 Depth: (cm) 0.8 Area: (cm) 1.335 Volume: (cm)  1.068 % Reduction in Area: 63.1% % Reduction in Volume: 50.7% Epithelialization: Small (1-33%) Tunneling: No Undermining: No Wound Description Classification: Category/Stage IV Wound Margin: Epibole Exudate Amount: Medium Exudate Type: Serosanguineous Exudate Color: red, brown Foul Odor After Cleansing: No Slough/Fibrino Yes Wound Bed Granulation Amount: None Present (0%) Exposed Structure Necrotic Amount: Large (67-100%) Fascia Exposed: No Necrotic Quality: Adherent Slough Fat Layer (Subcutaneous Tissue) Exposed: Yes Tendon Exposed: Yes Muscle Exposed: No Joint Exposed: No Bone Exposed: Yes Treatment Notes Wound #3 (Malleolus) Wound Laterality: Left, Lateral Cleanser Normal Saline Discharge Instruction: Cleanse the wound with Normal Saline prior to applying a clean dressing using gauze sponges, not tissue or cotton balls. Normal Saline Discharge Instruction: Cleanse the wound with Normal Saline prior to applying a clean dressing using gauze sponges, not tissue or cotton balls. Peri-Wound Care Topical Primary Dressing Santyl Ointment Discharge Instruction: Apply nickel thick amount to wound bed as instructed Secondary Dressing ComfortFoam Border, 4x4 in (silicone border) Discharge Instruction: Apply over primary dressing as directed. Secured With Compression Wrap Compression Stockings Facilities manager) Signed: 01/05/2021 5:02:07 PM By: Fonnie Mu RN Entered By: Fonnie Mu on 01/05/2021 13:08:45 -------------------------------------------------------------------------------- Wound Assessment Details Patient Name: Date of Service: [name], [name] L. 01/05/2021 12:30 PM Medical Record Number: [id number] Patient Account Number: [id number] Date of Birth/Sex: Treating RN: Oct 23, 1991 (29 y.o. Charlean Merl,  Lauren Primary Care [place] Espina: [name] Other Clinician: Referring Adan Beal: Treating Abbe Bula/Extender: Angus Palms Weeks in Treatment: 22 Wound Status Wound Number: 4 Primary Etiology: Pressure Ulcer Wound Location: Sacrum Wound Status: Open Wounding Event: Gradually Appeared Comorbid History: Paraplegia Date Acquired: 05/27/2020 Weeks Of Treatment: 22 Clustered Wound: No Wound Measurements Length: (cm) 4 Width: (cm) 1 Depth: (cm) 1.1 Area: (cm) 3.142 Volume: (cm) 3.456 % Reduction in Area: 77.8% % Reduction in Volume: 87.1% Epithelialization: Medium (34-66%) Tunneling: No Undermining: No Wound Description Classification: Category/Stage IV Wound Margin: Well defined, not attached Exudate Amount: Large Exudate Type: Serosanguineous Exudate Color: red, brown Foul Odor After Cleansing: No Slough/Fibrino Yes Wound Bed Granulation Amount: Large (67-100%) Exposed Structure Granulation Quality: Pink, Pale Fascia Exposed: No Necrotic Amount: Small (1-33%) Fat Layer (Subcutaneous Tissue) Exposed: Yes Necrotic Quality: Adherent Slough Tendon Exposed: No Muscle Exposed: No Joint Exposed: No Bone Exposed: Yes Treatment Notes Wound #4 (Sacrum) Cleanser Peri-Wound Care Topical Primary Dressing Promogran Prisma Matrix, 4.34 (sq in) (silver collagen) Discharge Instruction: to line wound bed, then pack with saline moistened gauze behind collagen Secondary Dressing ABD Pad, 5x9 Discharge Instruction: Apply over primary dressing or bordered foam Secured With 57M Medipore H Soft Cloth Surgical T 4 x 2 (in/yd) ape Discharge Instruction: Secure dressing with tape as directed. Compression Wrap Compression Stockings Add-Ons Electronic Signature(s) Signed: 01/05/2021 5:02:07 PM By: Fonnie Mu RN Entered By: Fonnie Mu on 01/05/2021 13:10:49 -------------------------------------------------------------------------------- Vitals Details Patient Name: Date of Service: [name] March L. 01/05/2021 12:30 PM Medical Record Number: [id number] Patient Account Number:  [id number] Date of Birth/Sex: Treating RN: 1991/10/14 (29 y.o. [name] Primary Care Samaa Ueda: [name] Other Clinician: Referring Tereso Unangst: Treating Aliah Eriksson/Extender: Angus Palms Weeks in Treatment: 22 Vital Signs Time Taken: 13:00 Temperature (F): 98.5 Height (in): 71 Pulse (bpm): 89 Weight (lbs): 136 Respiratory Rate (breaths/min): 16 Body Mass Index (BMI): 19 Blood Pressure (mmHg): 123/83 Reference Range: 80 - 120 mg / dl Electronic Signature(s) Signed: 01/06/2021 8:34:45 AM By: [name] Ito Entered By: [name] Ito on 01/05/2021 13:00:25

## 2021-01-07 ENCOUNTER — Other Ambulatory Visit: Payer: Self-pay

## 2021-01-07 ENCOUNTER — Encounter
Payer: Medicaid Other | Attending: Physical Medicine and Rehabilitation | Admitting: Physical Medicine and Rehabilitation

## 2021-01-07 DIAGNOSIS — G894 Chronic pain syndrome: Secondary | ICD-10-CM | POA: Insufficient documentation

## 2021-01-07 DIAGNOSIS — Z993 Dependence on wheelchair: Secondary | ICD-10-CM | POA: Insufficient documentation

## 2021-01-07 DIAGNOSIS — R29818 Other symptoms and signs involving the nervous system: Secondary | ICD-10-CM | POA: Insufficient documentation

## 2021-01-07 DIAGNOSIS — M898X9 Other specified disorders of bone, unspecified site: Secondary | ICD-10-CM | POA: Insufficient documentation

## 2021-01-07 DIAGNOSIS — G8220 Paraplegia, unspecified: Secondary | ICD-10-CM | POA: Insufficient documentation

## 2021-01-07 DIAGNOSIS — L89304 Pressure ulcer of unspecified buttock, stage 4: Secondary | ICD-10-CM | POA: Insufficient documentation

## 2021-01-10 ENCOUNTER — Telehealth: Payer: Self-pay | Admitting: *Deleted

## 2021-01-10 DIAGNOSIS — M25551 Pain in right hip: Secondary | ICD-10-CM

## 2021-01-10 MED ORDER — OXYCODONE HCL 10 MG PO TABS: 10.0000 mg | ORAL_TABLET | Freq: Four times a day (QID) | ORAL | 0 refills | Status: DC | PRN

## 2021-01-10 MED ORDER — TRAMADOL HCL 50 MG PO TABS: 100.0000 mg | ORAL_TABLET | Freq: Three times a day (TID) | ORAL | 5 refills | Status: DC

## 2021-01-10 NOTE — Telephone Encounter (Signed)
Sent in refills- thank you

## 2021-01-10 NOTE — Telephone Encounter (Signed)
[name] called for a refill on his oxycodone and tramadol (again).  He has an appt scheduled for 01/17/21. He missed the appt last week.  Please advise. (there is also a refill request from [name] too.

## 2021-01-11 MED ORDER — TRAMADOL HCL 50 MG PO TABS: 100.0000 mg | ORAL_TABLET | Freq: Four times a day (QID) | ORAL | 5 refills | Status: DC

## 2021-01-11 NOTE — Telephone Encounter (Signed)
[name] has been out since last week and pharmacy will not let him have his RX due to being too early. I spoke with him and he has been taking them 1.5 tablets  Instead of the 1 tablet q 6 hours like on the Rx.  He is sick and is in so much pain he is contemplating going to the ED.  He would like a call from Dr Berline Chough.

## 2021-01-11 NOTE — Telephone Encounter (Signed)
Having severe R hip pain- having associated AD Sx's with sweats, high BP, headache.   Sounds like he might have fractured or dislocated R hip- hanging loosely according to pt.   Also asking to increase tramadol to 100 mg 4x/day- that's how he's been taking it- so asked for increase, which I agree, esp if fractured  R hip- xray ordered at [facility]- pt says needs to arrange transportation to get him there- but will get it done.

## 2021-01-14 ENCOUNTER — Telehealth: Payer: Self-pay | Admitting: Physical Medicine and Rehabilitation

## 2021-01-14 NOTE — Telephone Encounter (Signed)
Pharmacy is saying he needs prior authorization for medication the dosage has been increased and now he cant get it

## 2021-01-14 NOTE — Telephone Encounter (Signed)
Please, as we discussed, let pt know he can pick up lower dose Rx sent in earlier this week and use that to get through til we get higher dose authorized.  Thank you, ML

## 2021-01-17 ENCOUNTER — Other Ambulatory Visit: Payer: Self-pay

## 2021-01-17 ENCOUNTER — Encounter: Payer: Self-pay | Admitting: Physical Medicine and Rehabilitation

## 2021-01-17 ENCOUNTER — Encounter (HOSPITAL_BASED_OUTPATIENT_CLINIC_OR_DEPARTMENT_OTHER): Payer: Medicaid Other | Admitting: Physical Medicine and Rehabilitation

## 2021-01-17 VITALS — BP 146/80

## 2021-01-17 DIAGNOSIS — L89304 Pressure ulcer of unspecified buttock, stage 4: Secondary | ICD-10-CM | POA: Diagnosis not present

## 2021-01-17 DIAGNOSIS — G8220 Paraplegia, unspecified: Secondary | ICD-10-CM

## 2021-01-17 DIAGNOSIS — R29818 Other symptoms and signs involving the nervous system: Secondary | ICD-10-CM

## 2021-01-17 DIAGNOSIS — G894 Chronic pain syndrome: Secondary | ICD-10-CM

## 2021-01-17 DIAGNOSIS — M898X9 Other specified disorders of bone, unspecified site: Secondary | ICD-10-CM | POA: Diagnosis not present

## 2021-01-17 DIAGNOSIS — Z993 Dependence on wheelchair: Secondary | ICD-10-CM

## 2021-01-17 MED ORDER — TRAMADOL HCL 50 MG PO TABS: 100.0000 mg | ORAL_TABLET | Freq: Four times a day (QID) | ORAL | 5 refills | Status: DC

## 2021-01-17 MED ORDER — TIZANIDINE HCL 4 MG PO TABS: 4.0000 mg | ORAL_TABLET | Freq: Two times a day (BID) | ORAL | 5 refills | Status: DC

## 2021-01-17 MED ORDER — BACLOFEN 20 MG PO TABS: 40.0000 mg | ORAL_TABLET | Freq: Four times a day (QID) | ORAL | 5 refills | Status: DC

## 2021-01-17 MED ORDER — TRAZODONE HCL 100 MG PO TABS: 100.0000 mg | ORAL_TABLET | Freq: Every day | ORAL | 5 refills | Status: DC
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
## 2021-01-17 NOTE — Patient Instructions (Signed)
1. Trying to wean off Oxycodone- trying hard.  Let me know each month how the weaning meds is going.   2. Sent in the new Tramadol Rx just now for 100 mg 4x/day.  Likes it better than Oxycodone.   3. Refills Baclofen 40 mg  4x/day QID   4. Restart Trazodone 100 mg nightly for sleep - 30 pills with 5 refills.  (6 months supply)  5. Increase tizanidine 4-8 mg 2x/day -so 1-2 tabs 2x/day for spasms/tightness.   6. Ortho referral - for extensive heterotopic ossification- of L hip/pelvis- needs surgical resection.  Has been 6 months since first diagnosed.   7.  Look for documents related to CAP program- it, in [place], will allow certain patients to get some help at home-   8. F/U in 8 weeks double appointment.

## 2021-01-18 ENCOUNTER — Telehealth: Payer: Self-pay | Admitting: *Deleted

## 2021-01-18 NOTE — Telephone Encounter (Signed)
Prior auth submitted to [facility] for Tramadol 50 mg, 100 mg 4 times daily.  Approval received confirmation #[id number] effective 01/16/2021-07/15/2021

## 2021-01-19 ENCOUNTER — Encounter (HOSPITAL_BASED_OUTPATIENT_CLINIC_OR_DEPARTMENT_OTHER): Payer: Medicaid Other | Admitting: Physician Assistant

## 2021-01-19 ENCOUNTER — Other Ambulatory Visit: Payer: Self-pay

## 2021-01-19 DIAGNOSIS — L89154 Pressure ulcer of sacral region, stage 4: Secondary | ICD-10-CM | POA: Diagnosis not present

## 2021-01-19 NOTE — Progress Notes (Signed)
[name], [name] ([id number]) Visit Report for 01/19/2021 Arrival Information Details Patient Name: Date of Service: [name], [name] 01/19/2021 2:00 PM Medical Record Number: [id number] Patient Account Number: [id number] Date of Birth/Sex: Treating RN: 03-01-1991 (29 y.o. [name], [name] Primary Care Provider: Raliegh [name] Other Clinician: Referring Provider: Treating Provider/Extender: [name] in Treatment: 24 Visit Information History Since Last Visit Added or deleted any medications: No Patient Arrived: Wheel Chair Any new allergies or adverse reactions: No Arrival Time: 14:10 Had [name] fall or experienced change in No Accompanied By: self activities of daily living that may affect Transfer Assistance: None risk of falls: Patient Identification Verified: Yes Signs or symptoms of abuse/neglect since last visito No Secondary Verification Process Completed: Yes Hospitalized since last visit: No Patient Requires Transmission-Based Precautions: No Implantable device outside of the clinic excluding No Patient Has Alerts: No cellular tissue based products placed in the center since last visit: Has Dressing in Place as Prescribed: Yes Pain Present Now: No Electronic Signature(s) Signed: 01/19/2021 5:17:36 PM By: [name] Entered By: [name] on 01/19/2021 14:15:11 -------------------------------------------------------------------------------- Clinic Level of Care Assessment Details Patient Name: Date of Service: [name], [name] L. 01/19/2021 2:00 PM Medical Record Number: [id number] Patient Account Number: [id number] Date of Birth/Sex: Treating RN: 08/06/1991 (29 y.o. [name] Primary Care Provider: Raliegh [name] Other Clinician: Referring Provider: Treating Provider/Extender: [name] in Treatment: 24 Clinic Level of Care Assessment Items TOOL 4 Quantity Score []  - 0 Use when only an  EandM is performed on FOLLOW-UP visit ASSESSMENTS - Nursing Assessment / Reassessment X- 1 10 Reassessment of Co-morbidities (includes updates in patient status) X- 1 5 Reassessment of Adherence to Treatment Plan ASSESSMENTS - Wound and Skin [name] ssessment / Reassessment []  - 0 Simple Wound Assessment / Reassessment - one wound X- 2 5 Complex Wound Assessment / Reassessment - multiple wounds []  - 0 Dermatologic / Skin Assessment (not related to wound area) ASSESSMENTS - Focused Assessment []  - 0 Circumferential Edema Measurements - multi extremities []  - 0 Nutritional Assessment / Counseling / Intervention X- 1 5 Lower Extremity Assessment (monofilament, tuning fork, pulses) []  - 0 Peripheral Arterial Disease Assessment (using hand held doppler) ASSESSMENTS - Ostomy and/or Continence Assessment and Care []  - 0 Incontinence Assessment and Management []  - 0 Ostomy Care Assessment and Management (repouching, etc.) PROCESS - Coordination of Care X - Simple Patient / Family Education for ongoing care 1 15 []  - 0 Complex (extensive) Patient / Family Education for ongoing care X- 1 10 Staff obtains Consents, Records, T Results / Process Orders est []  - 0 Staff telephones HHA, Nursing Homes / Clarify orders / etc []  - 0 Routine Transfer to another Facility (non-emergent condition) []  - 0 Routine Hospital Admission (non-emergent condition) []  - 0 New Admissions / / Ordering NPWT Apligraf, etc. , []  - 0 Emergency Hospital Admission (emergent condition) X- 1 10 Simple Discharge Coordination []  - 0 Complex (extensive) Discharge Coordination PROCESS - Special Needs []  - 0 Pediatric / Minor Patient Management []  - 0 Isolation Patient Management []  - 0 Hearing / Language / Visual special needs []  - 0 Assessment of Community assistance (transportation, D/C planning, etc.) []  - 0 Additional assistance / Altered mentation []  - 0 Support Surface(s)  Assessment (bed, cushion, seat, etc.) INTERVENTIONS - Wound Cleansing / Measurement []  - 0 Simple Wound Cleansing - one wound X- 2 5 Complex Wound Cleansing - multiple  wounds X- 1 5 Wound Imaging (photographs - any number of wounds) []  - 0 Wound Tracing (instead of photographs) []  - 0 Simple Wound Measurement - one wound X- 2 5 Complex Wound Measurement - multiple wounds INTERVENTIONS - Wound Dressings X - Small Wound Dressing one or multiple wounds 2 10 []  - 0 Medium Wound Dressing one or multiple wounds []  - 0 Large Wound Dressing one or multiple wounds X- 1 5 Application of Medications - topical []  - 0 Application of Medications - injection INTERVENTIONS - Miscellaneous []  - 0 External ear exam []  - 0 Specimen Collection (cultures, biopsies, blood, body fluids, etc.) []  - 0 Specimen(s) / Culture(s) sent or taken to Lab for analysis []  - 0 Patient Transfer (multiple staff / Nurse, adultHoyer Lift / Similar devices) []  - 0 Simple Staple / Suture removal (25 or less) []  - 0 Complex Staple / Suture removal (26 or more) []  - 0 Hypo / Hyperglycemic Management (close monitor of Blood Glucose) []  - 0 Ankle / Brachial Index (ABI) - do not check if billed separately X- 1 5 Vital Signs Has the patient been seen at the hospital within the last three years: Yes Total Score: 120 Level Of Care: New/Established - Level 4 Electronic Signature(s) Signed: 01/19/2021 5:21:35 PM By: [name], [name] Entered By: [name] on 01/19/2021 14:34:50 -------------------------------------------------------------------------------- Encounter Discharge Information Details Patient Name: Date of Service: [name], [name] 01/19/2021 2:00 PM Medical Record Number: [id number] Patient Account Number: [id number] Date of Birth/Sex: Treating RN: 05/23/1991 (29 y.o. [name]) [name], [name] Primary Care Provider: Raliegh IpStroud, [name] Other Clinician: Referring Provider: Treating Provider/Extender: [name] GratesStone  III, Hoyt [name], [name] Weeks in Treatment: 24 Encounter Discharge Information Items Discharge Condition: Stable Ambulatory Status: Wheelchair Discharge Destination: Home Transportation: Private Auto Accompanied By: self Schedule Follow-up Appointment: Yes Clinical Summary of Care: Patient Declined Electronic Signature(s) Signed: 01/19/2021 5:04:01 PM By: Fonnie MuBreedlove, Lauren RN Entered By: Fonnie MuBreedlove, [name] on 01/19/2021 14:45:22 -------------------------------------------------------------------------------- Lower Extremity Assessment Details Patient Name: Date of Service: [name], [name] 01/19/2021 2:00 PM Medical Record Number: [id number] Patient Account Number: [id number] Date of Birth/Sex: Treating RN: 12/08/1990 (29 y.o. Tammy SoursM) Deaton, Bobbi Primary Care Provider: Raliegh IpStroud, [name] Other Clinician: Referring Provider: Treating Provider/Extender: Angus PalmsStone III, Hoyt [name], [name] Weeks in Treatment: 24 Edema Assessment Assessed: [name][Left: Yes] Franne Forts[Right: No] Edema: [Left: Yes] [Right: No] Calf Left: Right: Point of Measurement: 47 cm From Medial Instep 27.5 cm Ankle Left: Right: Point of Measurement: 12 cm From Medial Instep 21 cm Vascular Assessment Pulses: Dorsalis Pedis Palpable: [Left:Yes] Electronic Signature(s) Signed: 01/19/2021 5:17:36 PM By: [name] Stalleaton, Bobbi Entered By: [name] Stalleaton, Bobbi on 01/19/2021 14:16:51 -------------------------------------------------------------------------------- Multi-Disciplinary Care Plan Details Patient Name: Date of Service: [name], [name] 01/19/2021 2:00 PM Medical Record Number: [id number] Patient Account Number: [id number] Date of Birth/Sex: Treating RN: 12/02/1990 (29 y.o. [name] SchoonerM) Boehlein, [name] Primary Care Provider: Raliegh IpStroud, [name] Other Clinician: Referring Provider: Treating Provider/Extender: [name] GratesStone III, Hoyt [name], [name] Weeks in Treatment: 24 Multidisciplinary Care Plan reviewed with physician Active  Inactive Pressure Nursing Diagnoses: Knowledge deficit related to causes and risk factors for pressure ulcer development Goals: Patient/caregiver will verbalize risk factors for pressure ulcer development Date Initiated: 07/30/2020 Target Resolution Date: 02/16/2021 Goal Status: Active Interventions: Provide education on pressure ulcers Notes: Wound/Skin Impairment Nursing Diagnoses: Impaired tissue integrity Goals: Patient/caregiver will verbalize understanding of skin care regimen Date Initiated: 01/05/2021 Target Resolution Date: 02/02/2021 Goal Status: Active Ulcer/skin breakdown will have [name] volume reduction of 50% by week 8 Date  Initiated: 07/30/2020 Date Inactivated: 10/20/2020 Target Resolution Date: 09/27/2020 Goal Status: Unmet Unmet Reason: Infection Ulcer/skin breakdown will have [name] volume reduction of 80% by week 12 Date Initiated: 10/20/2020 Date Inactivated: 12/22/2020 Target Resolution Date: 11/19/2020 Unmet Reason: paraplegic, difficulty Goal Status: Unmet offloading Interventions: Provide education on ulcer and skin care Notes: Electronic Signature(s) Signed: 01/19/2021 5:21:35 PM By: [name] Deed RN, [name] Entered By: [name] Deed on 01/19/2021 14:28:52 -------------------------------------------------------------------------------- Pain Assessment Details Patient Name: Date of Service: [name] March L. 01/19/2021 2:00 PM Medical Record Number: [id number] Patient Account Number: [id number] Date of Birth/Sex: Treating RN: 03-Dec-1990 (29 y.o. [name] Primary Care Provider: Raliegh [name] Other Clinician: Referring Provider: Treating Provider/Extender: [name] in Treatment: 24 Active Problems Location of Pain Severity and Description of Pain Patient Has Paino No Site Locations Rate the pain. Current Pain Level: 0 Pain Management and Medication Current Pain Management: Medication: No Cold Application:  No Rest: No Massage: No Activity: No T.E.N.S.: No Heat Application: No Leg drop or elevation: No Is the Current Pain Management Adequate: Adequate How does your wound impact your activities of daily livingo Sleep: No Bathing: No Appetite: No Relationship With Others: No Bladder Continence: No Emotions: No Bowel Continence: No Work: No Toileting: No Drive: No Dressing: No Hobbies: No Electronic Signature(s) Signed: 01/19/2021 5:17:36 PM By: [name] Entered By: [name] on 01/19/2021 14:16:35 -------------------------------------------------------------------------------- Patient/Caregiver Education Details Patient Name: Date of Service: [name] 2/23/2022andnbsp2:00 PM Medical Record Number: [id number] Patient Account Number: [id number] Date of Birth/Gender: Treating RN: 1990/12/27 (29 y.o. [name] Primary Care Physician: [name] Other Clinician: Referring Physician: Treating Physician/Extender: [name] in Treatment: 24 Education Assessment Education Provided To: Patient Education Topics Provided Pressure: Methods: Explain/Verbal Responses: Reinforcements needed, State content correctly Wound/Skin Impairment: Methods: Explain/Verbal Responses: Reinforcements needed, State content correctly Electronic Signature(s) Signed: 01/19/2021 5:21:35 PM By: [name] Deed RN, [name] Entered By: [name] Deed on 01/19/2021 14:29:14 -------------------------------------------------------------------------------- Wound Assessment Details Patient Name: Date of Service: [name], [name] 01/19/2021 2:00 PM Medical Record Number: [id number] Patient Account Number: [id number] Date of Birth/Sex: Treating RN: Apr 13, 1991 (29 y.o. [name] Primary Care Provider: Raliegh [name] Other Clinician: Referring Provider: Treating Provider/Extender: Angus Palms Weeks in Treatment:  24 Wound Status Wound Number: 3 Primary Etiology: Pressure Ulcer Wound Location: Left, Lateral Malleolus Wound Status: Open Wounding Event: Gradually Appeared Comorbid History: Paraplegia Date Acquired: 06/27/2020 Weeks Of Treatment: 24 Clustered Wound: No Wound Measurements Length: (cm) 0.6 Width: (cm) 1.6 Depth: (cm) 0.4 Area: (cm) 0.754 Volume: (cm) 0.302 % Reduction in Area: 79.1% % Reduction in Volume: 86.1% Epithelialization: Small (1-33%) Tunneling: No Undermining: No Wound Description Classification: Category/Stage IV Wound Margin: Epibole Exudate Amount: Medium Exudate Type: Serosanguineous Exudate Color: red, brown Foul Odor After Cleansing: No Slough/Fibrino Yes Wound Bed Granulation Amount: Large (67-100%) Exposed Structure Granulation Quality: Red Fascia Exposed: No Necrotic Amount: Small (1-33%) Fat Layer (Subcutaneous Tissue) Exposed: Yes Necrotic Quality: Adherent Slough Tendon Exposed: No Muscle Exposed: No Joint Exposed: No Bone Exposed: No Treatment Notes Wound #3 (Malleolus) Wound Laterality: Left, Lateral Cleanser Normal Saline Discharge Instruction: Cleanse the wound with Normal Saline prior to applying [name] clean dressing using gauze sponges, not tissue or cotton balls. Normal Saline Discharge Instruction: Cleanse the wound with Normal Saline prior to applying [name] clean dressing using gauze sponges, not tissue or cotton balls. Peri-Wound Care Topical Primary Dressing Promogran Prisma Matrix, 4.34 (sq  in) (silver collagen) Discharge Instruction: Moisten collagen with saline or hydrogel Secondary Dressing ComfortFoam Border, 4x4 in (silicone border) Discharge Instruction: Apply over primary dressing as directed. Secured With Compression Wrap Compression Stockings Facilities manager) Signed: 01/19/2021 5:17:36 PM By: [name] Entered By: [name] on 01/19/2021  14:17:51 -------------------------------------------------------------------------------- Wound Assessment Details Patient Name: Date of Service: [name], [name] NIO L. 01/19/2021 2:00 PM Medical Record Number: [id number] Patient Account Number: [id number] Date of Birth/Sex: Treating RN: May 27, 1991 (29 y.o. [name] Primary Care Provider: Raliegh [name] Other Clinician: Referring Provider: Treating Provider/Extender: Angus Palms Weeks in Treatment: 24 Wound Status Wound Number: 4 Primary Etiology: Pressure Ulcer Wound Location: Sacrum Wound Status: Open Wounding Event: Gradually Appeared Comorbid History: Paraplegia Date Acquired: 05/27/2020 Weeks Of Treatment: 24 Clustered Wound: No Wound Measurements Length: (cm) 3.5 Width: (cm) 0.7 Depth: (cm) 0.6 Area: (cm) 1.924 Volume: (cm) 1.155 % Reduction in Area: 86.4% % Reduction in Volume: 95.7% Epithelialization: Large (67-100%) Tunneling: No Undermining: No Wound Description Classification: Category/Stage IV Wound Margin: Well defined, not attached Exudate Amount: Large Exudate Type: Serosanguineous Exudate Color: red, brown Foul Odor After Cleansing: No Slough/Fibrino Yes Wound Bed Granulation Amount: Large (67-100%) Exposed Structure Granulation Quality: Pink, Pale Fascia Exposed: No Necrotic Amount: Small (1-33%) Fat Layer (Subcutaneous Tissue) Exposed: Yes Necrotic Quality: Adherent Slough Tendon Exposed: No Muscle Exposed: No Joint Exposed: No Bone Exposed: Yes Treatment Notes Wound #4 (Sacrum) Cleanser Peri-Wound Care Topical Primary Dressing Promogran Prisma Matrix, 4.34 (sq in) (silver collagen) Discharge Instruction: to line wound bed, do not moisten, back with dry gauze Secondary Dressing ABD Pad, 5x9 Discharge Instruction: Apply over primary dressing or bordered foam Secured With 11M Medipore H Soft Cloth Surgical T 4 x 2 (in/yd) ape Discharge Instruction: Secure  dressing with tape as directed. Compression Wrap Compression Stockings Add-Ons Electronic Signature(s) Signed: 01/19/2021 5:17:36 PM By: [name] Entered By: [name] on 01/19/2021 14:19:08 -------------------------------------------------------------------------------- Vitals Details Patient Name: Date of Service: [name] March L. 01/19/2021 2:00 PM Medical Record Number: [id number] Patient Account Number: [id number] Date of Birth/Sex: Treating RN: 21-Aug-1991 (29 y.o. [name] Primary Care Provider: Raliegh [name] Other Clinician: Referring Provider: Treating Provider/Extender: [name] in Treatment: 24 Vital Signs Time Taken: 14:15 Temperature (F): 97.6 Height (in): 71 Pulse (bpm): 108 Weight (lbs): 136 Respiratory Rate (breaths/min): 16 Body Mass Index (BMI): 19 Blood Pressure (mmHg): 135/85 Reference Range: 80 - 120 mg / dl Electronic Signature(s) Signed: 01/19/2021 5:17:36 PM By: [name] Entered By: [name] on 01/19/2021 14:16:20

## 2021-01-19 NOTE — Progress Notes (Addendum)
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 Plan Follow-up Appointments: Return appointment in 3 weeks. Bathing/ Shower/ Hygiene: May shower with protection but do not get wound dressing(s) wet. Off-Loading: Turn and reposition every 2 hours - be sure to lift up with arms every hour while in wheelchair Other: - pillows under left calf to keep pressure of left lateral ankle WOUND #3: - Malleolus Wound Laterality: Left, Lateral Cleanser: Normal Saline Every Other Day/30 Days Discharge Instructions: Cleanse the wound with Normal Saline prior to applying [name] clean dressing using gauze sponges, not tissue or cotton balls. Cleanser: Normal Saline Every Other Day/30 Days Discharge Instructions: Cleanse the wound with Normal Saline prior to applying [name] clean dressing using gauze sponges, not tissue or cotton balls. Prim Dressing: Promogran Prisma Matrix, 4.34 (sq in) (silver collagen) Every Other Day/30 Days ary Discharge Instructions: Moisten collagen with saline or hydrogel Secondary Dressing: ComfortFoam Border, 4x4 in (silicone border) Every Other Day/30 Days Discharge Instructions: Apply over primary dressing as directed. WOUND #4: - Sacrum Wound Laterality: Prim Dressing: Promogran Prisma Matrix, 4.34 (sq in) (silver collagen) (Generic) Every Other Day/30 Days ary Discharge Instructions: to line wound bed, do not moisten, back with dry gauze Secondary Dressing: ABD Pad, 5x9 Every Other Day/30 Days Discharge  Instructions: Apply over primary dressing or bordered foam Secured With: 59M Medipore H Soft Cloth Surgical T 4 x 2 (in/yd) (Generic) Every Other Day/30 Days ape Discharge Instructions: Secure dressing with tape as directed. 1. Would recommend currently that we go ahead and continue with actually use collagen at both wound locations. I think that will be [name] good way to go. 2. Also can recommend that we  have the patient use something such as an extra padded dressing over the ankle especially but also over the sacral region as both are draining quite [name] bit I will also provide some protection. We will see patient back for reevaluation in 3 weeks here in the clinic. If anything worsens or changes patient will contact our office for additional recommendations. Electronic Signature(s) Signed: 01/19/2021 2:43:01 PM By: Lenda Kelp [name] Entered By: Lenda Kelp on 01/19/2021 14:43:01 -------------------------------------------------------------------------------- SuperBill Details Patient Name: Date of Service: [name], [name] 01/19/2021 Medical Record Number: [id number] Patient Account Number: [id number] Date of Birth/Sex: Treating RN: February 23, 1991 (29 y.o. [name] Schooner Primary Care Provider: [name] Other Clinician: Referring Provider: Treating Provider/Extender: Angus Palms Weeks in Treatment: 24 Diagnosis Coding ICD-10 Codes Code Description [id number] Pressure ulcer of sacral region, stage 4 L89.523 Pressure ulcer of left ankle, stage 3 E43 Unspecified severe protein-calorie malnutrition G82.21 Paraplegia, complete M86.68 Other chronic osteomyelitis, other site Facility Procedures CPT4 Code: [id number] Description: 99214 - WOUND CARE VISIT-LEV 4 EST PT Modifier: Quantity: 1 Physician Procedures : CPT4 Code Description Modifier [id number] - WC PHYS LEVEL 3 - EST PT ICD-10 Diagnosis Description L89.154 Pressure ulcer of sacral region, stage 4  L89.523 Pressure ulcer of left ankle, stage 3 E43 Unspecified severe protein-calorie malnutrition  G82.21 Paraplegia, complete Quantity: 1 Electronic Signature(s) Signed: 01/19/2021 2:43:13 PM By: Lenda Kelp [name] Entered By: Lenda Kelp on 01/19/2021 14:43:12

## 2021-01-31 ENCOUNTER — Ambulatory Visit: Payer: Medicaid Other | Admitting: Orthopaedic Surgery

## 2021-02-09 ENCOUNTER — Encounter (HOSPITAL_BASED_OUTPATIENT_CLINIC_OR_DEPARTMENT_OTHER): Payer: No Typology Code available for payment source | Admitting: Physician Assistant

## 2021-02-16 ENCOUNTER — Ambulatory Visit (INDEPENDENT_AMBULATORY_CARE_PROVIDER_SITE_OTHER): Payer: Medicaid Other | Admitting: Orthopaedic Surgery

## 2021-02-16 ENCOUNTER — Encounter (HOSPITAL_BASED_OUTPATIENT_CLINIC_OR_DEPARTMENT_OTHER): Payer: Medicaid Other | Attending: Physician Assistant | Admitting: Physician Assistant

## 2021-02-16 ENCOUNTER — Other Ambulatory Visit: Payer: Self-pay

## 2021-02-16 ENCOUNTER — Ambulatory Visit (INDEPENDENT_AMBULATORY_CARE_PROVIDER_SITE_OTHER): Payer: Medicaid Other

## 2021-02-16 ENCOUNTER — Telehealth: Payer: Self-pay

## 2021-02-16 VITALS — Ht 71.0 in | Wt 127.9 lb

## 2021-02-16 DIAGNOSIS — M898X9 Other specified disorders of bone, unspecified site: Secondary | ICD-10-CM

## 2021-02-16 DIAGNOSIS — Z888 Allergy status to other drugs, medicaments and biological substances status: Secondary | ICD-10-CM | POA: Diagnosis not present

## 2021-02-16 DIAGNOSIS — Z681 Body mass index (BMI) 19 or less, adult: Secondary | ICD-10-CM | POA: Insufficient documentation

## 2021-02-16 DIAGNOSIS — L89159 Pressure ulcer of sacral region, unspecified stage: Secondary | ICD-10-CM | POA: Diagnosis present

## 2021-02-16 DIAGNOSIS — L89154 Pressure ulcer of sacral region, stage 4: Secondary | ICD-10-CM | POA: Diagnosis not present

## 2021-02-16 DIAGNOSIS — E43 Unspecified severe protein-calorie malnutrition: Secondary | ICD-10-CM | POA: Insufficient documentation

## 2021-02-16 DIAGNOSIS — G8221 Paraplegia, complete: Secondary | ICD-10-CM | POA: Insufficient documentation

## 2021-02-16 DIAGNOSIS — L89310 Pressure ulcer of right buttock, unstageable: Secondary | ICD-10-CM | POA: Insufficient documentation

## 2021-02-16 DIAGNOSIS — M8668 Other chronic osteomyelitis, other site: Secondary | ICD-10-CM | POA: Insufficient documentation

## 2021-02-16 DIAGNOSIS — M25552 Pain in left hip: Secondary | ICD-10-CM

## 2021-02-16 DIAGNOSIS — R29818 Other symptoms and signs involving the nervous system: Secondary | ICD-10-CM

## 2021-02-16 DIAGNOSIS — M25551 Pain in right hip: Secondary | ICD-10-CM

## 2021-02-16 DIAGNOSIS — L89523 Pressure ulcer of left ankle, stage 3: Secondary | ICD-10-CM | POA: Insufficient documentation
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
## 2021-02-16 NOTE — Progress Notes (Addendum)
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 11/03/2020 upon evaluation today patient actually appears to be doing excellent in regard to his wounds. He does tell me that he is think about going back to see Dr. Arita Miss to talk about doing the flap surgery for the wound on the sacral region. In regard to the left lateral malleolus this is pretty much about closed as far as I am concerned. Obviously he seems to be doing excellent and I am very pleased with where things stand today. Patient is extremely happy to hear this. No fevers, chills, nausea, vomiting, or diarrhea. 12/22/2020 upon evaluation today patient appears to be doing better in regard to his wounds. With that being said I do not see any signs of infection which is great news. Overall I think that he is making good progress here. I do not even know the skin needed flap in regard to the left sacral region. Nonetheless I do think that he is having some issues he tells me what he feels like may be [name] dislocation of his right hip I think he needs to see orthopedics as soon as possible in that regard. T give him information for that today. o 01/05/2021 upon evaluation today patient appears to be doing well with regard to  his wound. He has been tolerating the dressing changes without complication both in regard to the sacral region and the ankle although he has not gotten the Santyl we really need to see about getting that as soon as possible. He did receive [name] call from the pharmacy she just did not get the prescription at that point. 01/19/2021 upon evaluation today patient appears to be doing well with regard to his wounds currently. Both appear to be fairly clean. Fortunately there is no signs of active infection at this time. No fevers, chills, nausea, vomiting, or diarrhea. 02/16/2021 upon evaluation today patient appears to be doing decently well in regard to the sacral wound as well as his ankle wound. Both  are showing signs of significant improvement which is great news and I am pleased in that regard. There does not appear to be any evidence of infection which is also excellent news. Unfortunately he has [name] right ischial ulcer which is new and unfortunately I think this is also unstageable which means we are unsure how deep this is really the end up being. Obviously I think this is [name] big deal. Objective Constitutional Well-nourished and well-hydrated in no acute distress. Vitals Time Taken: 9:18 AM, Height: 71 in, Weight: 136 lbs, BMI: 19, Temperature: 97.9 F, Pulse: 105 bpm, Respiratory Rate: 16 breaths/min, Blood Pressure: 128/90 mmHg. Respiratory normal breathing without difficulty. Psychiatric this patient is able to make decisions and demonstrates good insight into disease process. Alert and Oriented x 3. pleasant and cooperative. General Notes: Upon inspection patient's wounds again in the sacral area is almost healed ankle also appears to be doing excellent unfortunately the new wound in the right ischial location is not doing nearly as well at all. This is obviously [name] pressure injury unstageable and is going to need intervention as soon as possible. I think Santyl would be appropriate  here. Integumentary (Hair, Skin) Wound #3 status is Open. Original cause of wound was Gradually Appeared. The date acquired was: 06/27/2020. The wound has been in treatment 28 weeks. The wound is located on the Left,Lateral Malleolus. The wound measures 0.9cm length x 0.7cm width x 0.5cm depth; 0.495cm^2 area and 0.247cm^3 volume. There is Fat Layer (Subcutaneous Tissue) exposed. There is no tunneling or undermining noted. There is [name] medium amount of serosanguineous drainage noted. The wound margin is epibole. There is large (67-100%) red granulation within the wound bed. There is [name] small (1-33%) amount of necrotic tissue within the wound bed including Adherent Slough. Wound #4 status is Open. Original cause of wound was Gradually Appeared. The date acquired was: 05/27/2020. The wound has been in treatment 28 weeks. The wound is located on the Sacrum. The wound measures 0.4cm length x 0.3cm width x 0.1cm depth; 0.094cm^2 area and 0.009cm^3 volume. There is no tunneling or undermining noted. There is [name] medium amount of serosanguineous drainage noted. The wound margin is well defined and not attached to the wound base. There is large (67-100%) pink, pale granulation within the wound bed. There is no necrotic tissue within the wound bed. Wound #6 status is Open. Original cause of wound was Trauma. The date acquired was: 02/10/2021. The wound is located on the Right Ischium. The wound measures 5cm length x 3.9cm width x 0.1cm depth; 15.315cm^2 area and 1.532cm^3 volume. There is Fat Layer (Subcutaneous Tissue) exposed. There is no tunneling or undermining noted. There is [name] medium amount of serosanguineous drainage noted. The wound margin is distinct with the outline attached to the wound base. There is no granulation within the wound bed. There is [name] large (67-100%) amount of necrotic tissue within the wound bed including Eschar and Adherent Slough. Assessment Active Problems ICD-10 Pressure ulcer of sacral  region, stage 4 Pressure ulcer of left ankle, stage 3 Pressure ulcer of right buttock, unstageable Unspecified severe protein-calorie malnutrition Paraplegia, complete Other chronic osteomyelitis, other site Procedures Wound #6 Pre-procedure diagnosis of Wound #6 is [name] Pressure Ulcer located on the Right Ischium . There was [name] Chemical/Enzymatic/Mechanical debridement performed by Fonnie Mu, RN.Marland Kitchen Agent used was [employer]. There was no bleeding. The procedure was tolerated well. Post Debridement Measurements: 5cm length x 3.9cm width x 0.1cm depth; 1.532cm^3 volume. Post debridement Stage noted as Unstageable/Unclassified.  Character of Wound/Ulcer Post Debridement requires further debridement. Post procedure Diagnosis Wound #6: Same as Pre-Procedure Plan Follow-up Appointments: Return Appointment in 2 weeks. Bathing/ Shower/ Hygiene: May shower with protection but do not get wound dressing(s) wet. Off-Loading: Turn and reposition every 2 hours - be sure to lift up with arms every hour while in wheelchair Other: - pillows under left calf to keep pressure of left lateral ankle Home Health: Admit to Home Health for wound care. May utilize formulary equivalent dressing for wound treatment orders unless otherwise specified. Dressing changes to be completed by Home Health on Monday / Wednesday / Friday except when patient has scheduled visit at [facility]. The following medication(s) was prescribed: Santyl topical 250 unit/gram ointment ointment topical Apply nickel thick daily to the wound bed and then cover with [name] dressing as directed in clinic x 30 days starting 02/16/2021 WOUND #3: - Malleolus Wound Laterality: Left, Lateral Cleanser: Normal Saline Every Other Day/30 Days Discharge Instructions: Cleanse the wound with Normal Saline prior to applying [name] clean dressing using gauze sponges, not tissue or cotton balls. Cleanser: Normal Saline Every Other Day/30 Days Discharge  Instructions: Cleanse the wound with Normal Saline prior to applying [name] clean dressing using gauze sponges, not tissue or cotton balls. Prim Dressing: Promogran Prisma Matrix, 4.34 (sq in) (silver collagen) Every Other Day/30 Days ary Discharge Instructions: Moisten collagen with saline or hydrogel Secondary Dressing: ComfortFoam Border, 4x4 in (silicone border) Every Other Day/30 Days Discharge Instructions: Apply over primary dressing as directed. WOUND #4: - Sacrum Wound Laterality: Prim Dressing: Promogran Prisma Matrix, 4.34 (sq in) (silver collagen) (Generic) Every Other Day/30 Days ary Discharge Instructions: to line wound bed, do not moisten, back with dry gauze Secondary Dressing: ABD Pad, 5x9 Every Other Day/30 Days Discharge Instructions: Apply over primary dressing or bordered foam Secured With: 36M Medipore H Soft Cloth Surgical T 4 x 2 (in/yd) (Generic) Every Other Day/30 Days ape Discharge Instructions: Secure dressing with tape as directed. WOUND #6: - Ischium Wound Laterality: Right Prim Dressing: Santyl Ointment 1 x Per Day/30 Days ary Discharge Instructions: Apply nickel thick amount to wound bed as instructed Secondary Dressing: Woven Gauze Sponge, Non-Sterile 4x4 in 1 x Per Day/30 Days Discharge Instructions: moisten with saline Apply over santyl Secondary Dressing: ABD Pad, 5x9 1 x Per Day/30 Days Discharge Instructions: Apply over primary dressing as directed. Secured With: 36M Medipore H Soft Cloth Surgical T 4 x 2 (in/yd) 1 x Per Day/30 Days ape Discharge Instructions: Secure dressing with tape as directed. 1. Would recommend currently that we going to initiate treatment with Santyl the patient is in agreement with that plan. This is for the right ischial location. 2. We will use collagen on the other 2 wound areas. If were able to get these healed soon obviously he will have some collagen for some time but again if he is not able to get him healed more quickly he  will run out and he can purchase the collagen off [employer] that he tells me he does not really have the money to do so. Therefore he may just go back to using the antibiotic ointment which at least does seem to be beneficial for him. I think the big thing is keeping pressure off which he is done [name] very good job in regard to the 2 current wounds. 3. We are going to try 1 more time to get him home health. We will see patient back for reevaluation in 1 week here  in the clinic. If anything worsens or changes patient will contact our office for additional recommendations. Electronic Signature(s) Signed: 02/16/2021 10:43:21 AM By: Lenda Kelp PA-C Entered By: Lenda Kelp on 02/16/2021 10:43:21 -------------------------------------------------------------------------------- SuperBill Details Patient Name: Date of Service: [name], [name] 02/16/2021 Medical Record Number: [id number] Patient Account Number: [id number] Date of Birth/Sex: Treating RN: 20-Jun-1991 (29 y.o. [name] Primary Care Provider: [name] Other Clinician: Referring Provider: Treating Provider/Extender: Angus Palms Weeks in Treatment: 28 Diagnosis Coding ICD-10 Codes Code Description L89.154 Pressure ulcer of sacral region, stage 4 L89.523 Pressure ulcer of left ankle, stage 3 E43 Unspecified severe protein-calorie malnutrition G82.21 Paraplegia, complete M86.68 Other chronic osteomyelitis, other site Facility Procedures CPT4 Code: [id number] Description: 47829 - DEBRIDE W/O ANES NON SELECT Modifier: Quantity: 1 Physician Procedures : CPT4 Code Description Modifier [id number] - WC PHYS LEVEL 4 - EST PT ICD-10 Diagnosis Description L89.154 Pressure ulcer of sacral region, stage 4 L89.523 Pressure ulcer of left ankle, stage 3 G82.21 Paraplegia, complete E43 Unspecified severe  protein-calorie malnutrition Quantity: 1 Electronic Signature(s) Signed: 02/16/2021 10:43:35 AM  By: Lenda Kelp PA-C Entered By: Lenda Kelp on 02/16/2021 10:43:35

## 2021-02-17 NOTE — Progress Notes (Signed)
[name], [name] ([id number]) Visit Report for 02/16/2021 Arrival Information Details Patient Name: Date of Service: [name], [name] 02/16/2021 9:00 [name] Medical Record Number: [id number] Patient Account Number: [id number] Date of Birth/Sex: Treating RN: 03/16/1991 (29 y.o. [name] Primary Care [name]: [name] Other Clinician: Referring [name]: Treating [name]/Extender: [name] in Treatment: 28 Visit Information History Since Last Visit Added or deleted any medications: No Patient Arrived: Wheel Chair Any new allergies or adverse reactions: No Arrival Time: 09:16 Had [name] fall or experienced change in No Accompanied By: self activities of daily living that may affect Transfer Assistance: Transfer Board risk of falls: Patient Identification Verified: Yes Signs or symptoms of abuse/neglect since last visito No Secondary Verification Process Completed: Yes Hospitalized since last visit: No Patient Requires Transmission-Based Precautions: No Implantable device outside of the clinic excluding No Patient Has Alerts: No cellular tissue based products placed in the center since last visit: Has Dressing in Place as Prescribed: Yes Pain Present Now: No Electronic Signature(s) Signed: 02/17/2021 9:47:55 AM By: [name] Entered By: [name] on 02/16/2021 09:18:16 -------------------------------------------------------------------------------- Encounter Discharge Information Details Patient Name: Date of Service: [name] March L. 02/16/2021 9:00 [name] Medical Record Number: [id number] Patient Account Number: [id number] Date of Birth/Sex: Treating RN: 1991/08/30 (29 y.o. [name], [name] Primary Care [name]: [name] Other Clinician: Referring [name]: Treating [name]/Extender: [name] in Treatment: 28 Encounter Discharge Information Items Post Procedure  Vitals Discharge Condition: Stable Temperature (F): 97.9 Ambulatory Status: Wheelchair Pulse (bpm): 105 Discharge Destination: Home Respiratory Rate (breaths/min): 17 Transportation: Other Blood Pressure (mmHg): 128/90 Accompanied By: self Schedule Follow-up Appointment: Yes Clinical Summary of Care: Patient Declined Electronic Signature(s) Signed: 02/16/2021 5:50:40 PM By: [name] Mu RN Entered By: [name] on 02/16/2021 13:11:53 -------------------------------------------------------------------------------- Lower Extremity Assessment Details Patient Name: Date of Service: [name] March L. 02/16/2021 9:00 [name] Medical Record Number: [id number] Patient Account Number: [id number] Date of Birth/Sex: Treating RN: 1991-06-26 (29 y.o. [name], [name] Primary Care Lowry Bala: [name] Other Clinician: Referring [name]: Treating [name]/Extender: [name] Weeks in Treatment: 28 Edema Assessment Assessed: [name]: Yes] [name]: No] Edema: [Left: Yes] [Right: No] Calf Left: Right: Point of Measurement: 47 cm From Medial Instep 27.5 cm Ankle Left: Right: Point of Measurement: 12 cm From Medial Instep 21 cm Vascular Assessment Pulses: Dorsalis Pedis Palpable: [Left:Yes] Posterior Tibial Palpable: [Left:Yes] Electronic Signature(s) Signed: 02/16/2021 5:50:40 PM By: [name] Mu RN Entered By: [name] on 02/16/2021 09:26:28 -------------------------------------------------------------------------------- Multi-Disciplinary Care Plan Details Patient Name: Date of Service: [name] March L. 02/16/2021 9:00 [name] Medical Record Number: [id number] Patient Account Number: [id number] Date of Birth/Sex: Treating RN: Sep 17, 1991 (29 y.o. [name] Primary Care Elania [name]: [name] Other Clinician: Referring Heidy Mccubbin: Treating Andrianna Manalang/Extender: [name] in Treatment:  28 Multidisciplinary Care Plan reviewed with physician Active Inactive Pressure Nursing Diagnoses: Knowledge deficit related to causes and risk factors for pressure ulcer development Goals: Patient/caregiver will verbalize risk factors for pressure ulcer development Date Initiated: 07/30/2020 Target Resolution Date: 03/16/2021 Goal Status: Active Interventions: Provide education on pressure ulcers Notes: Wound/Skin Impairment Nursing Diagnoses: Impaired tissue integrity Goals: Patient/caregiver will verbalize understanding of skin care regimen Date Initiated: 01/05/2021 Target Resolution Date: 03/16/2021 Goal Status: Active Ulcer/skin breakdown will have [name] volume reduction of 50% by week 8 Date Initiated: 07/30/2020 Date Inactivated: 10/20/2020 Target Resolution Date: 09/27/2020 Goal Status: Unmet Unmet Reason:  Infection Ulcer/skin breakdown will have [name] volume reduction of 80% by week 12 Date Initiated: 10/20/2020 Date Inactivated: 12/22/2020 Target Resolution Date: 11/19/2020 Unmet Reason: paraplegic, difficulty Goal Status: Unmet offloading Interventions: Provide education on ulcer and skin care Notes: Electronic Signature(s) Signed: 02/16/2021 5:47:10 PM By: [name] Deed RN, BSN Entered By: [name] on 02/16/2021 09:16:05 -------------------------------------------------------------------------------- Pain Assessment Details Patient Name: Date of Service: [name] March L. 02/16/2021 9:00 [name] Medical Record Number: [id number] Patient Account Number: [id number] Date of Birth/Sex: Treating RN: 27-Jul-1991 (29 y.o. [name] Primary Care Keimon Basaldua: [name] Other Clinician: Referring Lachlyn Vanderstelt: Treating Grizelda Piscopo/Extender: [name] Weeks in Treatment: 28 Active Problems Location of Pain Severity and Description of Pain Patient Has Paino No Site Locations Pain Management and Medication Current Pain Management: Electronic  Signature(s) Signed: 02/16/2021 5:47:10 PM By: [name] Deed RN, BSN Signed: 02/17/2021 9:47:55 AM By: [name] Entered By: [name] on 02/16/2021 09:18:57 -------------------------------------------------------------------------------- Patient/Caregiver Education Details Patient Name: Date of Service: [name] 3/23/2022andnbsp9:00 [name] Medical Record Number: [id number] Patient Account Number: [id number] Date of Birth/Gender: Treating RN: [birth year] (29 y.o. [name] Primary Care Physician: [name] Other Clinician: Referring Physician: Treating Physician/Extender: [name] in Treatment: 28 Education Assessment Education Provided To: Patient Education Topics Provided Pressure: Methods: Explain/Verbal Responses: Reinforcements needed, State content correctly Wound/Skin Impairment: Methods: Explain/Verbal Responses: Reinforcements needed, State content correctly Electronic Signature(s) Signed: 02/16/2021 5:47:10 PM By: [name] Deed RN, BSN Entered By: [name] on 02/16/2021 09:16:32 -------------------------------------------------------------------------------- Wound Assessment Details Patient Name: Date of Service: [name] Corner NIO L. 02/16/2021 9:00 [name] Medical Record Number: [id number] Patient Account Number: [id number] Date of Birth/Sex: Treating RN: 11-08-1991 (29 y.o. [name], [name] Primary Care Travia Onstad: [name] Other Clinician: Referring Geraldean Walen: Treating Sebastien Jackson/Extender: [name] Weeks in Treatment: 28 Wound Status Wound Number: 3 Primary Etiology: Pressure Ulcer Wound Location: Left, Lateral Malleolus Wound Status: Open Wounding Event: Gradually Appeared Comorbid History: Paraplegia Date Acquired: 06/27/2020 Weeks Of Treatment: 28 Clustered Wound: No Photos Wound Measurements Length: (cm) 0.9 Width: (cm) 0.7 Depth: (cm) 0.5 Area:  (cm) 0.495 Volume: (cm) 0.247 % Reduction in Area: 86.3% % Reduction in Volume: 88.6% Epithelialization: Small (1-33%) Tunneling: No Undermining: No Wound Description Classification: Category/Stage IV Wound Margin: Epibole Exudate Amount: Medium Exudate Type: Serosanguineous Exudate Color: red, brown Foul Odor After Cleansing: No Slough/Fibrino Yes Wound Bed Granulation Amount: Large (67-100%) Exposed Structure Granulation Quality: Red Fascia Exposed: No Necrotic Amount: Small (1-33%) Fat Layer (Subcutaneous Tissue) Exposed: Yes Necrotic Quality: Adherent Slough Tendon Exposed: No Muscle Exposed: No Joint Exposed: No Bone Exposed: No Treatment Notes Wound #3 (Malleolus) Wound Laterality: Left, Lateral Cleanser Normal Saline Discharge Instruction: Cleanse the wound with Normal Saline prior to applying [name] clean dressing using gauze sponges, not tissue or cotton balls. Normal Saline Discharge Instruction: Cleanse the wound with Normal Saline prior to applying [name] clean dressing using gauze sponges, not tissue or cotton balls. Peri-Wound Care Topical Primary Dressing Promogran Prisma Matrix, 4.34 (sq in) (silver collagen) Discharge Instruction: Moisten collagen with saline or hydrogel Secondary Dressing ComfortFoam Border, 4x4 in (silicone border) Discharge Instruction: Apply over primary dressing as directed. Secured With Compression Wrap Compression Stockings Facilities manager) Signed: 02/16/2021 5:50:40 PM By: [name] Mu RN Signed: 02/17/2021 9:47:55 AM By: [name] Entered By: [name] on 02/16/2021 16:40:27 -------------------------------------------------------------------------------- Wound Assessment Details Patient Name: Date of Service: [name], [name] 02/16/2021 9:00  [name] Medical Record Number: [id number] Patient Account Number: [id number] Date of Birth/Sex: Treating RN: 02/18/1991 (29 y.o. [name] MerlM) Breedlove,  [name] Primary Care Talayah Picardi: [name] IpStroud, [name] Other Clinician: Referring Markeria Goetsch: Treating Robertlee Rogacki/Extender: [name] PalmsStone III, Hoyt [name], [name] Weeks in Treatment: 28 Wound Status Wound Number: 4 Primary Etiology: Pressure Ulcer Wound Location: Sacrum Wound Status: Open Wounding Event: Gradually Appeared Comorbid History: Paraplegia Date Acquired: 05/27/2020 Weeks Of Treatment: 28 Clustered Wound: No Photos Wound Measurements Length: (cm) 0.4 Width: (cm) 0.3 Depth: (cm) 0.1 Area: (cm) 0.094 Volume: (cm) 0.009 % Reduction in Area: 99.3% % Reduction in Volume: 100% Epithelialization: None Tunneling: No Undermining: No Wound Description Classification: Category/Stage IV Wound Margin: Well defined, not attached Exudate Amount: Medium Exudate Type: Serosanguineous Exudate Color: red, brown Foul Odor After Cleansing: No Slough/Fibrino No Wound Bed Granulation Amount: Large (67-100%) Exposed Structure Granulation Quality: Pink, Pale Fascia Exposed: No Necrotic Amount: None Present (0%) Fat Layer (Subcutaneous Tissue) Exposed: No Tendon Exposed: No Muscle Exposed: No Joint Exposed: No Bone Exposed: No Treatment Notes Wound #4 (Sacrum) Cleanser Peri-Wound Care Topical Primary Dressing Promogran Prisma Matrix, 4.34 (sq in) (silver collagen) Discharge Instruction: to line wound bed, do not moisten, back with dry gauze Secondary Dressing ABD Pad, 5x9 Discharge Instruction: Apply over primary dressing or bordered foam Secured With 78M Medipore H Soft Cloth Surgical T 4 x 2 (in/yd) ape Discharge Instruction: Secure dressing with tape as directed. Compression Wrap Compression Stockings Add-Ons Electronic Signature(s) Signed: 02/16/2021 5:50:40 PM By: [name] MuBreedlove, Lauren RN Signed: 02/17/2021 9:47:55 AM By: [name] Itoawkins, Destiny Entered By: [name] Itoawkins, Destiny on 02/16/2021 16:41:14 -------------------------------------------------------------------------------- Wound  Assessment Details Patient Name: Date of Service: [name], [name] 02/16/2021 9:00 [name] Medical Record Number: [id number] Patient Account Number: [id number] Date of Birth/Sex: Treating RN: 02/24/1991 (29 y.o. [name] MerlM) Breedlove, [name] Primary Care Lorren Rossetti: [name] IpStroud, [name] Other Clinician: Referring Evander Macaraeg: Treating Yoshi Mancillas/Extender: [name] PalmsStone III, Hoyt [name], [name] Weeks in Treatment: 28 Wound Status Wound Number: 6 Primary Etiology: Pressure Ulcer Wound Location: Right Ischium Wound Status: Open Wounding Event: Trauma Comorbid History: Paraplegia Date Acquired: 02/10/2021 Weeks Of Treatment: 0 Clustered Wound: No Photos Wound Measurements Length: (cm) 5 Width: (cm) 3.9 Depth: (cm) 0.1 Area: (cm) 15.315 Volume: (cm) 1.532 % Reduction in Area: 0% % Reduction in Volume: 0% Epithelialization: None Tunneling: No Undermining: No Wound Description Classification: Unstageable/Unclassified Wound Margin: Distinct, outline attached Exudate Amount: Medium Exudate Type: Serosanguineous Exudate Color: red, brown Foul Odor After Cleansing: No Slough/Fibrino Yes Wound Bed Granulation Amount: None Present (0%) Exposed Structure Necrotic Amount: Large (67-100%) Fascia Exposed: No Necrotic Quality: Eschar, Adherent Slough Fat Layer (Subcutaneous Tissue) Exposed: Yes Tendon Exposed: No Muscle Exposed: No Joint Exposed: No Bone Exposed: No Treatment Notes Wound #6 (Ischium) Wound Laterality: Right Cleanser Peri-Wound Care Topical Primary Dressing Santyl Ointment Discharge Instruction: Apply nickel thick amount to wound bed as instructed Secondary Dressing Woven Gauze Sponge, Non-Sterile 4x4 in Discharge Instruction: moisten with saline Apply over santyl ABD Pad, 5x9 Discharge Instruction: Apply over primary dressing as directed. Secured With 78M Medipore H Soft Cloth Surgical T 4 x 2 (in/yd) ape Discharge Instruction: Secure dressing with tape as directed. Compression  Wrap Compression Stockings Add-Ons Electronic Signature(s) Signed: 02/16/2021 5:50:40 PM By: [name] MuBreedlove, Lauren RN Signed: 02/17/2021 9:47:55 AM By: [name] Itoawkins, Destiny Entered By: [name] Itoawkins, Destiny on 02/16/2021 16:40:50 -------------------------------------------------------------------------------- Vitals Details Patient Name: Date of Service: [name], [name] 02/16/2021 9:00 [name] Medical Record Number: [id number] Patient Account Number: [id number] Date of Birth/Sex: Treating RN: 03/24/1991 (29 y.o. [name])  [name] Primary Care Garnet Chatmon: [name] Other Clinician: Referring Airiel Oblinger: Treating Julicia Krieger/Extender: [name] in Treatment: 28 Vital Signs Time Taken: 09:18 Temperature (F): 97.9 Height (in): 71 Pulse (bpm): 105 Weight (lbs): 136 Respiratory Rate (breaths/min): 16 Body Mass Index (BMI): 19 Blood Pressure (mmHg): 128/90 Reference Range: 80 - 120 mg / dl Electronic Signature(s) Signed: 02/17/2021 9:47:55 AM By: [name] Entered By: [name] on 02/16/2021 09:18:50

## 2021-02-22 ENCOUNTER — Telehealth: Payer: Self-pay | Admitting: *Deleted

## 2021-02-22 NOTE — Telephone Encounter (Signed)
[name] called for refill on his tramadol and oxycodone.  Per the PMP his last refills were filled: 01/25/2021 Oxycodone Hcl (Ir) 10 Mg Tab #120.00 01/17/2021  Tramadol Hcl 50 Mg Tablet #240.00   Next appt is 03/21/21.

## 2021-02-23 MED ORDER — TRAMADOL HCL 50 MG PO TABS: 100.0000 mg | ORAL_TABLET | Freq: Four times a day (QID) | ORAL | 5 refills | Status: DC

## 2021-02-23 MED ORDER — OXYCODONE HCL 10 MG PO TABS: 10.0000 mg | ORAL_TABLET | Freq: Four times a day (QID) | ORAL | 0 refills | Status: DC | PRN

## 2021-02-23 NOTE — Telephone Encounter (Signed)
Notified and informed of Tramadol refills.

## 2021-03-02 ENCOUNTER — Encounter (HOSPITAL_BASED_OUTPATIENT_CLINIC_OR_DEPARTMENT_OTHER): Payer: Medicaid Other | Admitting: Physician Assistant

## 2021-03-10 ENCOUNTER — Encounter (HOSPITAL_BASED_OUTPATIENT_CLINIC_OR_DEPARTMENT_OTHER): Payer: No Typology Code available for payment source | Admitting: Internal Medicine

## 2021-03-15 ENCOUNTER — Other Ambulatory Visit: Payer: Self-pay

## 2021-03-15 ENCOUNTER — Encounter (HOSPITAL_BASED_OUTPATIENT_CLINIC_OR_DEPARTMENT_OTHER): Payer: Medicaid Other | Attending: Internal Medicine | Admitting: Internal Medicine

## 2021-03-15 DIAGNOSIS — G8221 Paraplegia, complete: Secondary | ICD-10-CM | POA: Diagnosis not present

## 2021-03-15 DIAGNOSIS — Z7901 Long term (current) use of anticoagulants: Secondary | ICD-10-CM | POA: Diagnosis not present

## 2021-03-15 DIAGNOSIS — E43 Unspecified severe protein-calorie malnutrition: Secondary | ICD-10-CM | POA: Insufficient documentation

## 2021-03-15 DIAGNOSIS — L89154 Pressure ulcer of sacral region, stage 4: Secondary | ICD-10-CM | POA: Insufficient documentation

## 2021-03-15 DIAGNOSIS — L89523 Pressure ulcer of left ankle, stage 3: Secondary | ICD-10-CM | POA: Diagnosis not present

## 2021-03-15 DIAGNOSIS — L89310 Pressure ulcer of right buttock, unstageable: Secondary | ICD-10-CM | POA: Insufficient documentation

## 2021-03-15 DIAGNOSIS — Z8249 Family history of ischemic heart disease and other diseases of the circulatory system: Secondary | ICD-10-CM | POA: Insufficient documentation

## 2021-03-15 DIAGNOSIS — F17200 Nicotine dependence, unspecified, uncomplicated: Secondary | ICD-10-CM | POA: Diagnosis not present

## 2021-03-21 ENCOUNTER — Encounter: Payer: Self-pay | Admitting: Physical Medicine and Rehabilitation

## 2021-03-21 ENCOUNTER — Other Ambulatory Visit: Payer: Self-pay

## 2021-03-21 ENCOUNTER — Encounter
Payer: Medicaid Other | Attending: Physical Medicine and Rehabilitation | Admitting: Physical Medicine and Rehabilitation

## 2021-03-21 VITALS — BP 125/82 | HR 125 | Temp 98.7°F

## 2021-03-21 DIAGNOSIS — M898X9 Other specified disorders of bone, unspecified site: Secondary | ICD-10-CM | POA: Diagnosis present

## 2021-03-21 DIAGNOSIS — S24109D Unspecified injury at unspecified level of thoracic spinal cord, subsequent encounter: Secondary | ICD-10-CM | POA: Insufficient documentation

## 2021-03-21 DIAGNOSIS — S31000D Unspecified open wound of lower back and pelvis without penetration into retroperitoneum, subsequent encounter: Secondary | ICD-10-CM | POA: Diagnosis present

## 2021-03-21 DIAGNOSIS — G8220 Paraplegia, unspecified: Secondary | ICD-10-CM | POA: Insufficient documentation

## 2021-03-21 DIAGNOSIS — Z79891 Long term (current) use of opiate analgesic: Secondary | ICD-10-CM | POA: Diagnosis not present

## 2021-03-21 DIAGNOSIS — R29818 Other symptoms and signs involving the nervous system: Secondary | ICD-10-CM | POA: Diagnosis present

## 2021-03-21 DIAGNOSIS — G894 Chronic pain syndrome: Secondary | ICD-10-CM | POA: Insufficient documentation

## 2021-03-21 DIAGNOSIS — N319 Neuromuscular dysfunction of bladder, unspecified: Secondary | ICD-10-CM | POA: Insufficient documentation

## 2021-03-21 DIAGNOSIS — Z5181 Encounter for therapeutic drug level monitoring: Secondary | ICD-10-CM | POA: Insufficient documentation

## 2021-03-21 MED ORDER — HYDROXYZINE HCL 10 MG PO TABS: 10.0000 mg | ORAL_TABLET | Freq: Every evening | ORAL | 5 refills | Status: DC | PRN

## 2021-03-21 MED ORDER — TIZANIDINE HCL 4 MG PO TABS: 4.0000 mg | ORAL_TABLET | Freq: Two times a day (BID) | ORAL | 11 refills | Status: DC

## 2021-03-21 MED ORDER — BACLOFEN 20 MG PO TABS: 40.0000 mg | ORAL_TABLET | Freq: Four times a day (QID) | ORAL | 11 refills | Status: DC
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
## 2021-03-21 NOTE — Patient Instructions (Signed)
Pt is a22yr old male with T10 ASIA S paraplegia, neurogenic bowel and bladder and back pain here For f/u- also had DVT and Pressure ulcer- sacrum- Heels have healed. 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    Here for f/u on SCI.   1.  Will write Rx for manual w/c-and ROHO and give to pt.  Pt needs ultra light weight manual w/c- he is a T10 paraplegic- and absolutely needs elevating leg rests for fluctuating edema, and spasticity- is  and needs a ROHO w/c cushion- due to Stage IV pressure ulcer on R gluteus maximus- seeing a wound care doctor.  Really needs pressure mapping since has severe heterotopic ossification of L hip , could be contributing to frequent pressure ulcers as well.   2. For constipation- Magnesium citrate- 4 oz at first- chug it- then if no significant results- drink 8 oz- 8-12 hours later. You might need to do bowel program- if doesn't work, CALL ME!!!! Until cleaned out- soups, or liquid food only- nothing solid.  Don't use 1x/week normally. But use if goes >4 days without a bowel program.   3. Senna/Senokot- - 2 tabs 3x/day max- I would start with 2-3 tabs/day initially. Can either take WITH Miralax or instead of.   4.  Do pressure relief every 20 minutes- has gone back to wound care doctor.   5. Continue Tramadol- still has some Oxycodone left- not getting new Rx-   6. Refill Baclofen 40 mg QID 11 refills  7. Refill Zanaflex/tizanidine-8 mg BID-  11 refills   8. Will try Hydroxyzine 10 mg nightly as needed for anxiety and sleep. Will try   9. Has enough tramadol- continue, but won't refill today.   10. Referral to Urology - Dr Sherron Monday- see specifically, this physician, please    11. Another 6 months to send to Ortho in [facility] for HO surgery- needs to mature.   12. F/U in 2 months- double appointment- for SCI.

## 2021-03-25 LAB — DRUG TOX MONITOR 1 W/CONF, ORAL FLD
Amphetamines: NEGATIVE ng/mL (ref <10)
Barbiturates: NEGATIVE ng/mL (ref <10)
Benzodiazepines: NEGATIVE ng/mL (ref <0.50)
Buprenorphine: NEGATIVE ng/mL (ref <0.10)
Cocaine: NEGATIVE ng/mL (ref <5.0)
Codeine: NEGATIVE ng/mL (ref <2.5)
Cotinine: >250 ng/mL — ABNORMAL HIGH (ref <5.0)
Dihydrocodeine: NEGATIVE ng/mL (ref <2.5)
Fentanyl: NEGATIVE ng/mL (ref <0.10)
Heroin Metabolite: NEGATIVE ng/mL (ref <1.0)
Hydrocodone: NEGATIVE ng/mL (ref <2.5)
Hydromorphone: NEGATIVE ng/mL (ref <2.5)
MARIJUANA: NEGATIVE ng/mL (ref <2.5)
MDMA: NEGATIVE ng/mL (ref <10)
Meprobamate: NEGATIVE ng/mL (ref <2.5)
Methadone: NEGATIVE ng/mL (ref <5.0)
Morphine: NEGATIVE ng/mL (ref <2.5)
Nicotine Metabolite: POSITIVE ng/mL — AB (ref <5.0)
Norhydrocodone: NEGATIVE ng/mL (ref <2.5)
Noroxycodone: 61 ng/mL — ABNORMAL HIGH (ref <2.5)
Opiates: POSITIVE ng/mL — AB (ref <2.5)
Oxycodone: >250 ng/mL — ABNORMAL HIGH (ref <2.5)
Oxymorphone: NEGATIVE ng/mL (ref <2.5)
Phencyclidine: NEGATIVE ng/mL (ref <10)
Tapentadol: NEGATIVE ng/mL (ref <5.0)
Tramadol: >500 ng/mL — ABNORMAL HIGH (ref <5.0)
Tramadol: POSITIVE ng/mL — AB (ref <5.0)
Zolpidem: NEGATIVE ng/mL (ref <5.0)

## 2021-03-25 LAB — DRUG TOX ALC METAB W/CON, ORAL FLD: Alcohol Metabolite: NEGATIVE ng/mL (ref <25)

## 2021-03-30 ENCOUNTER — Other Ambulatory Visit: Payer: Self-pay

## 2021-03-30 ENCOUNTER — Encounter (HOSPITAL_BASED_OUTPATIENT_CLINIC_OR_DEPARTMENT_OTHER): Payer: Medicaid Other | Attending: Physician Assistant | Admitting: Physician Assistant

## 2021-03-30 ENCOUNTER — Telehealth: Payer: Self-pay | Admitting: *Deleted

## 2021-03-30 ENCOUNTER — Other Ambulatory Visit (HOSPITAL_COMMUNITY)
Admission: RE | Admit: 2021-03-30 | Discharge: 2021-03-30 | Disposition: A | Discharge status: Home / Self Care | Payer: Medicaid Other | Source: Other Acute Inpatient Hospital | Attending: Physician Assistant | Admitting: Physician Assistant

## 2021-03-30 DIAGNOSIS — E43 Unspecified severe protein-calorie malnutrition: Secondary | ICD-10-CM | POA: Insufficient documentation

## 2021-03-30 DIAGNOSIS — M8668 Other chronic osteomyelitis, other site: Secondary | ICD-10-CM | POA: Diagnosis not present

## 2021-03-30 DIAGNOSIS — L89523 Pressure ulcer of left ankle, stage 3: Secondary | ICD-10-CM | POA: Insufficient documentation

## 2021-03-30 DIAGNOSIS — M869 Osteomyelitis, unspecified: Secondary | ICD-10-CM | POA: Insufficient documentation

## 2021-03-30 DIAGNOSIS — L89154 Pressure ulcer of sacral region, stage 4: Secondary | ICD-10-CM | POA: Diagnosis present

## 2021-03-30 DIAGNOSIS — G8221 Paraplegia, complete: Secondary | ICD-10-CM | POA: Diagnosis not present

## 2021-03-30 DIAGNOSIS — L89310 Pressure ulcer of right buttock, unstageable: Secondary | ICD-10-CM | POA: Insufficient documentation

## 2021-03-30 DIAGNOSIS — Z681 Body mass index (BMI) 19 or less, adult: Secondary | ICD-10-CM | POA: Insufficient documentation

## 2021-03-30 DIAGNOSIS — Z86718 Personal history of other venous thrombosis and embolism: Secondary | ICD-10-CM | POA: Diagnosis not present

## 2021-03-30 DIAGNOSIS — F17200 Nicotine dependence, unspecified, uncomplicated: Secondary | ICD-10-CM | POA: Diagnosis not present

## 2021-03-30 NOTE — Telephone Encounter (Signed)
Oral swab drug screen was consistent for prescribed medications.  ?

## 2021-03-30 NOTE — Progress Notes (Signed)
CARSTON, RIEDL ([id number]) Visit Report for 03/30/2021 Arrival Information Details Patient Name: Date of Service: PELLEGRINO, KENNARD 03/30/2021 1:30 PM Medical Record Number: [id number] Patient Account Number: [id number] Date of Birth/Sex: Treating RN: 03-20-1991 (29 y.o. [name], [name] Primary Care Katelynne Revak: PCP, NO Other Clinician: Referring Jaimie Redditt: Treating Cap Massi/Extender: [name] in Treatment: 34 Visit Information History Since Last Visit Added or deleted any medications: No Patient Arrived: Wheel Chair Any new allergies or adverse reactions: No Arrival Time: 13:42 Had a fall or experienced change in No Accompanied By: self activities of daily living that may affect Transfer Assistance: None risk of falls: Patient Identification Verified: Yes Signs or symptoms of abuse/neglect since last visito No Secondary Verification Process Completed: Yes Hospitalized since last visit: No Patient Requires Transmission-Based Precautions: No Implantable device outside of the clinic excluding No Patient Has Alerts: No cellular tissue based products placed in the center since last visit: Has Dressing in Place as Prescribed: Yes Pain Present Now: No Electronic Signature(s) Signed: 03/30/2021 6:54:10 PM By: Shawn Stall Entered By: Shawn Stall on 03/30/2021 13:50:57 -------------------------------------------------------------------------------- Lower Extremity Assessment Details Patient Name: Date of Service: MUSAB, WINGARD L. 03/30/2021 1:30 PM Medical Record Number: [id number] Patient Account Number: [id number] Date of Birth/Sex: Treating RN: 24-Feb-1991 (29 y.o. [name] Primary Care Elius Etheredge: PCP, NO Other Clinician: Referring Aleeah Greeno: Treating Stewart Sasaki/Extender: Angus Palms Weeks in Treatment: 34 Edema Assessment Assessed: [Left: Yes] [Right: No] Edema: [Left: Yes] [Right: No] Calf Left: Right: Point of  Measurement: 47 cm From Medial Instep 28 cm Ankle Left: Right: Point of Measurement: 12 cm From Medial Instep 21.5 cm Vascular Assessment Pulses: Dorsalis Pedis Palpable: [Left:Yes] Electronic Signature(s) Signed: 03/30/2021 6:54:10 PM By: Shawn Stall Entered By: Shawn Stall on 03/30/2021 13:51:35 -------------------------------------------------------------------------------- Multi-Disciplinary Care Plan Details Patient Name: Date of Service: [name] 03/30/2021 1:30 PM Medical Record Number: [id number] Patient Account Number: [id number] Date of Birth/Sex: Treating RN: August 23, 1991 (29 y.o. [name] Primary Care Leara Rawl: PCP, NO Other Clinician: Referring Swayze Kozuch: Treating Sony Schlarb/Extender: [name] in Treatment: 34 Multidisciplinary Care Plan reviewed with physician Active Inactive Pressure Nursing Diagnoses: Knowledge deficit related to causes and risk factors for pressure ulcer development Goals: Patient/caregiver will verbalize risk factors for pressure ulcer development Date Initiated: 07/30/2020 Target Resolution Date: 04/27/2021 Goal Status: Active Interventions: Provide education on pressure ulcers Notes: Wound/Skin Impairment Nursing Diagnoses: Impaired tissue integrity Goals: Patient/caregiver will verbalize understanding of skin care regimen Date Initiated: 01/05/2021 Target Resolution Date: 04/27/2021 Goal Status: Active Ulcer/skin breakdown will have a volume reduction of 50% by week 8 Date Initiated: 07/30/2020 Date Inactivated: 10/20/2020 Target Resolution Date: 09/27/2020 Goal Status: Unmet Unmet Reason: Infection Ulcer/skin breakdown will have a volume reduction of 80% by week 12 Date Initiated: 10/20/2020 Date Inactivated: 12/22/2020 Target Resolution Date: 11/19/2020 Unmet Reason: paraplegic, difficulty Goal Status: Unmet offloading Interventions: Provide education on ulcer and skin  care Notes: Electronic Signature(s) Signed: 03/30/2021 7:02:06 PM By: Zenaida Deed RN, BSN Entered By: Zenaida Deed on 03/30/2021 14:22:50 -------------------------------------------------------------------------------- Pain Assessment Details Patient Name: Date of Service: [name] 03/30/2021 1:30 PM Medical Record Number: [id number] Patient Account Number: [id number] Date of Birth/Sex: Treating RN: Apr 02, 1991 (29 y.o. [name] Primary Care Dakhari Zuver: PCP, NO Other Clinician: Referring Stacyann Mcconaughy: Treating Taleen Prosser/Extender: [name] in Treatment: 34 Active Problems Location of Pain Severity and Description of Pain Patient Has Paino No Site  Locations Rate the pain. Current Pain Level: 0 Pain Management and Medication Current Pain Management: Medication: No Cold Application: No Rest: No Massage: No Activity: No T.E.N.S.: No Heat Application: No Leg drop or elevation: No Is the Current Pain Management Adequate: Adequate How does your wound impact your activities of daily livingo Sleep: No Bathing: No Appetite: No Relationship With Others: No Bladder Continence: No Emotions: No Bowel Continence: No Work: No Toileting: No Drive: No Dressing: No Hobbies: No Electronic Signature(s) Signed: 03/30/2021 6:54:10 PM By: Shawn Stall Entered By: Shawn Stall on 03/30/2021 13:51:26 -------------------------------------------------------------------------------- Patient/Caregiver Education Details Patient Name: Date of Service: [name] 5/4/2022andnbsp1:30 PM Medical Record Number: [id number] Patient Account Number: [id number] Date of Birth/Gender: Treating RN: 1991/04/15 (29 y.o. [name] Primary Care Physician: PCP, NO Other Clinician: Referring Physician: Treating Physician/Extender: [name] in Treatment: 34 Education Assessment Education Provided  To: Patient Education Topics Provided Infection: Methods: Explain/Verbal Responses: Reinforcements needed, State content correctly Pressure: Methods: Explain/Verbal Responses: Reinforcements needed, State content correctly Wound/Skin Impairment: Methods: Explain/Verbal Responses: Reinforcements needed, State content correctly Electronic Signature(s) Signed: 03/30/2021 7:02:06 PM By: Zenaida Deed RN, BSN Entered By: Zenaida Deed on 03/30/2021 14:25:02 -------------------------------------------------------------------------------- Wound Assessment Details Patient Name: Date of Service: [name] 03/30/2021 1:30 PM Medical Record Number: [id number] Patient Account Number: [id number] Date of Birth/Sex: Treating RN: 08-02-1991 (29 y.o. [name] Primary Care Tenika Keeran: PCP, NO Other Clinician: Referring Byanka Landrus: Treating Adael Culbreath/Extender: Angus Palms Weeks in Treatment: 34 Wound Status Wound Number: 3 Primary Etiology: Pressure Ulcer Wound Location: Left, Lateral Malleolus Wound Status: Open Wounding Event: Gradually Appeared Comorbid History: Paraplegia Date Acquired: 06/27/2020 Weeks Of Treatment: 34 Clustered Wound: No Photos Wound Measurements Length: (cm) 0.2 Width: (cm) 0.3 Depth: (cm) 0.2 Area: (cm) 0.047 Volume: (cm) 0.009 % Reduction in Area: 98.7% % Reduction in Volume: 99.6% Epithelialization: Large (67-100%) Tunneling: No Undermining: No Wound Description Classification: Category/Stage IV Wound Margin: Epibole Exudate Amount: Medium Exudate Type: Serosanguineous Exudate Color: red, brown Foul Odor After Cleansing: No Slough/Fibrino Yes Wound Bed Granulation Amount: Large (67-100%) Exposed Structure Granulation Quality: Red Fascia Exposed: No Necrotic Amount: Small (1-33%) Fat Layer (Subcutaneous Tissue) Exposed: Yes Necrotic Quality: Adherent Slough Tendon Exposed: No Muscle Exposed: No Joint Exposed:  No Bone Exposed: No Assessment Notes maceration present to periwound. Electronic Signature(s) Signed: 03/30/2021 4:55:09 PM By: [name] Ito Signed: 03/30/2021 6:54:10 PM By: Shawn Stall Entered By: [name] Ito on 03/30/2021 16:31:44 -------------------------------------------------------------------------------- Wound Assessment Details Patient Name: Date of Service: GAREY, ALLEVA NTO NIO L. 03/30/2021 1:30 PM Medical Record Number: [id number] Patient Account Number: [id number] Date of Birth/Sex: Treating RN: 1991-03-13 (29 y.o. [name] Primary Care Cagney Degrace: PCP, NO Other Clinician: Referring Dontell Mian: Treating Navreet Bolda/Extender: Angus Palms Weeks in Treatment: 34 Wound Status Wound Number: 6 Primary Etiology: Pressure Ulcer Wound Location: Right Ischium Wound Status: Open Wounding Event: Trauma Comorbid History: Paraplegia Date Acquired: 02/10/2021 Weeks Of Treatment: 6 Clustered Wound: No Photos Wound Measurements Length: (cm) 6.3 Width: (cm) 4.9 Depth: (cm) 3 Area: (cm) 24.245 Volume: (cm) 72.736 % Reduction in Area: -58.3% % Reduction in Volume: -4647.8% Epithelialization: None Tunneling: No Undermining: Yes Starting Position (o'clock): 12 Ending Position (o'clock): 3 Maximum Distance: (cm) 3.5 Wound Description Classification: Category/Stage IV Wound Margin: Distinct, outline attached Exudate Amount: Large Exudate Type: Purulent Exudate Color: yellow, brown, green Foul Odor After Cleansing: Yes Due to Product Use: Yes Slough/Fibrino Yes Wound Bed Granulation Amount: Medium (  34-66%) Exposed Structure Granulation Quality: Pink Fascia Exposed: No Necrotic Amount: Medium (34-66%) Fat Layer (Subcutaneous Tissue) Exposed: Yes Necrotic Quality: Adherent Slough Tendon Exposed: Yes Muscle Exposed: No Joint Exposed: No Bone Exposed: No Electronic Signature(s) Signed: 03/30/2021 4:55:09 PM By: [name] Ito Signed:  03/30/2021 6:54:10 PM By: Shawn Stall Entered By: [name] Ito on 03/30/2021 16:32:02 -------------------------------------------------------------------------------- Vitals Details Patient Name: Date of Service: [name] 03/30/2021 1:30 PM Medical Record Number: [id number] Patient Account Number: [id number] Date of Birth/Sex: Treating RN: 09/12/1991 (29 y.o. [name] Primary Care Dajanee Voorheis: PCP, NO Other Clinician: Referring Fredia Chittenden: Treating Joellyn Grandt/Extender: [name] in Treatment: 34 Vital Signs Time Taken: 13:42 Temperature (F): 98.8 Height (in): 71 Pulse (bpm): 114 Weight (lbs): 136 Respiratory Rate (breaths/min): 20 Body Mass Index (BMI): 19 Blood Pressure (mmHg): 135/78 Reference Range: 80 - 120 mg / dl Electronic Signature(s) Signed: 03/30/2021 6:54:10 PM By: Shawn Stall Entered By: Shawn Stall on 03/30/2021 13:51:17

## 2021-03-30 NOTE — Progress Notes (Addendum)
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 is great news. Overall I think that he is making good progress here. I do not even know the skin needed flap in regard to the left sacral region. Nonetheless I do think that he is having some issues he tells me what he feels like may be a dislocation of his right hip I think he needs to see orthopedics as soon as possible in that regard. T give him information for that today. o 01/05/2021 upon evaluation today patient appears to be doing well with regard to his wound. He has been tolerating the dressing changes without complication both in regard to the sacral region and the ankle although he has not gotten the Santyl we really need to see about getting that as soon as possible. He did receive a call from the pharmacy she just did not get the prescription at that point. 01/19/2021 upon evaluation today patient appears to be doing well with regard to his wounds currently. Both appear to be fairly clean. Fortunately there is no signs of  active infection at this time. No fevers, chills, nausea, vomiting, or diarrhea. 02/16/2021 upon evaluation today patient appears to be doing decently well in regard to the sacral wound as well as his ankle wound. Both are showing signs of significant improvement which is great news and I am pleased in that regard. There does not appear  to be any evidence of infection which is also excellent news. Unfortunately he has a right ischial ulcer which is new and unfortunately I think this is also unstageable which means we are unsure how deep this is really the end up being. Obviously I think this is a big deal. 4/19; patient presents for evaluation of his right ischial ulcer and right ankle wound. He has been using Santyl to the right ischial ulcer and collagen to the ankle wound. He reports no issues today. 03/30/2021 upon evaluation today patient appears to be doing worse in regard to his wound in the right ischial location. Fortunately there does not appear to be any signs of active infection at this time which is great news systemically though locally I feel like this likely is infected. I am can obtain a culture today to see what shows so we can adjust and treat him appropriately. With that being said the ankle appears to be doing okay and there was a gluteal region on the right that was in question but I do not see anything that is actually open here. Objective Constitutional Well-nourished and well-hydrated in no acute distress. Vitals Time Taken: 1:42 PM, Height: 71 in, Weight: 136 lbs, BMI: 19, Temperature: 98.8 F, Pulse: 114 bpm, Respiratory Rate: 20 breaths/min, Blood Pressure: 135/78 mmHg. Respiratory normal breathing without difficulty. Psychiatric this patient is able to make decisions and demonstrates good insight into disease process. Alert and Oriented x 3. pleasant and cooperative. General Notes: Upon inspection patient's wound bed actually showed signs of good granulation  epithelization at this point. There does not appear to be any evidence of active infection which is great news and overall very pleased with where things stand today. No fevers, chills, nausea, vomiting, or diarrhea. This is in regard to the ankle. With that being said the right ischial location unfortunately has not been doing nearly as well. There is a lot of necrotic tissue, odor, and there in fact is necrotic muscle and tendon noted as well. I think we need to try to work to get this cleaned out quite a bit here. With that being said I also think that offloading is can be of utmost importance. All of this was discussed today with the patient. Integumentary (Hair, Skin) Wound #3 status is Open. Original cause of wound was Gradually Appeared. The date acquired was: 06/27/2020. The wound has been in treatment 34 weeks. The wound is located on the Left,Lateral Malleolus. The wound measures 0.2cm length x 0.3cm width x 0.2cm depth; 0.047cm^2 area and 0.009cm^3 volume. There is Fat Layer (Subcutaneous Tissue) exposed. There is no tunneling or undermining noted. There is a medium amount of serosanguineous drainage noted. The wound margin is epibole. There is large (67-100%) red granulation within the wound bed. There is a small (1-33%) amount of necrotic tissue within the wound bed including Adherent Slough. General Notes: maceration present to periwound. Wound #6 status is Open. Original cause of wound was Trauma. The date acquired was: 02/10/2021. The wound has been in treatment 6 weeks. The wound is located on the Right Ischium. The wound measures 6.3cm length x 4.9cm width x 3cm depth; 24.245cm^2 area and 72.736cm^3 volume. There is tendon and Fat Layer (Subcutaneous Tissue) exposed. There is no tunneling noted, however, there is undermining starting at 12:00 and ending at 3:00 with a maximum distance of 3.5cm. There is a large amount of purulent drainage noted. Foul odor after cleansing was noted. Foul  odor after cleansing was noted due  to product use. The wound margin is distinct with the outline attached to the wound base. There is medium (34-66%) pink granulation within the wound bed. There is a medium (34-66%) amount of necrotic tissue within the wound bed including Adherent Slough. Assessment Active Problems ICD-10 Pressure ulcer of sacral region, stage 4 Pressure ulcer of left ankle, stage 3 Pressure ulcer of right buttock, unstageable Unspecified severe protein-calorie malnutrition Paraplegia, complete Other chronic osteomyelitis, other site Procedures Wound #6 Pre-procedure diagnosis of Wound #6 is a Pressure Ulcer located on the Right Ischium . There was a Excisional Skin/Subcutaneous Tissue/Muscle Debridement with a total area of 4 sq cm performed by Lenda Kelp, PA. With the following instrument(s): Forceps, and Scissors to remove Viable and Non-Viable tissue/material. Material removed includes Muscle, T endon, Subcutaneous Tissue, and Slough. 1 specimen was taken by a Swab and sent to the lab per facility protocol. A time out was conducted at 14:30, prior to the start of the procedure. There was no bleeding. The procedure was tolerated well with a pain level of Insensate throughout and a pain level of Insensate following the procedure. Post Debridement Measurements: 6.3cm length x 4.9cm width x 3cm depth; 72.736cm^3 volume. Post debridement Stage noted as Category/Stage IV. Character of Wound/Ulcer Post Debridement requires further debridement. Post procedure Diagnosis Wound #6: Same as Pre-Procedure Plan Follow-up Appointments: Return Appointment in 1 week. Bathing/ Shower/ Hygiene: May shower with protection but do not get wound dressing(s) wet. Off-Loading: Turn and reposition every 2 hours - be sure to lift up off the chair with arms every hour while in wheelchair Other: - pillows under left calf to keep pressure of left lateral ankle Non Wound Condition: Protect  area with: - Protect sacrum with vaseline or zinc oxide Laboratory ordered were: Anerobic culture - right ischium The following medication(s) was prescribed: Dakin's Solution miscellaneous 0.25 % solution solution miscellaneous Moisten gauze with Dakin's solution then wring out leaving gauze damp not saturated before packing into the wound bed starting 03/30/2021 WOUND #3: - Malleolus Wound Laterality: Left, Lateral Cleanser: Normal Saline Every Other Day/30 Days Discharge Instructions: Cleanse the wound with Normal Saline prior to applying a clean dressing using gauze sponges, not tissue or cotton balls. Prim Dressing: Promogran Prisma Matrix, 4.34 (sq in) (silver collagen) Every Other Day/30 Days ary Discharge Instructions: Moisten collagen with saline or hydrogel Secondary Dressing: ComfortFoam Border, 4x4 in (silicone border) Every Other Day/30 Days Discharge Instructions: Apply over primary dressing as directed. WOUND #6: - Ischium Wound Laterality: Right Cleanser: Normal Saline 1 x Per Day/30 Days Discharge Instructions: Cleanse the wound with Normal Saline prior to applying a clean dressing using gauze sponges, not tissue or cotton balls. Prim Dressing: Dakin's Solution 0.25%, 16 (oz) 1 x Per Day/30 Days ary Discharge Instructions: Moisten gauze with Dakin's solution and pack lightly into wound Secondary Dressing: ABD Pad, 5x9 1 x Per Day/30 Days Discharge Instructions: Apply over primary dressing as directed. Secured With: 35M Medipore H Soft Cloth Surgical T 4 x 2 (in/yd) 1 x Per Day/30 Days ape Discharge Instructions: Secure dressing with tape as directed. 1. Would recommend that we initiate treatment with Dakin's solution I think this is good to be the ideal thing currently the patient and they are in agreement with the plan. I am going to go ahead and get that sent into the pharmacy although is not a prescription I am not sure if Medicaid will help with this or not to be perfectly  honest. 2. I am  also can recommend at this time that we go ahead and have the patient continue with the silver collagen for the ankle area I think that is appropriate he seems to be doing okay in that regard. 3. Needs to continue with appropriate offloading. I think this is of utmost importance. 4. I did obtain a wound culture today this will be sent in for evaluation and depending on the results of the culture will make any adjustments in antibiotic therapy necessary at that time. 5. I am also going to recommend that the patient continue to monitor for any signs of worsening in general with regard to fevers, chills, nausea, vomiting, or diarrhea. If any of this occurs he should go to the ER soon as possible. We will see patient back for reevaluation in 1 week here in the clinic. If anything worsens or changes patient will contact our office for additional recommendations. Electronic Signature(s) Signed: 03/30/2021 2:46:43 PM By: Lenda Kelp PA-C Entered By: Lenda Kelp on 03/30/2021 14:46:43 -------------------------------------------------------------------------------- SuperBill Details Patient Name: Date of Service: [name] 03/30/2021 Medical Record Number: [id number] Patient Account Number: [id number] Date of Birth/Sex: Treating RN: Oct 08, 1991 (30 y.o. [name] Primary Care Provider: PCP, NO Other Clinician: Referring Provider: Treating Provider/Extender: [name] in Treatment: 34 Diagnosis Coding ICD-10 Codes Code Description L89.154 Pressure ulcer of sacral region, stage 4 L89.523 Pressure ulcer of left ankle, stage 3 L89.310 Pressure ulcer of right buttock, unstageable E43 Unspecified severe protein-calorie malnutrition G82.21 Paraplegia, complete M86.68 Other chronic osteomyelitis, other site Facility Procedures CPT4 Code: [id number] Description: 11043 - DEB MUSC/FASCIA 20 SQ CM/< ICD-10 Diagnosis Description L89.310  Pressure ulcer of right buttock, unstageable Modifier: Quantity: 1 Physician Procedures : CPT4 Code Description Modifier [id number] - WC PHYS LEVEL 4 - EST PT 25 ICD-10 Diagnosis Description L89.154 Pressure ulcer of sacral region, stage 4 L89.523 Pressure ulcer of left ankle, stage 3 L89.310 Pressure ulcer of right buttock, unstageable  E43 Unspecified severe protein-calorie malnutrition Quantity: 1 : [id number] 11043 - WC PHYS DEBR MUSCLE/FASCIA 20 SQ CM ICD-10 Diagnosis Description L89.310 Pressure ulcer of right buttock, unstageable Quantity: 1 Electronic Signature(s) Signed: 03/30/2021 2:46:57 PM By: Lenda Kelp PA-C Entered By: Lenda Kelp on 03/30/2021 14:46:57

## 2021-03-31 DIAGNOSIS — N319 Neuromuscular dysfunction of bladder, unspecified: Secondary | ICD-10-CM

## 2021-03-31 DIAGNOSIS — G8221 Paraplegia, complete: Secondary | ICD-10-CM

## 2021-03-31 MED ORDER — TRAMADOL HCL 50 MG PO TABS: 100.0000 mg | ORAL_TABLET | Freq: Four times a day (QID) | ORAL | 5 refills | Status: DC

## 2021-04-01 MED ORDER — OXYCODONE HCL 10 MG PO TABS: 10.0000 mg | ORAL_TABLET | Freq: Four times a day (QID) | ORAL | 0 refills | Status: DC | PRN

## 2021-04-01 NOTE — Addendum Note (Signed)
Addended by: Genice Rouge on: 04/01/2021 09:59 AM   Modules accepted: Orders

## 2021-04-01 NOTE — Progress Notes (Signed)
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 excellent news. Unfortunately he has a right ischial ulcer which is new and unfortunately I think this is also unstageable which means we are unsure how deep this is really the end up being. Obviously I think this is a big deal. 4/19; patient presents for evaluation of his right ischial ulcer and right ankle wound. He has been using Santyl to the right ischial ulcer and collagen to the ankle wound. He reports no issues today. Patient History Information obtained from Patient. Family History Hypertension - Mother, No family history of Cancer, Diabetes, Heart Disease, Hereditary Spherocytosis, Kidney Disease, Lung Disease, Seizures, Stroke, Thyroid Problems, Tuberculosis. Social History Current every day smoker, Marital Status - Single, Alcohol Use - Never, Drug Use - Current History - pot, Caffeine Use - Rarely. Medical History Eyes Denies history of Cataracts, Glaucoma, Optic Neuritis Ear/Nose/Mouth/Throat Denies history of Chronic  sinus problems/congestion, Middle ear problems Hematologic/Lymphatic Denies history of Anemia, Hemophilia, Human Immunodeficiency Virus, Lymphedema, Sickle Cell Disease Respiratory Denies history of Aspiration, Asthma, Chronic Obstructive Pulmonary Disease (COPD), Pneumothorax, Sleep Apnea, Tuberculosis Cardiovascular Denies history of Angina, Arrhythmia, Congestive Heart Failure, Coronary Artery Disease, Deep Vein Thrombosis, Hypertension, Hypotension, Myocardial Infarction, Peripheral Arterial Disease, Peripheral Venous Disease, Phlebitis, Vasculitis Gastrointestinal Denies history of Cirrhosis , Colitis, Crohnoos, Hepatitis A, Hepatitis B, Hepatitis C Endocrine Denies history of Type I Diabetes, Type II Diabetes Genitourinary Denies history of End Stage Renal Disease Immunological Denies history of Lupus Erythematosus, Raynaudoos, Scleroderma Integumentary (Skin) Denies history of  History of Burn Musculoskeletal Denies history of Gout, Rheumatoid Arthritis, Osteoarthritis, Osteomyelitis Neurologic Patient has history of Paraplegia Denies history of Dementia, Neuropathy, Quadriplegia, Seizure Disorder Oncologic Denies history of Received Chemotherapy, Received Radiation Psychiatric Denies history of Anorexia/bulimia, Confinement Anxiety Objective Constitutional respirations regular, non-labored and within target range for patient.. Vitals Time Taken: 3:52 PM, Height: 71 in, Weight: 136 lbs, BMI: 19, Temperature: 97.4 F, Pulse: 96 bpm, Respiratory Rate: 16 breaths/min, Blood Pressure: 124/84 mmHg. Psychiatric pleasant and cooperative. General Notes: Sacral region: Epithelialized tissue to the previous wound site Right ischial ulcer: There is necrotic tissue throughout and this was debrided. Left lateral malleolus: Healthy granulation tissue present Integumentary (Hair, Skin) Wound #3 status is Open. Original cause of wound was Gradually Appeared. The date acquired was:  06/27/2020. The wound has been in treatment 32 weeks. The wound is located on the Left,Lateral Malleolus. The wound measures 0.4cm length x 1cm width x 0.3cm depth; 0.314cm^2 area and 0.094cm^3 volume. There is Fat Layer (Subcutaneous Tissue) exposed. There is no tunneling or undermining noted. There is a medium amount of serosanguineous drainage noted. The wound margin is epibole. There is large (67-100%) red granulation within the wound bed. There is a small (1-33%) amount of necrotic tissue within the wound bed including Adherent Slough. Wound #4 status is Healed - Epithelialized. Original cause of wound was Gradually Appeared. The date acquired was: 05/27/2020. The wound has been in treatment 32 weeks. The wound is located on the Sacrum. The wound measures 0cm length x 0cm width x 0cm depth; 0cm^2 area and 0cm^3 volume. There is no tunneling or undermining noted. There is a small amount of serosanguineous drainage noted. The wound margin is well defined and not attached to the wound base. There is large (67-100%) pink, pale granulation within the wound bed. There is no necrotic tissue within the wound bed. Wound #6 status is Open. Original cause of wound was Trauma. The date acquired was: 02/10/2021. The wound has been in treatment 3 weeks. The wound is located on the Right Ischium. The wound measures 5.9cm length x 5.5cm width x 1.7cm depth; 25.486cm^2 area and 43.326cm^3 volume. There is Fat Layer (Subcutaneous Tissue) exposed. There is no tunneling or undermining noted. There is a medium amount of serosanguineous drainage noted. The wound margin is distinct with the outline attached to the wound base. There is small (1-33%) pink granulation within the wound bed. There is a large (67-100%) amount of necrotic tissue within the wound bed including Adherent Slough. Assessment Active Problems ICD-10 Pressure ulcer of sacral region, stage 4 Pressure ulcer of left ankle, stage 3 Pressure ulcer of right  buttock, unstageable Unspecified severe protein-calorie malnutrition Paraplegia, complete Other chronic osteomyelitis, other site The pressure ulcer of the sacral region has epithelialized. He has a right ischial ulcer that has necrotic tissue and this was debrided. We will continue Santyl to this wound. The left ankle wound looks well-healing and will continue collagen to this. Procedures Wound #6 Pre-procedure diagnosis of Wound #6 is a Pressure Ulcer located on the Right Ischium . There was a Excisional Skin/Subcutaneous Tissue Debridement with a total area of 32.45 sq cm performed by Geralyn Corwin, DO. With the following instrument(s): Forceps, and Scissors to remove Viable and Non-Viable tissue/material. Material removed includes Subcutaneous Tissue, Slough, Skin: Dermis, and Skin: Epidermis after achieving pain control using Lidocaine. No specimens were taken. A time out was conducted at 16:27, prior to the start of the procedure. A Minimum amount of bleeding was controlled  with Pressure. The procedure was tolerated well with a pain level of 0 throughout and a pain level of 0 following the procedure. Post Debridement Measurements: 5.9cm length x 5.5cm width x 1.7cm depth; 43.326cm^3 volume. Post debridement Stage noted as Unstageable/Unclassified. Character of Wound/Ulcer Post Debridement is improved. Post procedure Diagnosis Wound #6: Same as Pre-Procedure Plan Follow-up Appointments: Return Appointment in 2 weeks. - on Wednesday's Bathing/ Shower/ Hygiene: May shower with protection but do not get wound dressing(s) wet. Off-Loading: Turn and reposition every 2 hours - be sure to lift up with arms every hour while in wheelchair Other: - pillows under left calf to keep pressure of left lateral ankle Non Wound Condition: Protect area with: - Protect sacrum with vaseline or zinc oxide and SAF. WOUND #3: - Malleolus Wound Laterality: Left, Lateral Cleanser: Normal Saline Every Other  Day/30 Days Discharge Instructions: Cleanse the wound with Normal Saline prior to applying a clean dressing using gauze sponges, not tissue or cotton balls. Cleanser: Normal Saline Every Other Day/30 Days Discharge Instructions: Cleanse the wound with Normal Saline prior to applying a clean dressing using gauze sponges, not tissue or cotton balls. Prim Dressing: Promogran Prisma Matrix, 4.34 (sq in) (silver collagen) Every Other Day/30 Days ary Discharge Instructions: Moisten collagen with saline or hydrogel Secondary Dressing: ComfortFoam Border, 4x4 in (silicone border) Every Other Day/30 Days Discharge Instructions: Apply over primary dressing as directed. WOUND #6: - Ischium Wound Laterality: Right Cleanser: Normal Saline 1 x Per Day/30 Days Discharge Instructions: Cleanse the wound with Normal Saline prior to applying a clean dressing using gauze sponges, not tissue or cotton balls. Prim Dressing: Santyl Ointment 1 x Per Day/30 Days ary Discharge Instructions: Apply nickel thick amount to wound bed as instructed Secondary Dressing: Woven Gauze Sponge, Non-Sterile 4x4 in 1 x Per Day/30 Days Discharge Instructions: moisten with saline Apply over santyl Secondary Dressing: ABD Pad, 5x9 1 x Per Day/30 Days Discharge Instructions: Apply over primary dressing as directed. Secured With: 50M Medipore H Soft Cloth Surgical T 4 x 2 (in/yd) 1 x Per Day/30 Days ape Discharge Instructions: Secure dressing with tape as directed. 1. Continue Santyl daily to the right ischial ulcer 2. Continue collagen every other day to the left ankle wound 3. Follow-up in 2 weeks Electronic Signature(s) Signed: 04/01/2021 2:12:29 PM By: Geralyn Corwin DO Entered By: Geralyn Corwin on 03/18/2021 17:33:47 -------------------------------------------------------------------------------- HxROS Details Patient Name: Date of Service: [name] March L. 03/15/2021 3:30 PM Medical Record Number: [id number] Patient  Account Number: [id number] Date of Birth/Sex: Treating RN: 1991-08-04 (29 y.o. [name] Primary Care Provider: PCP, NO Other Clinician: Referring Provider: Treating Provider/Extender: Malachi Pro in Treatment: 32 Information Obtained From Patient Eyes Medical History: Negative for: Cataracts; Glaucoma; Optic Neuritis Ear/Nose/Mouth/Throat Medical History: Negative for: Chronic sinus problems/congestion; Middle ear problems Hematologic/Lymphatic Medical History: Negative for: Anemia; Hemophilia; Human Immunodeficiency Virus; Lymphedema; Sickle Cell Disease Respiratory Medical History: Negative for: Aspiration; Asthma; Chronic Obstructive Pulmonary Disease (COPD); Pneumothorax; Sleep Apnea; Tuberculosis Cardiovascular Medical History: Negative for: Angina; Arrhythmia; Congestive Heart Failure; Coronary Artery Disease; Deep Vein Thrombosis; Hypertension; Hypotension; Myocardial Infarction; Peripheral Arterial Disease; Peripheral Venous Disease; Phlebitis; Vasculitis Gastrointestinal Medical History: Negative for: Cirrhosis ; Colitis; Crohns; Hepatitis A; Hepatitis B; Hepatitis C Endocrine Medical History: Negative for: Type I Diabetes; Type II Diabetes Genitourinary Medical History: Negative for: End Stage Renal Disease Immunological Medical History: Negative for: Lupus Erythematosus; Raynauds; Scleroderma Integumentary (Skin) Medical History: Negative for: History of Burn Musculoskeletal Medical History:  Negative for: Gout; Rheumatoid Arthritis; Osteoarthritis; Osteomyelitis Neurologic Medical History: Positive for: Paraplegia Negative for: Dementia; Neuropathy; Quadriplegia; Seizure Disorder Oncologic Medical History: Negative for: Received Chemotherapy; Received Radiation Psychiatric Medical History: Negative for: Anorexia/bulimia; Confinement Anxiety Immunizations Pneumococcal Vaccine: Received Pneumococcal Vaccination:  No Implantable Devices None Family and Social History Cancer: No; Diabetes: No; Heart Disease: No; Hereditary Spherocytosis: No; Hypertension: Yes - Mother; Kidney Disease: No; Lung Disease: No; Seizures: No; Stroke: No; Thyroid Problems: No; Tuberculosis: No; Current every day smoker; Marital Status - Single; Alcohol Use: Never; Drug Use: Current History - pot; Caffeine Use: Rarely; Financial Concerns: No; Food, Clothing or Shelter Needs: No; Support System Lacking: No; Transportation Concerns: No Electronic Signature(s) Signed: 04/01/2021 2:12:29 PM By: Geralyn Corwin DO Signed: 04/01/2021 3:14:58 PM By: Fonnie Mu RN Entered By: Geralyn Corwin on 03/18/2021 17:28:25 -------------------------------------------------------------------------------- SuperBill Details Patient Name: Date of Service: [name] March L. 03/15/2021 Medical Record Number: [id number] Patient Account Number: [id number] Date of Birth/Sex: Treating RN: 08/07/1991 (29 y.o. [name] Primary Care Provider: PCP, NO Other Clinician: Referring Provider: Treating Provider/Extender: Malachi Pro in Treatment: 32 Diagnosis Coding ICD-10 Codes Code Description L89.154 Pressure ulcer of sacral region, stage 4 L89.523 Pressure ulcer of left ankle, stage 3 E43 Unspecified severe protein-calorie malnutrition G82.21 Paraplegia, complete M86.68 Other chronic osteomyelitis, other site Facility Procedures CPT4 Code: [id number] Description: 11042 - DEB SUBQ TISSUE 20 SQ CM/< ICD-10 Diagnosis Description L89.154 Pressure ulcer of sacral region, stage 4 Modifier: Quantity: 1 CPT4 Code: [id number] Description: 11045 - DEB SUBQ TISS EA ADDL 20CM ICD-10 Diagnosis Description L89.154 Pressure ulcer of sacral region, stage 4 Modifier: Quantity: 1 Physician Procedures : CPT4 Code Description Modifier [id number] 11042 - WC PHYS SUBQ TISS 20 SQ CM ICD-10 Diagnosis Description L89.154 Pressure  ulcer of sacral region, stage 4 Quantity: 1 : [id number] 11045 - WC PHYS SUBQ TISS EA ADDL 20 CM ICD-10 Diagnosis Description L89.154 Pressure ulcer of sacral region, stage 4 Quantity: 1 Electronic Signature(s) Signed: 04/01/2021 2:12:29 PM By: Geralyn Corwin DO Signed: 04/01/2021 3:14:58 PM By: Fonnie Mu RN Entered By: Fonnie Mu on 03/15/2021 16:32:39

## 2021-04-01 NOTE — Progress Notes (Signed)
[name], [name] L. ([id number]) Visit Report for 03/15/2021 Arrival Information Details Patient Name: Date of Service: [name], [name] 03/15/2021 3:30 PM Medical Record Number: [id number] Patient Account Number: [id number] Date of Birth/Sex: Treating RN: 10/03/1991 (29 y.o. [name]) Breedlove, [name] Primary Care Fatime Biswell: PCP, NO Other Clinician: Referring Tiare Rohlman: Treating Sujata Maines/Extender: Malachi ProHoffman, Jessica [name], [name] Weeks in Treatment: 32 Visit Information History Since Last Visit Added or deleted any medications: No Patient Arrived: Wheel Chair Any new allergies or adverse reactions: No Arrival Time: 15:49 Had [name] fall or experienced change in No Accompanied By: self activities of daily living that may affect Transfer Assistance: Manual risk of falls: Patient Identification Verified: Yes Signs or symptoms of abuse/neglect since last visito No Secondary Verification Process Completed: Yes Hospitalized since last visit: No Patient Requires Transmission-Based Precautions: No Implantable device outside of the clinic excluding No Patient Has Alerts: No cellular tissue based products placed in the center since last visit: Has Dressing in Place as Prescribed: Yes Pain Present Now: No Electronic Signature(s) Signed: 03/16/2021 10:33:47 AM By: [name] Itoawkins, Destiny Entered By: [name] Itoawkins, Destiny on 03/15/2021 15:52:16 -------------------------------------------------------------------------------- Encounter Discharge Information Details Patient Name: Date of Service: [name], [name] 03/15/2021 3:30 PM Medical Record Number: [id number] Patient Account Number: [id number] Date of Birth/Sex: Treating RN: 12/13/1990 (29 y.o. [name]) Lynch, Shatara Primary Care Darriel Utter: PCP, NO Other Clinician: Referring Autrey Human: Treating Jeanmarie Mccowen/Extender: Malachi ProHoffman, Jessica [name], [name] Weeks in Treatment: 32 Encounter Discharge Information Items Post Procedure Vitals Discharge Condition:  Stable Temperature (F): 97.4 Ambulatory Status: Wheelchair Pulse (bpm): 96 Discharge Destination: Home Respiratory Rate (breaths/min): 16 Transportation: Private Auto Blood Pressure (mmHg): 124/84 Accompanied By: alone Schedule Follow-up Appointment: Yes Clinical Summary of Care: Patient Declined Electronic Signature(s) Signed: 03/17/2021 5:27:18 PM By: [name] AbtsLynch, Shatara RN, BSN Entered By: [name] AbtsLynch, Shatara on 03/15/2021 17:21:09 -------------------------------------------------------------------------------- Lower Extremity Assessment Details Patient Name: Date of Service: [name], [name] 03/15/2021 3:30 PM Medical Record Number: [id number] Patient Account Number: [id number] Date of Birth/Sex: Treating RN: 07/20/1991 (29 y.o. [name]) Breedlove, [name] Primary Care Sheikh Leverich: PCP, NO Other Clinician: Referring Ahliya Glatt: Treating Yasmyn Bellisario/Extender: Lynne LoganHoffman, Jessica [name], [name] Weeks in Treatment: 32 Edema Assessment Assessed: [name][Left: Yes] Franne Forts[Right: No] Edema: [Left: Yes] [Right: No] Calf Left: Right: Point of Measurement: 47 cm From Medial Instep 27.5 cm Ankle Left: Right: Point of Measurement: 12 cm From Medial Instep 21 cm Vascular Assessment Pulses: Dorsalis Pedis Palpable: [Left:Yes] Posterior Tibial Palpable: [Left:Yes] Electronic Signature(s) Signed: 04/01/2021 3:14:58 PM By: Fonnie MuBreedlove, Lauren RN Entered By: Fonnie MuBreedlove, [name] on 03/15/2021 16:23:05 -------------------------------------------------------------------------------- Multi Wound Chart Details Patient Name: Date of Service: [name], [name] 03/15/2021 3:30 PM Medical Record Number: [id number] Patient Account Number: [id number] Date of Birth/Sex: Treating RN: 12/06/1990 (29 y.o. [name]) Breedlove, [name] Primary Care Anylah Scheib: PCP, NO Other Clinician: Referring Dalphine Cowie: Treating Caryl Fate/Extender: Malachi ProHoffman, Jessica [name], [name] Weeks in Treatment: 32 Vital Signs Height(in): 71 Pulse(bpm):  96 Weight(lbs): 136 Blood Pressure(mmHg): 124/84 Body Mass Index(BMI): 19 Temperature(F): 97.4 Respiratory Rate(breaths/min): 16 Photos: [3:Left, Lateral Malleolus] [4:No Photos Sacrum] [6:Right Ischium] Wound Location: [3:Gradually Appeared] [4:Gradually Appeared] [6:Trauma] Wounding Event: [3:Pressure Ulcer] [4:Pressure Ulcer] [6:Pressure Ulcer] Primary Etiology: [3:Paraplegia] [4:Paraplegia] [6:Paraplegia] Comorbid History: [3:06/27/2020] [4:05/27/2020] [6:02/10/2021] Date Acquired: [3:32] [4:32] [6:3] Weeks of Treatment: [3:Open] [4:Healed - Epithelialized] [6:Open] Wound Status: [3:0.4x1x0.3] [4:0x0x0] [6:5.9x5.5x1.7] Measurements L x W x D (cm) [3:0.314] [4:0] [6:25.486] [name] (cm) : rea [3:0.094] [4:0] [6:43.326] Volume (cm) : [3:91.30%] [4:100.00%] [6:-66.40%] % Reduction in [name] rea: [3:95.70%] [4:100.00%] [6:-2728.10%] % Reduction in Volume: [3:Category/Stage IV] [4:Category/Stage  IV] [6:Unstageable/Unclassified] Classification: [3:Medium] [4:Small] [6:Medium] Exudate [name] mount: [3:Serosanguineous] [4:Serosanguineous] [6:Serosanguineous] Exudate Type: [3:red, brown] [4:red, brown] [6:red, brown] Exudate Color: [3:Epibole] [4:Well defined, not attached] [6:Distinct, outline attached] Wound Margin: [3:Large (67-100%)] [4:Large (67-100%)] [6:Small (1-33%)] Granulation [name] mount: [3:Red] [4:Pink, Pale] [6:Pink] Granulation Quality: [3:Small (1-33%)] [4:None Present (0%)] [6:Large (67-100%)] Necrotic [name] mount: [3:Fat Layer (Subcutaneous Tissue): Yes Fascia: No] [6:Fat Layer (Subcutaneous Tissue): Yes] Exposed Structures: [3:Fascia: No Tendon: No Muscle: No Joint: No Bone: No Small (1-33%)] [4:Fat Layer (Subcutaneous Tissue): No Tendon: No Muscle: No Joint: No Bone: No Large (67-100%)] [6:Fascia: No Tendon: No Muscle: No Joint: No Bone: No None] Epithelialization: [3:N/[name]] [4:N/[name]] [6:Debridement - Excisional] Debridement: Pre-procedure Verification/Time Out N/[name] [4:N/[name]] [6:16:27] Taken:  [3:N/[name]] [4:N/[name]] [6:Lidocaine] Pain Control: [3:N/[name]] [4:N/[name]] [6:Subcutaneous, Slough] Tissue Debrided: [3:N/[name]] [4:N/[name]] [6:Skin/Subcutaneous Tissue] Level: [3:N/[name]] [4:N/[name]] [6:32.45] Debridement [name] (sq cm): [3:rea N/[name]] [4:N/[name]] [6:Forceps, Scissors] Instrument: [3:N/[name]] [4:N/[name]] [6:Minimum] Bleeding: [3:N/[name]] [4:N/[name]] [6:Pressure] Hemostasis [name] chieved: [3:N/[name]] [4:N/[name]] [6:0] Procedural Pain: [3:N/[name]] [4:N/[name]] [6:0] Post Procedural Pain: [3:N/[name]] [4:N/[name]] [6:Procedure was tolerated well] Debridement Treatment Response: [3:N/[name]] [4:N/[name]] [6:5.9x5.5x1.7] Post Debridement Measurements L x W x D (cm) [3:N/[name]] [4:N/[name]] [6:43.326] Post Debridement Volume: (cm) [3:N/[name]] [4:N/[name]] [6:Unstageable/Unclassified] Post Debridement Stage: [3:N/[name]] [4:N/[name]] [6:Debridement] Treatment Notes Wound #3 (Malleolus) Wound Laterality: Left, Lateral Cleanser Normal Saline Discharge Instruction: Cleanse the wound with Normal Saline prior to applying [name] clean dressing using gauze sponges, not tissue or cotton balls. Normal Saline Discharge Instruction: Cleanse the wound with Normal Saline prior to applying [name] clean dressing using gauze sponges, not tissue or cotton balls. Peri-Wound Care Topical Primary Dressing Promogran Prisma Matrix, 4.34 (sq in) (silver collagen) Discharge Instruction: Moisten collagen with saline or hydrogel Secondary Dressing ComfortFoam Border, 4x4 in (silicone border) Discharge Instruction: Apply over primary dressing as directed. Secured With Compression Wrap Compression Stockings Add-Ons Wound #4 (Sacrum) Cleanser Peri-Wound Care Topical Primary Dressing Secondary Dressing Secured With Compression Wrap Compression Stockings Add-Ons Wound #6 (Ischium) Wound Laterality: Right Cleanser Normal Saline Discharge Instruction: Cleanse the wound with Normal Saline prior to applying [name] clean dressing using gauze sponges, not tissue or cotton balls. Peri-Wound Care Topical Primary Dressing Santyl  Ointment Discharge Instruction: Apply nickel thick amount to wound bed as instructed Secondary Dressing Woven Gauze Sponge, Non-Sterile 4x4 in Discharge Instruction: moisten with saline Apply over santyl ABD Pad, 5x9 Discharge Instruction: Apply over primary dressing as directed. Secured With 46M Medipore H Soft Cloth Surgical T 4 x 2 (in/yd) ape Discharge Instruction: Secure dressing with tape as directed. Compression Wrap Compression Stockings Add-Ons Electronic Signature(s) Signed: 04/01/2021 2:12:29 PM By: Geralyn Corwin DO Signed: 04/01/2021 3:14:58 PM By: Fonnie Mu RN Entered By: Geralyn Corwin on 03/18/2021 17:24:50 -------------------------------------------------------------------------------- Multi-Disciplinary Care Plan Details Patient Name: Date of Service: [name], [name] NIO L. 03/15/2021 3:30 PM Medical Record Number: [id number] Patient Account Number: [id number] Date of Birth/Sex: Treating RN: [birth year] (29 y.o. [name] Primary Care Jamarien Rodkey: PCP, NO Other Clinician: Referring Ambrielle Kington: Treating Ken Bonn/Extender: Malachi Pro in Treatment: 32 Multidisciplinary Care Plan reviewed with physician Active Inactive Pressure Nursing Diagnoses: Knowledge deficit related to causes and risk factors for pressure ulcer development Goals: Patient/caregiver will verbalize risk factors for pressure ulcer development Date Initiated: 07/30/2020 Target Resolution Date: 04/02/2021 Goal Status: Active Interventions: Provide education on pressure ulcers Notes: Wound/Skin Impairment Nursing Diagnoses: Impaired tissue integrity Goals: Patient/caregiver will verbalize understanding of skin care regimen Date Initiated: 01/05/2021 Target Resolution Date: 04/02/2021 Goal Status: Active Ulcer/skin breakdown will have [name] volume reduction of  50% by week 8 Date Initiated: 07/30/2020 Date Inactivated: 10/20/2020 Target Resolution Date:  09/27/2020 Goal Status: Unmet Unmet Reason: Infection Ulcer/skin breakdown will have [name] volume reduction of 80% by week 12 Date Initiated: 10/20/2020 Date Inactivated: 12/22/2020 Target Resolution Date: 11/19/2020 Unmet Reason: paraplegic, difficulty Goal Status: Unmet offloading Interventions: Provide education on ulcer and skin care Notes: Electronic Signature(s) Signed: 04/01/2021 3:14:58 PM By: Fonnie Mu RN Entered By: Fonnie Mu on 03/15/2021 16:31:54 -------------------------------------------------------------------------------- Pain Assessment Details Patient Name: Date of Service: [name] March L. 03/15/2021 3:30 PM Medical Record Number: [id number] Patient Account Number: [id number] Date of Birth/Sex: Treating RN: November 09, 1991 (29 y.o. [name] Primary Care Kainan Patty: PCP, NO Other Clinician: Referring Amous Crewe: Treating Hayward Rylander/Extender: Malachi Pro in Treatment: 32 Active Problems Location of Pain Severity and Description of Pain Patient Has Paino No Site Locations Pain Management and Medication Current Pain Management: Electronic Signature(s) Signed: 03/16/2021 10:33:47 AM By: [name] Ito Signed: 04/01/2021 3:14:58 PM By: Fonnie Mu RN Entered By: [name] Ito on 03/15/2021 15:52:35 -------------------------------------------------------------------------------- Patient/Caregiver Education Details Patient Name: Date of Service: [name] Regulus 4/19/2022andnbsp3:30 PM Medical Record Number: [id number] Patient Account Number: [id number] Date of Birth/Gender: Treating RN: 1991-02-03 (29 y.o. [name] Primary Care Physician: PCP, NO Other Clinician: Referring Physician: Treating Physician/Extender: Malachi Pro in Treatment: 32 Education Assessment Education Provided To: Patient Education Topics Provided Pressure: Methods:  Explain/Verbal Responses: Reinforcements needed Wound/Skin Impairment: Methods: Explain/Verbal Responses: Reinforcements needed, State content correctly Electronic Signature(s) Signed: 04/01/2021 3:14:58 PM By: Fonnie Mu RN Entered By: Fonnie Mu on 03/15/2021 16:32:18 -------------------------------------------------------------------------------- Wound Assessment Details Patient Name: Date of Service: [name] March L. 03/15/2021 3:30 PM Medical Record Number: [id number] Patient Account Number: [id number] Date of Birth/Sex: Treating RN: 1991-09-02 (29 y.o. [name], [name] Primary Care Zebedee Segundo: PCP, NO Other Clinician: Referring Azhane Eckart: Treating Shalev Helminiak/Extender: Malachi Pro in Treatment: 32 Wound Status Wound Number: 3 Primary Etiology: Pressure Ulcer Wound Location: Left, Lateral Malleolus Wound Status: Open Wounding Event: Gradually Appeared Comorbid History: Paraplegia Date Acquired: 06/27/2020 Weeks Of Treatment: 32 Clustered Wound: No Photos Wound Measurements Length: (cm) 0.4 Width: (cm) 1 Depth: (cm) 0.3 Area: (cm) 0.314 Volume: (cm) 0.094 % Reduction in Area: 91.3% % Reduction in Volume: 95.7% Epithelialization: Small (1-33%) Tunneling: No Undermining: No Wound Description Classification: Category/Stage IV Wound Margin: Epibole Exudate Amount: Medium Exudate Type: Serosanguineous Exudate Color: red, brown Foul Odor After Cleansing: No Slough/Fibrino Yes Wound Bed Granulation Amount: Large (67-100%) Exposed Structure Granulation Quality: Red Fascia Exposed: No Necrotic Amount: Small (1-33%) Fat Layer (Subcutaneous Tissue) Exposed: Yes Necrotic Quality: Adherent Slough Tendon Exposed: No Muscle Exposed: No Joint Exposed: No Bone Exposed: No Electronic Signature(s) Signed: 03/16/2021 10:33:47 AM By: [name] Ito Signed: 04/01/2021 3:14:58 PM By: Fonnie Mu RN Entered By: [name] Ito  on 03/15/2021 16:54:39 -------------------------------------------------------------------------------- Wound Assessment Details Patient Name: Date of Service: [name] March L. 03/15/2021 3:30 PM Medical Record Number: [id number] Patient Account Number: [id number] Date of Birth/Sex: Treating RN: 12-18-1990 (29 y.o. [name] Primary Care Erynn Vaca: PCP, NO Other Clinician: Referring Louvinia Cumbo: Treating Nicolae Vasek/Extender: Lynne Logan Weeks in Treatment: 32 Wound Status Wound Number: 4 Primary Etiology: Pressure Ulcer Wound Location: Sacrum Wound Status: Healed - Epithelialized Wounding Event: Gradually Appeared Comorbid History: Paraplegia Date Acquired: 05/27/2020 Weeks Of Treatment: 32 Clustered Wound: No Wound Measurements Length: (cm) Width: (cm) Depth: (cm) Area: (cm) Volume: (cm) Wound Description Classification: Category/Stage IV Wound Margin: Well defined,  not attached Exudate Amount: Small Exudate Type: Serosanguineous Exudate Color: red, brown Foul Odor After Cleansing: Slough/Fibrino 0 % Reduction in Area: 100% 0 % Reduction in Volume: 100% 0 Epithelialization: Large (67-100%) 0 Tunneling: No 0 Undermining: No No No Wound Bed Granulation Amount: Large (67-100%) Exposed Structure Granulation Quality: Pink, Pale Fascia Exposed: No Necrotic Amount: None Present (0%) Fat Layer (Subcutaneous Tissue) Exposed: No Tendon Exposed: No Muscle Exposed: No Joint Exposed: No Bone Exposed: No Treatment Notes Wound #4 (Sacrum) Cleanser Peri-Wound Care Topical Primary Dressing Secondary Dressing Secured With Compression Wrap Compression Stockings Add-Ons Electronic Signature(s) Signed: 04/01/2021 3:14:58 PM By: Fonnie Mu RN Entered By: Fonnie Mu on 03/15/2021 16:27:00 -------------------------------------------------------------------------------- Wound Assessment Details Patient Name: Date of  Service: [name] March L. 03/15/2021 3:30 PM Medical Record Number: [id number] Patient Account Number: [id number] Date of Birth/Sex: Treating RN: [birth year] (29 y.o. [name], [name] Primary Care Jayli Fogleman: PCP, NO Other Clinician: Referring Neytiri Asche: Treating Shiven Junious/Extender: Malachi Pro in Treatment: 32 Wound Status Wound Number: 6 Primary Etiology: Pressure Ulcer Wound Location: Right Ischium Wound Status: Open Wounding Event: Trauma Comorbid History: Paraplegia Date Acquired: 02/10/2021 Weeks Of Treatment: 3 Clustered Wound: No Photos Wound Measurements Length: (cm) 5.9 Width: (cm) 5.5 Depth: (cm) 1.7 Area: (cm) 25.486 Volume: (cm) 43.326 % Reduction in Area: -66.4% % Reduction in Volume: -2728.1% Epithelialization: None Tunneling: No Undermining: No Wound Description Classification: Unstageable/Unclassified Wound Margin: Distinct, outline attached Exudate Amount: Medium Exudate Type: Serosanguineous Exudate Color: red, brown Foul Odor After Cleansing: No Slough/Fibrino Yes Wound Bed Granulation Amount: Small (1-33%) Exposed Structure Granulation Quality: Pink Fascia Exposed: No Necrotic Amount: Large (67-100%) Fat Layer (Subcutaneous Tissue) Exposed: Yes Necrotic Quality: Adherent Slough Tendon Exposed: No Muscle Exposed: No Joint Exposed: No Bone Exposed: No Electronic Signature(s) Signed: 03/16/2021 10:33:47 AM By: [name] Ito Signed: 04/01/2021 3:14:58 PM By: Fonnie Mu RN Entered By: [name] Ito on 03/15/2021 16:54:59 -------------------------------------------------------------------------------- Vitals Details Patient Name: Date of Service: [name] March L. 03/15/2021 3:30 PM Medical Record Number: [id number] Patient Account Number: [id number] Date of Birth/Sex: Treating RN: 20-May-1991 (29 y.o. [name], [name] Primary Care Delonta Yohannes: PCP, NO Other Clinician: Referring  Lakeva Hollon: Treating Joniyah Mallinger/Extender: Malachi Pro in Treatment: 32 Vital Signs Time Taken: 15:52 Temperature (F): 97.4 Height (in): 71 Pulse (bpm): 96 Weight (lbs): 136 Respiratory Rate (breaths/min): 16 Body Mass Index (BMI): 19 Blood Pressure (mmHg): 124/84 Reference Range: 80 - 120 mg / dl Electronic Signature(s) Signed: 03/16/2021 10:33:47 AM By: [name] Ito Entered By: [name] Ito on 03/15/2021 15:52:30

## 2021-04-04 LAB — AEROBIC CULTURE W GRAM STAIN (SUPERFICIAL SPECIMEN)

## 2021-04-06 ENCOUNTER — Encounter (HOSPITAL_BASED_OUTPATIENT_CLINIC_OR_DEPARTMENT_OTHER): Payer: Medicaid Other | Admitting: Physician Assistant

## 2021-04-06 ENCOUNTER — Other Ambulatory Visit: Payer: Self-pay

## 2021-04-06 DIAGNOSIS — L89154 Pressure ulcer of sacral region, stage 4: Secondary | ICD-10-CM | POA: Diagnosis not present

## 2021-04-06 NOTE — Progress Notes (Addendum)
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 vomiting, or diarrhea. 02/16/2021 upon evaluation today patient appears to be doing decently well in regard to the sacral wound as well as his ankle wound. Both are showing signs of significant improvement which is great news and I am pleased in that regard. There does not appear to be any evidence of infection which is also excellent news. Unfortunately he has a right ischial ulcer which is new and unfortunately I think this is also unstageable which means we are unsure how deep this is really the end up being. Obviously I think this is a big deal. 4/19; patient presents for evaluation of his right ischial ulcer and right ankle wound. He has been using Santyl to the right ischial ulcer and collagen to the ankle wound. He reports no issues today. 03/30/2021 upon evaluation today patient appears to be doing worse in regard to his wound in the right ischial location.  Fortunately there does not appear to be any signs of active infection at this time which is great news systemically though locally I feel like this likely is infected. I am can obtain a culture today to see what  shows so we can adjust and treat him appropriately. With that being said the ankle appears to be doing okay and there was a gluteal region on the right that was in question but I do not see anything that is actually open here. 04/06/2021 upon evaluation today patient appears to be doing poorly in regard to his wound. He is unfortunately showing signs of significant infection in my opinion. This is the right ischial location. He does actually have necrotic bone noted in the base of the wound unfortunately. This also has a significant odor at this point. I think that coupled with the fevers been having as high as 102 although he is 100.3 right now he feels hot to touch all over. I do believe that this is likely osteomyelitis and I believe that he really needs to be treated aggressively at this point I would recommend that he needs to go to the ER for possible and likely hospital admission. I think IV antibiotics are going to be a necessary at this time. Objective Constitutional Well-nourished and well-hydrated in no acute distress. Vitals Time Taken: 2:25 PM, Height: 71 in, Weight: 136 lbs, BMI: 19, Temperature: 100.3 F, Pulse: 123 bpm, Respiratory Rate: 18 breaths/min, Blood Pressure: 149/92 mmHg. Respiratory normal breathing without difficulty. Psychiatric this patient is able to make decisions and demonstrates good insight into disease process. Alert and Oriented x 3. pleasant and cooperative. General Notes: Upon inspection patient's wound that showed signs of being much worse even the last time I saw him this is necrotic in the base of the wound even showed some signs of necrotic bone starting to breakdown in the base of the wound. Coupled with the significant malodorous drainage I am concerned about a significant infection. I am not even certain he is not developing into sepsis at this point. He does have a fever 100.3 here today. He tells me has been as high as 102 at home. He states "that is  because of my wound" Integumentary (Hair, Skin) Wound #3 status is Open. Original cause of wound was Gradually Appeared. The date acquired was: 06/27/2020. The wound has been in treatment 35 weeks. The wound is located on the Left,Lateral Malleolus. The wound measures 0.3cm length x 0.3cm width x 0.2cm depth; 0.071cm^2 area and 0.014cm^3 volume. There is Fat Layer (Subcutaneous Tissue) exposed. There is no tunneling or undermining noted. There is a medium amount of serosanguineous drainage noted. The wound margin is epibole. There is large (67-100%) red granulation within the wound bed. There is no necrotic tissue within the wound bed. Wound #6 status is Open. Original cause of wound was Trauma. The date acquired was: 02/10/2021. The wound has been in treatment 7 weeks. The wound is located on the Right Ischium. The wound measures 7.5cm length x 5.3cm width x 3.4cm depth; 31.22cm^2 area and 106.147cm^3 volume. There is bone, tendon, and Fat Layer (Subcutaneous Tissue) exposed. There is no tunneling noted, however, there is undermining starting at 12:00 and ending at 12:00 with a maximum distance of 5.5cm. There is a large amount of purulent drainage noted. Foul odor after cleansing was noted. Foul odor after cleansing was noted due to product use. The wound margin is distinct with the outline attached to the wound base. There is  medium (34-66%) pink granulation within the wound bed. There is a medium (34-66%) amount of necrotic tissue within the wound bed including Eschar and Adherent Slough. Wound #7 status is Open. Original cause of wound was Pressure Injury. The date acquired was: 04/06/2021. The wound is located on the Sacrum. The wound measures 0.3cm length x 1.1cm width x 0.1cm depth; 0.259cm^2 area and 0.026cm^3 volume. There is Fat Layer (Subcutaneous Tissue) exposed. There is no tunneling or undermining noted. There is a medium amount of serosanguineous drainage noted. The wound margin is distinct  with the outline attached to the wound base. There is large (67-100%) red, pink, pale granulation within the wound bed. There is no necrotic tissue within the wound bed. Assessment Active Problems ICD-10 Pressure ulcer of sacral region, stage 4 Pressure ulcer of left ankle, stage 3 Pressure ulcer of right buttock, unstageable Unspecified severe protein-calorie malnutrition Paraplegia, complete Other chronic osteomyelitis, other site Plan Follow-up Appointments: Return Appointment in 1 week. - or call to schedule once discharged from the hospital Other: - Go to emergency room for evaluation and treatment of right ischial wound infection Bathing/ Shower/ Hygiene: May shower with protection but do not get wound dressing(s) wet. Off-Loading: Turn and reposition every 2 hours - be sure to lift up off the chair with arms every hour while in wheelchair Other: - pillows under left calf to keep pressure of left lateral ankle Non Wound Condition: Protect area with: - Protect sacrum with vaseline or zinc oxide WOUND #3: - Malleolus Wound Laterality: Left, Lateral Cleanser: Normal Saline Every Other Day/30 Days Discharge Instructions: Cleanse the wound with Normal Saline prior to applying a clean dressing using gauze sponges, not tissue or cotton balls. Prim Dressing: Promogran Prisma Matrix, 4.34 (sq in) (silver collagen) Every Other Day/30 Days ary Discharge Instructions: Moisten collagen with saline or hydrogel Secondary Dressing: ComfortFoam Border, 4x4 in (silicone border) Every Other Day/30 Days Discharge Instructions: Apply over primary dressing as directed. WOUND #6: - Ischium Wound Laterality: Right Cleanser: Normal Saline 1 x Per Day/30 Days Discharge Instructions: Cleanse the wound with Normal Saline prior to applying a clean dressing using gauze sponges, not tissue or cotton balls. Prim Dressing: Dakin's Solution 0.25%, 16 (oz) 1 x Per Day/30 Days ary Discharge Instructions:  Moisten gauze with Dakin's solution and pack lightly into wound Secondary Dressing: ABD Pad, 5x9 1 x Per Day/30 Days Discharge Instructions: Apply over primary dressing as directed. Secured With: 38M Medipore H Soft Cloth Surgical T 4 x 2 (in/yd) 1 x Per Day/30 Days ape Discharge Instructions: Secure dressing with tape as directed. WOUND #7: - Sacrum Wound Laterality: Prim Dressing: Maxorb Extra Calcium Alginate 2x2 in 1 x Per Day/ ary Discharge Instructions: Apply calcium alginate to wound bed as instructed Secondary Dressing: Zetuvit Plus 4x4 in 1 x Per Day/ Discharge Instructions: Apply over primary dressing as directed. 1. Would recommend currently the patient should go to the ER for further evaluation and treatment. I think that this is something they should do sooner rather than later in my opinion. I believe he is going to likely require IV antibiotic therapy and likely hospital admission to get this infection under control. 2. I am also can recommend that he should really go to the ER today he [facility] me [facility] and he will do that later this afternoon. For that reason I Ernie HewMinna send this note with him so that they will know what is going on and why  we are sending him. 3. Obviously still needs to continue with appropriate offloading obviously this can be done in the hospital as well as at home. We will see the patient back for follow-up visit after the hospital discharge. Electronic Signature(s) Signed: 04/06/2021 3:25:06 PM By: Lenda Kelp PA-C Entered By: Lenda Kelp on 04/06/2021 15:25:06 -------------------------------------------------------------------------------- SuperBill Details Patient Name: Date of Service: [name] 04/06/2021 Medical Record Number: [id number] Patient Account Number: [id number] Date of Birth/Sex: Treating RN: Oct 20, 1991 (29 y.o. [name] Primary Care Provider: PCP, NO Other Clinician: Referring  Provider: Treating Provider/Extender: [name] in Treatment: 35 Diagnosis Coding ICD-10 Codes Code Description L89.154 Pressure ulcer of sacral region, stage 4 L89.523 Pressure ulcer of left ankle, stage 3 L89.310 Pressure ulcer of right buttock, unstageable E43 Unspecified severe protein-calorie malnutrition G82.21 Paraplegia, complete M86.68 Other chronic osteomyelitis, other site Facility Procedures CPT4 Code: [id number] Description: 99214 - WOUND CARE VISIT-LEV 4 EST PT Modifier: Quantity: 1 Physician Procedures : CPT4 Code Description Modifier [id number] 99214 - WC PHYS LEVEL 4 - EST PT ICD-10 Diagnosis Description L89.154 Pressure ulcer of sacral region, stage 4 L89.523 Pressure ulcer of left ankle, stage 3 L89.310 Pressure ulcer of right buttock, unstageable E43  Unspecified severe protein-calorie malnutrition Quantity: 1 Electronic Signature(s) Signed: 04/06/2021 3:25:17 PM By: Lenda Kelp PA-C Entered By: Lenda Kelp on 04/06/2021 15:25:16

## 2021-04-06 NOTE — Progress Notes (Signed)
[name], [name] ([id number]) Visit Report for 04/06/2021 Arrival Information Details Patient Name: Date of Service: [name], [name] 04/06/2021 2:00 PM Medical Record Number: [id number] Patient Account Number: [id number] Date of Birth/Sex: Treating RN: [birth year] (30 y.o. [name], [name] Primary Care Safiatou Islam: PCP, NO Other Clinician: Referring Kynadee Dam: Treating Mareesa Gathright/Extender: [name] in Treatment: 35 Visit Information History Since Last Visit Added or deleted any medications: No Patient Arrived: Wheel Chair Any new allergies or adverse reactions: No Arrival Time: 14:25 Had a fall or experienced change in No Accompanied By: self activities of daily living that may affect Transfer Assistance: Manual risk of falls: Patient Identification Verified: Yes Signs or symptoms of abuse/neglect since last visito No Secondary Verification Process Completed: Yes Hospitalized since last visit: No Patient Requires Transmission-Based Precautions: No Implantable device outside of the clinic excluding No Patient Has Alerts: No cellular tissue based products placed in the center since last visit: Has Dressing in Place as Prescribed: Yes Pain Present Now: No Electronic Signature(s) Signed: 04/06/2021 5:30:57 PM By: Shawn Stall Entered By: Shawn Stall on 04/06/2021 14:28:19 -------------------------------------------------------------------------------- Clinic Level of Care Assessment Details Patient Name: Date of Service: [name], [name] 04/06/2021 2:00 PM Medical Record Number: [id number] Patient Account Number: [id number] Date of Birth/Sex: Treating RN: 08-02-1991 (30 y.o. [name] Primary Care Davan Nawabi: PCP, NO Other Clinician: Referring Yanet Balliet: Treating Shawntell Dixson/Extender: [name] in Treatment: 35 Clinic Level of Care Assessment Items TOOL 4 Quantity Score []  - 0 Use when only an EandM is  performed on FOLLOW-UP visit ASSESSMENTS - Nursing Assessment / Reassessment X- 1 10 Reassessment of Co-morbidities (includes updates in patient status) X- 1 5 Reassessment of Adherence to Treatment Plan ASSESSMENTS - Wound and Skin A ssessment / Reassessment []  - 0 Simple Wound Assessment / Reassessment - one wound X- 3 5 Complex Wound Assessment / Reassessment - multiple wounds []  - 0 Dermatologic / Skin Assessment (not related to wound area) ASSESSMENTS - Focused Assessment []  - 0 Circumferential Edema Measurements - multi extremities []  - 0 Nutritional Assessment / Counseling / Intervention []  - 0 Lower Extremity Assessment (monofilament, tuning fork, pulses) []  - 0 Peripheral Arterial Disease Assessment (using hand held doppler) ASSESSMENTS - Ostomy and/or Continence Assessment and Care []  - 0 Incontinence Assessment and Management []  - 0 Ostomy Care Assessment and Management (repouching, etc.) PROCESS - Coordination of Care X - Simple Patient / Family Education for ongoing care 1 15 []  - 0 Complex (extensive) Patient / Family Education for ongoing care X- 1 10 Staff obtains , Records, T Results / Process Orders est []  - 0 Staff telephones HHA, Nursing Homes / Clarify orders / etc []  - 0 Routine Transfer to another Facility (non-emergent condition) X- 1 10 Routine Hospital Admission (non-emergent condition) []  - 0 New Admissions / / Ordering NPWT Apligraf, etc. , []  - 0 Emergency Hospital Admission (emergent condition) X- 1 10 Simple Discharge Coordination []  - 0 Complex (extensive) Discharge Coordination PROCESS - Special Needs []  - 0 Pediatric / Minor Patient Management []  - 0 Isolation Patient Management []  - 0 Hearing / Language / Visual special needs []  - 0 Assessment of Community assistance (transportation, D/C planning, etc.) []  - 0 Additional assistance / Altered mentation []  - 0 Support Surface(s) Assessment  (bed, cushion, seat, etc.) INTERVENTIONS - Wound Cleansing / Measurement []  - 0 Simple Wound Cleansing - one wound X- 3 5 Complex Wound Cleansing - multiple  wounds X- 1 5 Wound Imaging (photographs - any number of wounds) []  - 0 Wound Tracing (instead of photographs) []  - 0 Simple Wound Measurement - one wound X- 3 5 Complex Wound Measurement - multiple wounds INTERVENTIONS - Wound Dressings X - Small Wound Dressing one or multiple wounds 2 10 X- 1 15 Medium Wound Dressing one or multiple wounds []  - 0 Large Wound Dressing one or multiple wounds X- 1 5 Application of Medications - topical []  - 0 Application of Medications - injection INTERVENTIONS - Miscellaneous []  - 0 External ear exam []  - 0 Specimen Collection (cultures, biopsies, blood, body fluids, etc.) []  - 0 Specimen(s) / Culture(s) sent or taken to Lab for analysis []  - 0 Patient Transfer (multiple staff / Nurse, adultHoyer Lift / Similar devices) []  - 0 Simple Staple / Suture removal (25 or less) []  - 0 Complex Staple / Suture removal (26 or more) []  - 0 Hypo / Hyperglycemic Management (close monitor of Blood Glucose) []  - 0 Ankle / Brachial Index (ABI) - do not check if billed separately X- 1 5 Vital Signs Has the patient been seen at the hospital within the last three years: Yes Total Score: 155 Level Of Care: New/Established - Level 4 Electronic Signature(s) Signed: 04/06/2021 6:13:52 PM By: Zenaida DeedBoehlein, Linda RN, BSN Entered By: Zenaida DeedBoehlein, Linda on 04/06/2021 15:16:12 -------------------------------------------------------------------------------- Lower Extremity Assessment Details Patient Name: Date of Service: [name] CrockerRUSSELL, [name] 04/06/2021 2:00 PM Medical Record Number: [id number] Patient Account Number: [id number] Date of Birth/Sex: Treating RN: 02/03/1991 (30 y.o. Tammy SoursM) Deaton, Bobbi Primary Care Julio Zappia: PCP, NO Other Clinician: Referring Marguerite Barba: Treating [name]/Extender: Angus PalmsStone III, Hoyt [name],  [name] Weeks in Treatment: 35 Edema Assessment Assessed: [Left: Yes] [Right: No] Edema: [Left: Yes] [Right: No] Calf Left: Right: Point of Measurement: 47 cm From Medial Instep 27 cm Ankle Left: Right: Point of Measurement: 12 cm From Medial Instep 23 cm Vascular Assessment Pulses: Dorsalis Pedis Palpable: [Left:Yes] Electronic Signature(s) Signed: 04/06/2021 5:30:57 PM By: Shawn Stalleaton, Bobbi Entered By: Shawn Stalleaton, Bobbi on 04/06/2021 14:32:18 -------------------------------------------------------------------------------- Multi-Disciplinary Care Plan Details Patient Name: Date of Service: [name] CrockerRUSSELL, [name] 04/06/2021 2:00 PM Medical Record Number: [id number] Patient Account Number: [id number] Date of Birth/Sex: Treating RN: 12/16/1990 (29 y.o. [name] SchoonerM) Boehlein, Linda Primary Care Issam Carlyon: PCP, NO Other Clinician: Referring Lillianah Swartzentruber: Treating Latoi Giraldo/Extender: Albertine GratesStone III, Hoyt [name], [name] Weeks in Treatment: 35 Multidisciplinary Care Plan reviewed with physician Active Inactive Pressure Nursing Diagnoses: Knowledge deficit related to causes and risk factors for pressure ulcer development Goals: Patient/caregiver will verbalize risk factors for pressure ulcer development Date Initiated: 07/30/2020 Target Resolution Date: 04/27/2021 Goal Status: Active Interventions: Provide education on pressure ulcers Notes: Wound/Skin Impairment Nursing Diagnoses: Impaired tissue integrity Goals: Patient/caregiver will verbalize understanding of skin care regimen Date Initiated: 01/05/2021 Target Resolution Date: 04/27/2021 Goal Status: Active Ulcer/skin breakdown will have a volume reduction of 50% by week 8 Date Initiated: 07/30/2020 Date Inactivated: 10/20/2020 Target Resolution Date: 09/27/2020 Goal Status: Unmet Unmet Reason: Infection Ulcer/skin breakdown will have a volume reduction of 80% by week 12 Date Initiated: 10/20/2020 Date Inactivated: 12/22/2020 Target Resolution Date:  11/19/2020 Unmet Reason: paraplegic, difficulty Goal Status: Unmet offloading Interventions: Provide education on ulcer and skin care Notes: Electronic Signature(s) Signed: 04/06/2021 6:13:52 PM By: Zenaida DeedBoehlein, Linda RN, BSN Entered By: Zenaida DeedBoehlein, Linda on 04/06/2021 15:12:35 -------------------------------------------------------------------------------- Pain Assessment Details Patient Name: Date of Service: Maurice MarchUSSELL, [name] 04/06/2021 2:00 PM Medical Record Number: [id number] Patient Account Number: [id number] Date of Birth/Sex: Treating RN: 01/20/1991 (  30 y.o. [name] Primary Care Lilas Diefendorf: PCP, NO Other Clinician: Referring Wileen Duncanson: Treating Izac Faulkenberry/Extender: [name] in Treatment: 35 Active Problems Location of Pain Severity and Description of Pain Patient Has Paino No Site Locations Rate the pain. Rate the pain. Current Pain Level: 0 Pain Management and Medication Current Pain Management: Medication: No Cold Application: No Rest: No Massage: No Activity: No T.E.N.S.: No Heat Application: No Leg drop or elevation: No Is the Current Pain Management Adequate: Adequate How does your wound impact your activities of daily livingo Sleep: No Bathing: No Appetite: No Relationship With Others: No Bladder Continence: No Emotions: No Bowel Continence: No Work: No Toileting: No Drive: No Dressing: No Hobbies: No Electronic Signature(s) Signed: 04/06/2021 5:30:57 PM By: Shawn Stall Entered By: Shawn Stall on 04/06/2021 14:28:52 -------------------------------------------------------------------------------- Patient/Caregiver Education Details Patient Name: Date of Service: [name] 5/11/2022andnbsp2:00 PM Medical Record Number: [id number] Patient Account Number: [id number] Date of Birth/Gender: Treating RN: 1991-05-06 (29 y.o. [name] Primary Care Physician: PCP, NO Other Clinician: Referring  Physician: Treating Physician/Extender: [name] in Treatment: 35 Education Assessment Education Provided To: Patient Education Topics Provided Infection: Methods: Explain/Verbal Responses: Reinforcements needed, State content correctly Pressure: Methods: Explain/Verbal Responses: Reinforcements needed, State content correctly Wound/Skin Impairment: Methods: Explain/Verbal Responses: Reinforcements needed, State content correctly Electronic Signature(s) Signed: 04/06/2021 6:13:52 PM By: Zenaida Deed RN, BSN Entered By: Zenaida Deed on 04/06/2021 15:14:01 -------------------------------------------------------------------------------- Wound Assessment Details Patient Name: Date of Service: [name] 04/06/2021 2:00 PM Medical Record Number: [id number] Patient Account Number: [id number] Date of Birth/Sex: Treating RN: September 20, 1991 (29 y.o. [name] Primary Care Owais Pruett: PCP, NO Other Clinician: Referring Magdelena Kinsella: Treating Glendon Dunwoody/Extender: Angus Palms Weeks in Treatment: 35 Wound Status Wound Number: 3 Primary Etiology: Pressure Ulcer Wound Location: Left, Lateral Malleolus Wound Status: Open Wounding Event: Gradually Appeared Comorbid History: Paraplegia Date Acquired: 06/27/2020 Weeks Of Treatment: 35 Clustered Wound: No Photos Wound Measurements Length: (cm) 0.3 Width: (cm) 0.3 Depth: (cm) 0.2 Area: (cm) 0.071 Volume: (cm) 0.014 % Reduction in Area: 98% % Reduction in Volume: 99.4% Epithelialization: Large (67-100%) Tunneling: No Undermining: No Wound Description Classification: Category/Stage IV Wound Margin: Epibole Exudate Amount: Medium Exudate Type: Serosanguineous Exudate Color: red, brown Foul Odor After Cleansing: No Slough/Fibrino No Wound Bed Granulation Amount: Large (67-100%) Exposed Structure Granulation Quality: Red Fascia Exposed: No Necrotic Amount: None Present  (0%) Fat Layer (Subcutaneous Tissue) Exposed: Yes Tendon Exposed: No Muscle Exposed: No Joint Exposed: No Bone Exposed: No Electronic Signature(s) Signed: 04/06/2021 4:50:41 PM By: [name] Ito Signed: 04/06/2021 5:30:57 PM By: Shawn Stall Entered By: [name] Ito on 04/06/2021 16:48:28 -------------------------------------------------------------------------------- Wound Assessment Details Patient Name: Date of Service: [name] 04/06/2021 2:00 PM Medical Record Number: [id number] Patient Account Number: [id number] Date of Birth/Sex: Treating RN: 1991/04/14 (29 y.o. [name] Primary Care Uriyah Massimo: PCP, NO Other Clinician: Referring Rabab Currington: Treating Nicola Quesnell/Extender: Angus Palms Weeks in Treatment: 35 Wound Status Wound Number: 6 Primary Etiology: Pressure Ulcer Wound Location: Right Ischium Wound Status: Open Wounding Event: Trauma Comorbid History: Paraplegia Date Acquired: 02/10/2021 Weeks Of Treatment: 7 Clustered Wound: No Photos Wound Measurements Length: (cm) 7.5 Width: (cm) 5.3 Depth: (cm) 3.4 Area: (cm) 31.22 Volume: (cm) 106.147 % Reduction in Area: -103.9% % Reduction in Volume: -6828.7% Epithelialization: None Tunneling: No Undermining: Yes Starting Position (o'clock): 12 Ending Position (o'clock): 12 Maximum Distance: (cm) 5.5 Wound Description Classification: Category/Stage  IV Wound Margin: Distinct, outline attached Exudate Amount: Large Exudate Type: Purulent Exudate Color: yellow, brown, green Foul Odor After Cleansing: Yes Due to Product Use: Yes Slough/Fibrino Yes Wound Bed Granulation Amount: Medium (34-66%) Exposed Structure Granulation Quality: Pink Fascia Exposed: No Necrotic Amount: Medium (34-66%) Fat Layer (Subcutaneous Tissue) Exposed: Yes Necrotic Quality: Eschar, Adherent Slough Tendon Exposed: Yes Muscle Exposed: No Joint Exposed: No Bone Exposed: Yes Electronic  Signature(s) Signed: 04/06/2021 4:50:41 PM By: [name] Ito Signed: 04/06/2021 5:30:57 PM By: Shawn Stall Entered By: [name] Ito on 04/06/2021 16:48:47 -------------------------------------------------------------------------------- Wound Assessment Details Patient Name: Date of Service: [name] 04/06/2021 2:00 PM Medical Record Number: [id number] Patient Account Number: [id number] Date of Birth/Sex: Treating RN: Dec 18, 1990 (29 y.o. [name] Primary Care Lezlee Gills: PCP, NO Other Clinician: Referring Catrena Vari: Treating Tanya Crothers/Extender: Angus Palms Weeks in Treatment: 35 Wound Status Wound Number: 7 Primary Etiology: Pressure Ulcer Wound Location: Sacrum Wound Status: Open Wounding Event: Pressure Injury Comorbid History: Paraplegia Date Acquired: 04/06/2021 Weeks Of Treatment: 0 Clustered Wound: No Photos Wound Measurements Length: (cm) 0.3 Width: (cm) 1.1 Depth: (cm) 0.1 Area: (cm) 0.259 Volume: (cm) 0.026 % Reduction in Area: 0% % Reduction in Volume: 0% Epithelialization: Small (1-33%) Tunneling: No Undermining: No Wound Description Classification: Category/Stage II Wound Margin: Distinct, outline attached Exudate Amount: Medium Exudate Type: Serosanguineous Exudate Color: red, brown Foul Odor After Cleansing: No Slough/Fibrino No Wound Bed Granulation Amount: Large (67-100%) Exposed Structure Granulation Quality: Red, Pink, Pale Fascia Exposed: No Necrotic Amount: None Present (0%) Fat Layer (Subcutaneous Tissue) Exposed: Yes Tendon Exposed: No Muscle Exposed: No Joint Exposed: No Bone Exposed: No Electronic Signature(s) Signed: 04/06/2021 4:50:41 PM By: [name] Ito Signed: 04/06/2021 5:30:57 PM By: Shawn Stall Entered By: [name] Ito on 04/06/2021 16:49:13 -------------------------------------------------------------------------------- Vitals Details Patient Name: Date of  Service: [name] 04/06/2021 2:00 PM Medical Record Number: [id number] Patient Account Number: [id number] Date of Birth/Sex: Treating RN: 1991-05-12 (29 y.o. [name] Primary Care Kaleel Schmieder: PCP, NO Other Clinician: Referring Emma Schupp: Treating Stephnie Parlier/Extender: [name] in Treatment: 35 Vital Signs Time Taken: 14:25 Temperature (F): 100.3 Height (in): 71 Pulse (bpm): 123 Weight (lbs): 136 Respiratory Rate (breaths/min): 18 Body Mass Index (BMI): 19 Blood Pressure (mmHg): 149/92 Reference Range: 80 - 120 mg / dl Electronic Signature(s) Signed: 04/06/2021 5:30:57 PM By: Shawn Stall Entered By: Shawn Stall on 04/06/2021 14:28:44

## 2021-04-08 ENCOUNTER — Inpatient Hospital Stay (HOSPITAL_COMMUNITY)
Admission: EM | Admit: 2021-04-08 | Discharge: 2021-04-10 | DRG: 593 | Discharge status: Against Medical Advice | Payer: Medicaid Other | Source: Ambulatory Visit | Attending: Internal Medicine | Admitting: Internal Medicine

## 2021-04-08 ENCOUNTER — Other Ambulatory Visit: Payer: Self-pay

## 2021-04-08 ENCOUNTER — Emergency Department (HOSPITAL_COMMUNITY): Payer: Medicaid Other

## 2021-04-08 ENCOUNTER — Encounter (HOSPITAL_COMMUNITY): Payer: Self-pay

## 2021-04-08 DIAGNOSIS — Z7901 Long term (current) use of anticoagulants: Secondary | ICD-10-CM

## 2021-04-08 DIAGNOSIS — M869 Osteomyelitis, unspecified: Secondary | ICD-10-CM | POA: Diagnosis present

## 2021-04-08 DIAGNOSIS — L89314 Pressure ulcer of right buttock, stage 4: Secondary | ICD-10-CM | POA: Diagnosis present

## 2021-04-08 DIAGNOSIS — L89154 Pressure ulcer of sacral region, stage 4: Principal | ICD-10-CM

## 2021-04-08 DIAGNOSIS — Z7401 Bed confinement status: Secondary | ICD-10-CM

## 2021-04-08 DIAGNOSIS — M86151 Other acute osteomyelitis, right femur: Secondary | ICD-10-CM | POA: Diagnosis present

## 2021-04-08 DIAGNOSIS — D72828 Other elevated white blood cell count: Secondary | ICD-10-CM | POA: Diagnosis present

## 2021-04-08 DIAGNOSIS — F17210 Nicotine dependence, cigarettes, uncomplicated: Secondary | ICD-10-CM | POA: Diagnosis present

## 2021-04-08 DIAGNOSIS — G894 Chronic pain syndrome: Secondary | ICD-10-CM | POA: Diagnosis present

## 2021-04-08 DIAGNOSIS — D638 Anemia in other chronic diseases classified elsewhere: Secondary | ICD-10-CM | POA: Diagnosis present

## 2021-04-08 DIAGNOSIS — Z86718 Personal history of other venous thrombosis and embolism: Secondary | ICD-10-CM

## 2021-04-08 DIAGNOSIS — M8618 Other acute osteomyelitis, other site: Secondary | ICD-10-CM | POA: Diagnosis present

## 2021-04-08 DIAGNOSIS — L89520 Pressure ulcer of left ankle, unstageable: Secondary | ICD-10-CM | POA: Diagnosis present

## 2021-04-08 DIAGNOSIS — L8990 Pressure ulcer of unspecified site, unspecified stage: Secondary | ICD-10-CM | POA: Diagnosis present

## 2021-04-08 DIAGNOSIS — Z20822 Contact with and (suspected) exposure to covid-19: Secondary | ICD-10-CM | POA: Diagnosis present

## 2021-04-08 DIAGNOSIS — D75838 Other thrombocytosis: Secondary | ICD-10-CM | POA: Diagnosis present

## 2021-04-08 DIAGNOSIS — G8220 Paraplegia, unspecified: Secondary | ICD-10-CM | POA: Diagnosis present

## 2021-04-08 DIAGNOSIS — Z9114 Patient's other noncompliance with medication regimen: Secondary | ICD-10-CM

## 2021-04-08 LAB — CBC WITH DIFFERENTIAL/PLATELET
Abs Immature Granulocytes: 0.13 10*3/uL — ABNORMAL HIGH (ref 0.00–0.07)
Basophils Absolute: 0.1 10*3/uL (ref 0.0–0.1)
Basophils Relative: 0 %
Eosinophils Absolute: 0.1 10*3/uL (ref 0.0–0.5)
Eosinophils Relative: 0 %
HCT: 32.5 % — ABNORMAL LOW (ref 39.0–52.0)
Hemoglobin: 10 g/dL — ABNORMAL LOW (ref 13.0–17.0)
Immature Granulocytes: 1 %
Lymphocytes Relative: 11 %
Lymphs Abs: 2 10*3/uL (ref 0.7–4.0)
MCH: 22.5 pg — ABNORMAL LOW (ref 26.0–34.0)
MCHC: 30.8 g/dL (ref 30.0–36.0)
MCV: 73 fL — ABNORMAL LOW (ref 80.0–100.0)
Monocytes Absolute: 1 10*3/uL (ref 0.1–1.0)
Monocytes Relative: 6 %
Neutro Abs: 15.3 10*3/uL — ABNORMAL HIGH (ref 1.7–7.7)
Neutrophils Relative %: 82 %
Platelets: 806 10*3/uL — ABNORMAL HIGH (ref 150–400)
RBC: 4.45 MIL/uL (ref 4.22–5.81)
RDW: 14.2 % (ref 11.5–15.5)
WBC: 18.6 10*3/uL — ABNORMAL HIGH (ref 4.0–10.5)
nRBC: 0 % (ref 0.0–0.2)

## 2021-04-08 LAB — COMPREHENSIVE METABOLIC PANEL
ALT: 11 U/L (ref 0–44)
AST: 11 U/L — ABNORMAL LOW (ref 15–41)
Albumin: 2.4 g/dL — ABNORMAL LOW (ref 3.5–5.0)
Alkaline Phosphatase: 83 U/L (ref 38–126)
Anion gap: 9 (ref 5–15)
BUN: <5 mg/dL — ABNORMAL LOW (ref 6–20)
CO2: 27 mmol/L (ref 22–32)
Calcium: 8.6 mg/dL — ABNORMAL LOW (ref 8.9–10.3)
Chloride: 97 mmol/L — ABNORMAL LOW (ref 98–111)
Creatinine, Ser: 0.69 mg/dL (ref 0.61–1.24)
GFR, Estimated: >60 mL/min (ref >60)
Glucose, Bld: 91 mg/dL (ref 70–99)
Potassium: 4.2 mmol/L (ref 3.5–5.1)
Sodium: 133 mmol/L — ABNORMAL LOW (ref 135–145)
Total Bilirubin: 0.6 mg/dL (ref 0.3–1.2)
Total Protein: 7.9 g/dL (ref 6.5–8.1)

## 2021-04-08 LAB — URINALYSIS, ROUTINE W REFLEX MICROSCOPIC
Bilirubin Urine: NEGATIVE
Glucose, UA: NEGATIVE mg/dL
Hgb urine dipstick: NEGATIVE
Ketones, ur: NEGATIVE mg/dL
Nitrite: NEGATIVE
Protein, ur: 30 mg/dL — AB
Specific Gravity, Urine: 1.015 (ref 1.005–1.030)
WBC, UA: >50 WBC/hpf — ABNORMAL HIGH (ref 0–5)
pH: 6 (ref 5.0–8.0)

## 2021-04-08 LAB — APTT: aPTT: 30 seconds (ref 24–36)

## 2021-04-08 LAB — PROTIME-INR
INR: 1.1 (ref 0.8–1.2)
Prothrombin Time: 14.7 seconds (ref 11.4–15.2)

## 2021-04-08 LAB — LACTIC ACID, PLASMA: Lactic Acid, Venous: 0.9 mmol/L (ref 0.5–1.9)

## 2021-04-08 MED ORDER — VANCOMYCIN HCL 1000 MG/200ML IV SOLN: 1000.0000 mg | Freq: Once | INTRAVENOUS | Status: DC

## 2021-04-08 MED ORDER — SODIUM CHLORIDE 0.9 % IV SOLN
2.0000 g | Freq: Once | INTRAVENOUS | Status: AC
  Administered 2021-04-08: 2 g via INTRAVENOUS
  Filled 2021-04-08: qty 20

## 2021-04-08 MED ORDER — LACTATED RINGERS IV SOLN: INTRAVENOUS | Status: DC

## 2021-04-08 MED ORDER — VANCOMYCIN HCL 1000 MG/200ML IV SOLN
1000.0000 mg | Freq: Once | INTRAVENOUS | Status: AC
  Administered 2021-04-09: 1000 mg via INTRAVENOUS
  Filled 2021-04-08 (×2): qty 200

## 2021-04-08 MED ORDER — VANCOMYCIN HCL 750 MG/150ML IV SOLN
750.0000 mg | Freq: Three times a day (TID) | INTRAVENOUS | Status: DC
  Administered 2021-04-09 – 2021-04-10 (×4): 750 mg via INTRAVENOUS
  Filled 2021-04-08 (×5): qty 150

## 2021-04-08 NOTE — ED Notes (Signed)
Per lab two sets of BC have already been done on him and he doesn't need any more
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
## 2021-04-08 NOTE — ED Notes (Signed)
Attempted iv x1 was unsuccessful. Pt refused any further iv sticks at this time. While attempting to obtain PIV provider came into the room and attempted to talk to the pt. While talking to the pt pt refused any further sticks at this time. Provider attempted to explain to pt the importance of PIV and bloodwork. Pt refused to listen to provider and provider left the room. Once the provider left the room pt stated the provider had an attitude and if he was going to have that type of attitude and attempt to care for him he didn't want that he wanted another doctor.

## 2021-04-08 NOTE — ED Provider Notes (Signed)
Emergency Medicine Provider Triage Evaluation Note  [name] , a 30 y.o. male  was evaluated in triage.  Pt complains of ischial decubitus ulcer.  Ulcer has worsened over the last few months.  Patient endorses fevers over the last week.  Reports that he has had purulent drainage from his wound.  Patient is paralyzed however endorses stinging sensation to wound.  Patient was seen at wound clinic 5/11 provider there reported concern for necrotic bone.    Review of Systems  Positive: Fever, sinus pressure Negative: Chest pain, shortness of breath, nausea, vomiting, diarrhea,  Physical Exam  BP (!) 155/97 (BP Location: Right Arm)   Pulse (!) 121   Temp 100.3 F (37.9 C) (Oral)   Resp 13   SpO2 98%  Gen:   Awake, no distress   Resp:  Normal effort, lungs clear to auscultation bilaterally MSK:   Patient is paraplegic Other:    Medical Decision Making  Medically screening exam initiated at 8:30 PM.  Appropriate orders placed.  Cinsere L Ryder was informed that the remainder of the evaluation will be completed by another provider, this initial triage assessment does not replace that evaluation, and the importance of remaining in the ED until their evaluation is complete.  Concern for possible sepsis.  Will start ED evolving sepsis.  Will get patient moved back to next room.   Berneice Heinrich 04/08/21 2039    Gilda Crease, MD 04/08/21 [phone]

## 2021-04-08 NOTE — Progress Notes (Signed)
Pharmacy Antibiotic Note  [name] is a 30 y.o. male admitted on 04/08/2021 with cellulitis.  Pharmacy has been consulted for vancomycin dosing.  WBC 18.6, SCr wnl   Plan: -Vancomycin 1 gm IV load followed by Vancomycin 750 mg IV Q 8 hrs. Goal AUC 400-550. Expected AUC: 492 SCr used: 0.7 -Ceftriaxone per MD -MOnitor CBC, renal fx, cultures and clinical progress -Vanc levels as indicated    Height: 5\' 11"  (180.3 cm) Weight: 58 kg (127 lb 13.9 oz) IBW/kg (Calculated) : 75.3  Temp (24hrs), Avg:99.9 F (37.7 C), Min:99.5 F (37.5 C), Max:100.3 F (37.9 C)  Recent Labs  Lab 04/08/21 2059  WBC 18.6*  CREATININE 0.69  LATICACIDVEN 0.9    Estimated Creatinine Clearance: 110.8 mL/min (by C-G formula based on SCr of 0.69 mg/dL).    Allergies  Allergen Reactions  . Gabapentin Other (See Comments)    Severe dizziness and vertigo/lightheadedness- couldn't sit up while on it    Antimicrobials this admission: Vanc 5/13 >>  Ceftriaxone 5/13 >>   Dose adjustments this admission:   Microbiology results:   Thank you for allowing pharmacy to be a part of this patient's care.  6/13, PharmD., BCPS, BCCCP Clinical Pharmacist Please refer to [facility] for unit-specific pharmacist

## 2021-04-08 NOTE — ED Triage Notes (Signed)
Patient reports he was sent by the wound care clinic for pressure ulcer to R buttocks, patient is wheelchair bound at baseline, fever x 1 week, states ulcer is drainage.

## 2021-04-09 ENCOUNTER — Encounter (HOSPITAL_COMMUNITY): Payer: Self-pay | Admitting: Internal Medicine

## 2021-04-09 DIAGNOSIS — Z86718 Personal history of other venous thrombosis and embolism: Secondary | ICD-10-CM | POA: Diagnosis not present

## 2021-04-09 DIAGNOSIS — G894 Chronic pain syndrome: Secondary | ICD-10-CM | POA: Diagnosis present

## 2021-04-09 DIAGNOSIS — M86151 Other acute osteomyelitis, right femur: Secondary | ICD-10-CM

## 2021-04-09 DIAGNOSIS — L89314 Pressure ulcer of right buttock, stage 4: Secondary | ICD-10-CM | POA: Diagnosis present

## 2021-04-09 DIAGNOSIS — F17210 Nicotine dependence, cigarettes, uncomplicated: Secondary | ICD-10-CM | POA: Diagnosis present

## 2021-04-09 DIAGNOSIS — Z7901 Long term (current) use of anticoagulants: Secondary | ICD-10-CM | POA: Diagnosis not present

## 2021-04-09 DIAGNOSIS — D72828 Other elevated white blood cell count: Secondary | ICD-10-CM | POA: Diagnosis present

## 2021-04-09 DIAGNOSIS — G8220 Paraplegia, unspecified: Secondary | ICD-10-CM | POA: Diagnosis present

## 2021-04-09 DIAGNOSIS — Z7401 Bed confinement status: Secondary | ICD-10-CM | POA: Diagnosis not present

## 2021-04-09 DIAGNOSIS — L89520 Pressure ulcer of left ankle, unstageable: Secondary | ICD-10-CM | POA: Diagnosis present

## 2021-04-09 DIAGNOSIS — Z9114 Patient's other noncompliance with medication regimen: Secondary | ICD-10-CM | POA: Diagnosis not present

## 2021-04-09 DIAGNOSIS — Z20822 Contact with and (suspected) exposure to covid-19: Secondary | ICD-10-CM | POA: Diagnosis present

## 2021-04-09 DIAGNOSIS — M8618 Other acute osteomyelitis, other site: Secondary | ICD-10-CM | POA: Diagnosis present

## 2021-04-09 DIAGNOSIS — D638 Anemia in other chronic diseases classified elsewhere: Secondary | ICD-10-CM | POA: Diagnosis present

## 2021-04-09 DIAGNOSIS — D75838 Other thrombocytosis: Secondary | ICD-10-CM | POA: Diagnosis present

## 2021-04-09 DIAGNOSIS — L89154 Pressure ulcer of sacral region, stage 4: Secondary | ICD-10-CM | POA: Diagnosis present

## 2021-04-09 LAB — LACTIC ACID, PLASMA: Lactic Acid, Venous: 1.6 mmol/L (ref 0.5–1.9)

## 2021-04-09 LAB — APTT: aPTT: 33 seconds (ref 24–36)

## 2021-04-09 LAB — CBC
HCT: 29.5 % — ABNORMAL LOW (ref 39.0–52.0)
Hemoglobin: 9 g/dL — ABNORMAL LOW (ref 13.0–17.0)
MCH: 22.2 pg — ABNORMAL LOW (ref 26.0–34.0)
MCHC: 30.5 g/dL (ref 30.0–36.0)
MCV: 72.8 fL — ABNORMAL LOW (ref 80.0–100.0)
Platelets: 713 10*3/uL — ABNORMAL HIGH (ref 150–400)
RBC: 4.05 MIL/uL — ABNORMAL LOW (ref 4.22–5.81)
RDW: 13.9 % (ref 11.5–15.5)
WBC: 17 10*3/uL — ABNORMAL HIGH (ref 4.0–10.5)
nRBC: 0 % (ref 0.0–0.2)

## 2021-04-09 LAB — RESP PANEL BY RT-PCR (FLU A&B, COVID) ARPGX2
Influenza A by PCR: NEGATIVE
Influenza B by PCR: NEGATIVE
SARS Coronavirus 2 by RT PCR: NEGATIVE

## 2021-04-09 LAB — HIV ANTIBODY (ROUTINE TESTING W REFLEX): HIV Screen 4th Generation wRfx: NONREACTIVE

## 2021-04-09 LAB — BASIC METABOLIC PANEL
Anion gap: 10 (ref 5–15)
BUN: <5 mg/dL — ABNORMAL LOW (ref 6–20)
CO2: 25 mmol/L (ref 22–32)
Calcium: 7.9 mg/dL — ABNORMAL LOW (ref 8.9–10.3)
Chloride: 96 mmol/L — ABNORMAL LOW (ref 98–111)
Creatinine, Ser: 0.65 mg/dL (ref 0.61–1.24)
GFR, Estimated: >60 mL/min (ref >60)
Glucose, Bld: 127 mg/dL — ABNORMAL HIGH (ref 70–99)
Potassium: 3.8 mmol/L (ref 3.5–5.1)
Sodium: 131 mmol/L — ABNORMAL LOW (ref 135–145)

## 2021-04-09 LAB — SEDIMENTATION RATE: Sed Rate: 70 mm/hr — ABNORMAL HIGH (ref 0–16)

## 2021-04-09 LAB — HEPARIN LEVEL (UNFRACTIONATED): Heparin Unfractionated: <0.1 IU/mL — ABNORMAL LOW (ref 0.30–0.70)

## 2021-04-09 MED ORDER — SODIUM CHLORIDE 0.9 % IV SOLN
2.0000 g | Freq: Three times a day (TID) | INTRAVENOUS | Status: DC
  Administered 2021-04-09 – 2021-04-10 (×5): 2 g via INTRAVENOUS
  Filled 2021-04-09 (×5): qty 2

## 2021-04-09 MED ORDER — DOCUSATE SODIUM 50 MG PO CAPS
50.0000 mg | ORAL_CAPSULE | Freq: Two times a day (BID) | ORAL | Status: DC | PRN
  Filled 2021-04-09 (×2): qty 1

## 2021-04-09 MED ORDER — HEPARIN BOLUS VIA INFUSION
3000.0000 [IU] | Freq: Once | INTRAVENOUS | Status: DC
  Filled 2021-04-09: qty 3000

## 2021-04-09 MED ORDER — TRAMADOL HCL 50 MG PO TABS
100.0000 mg | ORAL_TABLET | Freq: Four times a day (QID) | ORAL | Status: DC
Start: 2021-04-09 — End: 2021-04-10
  Administered 2021-04-09 – 2021-04-10 (×5): 100 mg via ORAL
  Filled 2021-04-09 (×6): qty 2

## 2021-04-09 MED ORDER — DOCUSATE SODIUM 50 MG PO CAPS
50.0000 mg | ORAL_CAPSULE | Freq: Two times a day (BID) | ORAL | Status: DC
  Filled 2021-04-09 (×4): qty 1

## 2021-04-09 MED ORDER — TRAZODONE HCL 100 MG PO TABS: 100.0000 mg | ORAL_TABLET | Freq: Every evening | ORAL | Status: DC | PRN

## 2021-04-09 MED ORDER — MINERAL OIL RE ENEM
1.0000 | ENEMA | Freq: Once | RECTAL | Status: AC
  Administered 2021-04-09: 1 via RECTAL
  Filled 2021-04-09: qty 1

## 2021-04-09 MED ORDER — POLYETHYLENE GLYCOL 3350 17 G PO PACK: 17.0000 g | PACK | Freq: Every day | ORAL | Status: DC

## 2021-04-09 MED ORDER — HEPARIN (PORCINE) 25000 UT/250ML-% IV SOLN
1000.0000 [IU]/h | INTRAVENOUS | Status: DC
  Filled 2021-04-09: qty 250

## 2021-04-09 MED ORDER — ENSURE ENLIVE PO LIQD
237.0000 mL | Freq: Three times a day (TID) | ORAL | Status: DC
  Administered 2021-04-09 (×2): 237 mL via ORAL

## 2021-04-09 MED ORDER — BACLOFEN 10 MG PO TABS
40.0000 mg | ORAL_TABLET | Freq: Four times a day (QID) | ORAL | Status: DC
  Administered 2021-04-09 – 2021-04-10 (×6): 40 mg via ORAL
  Filled 2021-04-09 (×6): qty 4

## 2021-04-09 MED ORDER — LACTATED RINGERS IV SOLN: INTRAVENOUS | Status: AC

## 2021-04-09 MED ORDER — OXYCODONE HCL 5 MG PO TABS
10.0000 mg | ORAL_TABLET | Freq: Four times a day (QID) | ORAL | Status: DC | PRN
Start: 2021-04-09 — End: 2021-04-10
  Administered 2021-04-09 – 2021-04-10 (×3): 10 mg via ORAL
  Filled 2021-04-09 (×3): qty 2

## 2021-04-09 MED ORDER — ACETAMINOPHEN 325 MG PO TABS: 650.0000 mg | ORAL_TABLET | Freq: Four times a day (QID) | ORAL | Status: DC | PRN

## 2021-04-09 MED ORDER — ACETAMINOPHEN 650 MG RE SUPP: 650.0000 mg | Freq: Four times a day (QID) | RECTAL | Status: DC | PRN
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
## 2021-04-09 NOTE — Progress Notes (Signed)
Received pt alert and oriented. Pt is paraplegic. Unstageable pressure ulcer to right buttock noted. Dressing changed. Stage 2 wound noted on the sacrum, applied foam.  Oriented pt to care.

## 2021-04-09 NOTE — Progress Notes (Signed)
Pt refused heparin IV at this time. Explained to pt the need for this blood thinner med. Pt needs further education. Notified MD on call.
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
## 2021-04-09 NOTE — Progress Notes (Signed)
Physical Therapy Wound Evaluation and Treatment Patient Details  Name: [name] MRN: [id number] Date of Birth: Feb 05, 1991  Today's Date: 04/09/2021 Time: 9371-6967 Time Calculation (min): 44 min  Subjective     Pain Score:    Wound Assessment  Pressure Injury 04/09/21 Buttocks Right Unstageable - Full thickness tissue loss in which the base of the injury is covered by slough (yellow, tan, gray, green or brown) and/or eschar (tan, brown or black) in the wound bed. (Active)  Wound Image    04/09/21 1424  Dressing Type Barrier Film (skin prep);Foam - Lift dressing to assess site every shift;Gauze (Comment);Moist to moist 04/09/21 1424  Dressing Changed;Clean;Dry;Intact 04/09/21 1424  Dressing Change Frequency Twice a day 04/09/21 1424  State of Healing Eschar 04/09/21 1424  Site / Wound Assessment Black;Brown;Granulation tissue;Pink 04/09/21 1424  % Wound base Red or Granulating 70% 04/09/21 1424  % Wound base Yellow/Fibrinous Exudate 10% 04/09/21 1424  % Wound base Black/Eschar 20% 04/09/21 1424  Peri-wound Assessment Intact 04/09/21 1424  Wound Length (cm) 7.8 cm 04/09/21 1424  Wound Width (cm) 6.2 cm 04/09/21 1424  Wound Depth (cm) 4.2 cm 04/09/21 1424  Wound Surface Area (cm^2) 48.36 cm^2 04/09/21 1424  Wound Volume (cm^3) 203.11 cm^3 04/09/21 1424  Margins Unattached edges (unapproximated) 04/09/21 1424  Drainage Amount Copious 04/09/21 1424  Drainage Description Serosanguineous 04/09/21 1424  Treatment Debridement (Selective);Hydrotherapy (Pulse lavage);Packing (Saline gauze) 04/09/21 1424      Hydrotherapy Pulsed lavage therapy - wound location: rt ischium Pulsed Lavage with Suction (psi): 8 psi (to 12) Pulsed Lavage with Suction - Normal Saline Used: 1000 mL Pulsed Lavage Tip: Tip with splash shield Selective Debridement Selective Debridement - Location: rt ishcium Selective Debridement - Tools Used: Forceps,Scalpel Selective Debridement - Tissue Removed: black  eschar; brown/gray slough    Wound Assessment and Plan  Wound Therapy - Assess/Plan/Recommendations Wound Therapy - Clinical Statement: Patient presents with pressure ulcer with 30% necrotic tissue and copious drainage. Patient can benefit from hydrotherapy followed by sharp debridement to decrease the necrotic tissue, bioburden, and promote healing/granulation. Wound Therapy - Functional Problem List: limited mobility due to paraplegia with decr skin integrity of weight-bearing surface (ischium) Factors Delaying/Impairing Wound Healing: Altered sensation,Incontinence,Infection - systemic/local,Immobility Hydrotherapy Plan: Debridement,Dressing change,Patient/family education,Pulsatile lavage with suction Wound Therapy - Frequency: 6X / week Wound Therapy - Follow Up Recommendations: f/u selective debridement,dressing changes by family/patient  Wound Therapy Goals- Improve the function of patient's integumentary system by progressing the wound(s) through the phases of wound healing (inflammation - proliferation - remodeling) by: Wound Therapy Goals - Improve the function of patient's integumentary system by progressing the wound(s) through the phases of wound healing by: Decrease Necrotic Tissue to: 10 Decrease Necrotic Tissue - Progress: Goal set today Increase Granulation Tissue to: 90 Increase Granulation Tissue - Progress: Goal set today Time For Goal Achievement: 2 weeks Wound Therapy - Potential for Goals: Good  Goals will be updated until maximal potential achieved or discharge criteria met.  Discharge criteria: when goals achieved, discharge from hospital, MD decision/surgical intervention, no progress towards goals, refusal/missing three consecutive treatments without notification or medical reason.  GP     Charges PT Wound Care Charges $Wound Debridement up to 20 cm: < or equal to 20 cm $PT Hydrotherapy Dressing: 1 dressing $PT Hydrotherapy Visit: 1 Visit       Arby Barrette,  PT Pager [phone]  Rexanne Mano 04/09/2021, 2:37 PM
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
## 2021-04-10 LAB — URINE CULTURE

## 2021-04-10 LAB — CBC
HCT: 28.8 % — ABNORMAL LOW (ref 39.0–52.0)
Hemoglobin: 8.8 g/dL — ABNORMAL LOW (ref 13.0–17.0)
MCH: 22.8 pg — ABNORMAL LOW (ref 26.0–34.0)
MCHC: 30.6 g/dL (ref 30.0–36.0)
MCV: 74.6 fL — ABNORMAL LOW (ref 80.0–100.0)
Platelets: 726 10*3/uL — ABNORMAL HIGH (ref 150–400)
RBC: 3.86 MIL/uL — ABNORMAL LOW (ref 4.22–5.81)
RDW: 14.4 % (ref 11.5–15.5)
WBC: 11 10*3/uL — ABNORMAL HIGH (ref 4.0–10.5)
nRBC: 0 % (ref 0.0–0.2)

## 2021-04-10 LAB — TROPONIN I (HIGH SENSITIVITY)
Troponin I (High Sensitivity): 4 ng/L (ref <18)
Troponin I (High Sensitivity): 3 ng/L (ref <18)

## 2021-04-10 MED ORDER — CEPHALEXIN 500 MG PO CAPS: 500.0000 mg | ORAL_CAPSULE | Freq: Three times a day (TID) | ORAL | 0 refills | Status: AC

## 2021-04-10 MED ORDER — MORPHINE SULFATE (PF) 2 MG/ML IV SOLN
2.0000 mg | Freq: Once | INTRAVENOUS | Status: AC
Start: 2021-04-10 — End: 2021-04-10
  Administered 2021-04-10: 2 mg via INTRAVENOUS
  Filled 2021-04-10: qty 1

## 2021-04-10 MED ORDER — DOXYCYCLINE MONOHYDRATE 100 MG PO TABS: 100.0000 mg | ORAL_TABLET | Freq: Two times a day (BID) | ORAL | 0 refills | Status: AC

## 2021-04-10 NOTE — Progress Notes (Signed)
Patient would like to leave the hospital AMA. MD aware.

## 2021-04-10 NOTE — Progress Notes (Signed)
Patient rating chest pain 7/10. One time dose of morphine given. Patient instructed to call if he needs assistance. Verbalized understanding.

## 2021-04-10 NOTE — Discharge Summary (Signed)
Patient admitted with infected decubitus ulcer, on IV antibiotics and plan was to continue hydrotherapy.  Patient wants to leave today at any cost.  Plan: Leaving AGAINST MEDICAL ADVICE Blood cultures negative so far, WBC count normalized, afebrile. Local cultures no growth, urine culture with multiple organisms. Since he is at the staying in the hospital, will prescribe doxycycline for 14 days, Keflex for 10 days. Advised him to follow-up with his wound care clinic immediately and report to the hospital for any worsening symptoms.

## 2021-04-10 NOTE — Progress Notes (Signed)
Received a call from bedside RN regarding patient having chest pain.  12 lead EKG and stat troponin ordered.  IV morphine for pain relief.  We wll place on remote telemetry and closely monitor for any acute changes.

## 2021-04-10 NOTE — Progress Notes (Signed)
Writer was called by CNA. NA stated that patient said his chest hurts. When writer went to room and about to assess patient , he was mad and irritated saying "I already said my chest hurts". Patient refused to be assessed. Dr. Margo Aye made aware. MD went to check patient in the room. New orders received EKG and Troponin . Will continue to monitor.
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
## 2021-04-10 NOTE — Progress Notes (Signed)
Patient refused to sign AMA paper sheet. Patient wheeled himself out of the hospital

## 2021-04-10 NOTE — Plan of Care (Signed)
  Problem: Education: Goal: Knowledge of General Education information will improve Description: Including pain rating scale, medication(s)/side effects and non-pharmacologic comfort measures 04/10/2021 1509 by Cahterine Heinzel, Caren Griffins, RN Outcome: Progressing    Problem: Health Behavior/Discharge Planning: Goal: Ability to manage health-related needs will improve 04/10/2021 1509 by Garry Bochicchio, Caren Griffins, RN Outcome: Progressing    Problem: Clinical Measurements: Goal: Ability to maintain clinical measurements within normal limits will improve 04/10/2021 1509 by Deaglan Lile, Caren Griffins, RN Outcome: Progressing  Goal: Will remain free from infection 04/10/2021 1509 by Ubah Radke, Caren Griffins, RN Outcome: Progressing  Goal: Diagnostic test results will improve 04/10/2021 1509 by Athelene Hursey, Caren Griffins, RN Outcome: Progressi Goal: Respiratory complications will improve 04/10/2021 1509 by Brodey Bonn, Caren Griffins, RN Outcome: Progressing  Goal: Cardiovascular complication will be avoided 04/10/2021 1509 by Kemarion Abbey, Caren Griffins, RN Outcome: Progressing    Problem: Clinical Measurements: Goal: Will remain free from infection 04/10/2021 1509 by Hammad Finkler, Caren Griffins, RN Outcome: Progressing Problem: Activity: Goal: Risk for activity intolerance will decrease 04/10/2021 1509 by Kae Lauman, Caren Griffins, RN Outcome: Progressing   Problem: Nutrition: Goal: Adequate nutrition will be maintained 04/10/2021 1509 by Myndi Wamble, Caren Griffins, RN Outcome: Progressing 5Problem: Coping: Goal: Level of anxiety will decrease 04/10/2021 1509 by Silvester Reierson, Caren Griffins, RN Outcome: Progressing 5Problem: Elimination: Goal: Will not experience complications related to bowel motility 04/10/2021 1509 by Finbar Nippert, Caren Griffins, RN Outcome: Progressing  Goal: Will not experience complications related to urinary retention 04/10/2021 1509 by Sonoma Firkus, Caren Griffins, RN Outcome: Progressing    Problem: Elimination: Goal: Will not experience complications related to  urinary retention 04/10/2021 1509 by Rashaad Hallstrom, Caren Griffins, RN Outcome: Progressing 04/10/2021 1320 by Keisean Skowron, Caren Griffins, RN Problem: Pain Managment: Goal: General experience of comfort will improve 04/10/2021 1509 by Norena Bratton, Caren Griffins, RN Outcome: Progressing 04/10/2021 1320 by Arica Bevilacqua, Caren Griffins, RN Problem: Safety: Goal: Ability to remain free from injury will improve 04/10/2021 1509 by Doneshia Hill, Caren Griffins, RN Outcome: Progressing 04/10/2021 1320 by Elky Funches, Caren Griffins, RN OProblem: Skin Integrity  Goal: Risk for impaired skin integrity will decrease 04/10/2021 1509 by Mikele Sifuentes, Caren Griffins, RN Outcome: Progressing 04/10/2021 1320 by Nichola Warren, Caren Griffins, RN

## 2021-04-10 NOTE — Plan of Care (Addendum)
  Problem: Health Behavior/Discharge Planning: Goal: Ability to manage health-related needs will improve Outcome: progressing  Patient requested to be discharged.MD aware. Patient to leave AMA.

## 2021-04-13 LAB — CULTURE, BLOOD (ROUTINE X 2)
Culture: NO GROWTH
Culture: NO GROWTH
Special Requests: ADEQUATE
Special Requests: ADEQUATE

## 2021-04-15 ENCOUNTER — Encounter (HOSPITAL_BASED_OUTPATIENT_CLINIC_OR_DEPARTMENT_OTHER): Payer: Medicaid Other | Admitting: Internal Medicine

## 2021-04-15 ENCOUNTER — Telehealth: Payer: Self-pay | Admitting: Physical Medicine and Rehabilitation

## 2021-04-15 NOTE — Telephone Encounter (Signed)
texted pt through mychart after I called him to remind him of using miralax and Dulcolax suppository and use Mg citrate and Fleet's enema as needed.   Call/text through mychart to keep me in touch on how things going

## 2021-04-20 ENCOUNTER — Other Ambulatory Visit: Payer: Self-pay

## 2021-04-20 ENCOUNTER — Telehealth: Payer: Self-pay | Admitting: Physical Medicine and Rehabilitation

## 2021-04-20 ENCOUNTER — Emergency Department (HOSPITAL_COMMUNITY)
Admission: EM | Admit: 2021-04-20 | Discharge: 2021-04-20 | Disposition: A | Discharge status: Home / Self Care | Payer: Medicaid Other | Attending: Emergency Medicine | Admitting: Emergency Medicine

## 2021-04-20 ENCOUNTER — Encounter (HOSPITAL_COMMUNITY): Payer: Self-pay

## 2021-04-20 DIAGNOSIS — R339 Retention of urine, unspecified: Secondary | ICD-10-CM | POA: Insufficient documentation

## 2021-04-20 DIAGNOSIS — Z7901 Long term (current) use of anticoagulants: Secondary | ICD-10-CM | POA: Diagnosis not present

## 2021-04-20 DIAGNOSIS — F17210 Nicotine dependence, cigarettes, uncomplicated: Secondary | ICD-10-CM | POA: Diagnosis not present

## 2021-04-20 DIAGNOSIS — R3989 Other symptoms and signs involving the genitourinary system: Secondary | ICD-10-CM

## 2021-04-20 LAB — URINALYSIS, ROUTINE W REFLEX MICROSCOPIC
Bilirubin Urine: NEGATIVE
Glucose, UA: NEGATIVE mg/dL
Ketones, ur: NEGATIVE mg/dL
Leukocytes,Ua: NEGATIVE
Nitrite: NEGATIVE
Protein, ur: 30 mg/dL — AB
Specific Gravity, Urine: 1.023 (ref 1.005–1.030)
pH: 7 (ref 5.0–8.0)

## 2021-04-20 MED ORDER — TRAMADOL HCL 50 MG PO TABS
50.0000 mg | ORAL_TABLET | Freq: Once | ORAL | Status: AC
  Administered 2021-04-20: 50 mg via ORAL
  Filled 2021-04-20: qty 1

## 2021-04-20 MED ORDER — BACLOFEN 20 MG PO TABS
20.0000 mg | ORAL_TABLET | Freq: Three times a day (TID) | ORAL | Status: DC
  Administered 2021-04-20: 20 mg via ORAL
  Filled 2021-04-20: qty 1
  Filled 2021-04-20: qty 2

## 2021-04-20 MED ORDER — OXYCODONE HCL 5 MG PO TABS
10.0000 mg | ORAL_TABLET | Freq: Once | ORAL | Status: AC
  Administered 2021-04-20: 10 mg via ORAL
  Filled 2021-04-20: qty 2

## 2021-04-20 NOTE — ED Provider Notes (Signed)
Emergency Medicine Provider Triage Evaluation Note  [name] , a 30 y.o. male  was evaluated in triage.  Pt complains of urinary retention that started 4 days ago. States he wears a condom cath and he has had decreased drainage in his bag  Review of Systems  Positive: Urinary retention Negative: fever  Physical Exam  There were no vitals taken for this visit. Gen:   Awake, no distress   Resp:  Normal effort  MSK:   Moves extremities without difficulty   Medical Decision Making  Medically screening exam initiated at 12:56 PM.  Appropriate orders placed.  Tomie L Mitton was informed that the remainder of the evaluation will be completed by another provider, this initial triage assessment does not replace that evaluation, and the importance of remaining in the ED until their evaluation is complete.    Rayne Du 04/20/21 1300    Jacalyn Lefevre, MD 04/21/21 [phone]
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
## 2021-04-20 NOTE — ED Notes (Signed)
Pt refused bloodwork to be collected. PA, sponseller aware

## 2021-04-20 NOTE — Discharge Instructions (Addendum)
You were seen in the ER for concern about your abdominal distention and possible decrease in your urine.  No signs of infection in your urine; additionally based on your physical exam and catheterization, does not appear you are retaining significant amount of urine. You also requested removal of the foley catheter prior to your discharge; please continue using condom catheters at home. Recommend you follow-up closely with your urologist to discuss further management of your urinary symptoms.  You declined blood work today.  Please follow-up with your primary care doctor for monitoring of your blood work, as well as wound care center for management of the wound to you right buttock.  Please continue take the antibiotics you were prescribed when you recently left the hospital.  Return to the emergency department develop any fevers, chills, nausea or vomiting does not stop,  if you are not making any urine, or you develop any other severe symptoms.

## 2021-04-20 NOTE — Telephone Encounter (Signed)
As per my chart response to pt- needs to see hospital and get foley placed- until can f/u with urolog-y needs to see them asap.   Thanks, ML

## 2021-04-20 NOTE — ED Notes (Signed)
No bladder scanner available. PA, Sponseller aware.

## 2021-04-20 NOTE — ED Notes (Signed)
Pt wound site changed. Site irrigated with 1L of normal saline. Packed with saline soaked gauze, covered with gauze, and reinforced with medipore tape.

## 2021-04-20 NOTE — ED Triage Notes (Signed)
Patient complains of urinary retention for the past 4 days. Patient as a paraplegic and normally able to urinate. Patient unable to empty bladder. Patient alert and oriented. Patient wears condom cath and no urine in bad, sent by primary MD for foley placement

## 2021-04-21 LAB — URINE CULTURE: Culture: NO GROWTH

## 2021-04-22 ENCOUNTER — Other Ambulatory Visit: Payer: Self-pay

## 2021-04-22 ENCOUNTER — Encounter (HOSPITAL_BASED_OUTPATIENT_CLINIC_OR_DEPARTMENT_OTHER): Payer: Medicaid Other | Admitting: Internal Medicine

## 2021-04-22 ENCOUNTER — Telehealth: Payer: Self-pay | Admitting: Physical Medicine and Rehabilitation

## 2021-04-22 DIAGNOSIS — L89523 Pressure ulcer of left ankle, stage 3: Secondary | ICD-10-CM | POA: Diagnosis not present

## 2021-04-22 DIAGNOSIS — L89310 Pressure ulcer of right buttock, unstageable: Secondary | ICD-10-CM

## 2021-04-22 DIAGNOSIS — E43 Unspecified severe protein-calorie malnutrition: Secondary | ICD-10-CM | POA: Diagnosis not present

## 2021-04-22 DIAGNOSIS — N319 Neuromuscular dysfunction of bladder, unspecified: Secondary | ICD-10-CM

## 2021-04-22 DIAGNOSIS — F17200 Nicotine dependence, unspecified, uncomplicated: Secondary | ICD-10-CM | POA: Diagnosis not present

## 2021-04-22 DIAGNOSIS — G8221 Paraplegia, complete: Secondary | ICD-10-CM | POA: Diagnosis not present

## 2021-04-22 DIAGNOSIS — Z86718 Personal history of other venous thrombosis and embolism: Secondary | ICD-10-CM | POA: Diagnosis not present

## 2021-04-22 DIAGNOSIS — M8668 Other chronic osteomyelitis, other site: Secondary | ICD-10-CM | POA: Diagnosis not present

## 2021-04-22 DIAGNOSIS — L89154 Pressure ulcer of sacral region, stage 4: Secondary | ICD-10-CM | POA: Diagnosis not present

## 2021-04-22 DIAGNOSIS — Z681 Body mass index (BMI) 19 or less, adult: Secondary | ICD-10-CM | POA: Diagnosis not present

## 2021-04-22 NOTE — Telephone Encounter (Signed)
Placed another consult for Urology- pt hasn't been able to get others scheduled, but wil try again.

## 2021-04-26 NOTE — Progress Notes (Signed)
[name], [name] ([id number]) Visit Report for 04/22/2021 Arrival Information Details Patient Name: Date of Service: [name], [name] 04/22/2021 9:15 [name] M Medical Record Number: [id number] Patient Account Number: [id number] Date of Birth/Sex: Treating RN: Feb 16, 1991 (29 y.o. [name] Primary Care Shalva Rozycki: PCP, NO Other Clinician: Referring Mekaylah Klich: Treating Akiah Bauch/Extender: Malachi Pro in Treatment: 38 Visit Information History Since Last Visit Added or deleted any medications: No Patient Arrived: Wheel Chair Any new allergies or adverse reactions: No Arrival Time: 09:41 Had [name] fall or experienced change in No Accompanied By: self activities of daily living that may affect Transfer Assistance: None risk of falls: Patient Identification Verified: Yes Signs or symptoms of abuse/neglect since last visito No Secondary Verification Process Completed: Yes Hospitalized since last visit: No Patient Requires Transmission-Based Precautions: No Implantable device outside of the clinic excluding No Patient Has Alerts: No cellular tissue based products placed in the center since last visit: Has Dressing in Place as Prescribed: Yes Pain Present Now: No Electronic Signature(s) Signed: 04/26/2021 5:24:49 PM By: [name] Ito Entered By: [name] Ito on 04/22/2021 09:42:47 -------------------------------------------------------------------------------- Encounter Discharge Information Details Patient Name: Date of Service: [name] March L. 04/22/2021 9:15 [name] M Medical Record Number: [id number] Patient Account Number: [id number] Date of Birth/Sex: Treating RN: 1991/06/17 (29 y.o. [name] Primary Care Humberto Addo: PCP, NO Other Clinician: Referring Brayton Baumgartner: Treating Asli Tokarski/Extender: Malachi Pro in Treatment: 38 Encounter Discharge Information Items Post Procedure Vitals Discharge Condition:  Stable Temperature (F): 98.5 Ambulatory Status: Wheelchair Pulse (bpm): 113 Discharge Destination: Home Respiratory Rate (breaths/min): 18 Transportation: Private Auto Blood Pressure (mmHg): 125/67 Accompanied By: self Schedule Follow-up Appointment: Yes Clinical Summary of Care: Electronic Signature(s) Signed: 04/22/2021 1:38:01 PM By: Shawn Stall Entered By: Shawn Stall on 04/22/2021 13:31:39 -------------------------------------------------------------------------------- Lower Extremity Assessment Details Patient Name: Date of Service: [name], [name] 04/22/2021 9:15 [name] M Medical Record Number: [id number] Patient Account Number: [id number] Date of Birth/Sex: Treating RN: [birth year] (29 y.o. [name] Primary Care Deslyn Cavenaugh: PCP, NO Other Clinician: Referring Shamel Germond: Treating Michaelah Credeur/Extender: Lynne Logan Weeks in Treatment: 38 Electronic Signature(s) Signed: 04/22/2021 1:31:18 PM By: Antonieta Iba Entered By: Antonieta Iba on 04/22/2021 10:40:49 -------------------------------------------------------------------------------- Multi Wound Chart Details Patient Name: Date of Service: [name] March L. 04/22/2021 9:15 [name] M Medical Record Number: [id number] Patient Account Number: [id number] Date of Birth/Sex: Treating RN: 1990/12/22 (29 y.o. [name] Primary Care Abie Cheek: PCP, NO Other Clinician: Referring Jerrie Gullo: Treating Richanda Darin/Extender: Malachi Pro in Treatment: 38 Vital Signs Height(in): 71 Pulse(bpm): 113 Weight(lbs): 136 Blood Pressure(mmHg): 125/67 Body Mass Index(BMI): 19 Temperature(F): 98.5 Respiratory Rate(breaths/min): 18 Photos: [3:No Photos Left, Lateral Malleolus] [6:No Photos Right Ischium] [7:No Photos Sacrum] Wound Location: [3:Gradually Appeared] [6:Trauma] [7:Pressure Injury] Wounding Event: [3:Pressure Ulcer] [6:Pressure Ulcer] [7:Pressure Ulcer] Primary  Etiology: [3:Paraplegia] [6:Paraplegia] [7:Paraplegia] Comorbid History: [3:06/27/2020] [6:02/10/2021] [7:04/06/2021] Date Acquired: [3:38] [6:9] [7:2] Weeks of Treatment: [3:Open] [6:Open] [7:Healed - Epithelialized] Wound Status: [3:0.1x0.1x0.1] [6:7x4.5x4.4] [7:0x0x0] Measurements L x W x D (cm) [3:0.008] [6:24.74] [7:0] [name] (cm) : rea [3:0.001] [6:108.856] [7:0] Volume (cm) : [3:99.80%] [6:-61.50%] [7:100.00%] % Reduction in [name] rea: [3:100.00%] [6:-7005.50%] [7:100.00%] % Reduction in Volume: [6:12] Position 1 (o'clock): [6:8.3] Maximum Distance 1 (cm): [3:No] [6:Yes] [7:No] Tunneling: [3:Category/Stage IV] [6:Category/Stage IV] [7:Category/Stage II] Classification: [3:None Present] [6:Large] [7:None Present] Exudate [name] mount: [3:N/[name]] [6:Purulent] [7:N/[name]] Exudate Type: [3:N/[name]] [6:yellow, brown, green] [7:N/[name]] Exudate Color: [3:No] [6:Yes] [7:No] Foul Odor [name] Cleansing: [3:fter N/[name]] [  6:Yes] [7:N/[name]] Odor [name] nticipated Due to Product Use: [3:Flat and Intact] [6:Well defined, not attached] [7:N/[name]] Wound Margin: [3:None Present (0%)] [6:Large (67-100%)] [7:None Present (0%)] Granulation [name] mount: [3:N/[name]] [6:Red, Pink] [7:N/[name]] Granulation Quality: [3:None Present (0%)] [6:Small (1-33%)] [7:None Present (0%)] Necrotic [name] mount: [3:Fascia: No] [6:Fat Layer (Subcutaneous Tissue): Yes Fascia: No] Exposed Structures: [3:Fat Layer (Subcutaneous Tissue): No Tendon: No Muscle: No Joint: No Bone: No Large (67-100%)] [6:Tendon: Yes Bone: Yes Fascia: No Muscle: No Joint: No None] [7:Fat Layer (Subcutaneous Tissue): No Tendon: No Muscle: No Joint: No Bone: No Large  (67-100%)] Epithelialization: [3:N/[name]] [6:Debridement - Selective/Open Wound N/[name]] Debridement: [3:N/[name]] [6:10:40] [7:N/[name]] Pre-procedure Verification/Time Out Taken: [3:N/[name]] [6:Necrotic/Eschar, Slough] [7:N/[name]] Tissue Debrided: [3:N/[name]] [6:Non-Viable Tissue] [7:N/[name]] Level: [3:N/[name]] [6:20] [7:N/[name]] Debridement [name] (sq cm): [3:rea N/[name]] [6:Forceps,  Scissors] [7:N/[name]] Instrument: [3:N/[name]] [6:Minimum] [7:N/[name]] Bleeding: [3:N/[name]] [6:Pressure] [7:N/[name]] Hemostasis [name] chieved: [3:N/[name]] [6:Procedure was tolerated well] [7:N/[name]] Debridement Treatment Response: [3:N/[name]] [6:7x4.5x4.4] [7:N/[name]] Post Debridement Measurements L x W x D (cm) [3:N/[name]] [6:108.856] [7:N/[name]] Post Debridement Volume: (cm) [3:N/[name]] [6:Category/Stage IV] [7:N/[name]] Post Debridement Stage: [3:scabbed] [6:N/[name]] [7:N/[name]] Assessment Notes: [3:N/[name]] [6:Debridement] [7:N/[name]] Treatment Notes Electronic Signature(s) Signed: 04/22/2021 10:58:30 AM By: Geralyn Corwin DO Signed: 04/22/2021 1:31:18 PM By: Antonieta Iba Entered By: Geralyn Corwin on 04/22/2021 10:45:57 -------------------------------------------------------------------------------- Multi-Disciplinary Care Plan Details Patient Name: Date of Service: [name] March L. 04/22/2021 9:15 [name] M Medical Record Number: [id number] Patient Account Number: [id number] Date of Birth/Sex: Treating RN: 09/13/1991 (29 y.o. [name] Primary Care Ema Hebner: PCP, NO Other Clinician: Referring Amarien Carne: Treating Destani Wamser/Extender: Malachi Pro in Treatment: 38 Multidisciplinary Care Plan reviewed with physician Active Inactive Pressure Nursing Diagnoses: Knowledge deficit related to causes and risk factors for pressure ulcer development Goals: Patient/caregiver will verbalize risk factors for pressure ulcer development Date Initiated: 07/30/2020 Target Resolution Date: 04/27/2021 Goal Status: Active Interventions: Provide education on pressure ulcers Notes: Wound/Skin Impairment Nursing Diagnoses: Impaired tissue integrity Goals: Patient/caregiver will verbalize understanding of skin care regimen Date Initiated: 01/05/2021 Target Resolution Date: 04/27/2021 Goal Status: Active Ulcer/skin breakdown will have [name] volume reduction of 50% by week 8 Date Initiated: 07/30/2020 Date Inactivated: 10/20/2020 Target  Resolution Date: 09/27/2020 Goal Status: Unmet Unmet Reason: Infection Ulcer/skin breakdown will have [name] volume reduction of 80% by week 12 Date Initiated: 10/20/2020 Date Inactivated: 12/22/2020 Target Resolution Date: 11/19/2020 Unmet Reason: paraplegic, difficulty Unmet Reason: paraplegic, difficulty Goal Status: Unmet offloading Interventions: Provide education on ulcer and skin care Notes: Electronic Signature(s) Signed: 04/22/2021 1:31:18 PM By: Antonieta Iba Entered By: Antonieta Iba on 04/22/2021 09:52:46 -------------------------------------------------------------------------------- Pain Assessment Details Patient Name: Date of Service: JIOVANNI, HEETER L. 04/22/2021 9:15 [name] M Medical Record Number: [id number] Patient Account Number: [id number] Date of Birth/Sex: Treating RN: June 16, 1991 (29 y.o. [name] Primary Care Kamisha Ell: PCP, NO Other Clinician: Referring Dashiell Franchino: Treating Queenie Aufiero/Extender: Malachi Pro in Treatment: 38 Active Problems Location of Pain Severity and Description of Pain Patient Has Paino No Site Locations Pain Management and Medication Current Pain Management: Electronic Signature(s) Signed: 04/22/2021 1:31:18 PM By: Antonieta Iba Signed: 04/26/2021 5:24:49 PM By: [name] Ito Entered By: [name] Ito on 04/22/2021 09:43:22 -------------------------------------------------------------------------------- Patient/Caregiver Education Details Patient Name: Date of Service: [name] 5/27/2022andnbsp9:15 [name] M Medical Record Number: [id number] Patient Account Number: [id number] Date of Birth/Gender: Treating RN: 1991-05-06 (29 y.o. [name] Primary Care Physician: PCP, NO Other Clinician: Referring Physician: Treating Physician/Extender: Malachi Pro in Treatment: 84 Education Assessment Education  Provided To: Patient Education Topics  Provided Pressure: Methods: Explain/Verbal, Printed Responses: State content correctly Wound/Skin Impairment: Methods: Explain/Verbal, Printed Responses: State content correctly Electronic Signature(s) Signed: 04/22/2021 1:31:18 PM By: Antonieta Iba Entered By: Antonieta Iba on 04/22/2021 09:53:08 -------------------------------------------------------------------------------- Wound Assessment Details Patient Name: Date of Service: [name], [name] L. 04/22/2021 9:15 [name] M Medical Record Number: [id number] Patient Account Number: [id number] Date of Birth/Sex: Treating RN: 12-04-1990 (29 y.o. [name] Primary Care Chaunte Hornbeck: PCP, NO Other Clinician: Referring Ninetta Adelstein: Treating Yobany Vroom/Extender: Malachi Pro in Treatment: 38 Wound Status Wound Number: 3 Primary Etiology: Pressure Ulcer Wound Location: Left, Lateral Malleolus Wound Status: Healed - Epithelialized Wounding Event: Gradually Appeared Comorbid History: Paraplegia Date Acquired: 06/27/2020 Weeks Of Treatment: 38 Clustered Wound: No Photos Wound Measurements Length: (cm) Width: (cm) Depth: (cm) Area: (cm) Volume: (cm) 0 % Reduction in Area: 100% 0 % Reduction in Volume: 100% 0 0 0 Wound Description Classification: Category/Stage IV Assessment Notes scabbed Electronic Signature(s) Signed: 04/26/2021 5:24:49 PM By: [name] Ito Signed: 04/26/2021 5:28:21 PM By: Antonieta Iba Previous Signature: 04/22/2021 10:53:03 AM Version By: Antonieta Iba Entered By: [name] Ito on 04/26/2021 15:55:00 -------------------------------------------------------------------------------- Wound Assessment Details Patient Name: Date of Service: [name] March L. 04/22/2021 9:15 [name] M Medical Record Number: [id number] Patient Account Number: [id number] Date of Birth/Sex: Treating RN: 03-20-1991 (29 y.o. [name] Primary Care Donique Hammonds: PCP, NO Other Clinician: Referring  Dahmir Epperly: Treating Cezar Misiaszek/Extender: Malachi Pro in Treatment: 38 Wound Status Wound Number: 6 Primary Etiology: Pressure Ulcer Wound Location: Right Ischium Wound Status: Open Wounding Event: Trauma Comorbid History: Paraplegia Date Acquired: 02/10/2021 Weeks Of Treatment: 9 Clustered Wound: No Photos Wound Measurements Length: (cm) 7 Width: (cm) 4.5 Depth: (cm) 4.4 Area: (cm) 24.74 Volume: (cm) 108.856 % Reduction in Area: -61.5% % Reduction in Volume: -7005.5% Epithelialization: None Tunneling: Yes Position (o'clock): 12 Maximum Distance: (cm) 8.3 Undermining: No Wound Description Classification: Category/Stage IV Wound Margin: Well defined, not attached Exudate Amount: Large Exudate Type: Purulent Exudate Color: yellow, brown, green Foul Odor After Cleansing: Yes Due to Product Use: Yes Slough/Fibrino Yes Wound Bed Granulation Amount: Large (67-100%) Exposed Structure Granulation Quality: Red, Pink Fascia Exposed: No Necrotic Amount: Small (1-33%) Fat Layer (Subcutaneous Tissue) Exposed: Yes Necrotic Quality: Adherent Slough Tendon Exposed: Yes Muscle Exposed: No Joint Exposed: No Bone Exposed: Yes Treatment Notes Wound #6 (Ischium) Wound Laterality: Right Cleanser Normal Saline Discharge Instruction: Cleanse the wound with Normal Saline prior to applying [name] clean dressing using gauze sponges, not tissue or cotton balls. Peri-Wound Care Topical Primary Dressing Dakin's Solution 0.25%, 16 (oz) Discharge Instruction: Moisten gauze with Dakin's solution and pack lightly into wound Secondary Dressing ABD Pad, 5x9 Discharge Instruction: Apply over primary dressing as directed. Secured With 28M Medipore H Soft Cloth Surgical T 4 x 2 (in/yd) ape Discharge Instruction: Secure dressing with tape as directed. Compression Wrap Compression Stockings Add-Ons Electronic Signature(s) Signed: 04/26/2021 5:24:49 PM By: [name] Ito Signed: 04/26/2021 5:28:21 PM By: Antonieta Iba Previous Signature: 04/22/2021 1:31:18 PM Version By: Antonieta Iba Previous Signature: 04/22/2021 1:55:00 PM Version By: Zenaida Deed RN, BSN Entered By: [name] Ito on 04/26/2021 15:54:02 -------------------------------------------------------------------------------- Wound Assessment Details Patient Name: Date of Service: [name], [name] NIO L. 04/22/2021 9:15 [name] M Medical Record Number: [id number] Patient Account Number: [id number] Date of Birth/Sex: Treating RN: 1991-06-11 (29 y.o. [name] Primary Care Emunah Texidor: PCP, NO Other Clinician: Referring Latausha Flamm: Treating Navdeep Fessenden/Extender: Malachi Pro in Treatment: [phone]  Wound Status Wound Number: 7 Primary Etiology: Pressure Ulcer Wound Location: Sacrum Wound Status: Healed - Epithelialized Wounding Event: Pressure Injury Comorbid History: Paraplegia Date Acquired: 04/06/2021 Weeks Of Treatment: 2 Clustered Wound: No Wound Measurements Length: (cm) Width: (cm) Depth: (cm) Area: (cm) Volume: (cm) 0 % Reduction in Area: 100% 0 % Reduction in Volume: 100% 0 Epithelialization: Large (67-100%) 0 Tunneling: No 0 Undermining: No Wound Description Classification: Category/Stage II Exudate Amount: None Present Foul Odor After Cleansing: No Slough/Fibrino No Wound Bed Granulation Amount: None Present (0%) Exposed Structure Necrotic Amount: None Present (0%) Fascia Exposed: No Fat Layer (Subcutaneous Tissue) Exposed: No Tendon Exposed: No Muscle Exposed: No Joint Exposed: No Bone Exposed: No Electronic Signature(s) Signed: 04/22/2021 1:31:18 PM By: Antonieta IbaBarnhart, Jodi Signed: 04/22/2021 1:55:00 PM By: Zenaida DeedBoehlein, Linda RN, BSN Entered By: Zenaida DeedBoehlein, Linda on 04/22/2021 10:00:34 -------------------------------------------------------------------------------- Vitals Details Patient Name: Date of Service: [name] MarchUSSELL, [name] 04/22/2021  9:15 [name] M Medical Record Number: [id number] Patient Account Number: [id number] Date of Birth/Sex: Treating RN: 11/12/1991 (29 y.o. Lytle MichaelsM) Barnhart, Jodi Primary Care Devani Odonnel: PCP, NO Other Clinician: Referring Lindzee Gouge: Treating Eldon Zietlow/Extender: Malachi ProHoffman, Jessica [name], [name] Weeks in Treatment: 38 Vital Signs Time Taken: 09:42 Temperature (F): 98.5 Height (in): 71 Pulse (bpm): 113 Weight (lbs): 136 Respiratory Rate (breaths/min): 18 Body Mass Index (BMI): 19 Blood Pressure (mmHg): 125/67 Reference Range: 80 - 120 mg / dl Electronic Signature(s) Signed: 04/26/2021 5:24:49 PM By: [name] Itoawkins, Destiny Entered By: [name] Itoawkins, Destiny on 04/22/2021 09:43:10

## 2021-04-27 ENCOUNTER — Other Ambulatory Visit: Payer: Self-pay

## 2021-04-27 ENCOUNTER — Ambulatory Visit: Payer: Medicaid Other | Admitting: Internal Medicine

## 2021-04-27 ENCOUNTER — Emergency Department (HOSPITAL_COMMUNITY)
Admission: EM | Admit: 2021-04-27 | Discharge: 2021-04-27 | Disposition: A | Discharge status: Home / Self Care | Payer: Medicaid Other | Attending: Emergency Medicine | Admitting: Emergency Medicine

## 2021-04-27 ENCOUNTER — Encounter (HOSPITAL_COMMUNITY): Payer: Self-pay | Admitting: Emergency Medicine

## 2021-04-27 DIAGNOSIS — R000 Tachycardia, unspecified: Secondary | ICD-10-CM | POA: Diagnosis not present

## 2021-04-27 DIAGNOSIS — F17210 Nicotine dependence, cigarettes, uncomplicated: Secondary | ICD-10-CM | POA: Diagnosis not present

## 2021-04-27 DIAGNOSIS — N319 Neuromuscular dysfunction of bladder, unspecified: Secondary | ICD-10-CM | POA: Insufficient documentation

## 2021-04-27 DIAGNOSIS — L89314 Pressure ulcer of right buttock, stage 4: Secondary | ICD-10-CM | POA: Diagnosis not present

## 2021-04-27 LAB — CBC WITH DIFFERENTIAL/PLATELET
Abs Immature Granulocytes: 0.02 10*3/uL (ref 0.00–0.07)
Basophils Absolute: 0 10*3/uL (ref 0.0–0.1)
Basophils Relative: 0 %
Eosinophils Absolute: 0.1 10*3/uL (ref 0.0–0.5)
Eosinophils Relative: 1 %
HCT: 34.1 % — ABNORMAL LOW (ref 39.0–52.0)
Hemoglobin: 9.8 g/dL — ABNORMAL LOW (ref 13.0–17.0)
Immature Granulocytes: 0 %
Lymphocytes Relative: 22 %
Lymphs Abs: 1.6 10*3/uL (ref 0.7–4.0)
MCH: 21.5 pg — ABNORMAL LOW (ref 26.0–34.0)
MCHC: 28.7 g/dL — ABNORMAL LOW (ref 30.0–36.0)
MCV: 74.9 fL — ABNORMAL LOW (ref 80.0–100.0)
Monocytes Absolute: 0.5 10*3/uL (ref 0.1–1.0)
Monocytes Relative: 7 %
Neutro Abs: 5.2 10*3/uL (ref 1.7–7.7)
Neutrophils Relative %: 70 %
Platelets: 873 10*3/uL — ABNORMAL HIGH (ref 150–400)
RBC: 4.55 MIL/uL (ref 4.22–5.81)
RDW: 15.2 % (ref 11.5–15.5)
WBC: 7.3 10*3/uL (ref 4.0–10.5)
nRBC: 0 % (ref 0.0–0.2)

## 2021-04-27 LAB — BASIC METABOLIC PANEL
Anion gap: 9 (ref 5–15)
BUN: 11 mg/dL (ref 6–20)
CO2: 29 mmol/L (ref 22–32)
Calcium: 9 mg/dL (ref 8.9–10.3)
Chloride: 104 mmol/L (ref 98–111)
Creatinine, Ser: 0.7 mg/dL (ref 0.61–1.24)
GFR, Estimated: >60 mL/min (ref >60)
Glucose, Bld: 124 mg/dL — ABNORMAL HIGH (ref 70–99)
Potassium: 3.6 mmol/L (ref 3.5–5.1)
Sodium: 142 mmol/L (ref 135–145)

## 2021-04-27 LAB — URINALYSIS, MICROSCOPIC (REFLEX)
RBC / HPF: NONE SEEN RBC/hpf (ref 0–5)
Squamous Epithelial / HPF: NONE SEEN (ref 0–5)

## 2021-04-27 LAB — URINALYSIS, ROUTINE W REFLEX MICROSCOPIC
Bilirubin Urine: NEGATIVE
Glucose, UA: NEGATIVE mg/dL
Hgb urine dipstick: NEGATIVE
Ketones, ur: NEGATIVE mg/dL
Leukocytes,Ua: NEGATIVE
Nitrite: NEGATIVE
Protein, ur: NEGATIVE mg/dL
Specific Gravity, Urine: 1.025 (ref 1.005–1.030)
pH: 7 (ref 5.0–8.0)

## 2021-04-27 LAB — LACTIC ACID, PLASMA: Lactic Acid, Venous: 0.7 mmol/L (ref 0.5–1.9)

## 2021-04-27 MED ORDER — SODIUM CHLORIDE 0.9 % IV BOLUS
1000.0000 mL | Freq: Once | INTRAVENOUS | Status: AC
  Administered 2021-04-27: 1000 mL via INTRAVENOUS

## 2021-04-27 MED ORDER — BACLOFEN 20 MG PO TABS
40.0000 mg | ORAL_TABLET | Freq: Once | ORAL | Status: AC
  Administered 2021-04-27: 40 mg via ORAL
  Filled 2021-04-27: qty 2

## 2021-04-27 MED ORDER — OXYCODONE HCL 5 MG PO TABS
10.0000 mg | ORAL_TABLET | Freq: Once | ORAL | Status: AC
  Administered 2021-04-27: 10 mg via ORAL
  Filled 2021-04-27: qty 2

## 2021-04-27 MED ORDER — TRAMADOL HCL 50 MG PO TABS
50.0000 mg | ORAL_TABLET | Freq: Once | ORAL | Status: AC
Start: 2021-04-27 — End: 2021-04-27
  Administered 2021-04-27: 50 mg via ORAL
  Filled 2021-04-27: qty 1
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
## 2021-04-27 NOTE — Progress Notes (Signed)
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 evaluation today patient appears to be doing well with regard to his wound. He has been tolerating the dressing changes without complication both in regard to the sacral region and the ankle although he has not gotten the Santyl we really need to see about getting that as soon as possible. He did receive a call from the pharmacy she just did not get the prescription at that point. 01/19/2021 upon evaluation today patient appears to be doing well with regard to his wounds currently. Both appear to be fairly clean. Fortunately there is no signs of active infection at this time. No fevers, chills, nausea, vomiting, or diarrhea. 02/16/2021 upon evaluation today patient appears to be doing decently well in regard to the sacral wound as well as his ankle wound. Both are showing signs of significant improvement which is great news and I am pleased in that regard. There does not appear to be any evidence of infection which is also excellent news. Unfortunately he  has a right ischial ulcer which is new and unfortunately I think this is also unstageable which means we are unsure how deep this is really the end up being. Obviously I think this is a big deal. 4/19;  patient presents for evaluation of his right ischial ulcer and right ankle wound. He has been using Santyl to the right ischial ulcer and collagen to the ankle wound. He reports no issues today. 03/30/2021 upon evaluation today patient appears to be doing worse in regard to his wound in the right ischial location. Fortunately there does not appear to be any signs of active infection at this time which is great news systemically though locally I feel like this likely is infected. I am can obtain a culture today to see what shows so we can adjust and treat him appropriately. With that being said the ankle appears to be doing okay and there was a gluteal region on the right that was in question but I do not see anything that is actually open here. 04/06/2021 upon evaluation today patient appears to be doing poorly in regard to his wound. He is unfortunately showing signs of significant infection in my opinion. This is the right ischial location. He does actually have necrotic bone noted in the base of the wound unfortunately. This also has a significant odor at this point. I think that coupled with the fevers been having as high as 102 although he is 100.3 right now he feels hot to touch all over. I do believe that this is likely osteomyelitis and I believe that he really needs to be treated aggressively at this point I would recommend that he needs to go to the ER for possible and likely hospital admission. I think IV antibiotics are going to be a necessary at this time. 5/27; patient was last seen 2 weeks ago. He was sent to the ED for decline in his wound and likelihood of osteomyelitis. He states he went and it was all taken care of. He states he is here only for debridement. He denies signs of infection.  He overall feels well Patient History Information obtained from Patient. Family History Hypertension - Mother, No family history of Cancer, Diabetes, Heart Disease, Hereditary Spherocytosis, Kidney Disease, Lung Disease, Seizures, Stroke, Thyroid Problems, Tuberculosis. Social History Current every day smoker, Marital Status - Single, Alcohol Use - Never, Drug Use - Current History - pot, Caffeine Use - Rarely. Medical History Eyes Denies history of Cataracts, Glaucoma, Optic Neuritis Ear/Nose/Mouth/Throat Denies history of Chronic sinus problems/congestion, Middle ear problems Hematologic/Lymphatic Denies history of Anemia, Hemophilia, Human Immunodeficiency Virus, Lymphedema, Sickle Cell Disease Respiratory Denies history of Aspiration, Asthma, Chronic Obstructive Pulmonary Disease (COPD), Pneumothorax, Sleep Apnea, Tuberculosis Cardiovascular Denies history of Angina, Arrhythmia, Congestive Heart Failure, Coronary Artery Disease, Deep Vein Thrombosis, Hypertension, Hypotension, Myocardial Infarction, Peripheral Arterial Disease, Peripheral Venous Disease, Phlebitis, Vasculitis Gastrointestinal Denies history of Cirrhosis , Colitis, Crohnoos, Hepatitis A, Hepatitis B, Hepatitis C Endocrine Denies history of Type I Diabetes, Type II Diabetes Genitourinary Denies history of End Stage Renal Disease Immunological Denies history of Lupus Erythematosus, Raynaudoos, Scleroderma Integumentary (Skin) Denies history of History of Burn Musculoskeletal Denies history of Gout, Rheumatoid Arthritis, Osteoarthritis, Osteomyelitis Neurologic Patient has history of Paraplegia Denies history of Dementia, Neuropathy, Quadriplegia, Seizure Disorder Oncologic Denies history of Received Chemotherapy, Received Radiation Psychiatric Denies history of Anorexia/bulimia, Confinement Anxiety Objective Constitutional respirations regular, non-labored and within target range for patient.. Vitals  Time Taken: 9:42 AM, Height: 71 in, Weight: 136 lbs, BMI: 19, Temperature: 98.5 F, Pulse: 113 bpm, Respiratory Rate: 18 breaths/min, Blood Pressure: 125/67 mmHg. Psychiatric pleasant and cooperative. General Notes: Right ischial wound: Granulation tissue overlying bone. Necrotic tissue noted and partially debrided. No obvious signs of  soft tissue infection. Integumentary (Hair, Skin) Wound #3 status is Open. Original cause of wound was Gradually Appeared. The date acquired was: 06/27/2020. The wound has been in treatment 38 weeks. The wound is located on the Left,Lateral Malleolus. The wound measures 0.1cm length x 0.1cm width x 0.1cm depth; 0.008cm^2 area and 0.001cm^3 volume. There is no tunneling or undermining noted. There is a none present amount of drainage noted. The wound margin is flat and intact. There is no granulation within the wound bed. There is no necrotic tissue within the wound bed. General Notes: scabbed Wound #6 status is Open. Original cause of wound was Trauma. The date acquired was: 02/10/2021. The wound has been in treatment 9 weeks. The wound is located on the Right Ischium. The wound measures 7cm length x 4.5cm width x 4.4cm depth; 24.74cm^2 area and 108.856cm^3 volume. There is bone, tendon, and Fat Layer (Subcutaneous Tissue) exposed. There is no undermining noted, however, there is tunneling at 12:00 with a maximum distance of 8.3cm. There is a large amount of purulent drainage noted. Foul odor after cleansing was noted. Foul odor after cleansing was noted due to product use. The wound margin is well defined and not attached to the wound base. There is large (67-100%) red, pink granulation within the wound bed. There is a small (1-33%) amount of necrotic tissue within the wound bed including Adherent Slough. Wound #7 status is Healed - Epithelialized. Original cause of wound was Pressure Injury. The date acquired was: 04/06/2021. The wound has been in treatment 2 weeks.  The wound is located on the Sacrum. The wound measures 0cm length x 0cm width x 0cm depth; 0cm^2 area and 0cm^3 volume. There is no tunneling or undermining noted. There is a none present amount of drainage noted. There is no granulation within the wound bed. There is no necrotic tissue within the wound bed. Assessment Active Problems ICD-10 Pressure ulcer of sacral region, stage 4 Pressure ulcer of left ankle, stage 3 Pressure ulcer of right buttock, unstageable Unspecified severe protein-calorie malnutrition Paraplegia, complete Other chronic osteomyelitis, other site This is a patient of [facility]. Patient was last seen on 5/11 and was recommended to go to the ED due to probable osteomyelitis. He was evaluated on 5/13 in the ED and admitted however left AGAINST MEDICAL ADVICE. He was given a doxycycline and Keflex before he left and to continue outpatient. I went over the imaging results with him of the CT abdomen pelvis without contrast. I told him specifically that there was osteomyelitis to his right ischium. He told me he did not know that there was an infection in the bone. I recommended he see infectious disease. He states he overall feels well. He states he is only here for debridement and will follow-up with [facility] next week. He is agreeable to see Infectious Disease after much discussion. I told him that if he were to feel unwell he would need to go straight to the ED. in fact he was just in the ED for urinary retention. It was recommended that he stay for possible admission however he declined. He knows to call us with any questions or concerns. Procedures Wound #6 Pre-procedure diagnosis of Wound #6 is a Pressure Ulcer located on the Right Ischium . There was a Selective/Open Wound Non-Viable Tissue Debridement with a total area of 20 sq cm performed by Geralyn Corwin, DO. With the following instrument(s): Forceps, and Scissors to remove Non-Viable tissue/material. Material removed  includes Eschar and Slough and. No  specimens were taken. A time out was conducted at 10:40, prior to the start of the procedure. A Minimum amount of bleeding was controlled with Pressure. The procedure was tolerated well. Post Debridement Measurements: 7cm length x 4.5cm width x 4.4cm depth; 108.856cm^3 volume. Post debridement Stage noted as Category/Stage IV. Character of Wound/Ulcer Post Debridement is stable. Post procedure Diagnosis Wound #6: Same as Pre-Procedure Plan Follow-up Appointments: Return Appointment in 1 week. - or call to schedule once discharged from the hospital Other: - Go to emergency room for evaluation and treatment of right ischial wound infection Bathing/ Shower/ Hygiene: May shower with protection but do not get wound dressing(s) wet. Off-Loading: Turn and reposition every 2 hours - be sure to lift up off the chair with arms every hour while in wheelchair Other: - pillows under left calf to keep pressure of left lateral ankle Non Wound Condition: Protect area with: - Protect sacrum with vaseline or zinc oxide The following medication(s) was prescribed: Dakin's Solution miscellaneous 0.25 % solution 1 wet to dry dressings daily as needed starting 04/22/2021 WOUND #3: - Malleolus Wound Laterality: Left, Lateral Cleanser: Normal Saline Every Other Day/30 Days Discharge Instructions: Cleanse the wound with Normal Saline prior to applying a clean dressing using gauze sponges, not tissue or cotton balls. Prim Dressing: Promogran Prisma Matrix, 4.34 (sq in) (silver collagen) Every Other Day/30 Days ary Discharge Instructions: Moisten collagen with saline or hydrogel Secondary Dressing: ComfortFoam Border, 4x4 in (silicone border) Every Other Day/30 Days Discharge Instructions: Apply over primary dressing as directed. WOUND #6: - Ischium Wound Laterality: Right Cleanser: Normal Saline 1 x Per Day/30 Days Discharge Instructions: Cleanse the wound with Normal Saline prior  to applying a clean dressing using gauze sponges, not tissue or cotton balls. Prim Dressing: Dakin's Solution 0.25%, 16 (oz) 1 x Per Day/30 Days ary Discharge Instructions: Moisten gauze with Dakin's solution and pack lightly into wound Secondary Dressing: ABD Pad, 5x9 1 x Per Day/30 Days Discharge Instructions: Apply over primary dressing as directed. Secured With: 17M Medipore H Soft Cloth Surgical T 4 x 2 (in/yd) 1 x Per Day/30 Days ape Discharge Instructions: Secure dressing with tape as directed. 1. Infectious disease referral 2. Follow-up next week 3. In office sharp debridement Electronic Signature(s) Signed: 04/22/2021 10:58:30 AM By: Geralyn Corwin DO Entered By: Geralyn Corwin on 04/22/2021 10:57:55 -------------------------------------------------------------------------------- HxROS Details Patient Name: Date of Service: [name] March L. 04/22/2021 9:15 A M Medical Record Number: [id number] Patient Account Number: [id number] Date of Birth/Sex: Treating RN: 12/12/1990 (29 y.o. [name] Primary Care Provider: PCP, NO Other Clinician: Referring Provider: Treating Provider/Extender: Malachi Pro in Treatment: 38 Information Obtained From Patient Eyes Medical History: Negative for: Cataracts; Glaucoma; Optic Neuritis Ear/Nose/Mouth/Throat Medical History: Negative for: Chronic sinus problems/congestion; Middle ear problems Hematologic/Lymphatic Medical History: Negative for: Anemia; Hemophilia; Human Immunodeficiency Virus; Lymphedema; Sickle Cell Disease Respiratory Medical History: Negative for: Aspiration; Asthma; Chronic Obstructive Pulmonary Disease (COPD); Pneumothorax; Sleep Apnea; Tuberculosis Cardiovascular Medical History: Negative for: Angina; Arrhythmia; Congestive Heart Failure; Coronary Artery Disease; Deep Vein Thrombosis; Hypertension; Hypotension; Myocardial Infarction; Peripheral Arterial Disease; Peripheral  Venous Disease; Phlebitis; Vasculitis Gastrointestinal Medical History: Negative for: Cirrhosis ; Colitis; Crohns; Hepatitis A; Hepatitis B; Hepatitis C Endocrine Medical History: Negative for: Type I Diabetes; Type II Diabetes Genitourinary Medical History: Negative for: End Stage Renal Disease Immunological Medical History: Negative for: Lupus Erythematosus; Raynauds; Scleroderma Integumentary (Skin) Medical History: Negative for: History of Burn Musculoskeletal Medical History: Negative for: Gout; Rheumatoid  Arthritis; Osteoarthritis; Osteomyelitis Neurologic Medical History: Positive for: Paraplegia Negative for: Dementia; Neuropathy; Quadriplegia; Seizure Disorder Oncologic Medical History: Negative for: Received Chemotherapy; Received Radiation Psychiatric Medical History: Negative for: Anorexia/bulimia; Confinement Anxiety Immunizations Pneumococcal Vaccine: Received Pneumococcal Vaccination: No Implantable Devices None Family and Social History Cancer: No; Diabetes: No; Heart Disease: No; Hereditary Spherocytosis: No; Hypertension: Yes - Mother; Kidney Disease: No; Lung Disease: No; Seizures: No; Stroke: No; Thyroid Problems: No; Tuberculosis: No; Current every day smoker; Marital Status - Single; Alcohol Use: Never; Drug Use: Current History - pot; Caffeine Use: Rarely; Financial Concerns: No; Food, Clothing or Shelter Needs: No; Support System Lacking: No; Transportation Concerns: No Electronic Signature(s) Signed: 04/22/2021 10:58:30 AM By: Geralyn Corwin DO Signed: 04/22/2021 1:31:18 PM By: Antonieta Iba Entered By: Geralyn Corwin on 04/22/2021 10:47:15 -------------------------------------------------------------------------------- SuperBill Details Patient Name: Date of Service: [name] 04/22/2021 Medical Record Number: [id number] Patient Account Number: [id number] Date of Birth/Sex: Treating RN: [birth year] (29 y.o. [name] Primary Care Provider: PCP, NO Other Clinician: Referring Provider: Treating Provider/Extender: Malachi Pro in Treatment: 38 Diagnosis Coding ICD-10 Codes Code Description L89.154 Pressure ulcer of sacral region, stage 4 L89.523 Pressure ulcer of left ankle, stage 3 L89.310 Pressure ulcer of right buttock, unstageable E43 Unspecified severe protein-calorie malnutrition G82.21 Paraplegia, complete M86.68 Other chronic osteomyelitis, other site Facility Procedures CPT4 Code: [id number] Description: 97597 - DEBRIDE WOUND 1ST 20 SQ CM OR < ICD-10 Diagnosis Description L89.310 Pressure ulcer of right buttock, unstageable Modifier: Quantity: 1 Physician Procedures Electronic Signature(s) Signed: 04/22/2021 10:58:30 AM By: Geralyn Corwin DO Entered By: Geralyn Corwin on 04/22/2021 10:58:10
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
## 2021-04-27 NOTE — ED Notes (Signed)
Pt verbalized understanding of discharge paperwork and follow-up care.  °

## 2021-04-27 NOTE — ED Triage Notes (Signed)
Pt endorses stage 4 pressure ulcer to sacral area that is having pain. Pt is paraplegic and reports decreased urine output for the last month. Pt was admitted last week for same and had foley.

## 2021-04-27 NOTE — Discharge Instructions (Signed)
Please read and follow all provided instructions.  Your diagnoses today include:  1. Decubitus ulcer of ischial area, right, stage IV (HCC)   2. Neurogenic bladder     Tests performed today include: Blood cell counts (white, red, and platelets) -infection fighting cell count is normal, mild anemia which is stable Electrolytes -slightly high blood sugar otherwise normal Kidney function test -normal kidney function Urine test to check for infection -urine culture sent  Lactic acid -blood tests that does not indicate severe infection today   vital signs. See below for your results today.   Medications prescribed:   None  Take any prescribed medications only as directed.  Home care instructions:  Follow any educational materials contained in this packet.  Follow-up instructions: Please follow-up with your primary care provider in the next 3 days for further evaluation of your symptoms.   Return instructions:   Please return to the Emergency Department if you experience worsening symptoms.   Follow-up with your doctor or return to the emergency department with fever, vomiting, new drainage from your wound  Please return if you have any other emergent concerns.  Additional Information:  Your vital signs today were: BP (!) 126/91   Pulse 87   Temp 99.1 F (37.3 C) (Rectal)   Resp 13   Ht 5\' 11"  (1.803 m)   Wt 58 kg   SpO2 100%   BMI 17.83 kg/m  If your blood pressure (BP) was elevated above 135/85 this visit, please have this repeated by your doctor within one month. --------------

## 2021-04-28 LAB — URINE CULTURE

## 2021-05-02 ENCOUNTER — Telehealth: Payer: Self-pay | Admitting: Physical Medicine and Rehabilitation

## 2021-05-02 MED ORDER — OXYCODONE HCL 10 MG PO TABS: 10.0000 mg | ORAL_TABLET | Freq: Four times a day (QID) | ORAL | 0 refills | Status: DC | PRN

## 2021-05-02 NOTE — Telephone Encounter (Signed)
Last oxy was filled 5/6- is due- sent in Oxy 5 mg #75 pills- already have sent Rx for Tramadol- he has that- can pick up in the next week.

## 2021-05-02 NOTE — Addendum Note (Signed)
Addended by: Genice Rouge on: 05/02/2021 09:21 AM   Modules accepted: Orders

## 2021-05-04 ENCOUNTER — Other Ambulatory Visit (HOSPITAL_COMMUNITY)
Admission: RE | Admit: 2021-05-04 | Discharge: 2021-05-04 | Disposition: A | Discharge status: Home / Self Care | Payer: Medicaid Other | Attending: *Deleted | Admitting: *Deleted

## 2021-05-04 ENCOUNTER — Encounter (HOSPITAL_BASED_OUTPATIENT_CLINIC_OR_DEPARTMENT_OTHER): Payer: Medicaid Other | Attending: Physician Assistant | Admitting: Physician Assistant

## 2021-05-04 ENCOUNTER — Other Ambulatory Visit: Payer: Self-pay

## 2021-05-04 ENCOUNTER — Telehealth: Payer: Self-pay | Admitting: *Deleted

## 2021-05-04 DIAGNOSIS — L89523 Pressure ulcer of left ankle, stage 3: Secondary | ICD-10-CM | POA: Diagnosis not present

## 2021-05-04 DIAGNOSIS — M869 Osteomyelitis, unspecified: Secondary | ICD-10-CM | POA: Insufficient documentation

## 2021-05-04 DIAGNOSIS — F17200 Nicotine dependence, unspecified, uncomplicated: Secondary | ICD-10-CM | POA: Diagnosis not present

## 2021-05-04 DIAGNOSIS — L89154 Pressure ulcer of sacral region, stage 4: Secondary | ICD-10-CM | POA: Insufficient documentation

## 2021-05-04 DIAGNOSIS — Z681 Body mass index (BMI) 19 or less, adult: Secondary | ICD-10-CM | POA: Diagnosis not present

## 2021-05-04 DIAGNOSIS — M4628 Osteomyelitis of vertebra, sacral and sacrococcygeal region: Secondary | ICD-10-CM | POA: Insufficient documentation

## 2021-05-04 DIAGNOSIS — E43 Unspecified severe protein-calorie malnutrition: Secondary | ICD-10-CM | POA: Diagnosis not present

## 2021-05-04 DIAGNOSIS — G8221 Paraplegia, complete: Secondary | ICD-10-CM | POA: Insufficient documentation

## 2021-05-04 DIAGNOSIS — Z7901 Long term (current) use of anticoagulants: Secondary | ICD-10-CM | POA: Insufficient documentation

## 2021-05-04 DIAGNOSIS — L89310 Pressure ulcer of right buttock, unstageable: Secondary | ICD-10-CM | POA: Diagnosis not present

## 2021-05-04 NOTE — Progress Notes (Signed)
LEIBISH, MCGREGOR ([id number]) Visit Report for 05/04/2021 Arrival Information Details Patient Name: Date of Service: [name], [name] 05/04/2021 12:45 PM Medical Record Number: [id number] Patient Account Number: [id number] Date of Birth/Sex: Treating RN: Dec 07, 1990 (29 y.o. [name], [name] Primary Care Desare Duddy: PCP, NO Other Clinician: Referring Lyberti Thrush: Treating Isebella Upshur/Extender: [name] in Treatment: 39 Visit Information History Since Last Visit Added or deleted any medications: No Patient Arrived: Wheel Chair Any new allergies or adverse reactions: No Arrival Time: 13:10 Had a fall or experienced change in No Accompanied By: self activities of daily living that may affect Transfer Assistance: Manual risk of falls: Patient Identification Verified: Yes Signs or symptoms of abuse/neglect since last visito No Secondary Verification Process Completed: Yes Hospitalized since last visit: No Patient Requires Transmission-Based Precautions: No Implantable device outside of the clinic excluding No Patient Has Alerts: No cellular tissue based products placed in the center since last visit: Has Dressing in Place as Prescribed: Yes Pain Present Now: No Electronic Signature(s) Signed: 05/04/2021 6:05:11 PM By: Shawn Stall Entered By: Shawn Stall on 05/04/2021 13:22:04 -------------------------------------------------------------------------------- Encounter Discharge Information Details Patient Name: Date of Service: [name] March L. 05/04/2021 12:45 PM Medical Record Number: [id number] Patient Account Number: [id number] Date of Birth/Sex: Treating RN: 1991-02-27 (30 y.o. [name] Primary Care Toneshia Coello: PCP, NO Other Clinician: Referring Teja Costen: Treating Gerald Kuehl/Extender: [name] in Treatment: 39 Encounter Discharge Information Items Post Procedure Vitals Discharge Condition: Stable Temperature  (F): 97.4 Ambulatory Status: Wheelchair Pulse (bpm): 74 Discharge Destination: Home Respiratory Rate (breaths/min): 17 Transportation: Private Auto Blood Pressure (mmHg): 117/89 Accompanied By: self Schedule Follow-up Appointment: Yes Clinical Summary of Care: Patient Declined Electronic Signature(s) Signed: 05/04/2021 6:04:38 PM By: Fonnie Mu RN Entered By: Fonnie Mu on 05/04/2021 16:13:46 -------------------------------------------------------------------------------- Lower Extremity Assessment Details Patient Name: Date of Service: [name] Regulus. 05/04/2021 12:45 PM Medical Record Number: [id number] Patient Account Number: [id number] Date of Birth/Sex: Treating RN: 27-Sep-1991 (30 y.o. [name] Primary Care Caylor Tallarico: PCP, NO Other Clinician: Referring Neeka Urista: Treating Thurmond Hildebran/Extender: Angus Palms Weeks in Treatment: 39 Electronic Signature(s) Signed: 05/04/2021 6:05:11 PM By: Shawn Stall Entered By: Shawn Stall on 05/04/2021 13:22:32 -------------------------------------------------------------------------------- Multi-Disciplinary Care Plan Details Patient Name: Date of Service: [name], [name] 05/04/2021 12:45 PM Medical Record Number: [id number] Patient Account Number: [id number] Date of Birth/Sex: Treating RN: Jun 08, 1991 (30 y.o. [name] Primary Care Nathanel Tallman: PCP, NO Other Clinician: Referring Cobe Viney: Treating Zaria Taha/Extender: [name] in Treatment: 39 Multidisciplinary Care Plan reviewed with physician Active Inactive Pressure Nursing Diagnoses: Knowledge deficit related to causes and risk factors for pressure ulcer development Goals: Patient/caregiver will verbalize risk factors for pressure ulcer development Date Initiated: 07/30/2020 Target Resolution Date: 05/25/2021 Goal Status: Active Interventions: Provide education on pressure ulcers Notes: Wound/Skin  Impairment Nursing Diagnoses: Impaired tissue integrity Goals: Patient/caregiver will verbalize understanding of skin care regimen Date Initiated: 01/05/2021 Target Resolution Date: 05/25/2021 Goal Status: Active Ulcer/skin breakdown will have a volume reduction of 50% by week 8 Date Initiated: 07/30/2020 Date Inactivated: 10/20/2020 Target Resolution Date: 09/27/2020 Goal Status: Unmet Unmet Reason: Infection Ulcer/skin breakdown will have a volume reduction of 80% by week 12 Date Initiated: 10/20/2020 Date Inactivated: 12/22/2020 Target Resolution Date: 11/19/2020 Unmet Reason: paraplegic, difficulty Goal Status: Unmet offloading Interventions: Provide education on ulcer and skin care Notes: Electronic Signature(s) Signed: 05/04/2021 6:12:56 PM By: Zenaida Deed RN, BSN Entered By: Daneil Dan,  Linda on 05/04/2021 15:52:16 -------------------------------------------------------------------------------- Pain Assessment Details Patient Name: Date of Service: [name], [name] 05/04/2021 12:45 PM Medical Record Number: [id number] Patient Account Number: [id number] Date of Birth/Sex: Treating RN: October 22, 1991 (30 y.o. [name] Primary Care Rowyn Mustapha: PCP, NO Other Clinician: Referring Emireth Cockerham: Treating Leovardo Thoman/Extender: [name] in Treatment: 39 Active Problems Location of Pain Severity and Description of Pain Patient Has Paino No Site Locations Rate the pain. Current Pain Level: 0 Pain Management and Medication Current Pain Management: Medication: No Cold Application: No Rest: No Massage: No Activity: No T.E.N.S.: No Heat Application: No Leg drop or elevation: No Is the Current Pain Management Adequate: Adequate How does your wound impact your activities of daily livingo Sleep: No Bathing: No Appetite: No Relationship With Others: No Bladder Continence: No Emotions: No Bowel Continence: No Work: No Toileting: No Drive:  No Dressing: No Hobbies: No Electronic Signature(s) Signed: 05/04/2021 6:05:11 PM By: Shawn Stall Entered By: Shawn Stall on 05/04/2021 13:22:27 -------------------------------------------------------------------------------- Patient/Caregiver Education Details Patient Name: Date of Service: [name] Regulus 6/8/2022andnbsp12:45 PM Medical Record Number: [id number] Patient Account Number: [id number] Date of Birth/Gender: Treating RN: Oct 12, 1991 (29 y.o. [name] Primary Care Physician: PCP, NO Other Clinician: Referring Physician: Treating Physician/Extender: [name] in Treatment: 39 Education Assessment Education Provided To: Patient Education Topics Provided Infection: Methods: Explain/Verbal Responses: Reinforcements needed, State content correctly Pressure: Methods: Explain/Verbal Responses: Reinforcements needed, State content correctly Wound/Skin Impairment: Methods: Explain/Verbal Responses: Reinforcements needed, State content correctly Electronic Signature(s) Signed: 05/04/2021 6:12:56 PM By: Zenaida Deed RN, BSN Entered By: Zenaida Deed on 05/04/2021 15:52:44 -------------------------------------------------------------------------------- Wound Assessment Details Patient Name: Date of Service: [name] March L. 05/04/2021 12:45 PM Medical Record Number: [id number] Patient Account Number: [id number] Date of Birth/Sex: Treating RN: 1990-12-08 (29 y.o. [name] Primary Care Amay Mijangos: PCP, NO Other Clinician: Referring Pearla Mckinny: Treating Lessly Stigler/Extender: Angus Palms Weeks in Treatment: 39 Wound Status Wound Number: 6 Primary Etiology: Pressure Ulcer Wound Location: Right Ischium Wound Status: Open Wounding Event: Trauma Comorbid History: Paraplegia Date Acquired: 02/10/2021 Weeks Of Treatment: 11 Clustered Wound: No Photos Wound Measurements Length: (cm) 6.5 Width: (cm)  4.2 Depth: (cm) 4 Area: (cm) 21.441 Volume: (cm) 85.765 % Reduction in Area: -40% % Reduction in Volume: -5498.2% Epithelialization: None Tunneling: Yes Position (o'clock): 12 Maximum Distance: (cm) 8.2 Undermining: Yes Location 1 Starting Position (o'clock): 12 Ending Position (o'clock): 3 Maximum Distance: (cm) 4.5 Location 2 Starting Position (o'clock): 4 Ending Position (o'clock): 7 Maximum Distance: (cm) 3 Wound Description Classification: Category/Stage IV Wound Margin: Well defined, not attached Exudate Amount: Large Exudate Type: Purulent Exudate Color: yellow, brown, green Foul Odor After Cleansing: No Slough/Fibrino Yes Wound Bed Granulation Amount: Large (67-100%) Exposed Structure Granulation Quality: Red, Pink Fascia Exposed: No Necrotic Amount: Small (1-33%) Fat Layer (Subcutaneous Tissue) Exposed: Yes Necrotic Quality: Adherent Slough Tendon Exposed: Yes Muscle Exposed: No Joint Exposed: No Bone Exposed: Yes Treatment Notes Wound #6 (Ischium) Wound Laterality: Right Cleanser Normal Saline Discharge Instruction: Cleanse the wound with Normal Saline prior to applying a clean dressing using gauze sponges, not tissue or cotton balls. Peri-Wound Care Topical Primary Dressing Dakin's Solution 0.25%, 16 (oz) Discharge Instruction: Moisten gauze with Dakin's solution and pack lightly into wound Secondary Dressing ABD Pad, 5x9 Discharge Instruction: Apply over primary dressing as directed. Secured With 49M Medipore H Soft Cloth Surgical T 4 x 2 (in/yd) ape Discharge Instruction: Secure dressing with tape as  directed. Compression Wrap Compression Stockings Add-Ons Electronic Signature(s) Signed: 05/04/2021 5:19:11 PM By: [name] Ito Signed: 05/04/2021 6:05:11 PM By: Shawn Stall Entered By: [name] Ito on 05/04/2021 17:15:22 -------------------------------------------------------------------------------- Vitals Details Patient Name: Date  of Service: [name] March L. 05/04/2021 12:45 PM Medical Record Number: [id number] Patient Account Number: [id number] Date of Birth/Sex: Treating RN: 1991-07-02 (29 y.o. [name] Primary Care Jaiel Saraceno: PCP, NO Other Clinician: Referring Binta Statzer: Treating Ibn Stief/Extender: [name] in Treatment: 39 Vital Signs Time Taken: 13:11 Temperature (F): 98.7 Height (in): 71 Pulse (bpm): 96 Weight (lbs): 136 Respiratory Rate (breaths/min): 20 Body Mass Index (BMI): 19 Blood Pressure (mmHg): 148/97 Reference Range: 80 - 120 mg / dl Electronic Signature(s) Signed: 05/04/2021 6:05:11 PM By: Shawn Stall Entered By: Shawn Stall on 05/04/2021 13:22:19

## 2021-05-04 NOTE — Progress Notes (Addendum)
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 infection systemically at this point which is also good news. 11/03/2020 upon evaluation today patient actually appears to be doing excellent in regard to his wounds. He does tell me that he is think about going back to see Dr. Arita Miss to talk about doing the flap surgery for the wound on the sacral region. In regard to the left lateral malleolus this is pretty much about closed as far as I am concerned. Obviously he seems to be doing excellent and I am very pleased with where things stand today. Patient is extremely happy to hear this. No fevers, chills, nausea, vomiting, or diarrhea. 12/22/2020 upon evaluation today patient appears to be doing better in regard to his wounds. With that being said I do not see any signs of infection which is great news. Overall I think that he is making good progress here. I do not even know the skin needed flap in regard to the left sacral region. Nonetheless I do think that he is having some issues  he tells me what he feels like may be a dislocation of his right hip I think he needs to see orthopedics as soon as possible in  that regard. T give him information for that today. o 01/05/2021 upon evaluation today patient appears to be doing well with regard to his wound. He has been tolerating the dressing changes without complication both in regard to the sacral region and the ankle although he has not gotten the Santyl we really need to see about getting that as soon as possible. He did receive a call from the pharmacy she just did not get the prescription at that point. 01/19/2021 upon evaluation today patient appears to be doing well with regard to his wounds currently. Both appear to be fairly clean. Fortunately there is no signs of active infection at this time. No fevers, chills, nausea, vomiting, or diarrhea. 02/16/2021 upon evaluation today patient appears to be doing decently well in regard to the sacral wound as well as his ankle wound. Both are showing signs of significant improvement which is great news and I am pleased in that regard. There does not appear to be any evidence of infection which is also excellent news. Unfortunately he has a right ischial ulcer which is new and unfortunately I think this is also unstageable which means we are unsure how deep this is really the end up being. Obviously I think this is a big deal. 4/19; patient presents for evaluation of his right ischial ulcer and right ankle wound. He has been using Santyl to the right ischial ulcer and collagen to the ankle wound. He reports no issues today. 03/30/2021 upon evaluation today patient appears to be doing worse in regard to his wound in the right ischial location. Fortunately there does not appear to be any signs of active infection at this time which is great news systemically though locally I feel like this likely is infected. I am can obtain a culture today to see what shows so we can adjust and treat him  appropriately. With that being said the ankle appears to be doing okay and there was a gluteal region on the right that was in question but I do not see anything that is actually open here. 04/06/2021 upon evaluation today patient appears to be doing poorly in regard to his wound. He is unfortunately showing signs of significant infection in my opinion. This is the right ischial location. He does actually have necrotic bone noted in the base of the wound unfortunately. This also has a significant odor at this point. I think that coupled with the fevers been having as high as 102 although he is 100.3 right now he feels hot to touch all over. I do believe that this is likely osteomyelitis and I believe that he really needs to be treated aggressively at this point I would recommend that he needs to go to the ER for possible and likely hospital admission. I think IV antibiotics are going to be a necessary at this time. 5/27; patient was last seen 2 weeks ago. He was sent to the ED for decline in his wound and likelihood of osteomyelitis. He states he went and it was all taken care of. He states he is here only for debridement. He denies signs of infection. He overall feels well 05/04/2021 upon evaluation today patient appears to be doing really about the same in regard to the overall size of his wound although it is significantly cleaner compared to what it was previous. There does not appear to be any signs of systemic infection though locally there still is some necrotic tissue I think that the erythema and warmth is  much better. With that being said there is some necrotic bone noted I would like to take a sample of this to send for pathology and culture so that we know how to treat everything here. He does have an infectious disease referral next week on Tuesday. Subsequently that we will give them something to go off of as well as far as figuring out the best treatment course going  forward. Objective Constitutional Well-nourished and well-hydrated in no acute distress. Vitals Time Taken: 1:11 PM, Height: 71 in, Weight: 136 lbs, BMI: 19, Temperature: 98.7 F, Pulse: 96 bpm, Respiratory Rate: 20 breaths/min, Blood Pressure: 148/97 mmHg. Respiratory normal breathing without difficulty. Psychiatric this patient is able to make decisions and demonstrates good insight into disease process. Alert and Oriented x 3. pleasant and cooperative. General Notes: Patient's wound bed actually showed signs of the necrotic tissue noted I did remove some of the necrotic tissue including slough and biofilm as well as necrotic subcutaneous tissue and bone. He tolerated the debridement today without any pain or complication. Post debridement the wound bed appears to be doing much better. We will also be able to send this for pathology as well as culture as far as the bone was concerned. Integumentary (Hair, Skin) Wound #6 status is Open. Original cause of wound was Trauma. The date acquired was: 02/10/2021. The wound has been in treatment 11 weeks. The wound is located on the Right Ischium. The wound measures 6.5cm length x 4.2cm width x 4cm depth; 21.441cm^2 area and 85.765cm^3 volume. There is bone, tendon, and Fat Layer (Subcutaneous Tissue) exposed. Tunneling has been noted at 12:00 with a maximum distance of 8.2cm. Undermining begins at 12:00 and ends at 3:00 with a maximum distance of 4.5cm. There is additional undermining and at 4:00 and ends at 7:00 with a maximum distance of 3cm. There is a large amount of purulent drainage noted. The wound margin is well defined and not attached to the wound base. There is large (67-100%) red, pink granulation within the wound bed. There is a small (1-33%) amount of necrotic tissue within the wound bed including Adherent Slough. Assessment Active Problems ICD-10 Pressure ulcer of sacral region, stage 4 Pressure ulcer of left ankle, stage 3 Pressure  ulcer of right buttock, unstageable Unspecified severe protein-calorie malnutrition Paraplegia, complete Other chronic osteomyelitis, other site Procedures Wound #6 Pre-procedure diagnosis of Wound #6 is a Pressure Ulcer located on the Right Ischium . There was a Excisional Skin/Subcutaneous Tissue/Muscle/Bone Debridement with a total area of 4 sq cm performed by Lenda Kelp, PA. With the following instrument(s): Forceps, Rongeur, and Scissors to remove Viable and Non-Viable tissue/material. Material removed includes Bone,Subcutaneous Tissue, and Slough. 2 specimens were taken by a Tissue Culture and sent to the lab per facility protocol. A time out was conducted at 13:30, prior to the start of the procedure. A Minimum amount of bleeding was controlled with Pressure. The procedure was tolerated well with a pain level of 0 throughout and a pain level of 0 following the procedure. Post Debridement Measurements: 6.5cm length x 4.2cm width x 4cm depth; 85.765cm^3 volume. Post debridement Stage noted as Category/Stage IV. Character of Wound/Ulcer Post Debridement is improved. Post procedure Diagnosis Wound #6: Same as Pre-Procedure Plan Follow-up Appointments: Return Appointment in 1 week. Bathing/ Shower/ Hygiene: May shower with protection but do not get wound dressing(s) wet. Off-Loading: Turn and reposition every 2 hours - be sure to lift up off the chair with arms every hour while in wheelchair  Other: - pillows under left calf to keep pressure of left lateral ankle Non Wound Condition: Protect area with: - Protect sacrum with vaseline or zinc oxide Laboratory ordered were: Anerobic culture right ischium - bone, Biopsy specimen bone - right ischium WOUND #6: - Ischium Wound Laterality: Right Cleanser: Normal Saline 1 x Per Day/30 Days Discharge Instructions: Cleanse the wound with Normal Saline prior to applying a clean dressing using gauze sponges, not tissue or cotton balls. Prim  Dressing: Dakin's Solution 0.25%, 16 (oz) 1 x Per Day/30 Days ary Discharge Instructions: Moisten gauze with Dakin's solution and pack lightly into wound Secondary Dressing: ABD Pad, 5x9 1 x Per Day/30 Days Discharge Instructions: Apply over primary dressing as directed. Secured With: 82M Medipore H Soft Cloth Surgical T 4 x 2 (in/yd) 1 x Per Day/30 Days ape Discharge Instructions: Secure dressing with tape as directed. 1. Would recommend currently that we go ahead and continue with the wound care measures as before and the patient is in agreement with the plan. This includes the Dakin's moistened gauze packed into the wound bed which I think is good at this point. 2. I am also can recommend ABD pad to cover secured with tape. 3. The patient should also continue to appropriately offload including frequent turning in order to allow for this to not worsen or go downhill. We will see patient back for reevaluation in 1 week here in the clinic. If anything worsens or changes patient will contact our office for additional recommendations. Electronic Signature(s) Signed: 05/04/2021 5:40:25 PM By: Lenda Kelp PA-C Signed: 05/04/2021 6:12:56 PM By: Zenaida Deed RN, BSN Previous Signature: 05/04/2021 1:45:12 PM Version By: Lenda Kelp PA-C Entered By: Zenaida Deed on 05/04/2021 15:54:42 -------------------------------------------------------------------------------- SuperBill Details Patient Name: Date of Service: [name] 05/04/2021 Medical Record Number: [id number] Patient Account Number: [id number] Date of Birth/Sex: Treating RN: May 28, 1991 (29 y.o. [name] Primary Care Provider: PCP, NO Other Clinician: Referring Provider: Treating Provider/Extender: [name] in Treatment: 39 Diagnosis Coding ICD-10 Codes Code Description L89.154 Pressure ulcer of sacral region, stage 4 L89.523 Pressure ulcer of left ankle, stage 3 L89.310 Pressure  ulcer of right buttock, unstageable E43 Unspecified severe protein-calorie malnutrition G82.21 Paraplegia, complete M86.68 Other chronic osteomyelitis, other site Facility Procedures Physician Procedures CPT4: Description Modifier Code [id number] - WC PHYS LEVEL 4 - EST PT 25 ICD-10 Diagnosis Description L89.154 Pressure ulcer of sacral region, stage 4 L89.523 Pressure ulcer of left ankle, stage 3 L89.310 Pressure ulcer of right buttock, unstageable  E43 Unspecified severe protein-calorie malnutrition Quantity: 1 CPT4: [id number] Debridement; bone (includes epidermis, dermis, subQ tissue, muscle and/or fascia, if performed) 1st 20 sqcm or less ICD-10 Diagnosis Description L89.154 Pressure ulcer of sacral region, stage 4 Quantity: 1 Electronic Signature(s) Signed: 05/04/2021 1:45:29 PM By: Lenda Kelp PA-C Entered By: Lenda Kelp on 05/04/2021 13:45:28

## 2021-05-04 NOTE — Telephone Encounter (Signed)
Prior authorization submitted to [facility]  For oxycodone 10 mg #75

## 2021-05-05 NOTE — Telephone Encounter (Signed)
Prior authorization for oxycodone APPROVED through 11/01/2021

## 2021-05-10 ENCOUNTER — Other Ambulatory Visit: Payer: Self-pay

## 2021-05-10 ENCOUNTER — Encounter: Payer: Self-pay | Admitting: Infectious Diseases

## 2021-05-10 ENCOUNTER — Ambulatory Visit (INDEPENDENT_AMBULATORY_CARE_PROVIDER_SITE_OTHER): Payer: Medicaid Other | Admitting: Infectious Diseases

## 2021-05-10 ENCOUNTER — Telehealth: Payer: Self-pay

## 2021-05-10 VITALS — BP 110/69 | HR 84 | Temp 98.3°F | Ht 72.0 in

## 2021-05-10 DIAGNOSIS — M869 Osteomyelitis, unspecified: Secondary | ICD-10-CM

## 2021-05-10 LAB — AEROBIC/ANAEROBIC CULTURE W GRAM STAIN (SURGICAL/DEEP WOUND): Gram Stain: NONE SEEN

## 2021-05-10 MED ORDER — DOXYCYCLINE HYCLATE 100 MG PO TABS: 100.0000 mg | ORAL_TABLET | Freq: Two times a day (BID) | ORAL | 1 refills | Status: DC

## 2021-05-10 MED ORDER — METRONIDAZOLE 500 MG PO TABS: 500.0000 mg | ORAL_TABLET | Freq: Two times a day (BID) | ORAL | 1 refills | Status: DC

## 2021-05-10 MED ORDER — LEVOFLOXACIN 750 MG PO TABS: 750.0000 mg | ORAL_TABLET | Freq: Every day | ORAL | 1 refills | Status: DC

## 2021-05-10 NOTE — Telephone Encounter (Signed)
New IV antibiotic orders for daptomycin and cefepime per Dr. Elinor Parkinson faxed to [facility].   Sent message to IR, waiting on PICC appointment time. Patient will need to go to short stay.   [name] Ano, RN
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
## 2021-05-10 NOTE — Telephone Encounter (Signed)
PICC appointment scheduled for 05/17/21 at 2pm. RN called patient, he is agreeable with appointment time and location.   Relayed appointment time to [facility], let her know patient will be going to short stay for first doses.   Will need to fax orders to short stay once copay is determined along with orders for ESR and CRP to be drawn at short stay.   Beryle Flock, RN

## 2021-05-11 ENCOUNTER — Encounter (HOSPITAL_BASED_OUTPATIENT_CLINIC_OR_DEPARTMENT_OTHER): Payer: Medicaid Other | Admitting: Physician Assistant

## 2021-05-11 DIAGNOSIS — L89154 Pressure ulcer of sacral region, stage 4: Secondary | ICD-10-CM | POA: Diagnosis not present

## 2021-05-11 NOTE — Progress Notes (Addendum)
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 diarrhea. 12/22/2020 upon evaluation today patient appears to be doing better in regard to his wounds. With that being said I do not see any signs of infection which is great news. Overall I think that he is making good progress here. I do not even know the skin needed flap in regard to the left sacral region. Nonetheless I do think that he is having some issues he tells me what he feels like may be [name] dislocation of his right hip I think he needs to see orthopedics as soon as possible in that regard. T give him information for that today. o 01/05/2021 upon evaluation today patient appears to be doing well with regard to his wound. He has been tolerating the dressing changes without complication both in regard to the sacral region and the ankle although he has not gotten the Santyl we really need to see about getting that as soon as possible. He did receive [name] call from  the pharmacy she just did not get the prescription at that point. 01/19/2021 upon evaluation today patient appears to be doing well with regard to his wounds currently. Both appear to be fairly clean. Fortunately there is no signs of active infection at this time. No fevers, chills,  nausea, vomiting, or diarrhea. 02/16/2021 upon evaluation today patient appears to be doing decently well in regard to the sacral wound as well as his ankle wound. Both are showing signs of significant improvement which is great news and I am pleased in that regard. There does not appear to be any evidence of infection which is also excellent news. Unfortunately he has [name] right ischial ulcer which is new and unfortunately I think this is also unstageable which means we are unsure how deep this is really the end up being. Obviously I think this is [name] big deal. 4/19; patient presents for evaluation of his right ischial ulcer and right ankle wound. He has been using Santyl to the right ischial ulcer and collagen to the ankle wound. He reports no issues today. 03/30/2021 upon evaluation today patient appears to be doing worse in regard to his wound in the right ischial location. Fortunately there does not appear to be any signs of active infection at this time which is great news systemically though locally I feel like this likely is infected. I am can obtain [name] culture today to see what shows so we can adjust and treat him appropriately. With that being said the ankle appears to be doing okay and there was [name] gluteal region on the right that was in question but I do not see anything that is actually open here. 04/06/2021 upon evaluation today patient appears to be doing poorly in regard to his wound. He is unfortunately showing signs of significant infection in my opinion. This is the right ischial location. He does actually have necrotic bone noted in the base of the wound unfortunately. This also has [name] significant odor at this point. I think that coupled with the fevers been having as high as 102 although he is 100.3 right now he feels hot to touch all over. I do believe that this is likely osteomyelitis and I believe that he really needs to be treated aggressively at this point I would recommend that he needs  to go to the ER for possible and likely hospital admission. I think IV antibiotics are going to be [name] necessary at this time. 5/27; patient was last seen 2 weeks ago. He was sent to the ED for decline in his wound and likelihood of osteomyelitis. He states he went and it was all taken care of. He states he is here only for debridement. He denies signs of infection. He overall feels well 05/04/2021 upon evaluation today patient appears to be doing really about the same in regard to the overall size of his wound although it is significantly cleaner compared to what it was previous. There does not appear to be any signs of systemic infection though locally there still is some necrotic tissue I think that the erythema and warmth is much better. With that being said there is some necrotic bone noted I would like to take [name] sample of this to send for pathology and culture so that we know how to treat everything here. He does have an infectious disease referral next week on Tuesday. Subsequently that we will give them something to go off of as well as far as figuring out the best treatment  course going forward. 05/11/2021 upon evaluation today patient appears to be doing well with regard to his wound especially compared to last week. Fortunately there does not appear to be any signs of active infection which is great news. No fevers, chills, nausea, vomiting, or diarrhea. The patient does have still [name] fairly significant wound they are going to start him on IV antibiotic therapy. He did have Proteus and Pseudomonas noted on his culture. He also had acute osteomyelitis noted on the pathology report from the bone biopsy. Patient did see Dr. Odette Fraction who after reviewing the wound as well as the testing that have been performed as noted above started the patient on doxycycline, Levaquin, and metronidazole as an outpatient and to he get set up for the PICC line. Once the PICC line is established he will be  taking daptomycin along with cefepime and then still the oral metronidazole. Objective Constitutional Well-nourished and well-hydrated in no acute distress. Vitals Time Taken: 2:27 PM, Height: 71 in, Weight: 136 lbs, BMI: 19, Temperature: 99.1 F, Pulse: 98 bpm, Respiratory Rate: 18 breaths/min, Blood Pressure: 141/82 mmHg. Respiratory normal breathing without difficulty. Psychiatric this patient is able to make decisions and demonstrates good insight into disease process. Alert and Oriented x 3. pleasant and cooperative. General Notes: Upon inspection patient's wound bed actually showed signs of again [name] fairly good granulation no significant necrotic tissue. I think it actually be okay for [name] wound VAC if we can indeed get him under control from the standpoint of infection. The good news is he did see infectious disease and they are going to be working on that side of things as far as getting him moving in [name] inappropriate direction with regard to the osteomyelitis. Once we get that well treated for about [name] week and then we could initiate [name] wound VAC which I think would be the ideal thing for him to be honest. Integumentary (Hair, Skin) Wound #6 status is Open. Original cause of wound was Trauma. The date acquired was: 02/10/2021. The wound has been in treatment 12 weeks. The wound is located on the Right Ischium. The wound measures 7cm length x 4.8cm width x 4cm depth; 26.389cm^2 area and 105.558cm^3 volume. There is bone, tendon, and Fat Layer (Subcutaneous Tissue) exposed. Tunneling has been noted at 12:00 with [name] maximum distance of 6.5cm. Undermining begins at 1:00 and ends at 5:00 with [name] maximum distance of 1.6cm. There is [name] large amount of purulent drainage noted. The wound margin is well defined and not attached to the wound base. There is large (67-100%) red, pink granulation within the wound bed. There is [name] small (1-33%) amount of necrotic tissue within the wound bed including Adherent  Slough. Assessment Active Problems ICD-10 Pressure ulcer of sacral region, stage 4 Pressure ulcer of left ankle, stage 3 Pressure ulcer of right buttock, unstageable Unspecified severe protein-calorie malnutrition Paraplegia, complete Other chronic osteomyelitis, other site Plan Follow-up Appointments: Return Appointment in 1 week. Bathing/ Shower/ Hygiene: May shower with protection but do not get wound dressing(s) wet. Off-Loading: Turn and reposition every 2 hours - be sure to lift up off the chair with arms every hour while in wheelchair Other: - pillows under left calf to keep pressure of left lateral ankle Non Wound Condition: Protect area with: - Protect sacrum with vaseline or zinc oxide WOUND #6: - Ischium Wound Laterality: Right Cleanser: Normal Saline 1 x Per Day/30 Days Discharge Instructions: Cleanse the wound with Normal Saline prior to applying [name] clean  dressing using gauze sponges, not tissue or cotton balls. Prim Dressing: Dakin's Solution 0.25%, 16 (oz) 1 x Per Day/30 Days ary Discharge Instructions: Moisten gauze with Dakin's solution and pack lightly into wound Secondary Dressing: ABD Pad, 5x9 1 x Per Day/30 Days Discharge Instructions: Apply over primary dressing as directed. Secured With: 84M Medipore H Soft Cloth Surgical T 4 x 2 (in/yd) 1 x Per Day/30 Days ape Discharge Instructions: Secure dressing with tape as directed. 1. Would recommend currently that we going continue with the wound care measures as before and the patient is in agreement with the plan this includes the Dakin's moistened gauze dressing which I think is can be the best way to go. 2. I am also can recommend that we continue with ABD pad to cover secured with tape. 3. I am also can recommend that we have the patient continue to monitor for any signs of worsening as far as infection is concerned. He is in agreement with that plan as well. If anything does change or worsening needs to go to the  ER soon as possible he knows this. We will see patient back for reevaluation in 1 week here in the clinic. If anything worsens or changes patient will contact our office for additional recommendations. Electronic Signature(s) Signed: 05/11/2021 3:21:35 PM By: Lenda KelpStone III, Jermichael Belmares PA-C Previous Signature: 05/11/2021 3:18:13 PM Version By: Lenda KelpStone III, Kerrington Greenhalgh PA-C Entered By: Lenda KelpStone III, Lev Cervone on 05/11/2021 15:21:35 -------------------------------------------------------------------------------- SuperBill Details Patient Name: Date of Service: [name], [name] 05/11/2021 Medical Record Number: [id number] Patient Account Number: [id number] Date of Birth/Sex: Treating RN: 11/23/1991 (29 y.o. [name]) Boehlein, Linda Primary Care Provider: PCP, NO Other Clinician: Referring Provider: Treating Provider/Extender: Albertine GratesStone III, Scotti Kosta [name], [name] Weeks in Treatment: 40 Diagnosis Coding ICD-10 Codes Code Description L89.154 Pressure ulcer of sacral region, stage 4 L89.523 Pressure ulcer of left ankle, stage 3 L89.310 Pressure ulcer of right buttock, unstageable E43 Unspecified severe protein-calorie malnutrition G82.21 Paraplegia, complete M86.68 Other chronic osteomyelitis, other site Facility Procedures CPT4 Code: [id number] Description: 99213 - WOUND CARE VISIT-LEV 3 EST PT Modifier: Quantity: 1 Physician Procedures : CPT4 Code Description Modifier [id number] 99214 - WC PHYS LEVEL 4 - EST PT ICD-10 Diagnosis Description L89.154 Pressure ulcer of sacral region, stage 4 L89.523 Pressure ulcer of left ankle, stage 3 L89.310 Pressure ulcer of right buttock, unstageable E43  Unspecified severe protein-calorie malnutrition Quantity: 1 Electronic Signature(s) Signed: 05/11/2021 3:21:57 PM By: Lenda KelpStone III, Raelea Gosse PA-C Previous Signature: 05/11/2021 3:21:46 PM Version By: Lenda KelpStone III, Adrienne Delay PA-C Entered By: Lenda KelpStone III, Carmeron Heady on 05/11/2021 15:21:57

## 2021-05-11 NOTE — Progress Notes (Addendum)
[name], [name] L. ([id number]) Visit Report for 05/11/2021 Arrival Information Details Patient Name: Date of Service: [name], [name] 05/11/2021 2:15 PM Medical Record Number: [id number] Patient Account Number: [id number] Date of Birth/Sex: Treating RN: 08/19/1991 (29 y.o. Lytle MichaelsM) Barnhart, Jodi Primary Care Madelaine Whipple: PCP, NO Other Clinician: Referring Rease Wence: Treating Keily Lepp/Extender: Albertine GratesStone III, Hoyt [name], [name] Weeks in Treatment: 40 Visit Information History Since Last Visit Added or deleted any medications: No Patient Arrived: Wheel Chair Any new allergies or adverse reactions: No Arrival Time: 14:22 Had a fall or experienced change in No Transfer Assistance: Manual activities of daily living that may affect Patient Identification Verified: Yes risk of falls: Secondary Verification Process Completed: Yes Signs or symptoms of abuse/neglect since last visito No Patient Requires Transmission-Based Precautions: No Hospitalized since last visit: No Patient Has Alerts: No Implantable device outside of the clinic excluding No cellular tissue based products placed in the center since last visit: Has Dressing in Place as Prescribed: Yes Pain Present Now: No Electronic Signature(s) Signed: 05/11/2021 2:58:24 PM By: Antonieta IbaBarnhart, Jodi Entered By: Antonieta IbaBarnhart, Jodi on 05/11/2021 14:28:25 -------------------------------------------------------------------------------- Clinic Level of Care Assessment Details Patient Name: Date of Service: [name], [name] 05/11/2021 2:15 PM Medical Record Number: [id number] Patient Account Number: [id number] Date of Birth/Sex: Treating RN: 10/16/1991 (29 y.o. [name]) Boehlein, Linda Primary Care Safire Gordin: PCP, NO Other Clinician: Referring Maydell Knoebel: Treating Athens Lebeau/Extender: Albertine GratesStone III, Hoyt [name], [name] Weeks in Treatment: 40 Clinic Level of Care Assessment Items TOOL 4 Quantity Score []  - 0 Use when only an EandM is performed on FOLLOW-UP  visit ASSESSMENTS - Nursing Assessment / Reassessment X- 1 10 Reassessment of Co-morbidities (includes updates in patient status) X- 1 5 Reassessment of Adherence to Treatment Plan ASSESSMENTS - Wound and Skin A ssessment / Reassessment X - Simple Wound Assessment / Reassessment - one wound 1 5 []  - 0 Complex Wound Assessment / Reassessment - multiple wounds []  - 0 Dermatologic / Skin Assessment (not related to wound area) ASSESSMENTS - Focused Assessment []  - 0 Circumferential Edema Measurements - multi extremities []  - 0 Nutritional Assessment / Counseling / Intervention []  - 0 Lower Extremity Assessment (monofilament, tuning fork, pulses) []  - 0 Peripheral Arterial Disease Assessment (using hand held doppler) ASSESSMENTS - Ostomy and/or Continence Assessment and Care []  - 0 Incontinence Assessment and Management []  - 0 Ostomy Care Assessment and Management (repouching, etc.) PROCESS - Coordination of Care X - Simple Patient / Family Education for ongoing care 1 15 []  - 0 Complex (extensive) Patient / Family Education for ongoing care X- 1 10 Staff obtains ChiropractorConsents, Records, T Results / Process Orders est []  - 0 Staff telephones HHA, Nursing Homes / Clarify orders / etc []  - 0 Routine Transfer to another Facility (non-emergent condition) []  - 0 Routine Hospital Admission (non-emergent condition) []  - 0 New Admissions / Manufacturing engineernsurance Authorizations / Ordering NPWT Apligraf, etc. , []  - 0 Emergency Hospital Admission (emergent condition) X- 1 10 Simple Discharge Coordination []  - 0 Complex (extensive) Discharge Coordination PROCESS - Special Needs []  - 0 Pediatric / Minor Patient Management []  - 0 Isolation Patient Management []  - 0 Hearing / Language / Visual special needs []  - 0 Assessment of Community assistance (transportation, D/C planning, etc.) []  - 0 Additional assistance / Altered mentation []  - 0 Support Surface(s) Assessment (bed, cushion, seat,  etc.) INTERVENTIONS - Wound Cleansing / Measurement X - Simple Wound Cleansing - one wound 1 5 []  - 0 Complex Wound Cleansing - multiple wounds  X- 1 5 Wound Imaging (photographs - any number of wounds) []  - 0 Wound Tracing (instead of photographs) X- 1 5 Simple Wound Measurement - one wound X- 1 5 Complex Wound Measurement - multiple wounds INTERVENTIONS - Wound Dressings X - Small Wound Dressing one or multiple wounds 1 10 []  - 0 Medium Wound Dressing one or multiple wounds []  - 0 Large Wound Dressing one or multiple wounds X- 1 5 Application of Medications - topical []  - 0 Application of Medications - injection INTERVENTIONS - Miscellaneous []  - 0 External ear exam []  - 0 Specimen Collection (cultures, biopsies, blood, body fluids, etc.) []  - 0 Specimen(s) / Culture(s) sent or taken to Lab for analysis []  - 0 Patient Transfer (multiple staff / / Similar devices) []  - 0 Simple Staple / Suture removal (25 or less) []  - 0 Complex Staple / Suture removal (26 or more) []  - 0 Hypo / Hyperglycemic Management (close monitor of Blood Glucose) []  - 0 Ankle / Brachial Index (ABI) - do not check if billed separately X- 1 5 Vital Signs Has the patient been seen at the hospital within the last three years: Yes Total Score: 95 Level Of Care: New/Established - Level 3 Electronic Signature(s) Signed: 05/11/2021 6:34:46 PM By: RN, BSN Entered By: on 05/11/2021 15:14:32 -------------------------------------------------------------------------------- Encounter Discharge Information Details Patient Name: Date of Service: L. 05/11/2021 2:15 PM Medical Record Number: Patient Account Number: Nurse, adult Date of Birth/Sex: Treating RN: October 25, 1991 (29 y.o. Primary Care Shani Fitch: PCP, NO Other Clinician: Referring Jackob Crookston: Treating Emmersen Garraway/Extender: in  Treatment: 40 Encounter Discharge Information Items Discharge Condition: Stable Ambulatory Status: Wheelchair Discharge Destination: Home Transportation: Private Auto Accompanied By: self Schedule Follow-up Appointment: Yes Clinical Summary of Care: Patient Declined Electronic Signature(s) Signed: 05/11/2021 6:22:58 PM By: 05/13/2021 RN Entered By: Zenaida Deed on 05/11/2021 15:16:39 -------------------------------------------------------------------------------- Lower Extremity Assessment Details Patient Name: Date of Service: ALPER, GUILMETTE L. 05/11/2021 2:15 PM Medical Record Number: 05/13/2021 Patient Account Number: [id number] Date of Birth/Sex: Treating RN: 1991/02/16 (29 y.o. 09/29/1991 Primary Care Conception Doebler: PCP, NO Other Clinician: Referring Arnaldo Heffron: Treating Anibal Quinby/Extender: [name] Weeks in Treatment: 40 Electronic Signature(s) Signed: 05/11/2021 2:58:24 PM By: 05/13/2021 Entered By: Fonnie Mu on 05/11/2021 14:22:49 -------------------------------------------------------------------------------- Multi-Disciplinary Care Plan Details Patient Name: Date of Service: SHANE, BADEAUX NIO L. 05/11/2021 2:15 PM Medical Record Number: 05/13/2021 Patient Account Number: [id number] Date of Birth/Sex: Treating RN: 05-15-1991 (29 y.o. 09/29/1991 Primary Care Zyria Fiscus: PCP, NO Other Clinician: Referring Cameryn Schum: Treating Ivelisse Culverhouse/Extender: [name] in Treatment: 40 Multidisciplinary Care Plan reviewed with physician Active Inactive Pressure Nursing Diagnoses: Knowledge deficit related to causes and risk factors for pressure ulcer development Goals: Patient/caregiver will verbalize risk factors for pressure ulcer development Date Initiated: 07/30/2020 Target Resolution Date: 05/25/2021 Goal Status: Active Interventions: Provide education on pressure ulcers Notes: Wound/Skin  Impairment Nursing Diagnoses: Impaired tissue integrity Goals: Patient/caregiver will verbalize understanding of skin care regimen Date Initiated: 01/05/2021 Target Resolution Date: 05/25/2021 Goal Status: Active Ulcer/skin breakdown will have a volume reduction of 50% by week 8 Date Initiated: 07/30/2020 Date Inactivated: 10/20/2020 Target Resolution Date: 09/27/2020 Goal Status: Unmet Unmet Reason: Infection Ulcer/skin breakdown will have a volume reduction of 80% by week 12 Date Initiated: 10/20/2020 Date Inactivated: 12/22/2020 Target Resolution Date: 11/19/2020 Unmet Reason: paraplegic, difficulty Goal Status: Unmet offloading Interventions:  Provide education on ulcer and skin care Notes: Electronic Signature(s) Signed: 05/11/2021 6:34:46 PM By: Zenaida Deed RN, BSN Entered By: Zenaida Deed on 05/11/2021 15:08:01 -------------------------------------------------------------------------------- Pain Assessment Details Patient Name: Date of Service: JAWAD, WIACEK NIO L. 05/11/2021 2:15 PM Medical Record Number: [id number] Patient Account Number: [id number] Date of Birth/Sex: Treating RN: 05-29-1991 (29 y.o. [name] Primary Care Mical Brun: PCP, NO Other Clinician: Referring Najah Liverman: Treating Azel Gumina/Extender: [name] in Treatment: 40 Active Problems Location of Pain Severity and Description of Pain Patient Has Paino No Site Locations Pain Management and Medication Current Pain Management: Electronic Signature(s) Signed: 05/11/2021 2:58:24 PM By: Antonieta Iba Entered By: Antonieta Iba on 05/11/2021 14:29:05 -------------------------------------------------------------------------------- Patient/Caregiver Education Details Patient Name: Date of Service: [name] 6/15/2022andnbsp2:15 PM Medical Record Number: [id number] Patient Account Number: [id number] Date of Birth/Gender: Treating RN: 09/25/1991 (29 y.o. [name] Schooner Primary Care Physician: PCP, NO Other Clinician: Referring Physician: Treating Physician/Extender: [name] in Treatment: 40 Education Assessment Education Provided To: Patient Education Topics Provided Pressure: Methods: Explain/Verbal Responses: Reinforcements needed, State content correctly Wound/Skin Impairment: Methods: Explain/Verbal Responses: Reinforcements needed, State content correctly Electronic Signature(s) Signed: 05/11/2021 6:34:46 PM By: Zenaida Deed RN, BSN Entered By: Zenaida Deed on 05/11/2021 15:08:56 -------------------------------------------------------------------------------- Wound Assessment Details Patient Name: Date of Service: [name] L. 05/11/2021 2:15 PM Medical Record Number: [id number] Patient Account Number: [id number] Date of Birth/Sex: Treating RN: [birth year] (29 y.o. [name] Primary Care Keyani Rigdon: PCP, NO Other Clinician: Referring Kaydn Kumpf: Treating Lissa Rowles/Extender: Angus Palms Weeks in Treatment: 40 Wound Status Wound Number: 6 Primary Etiology: Pressure Ulcer Wound Location: Right Ischium Wound Status: Open Wounding Event: Trauma Comorbid History: Paraplegia Date Acquired: 02/10/2021 Weeks Of Treatment: 12 Clustered Wound: No Photos Wound Measurements Length: (cm) 7 Width: (cm) 4.8 Depth: (cm) 4 Area: (cm) 26.389 Volume: (cm) 105.558 % Reduction in Area: -72.3% % Reduction in Volume: -6790.2% Epithelialization: None Tunneling: Yes Position (o'clock): 12 Maximum Distance: (cm) 6.5 Undermining: Yes Starting Position (o'clock): 1 Ending Position (o'clock): 5 Maximum Distance: (cm) 1.6 Wound Description Classification: Category/Stage IV Wound Margin: Well defined, not attached Exudate Amount: Large Exudate Type: Purulent Exudate Color: yellow, brown, green Foul Odor After Cleansing: No Slough/Fibrino Yes Wound  Bed Granulation Amount: Large (67-100%) Exposed Structure Granulation Quality: Red, Pink Fascia Exposed: No Necrotic Amount: Small (1-33%) Fat Layer (Subcutaneous Tissue) Exposed: Yes Necrotic Quality: Adherent Slough Tendon Exposed: Yes Muscle Exposed: No Joint Exposed: No Bone Exposed: Yes Treatment Notes Wound #6 (Ischium) Wound Laterality: Right Cleanser Normal Saline Discharge Instruction: Cleanse the wound with Normal Saline prior to applying a clean dressing using gauze sponges, not tissue or cotton balls. Peri-Wound Care Topical Primary Dressing Dakin's Solution 0.25%, 16 (oz) Discharge Instruction: Moisten gauze with Dakin's solution and pack lightly into wound Secondary Dressing ABD Pad, 5x9 Discharge Instruction: Apply over primary dressing as directed. Secured With 24M Medipore H Soft Cloth Surgical T 4 x 2 (in/yd) ape Discharge Instruction: Secure dressing with tape as directed. Compression Wrap Compression Stockings Add-Ons Electronic Signature(s) Signed: 05/12/2021 5:21:02 PM By: [name] Ito Signed: 05/12/2021 6:07:06 PM By: Antonieta Iba Previous Signature: 05/11/2021 2:58:24 PM Version By: Antonieta Iba Entered By: [name] Ito on 05/12/2021 16:34:43 -------------------------------------------------------------------------------- Vitals Details Patient Name: Date of Service: [name] L. 05/11/2021 2:15 PM Medical Record Number: [id number] Patient Account Number: [id number] Date of Birth/Sex: Treating RN: 1991/08/12 (29 y.o. [name] Primary Care Lamanda Rudder:  PCP, NO Other Clinician: Referring Vonnie Ligman: Treating Vada Yellen/Extender: Angus Palms Weeks in Treatment: 40 Vital Signs Time Taken: 14:27 Temperature (F): 99.1 Height (in): 71 Pulse (bpm): 98 Weight (lbs): 136 Respiratory Rate (breaths/min): 18 Body Mass Index (BMI): 19 Blood Pressure (mmHg): 141/82 Reference Range: 80 - 120 mg / dl Electronic  Signature(s) Signed: 05/11/2021 2:58:24 PM By: Antonieta Iba Entered By: Antonieta Iba on 05/11/2021 14:28:54

## 2021-05-12 NOTE — Telephone Encounter (Signed)
Spoke with Clydie Braun at [facility] short stay, she confirms that nursing will be able to do lab draws while patient is there for first doses. Faxed first dose and lab orders to short stay. Received receipt of successful fax transmission.   Sandie Ano, RN

## 2021-05-13 ENCOUNTER — Other Ambulatory Visit (HOSPITAL_COMMUNITY): Payer: Self-pay

## 2021-05-13 NOTE — Telephone Encounter (Signed)
Relayed copay to patient, he is agreeable. Advised him that [facility] will deliver all IV medications and supplies, but that he must go to pharmacy to pick up oral antibiotics that were prescribed until IV antibiotics are started. Patient verbalized understanding and has no further questions.   Sandie Ano, RN

## 2021-05-17 ENCOUNTER — Ambulatory Visit (HOSPITAL_COMMUNITY)
Admission: RE | Admit: 2021-05-17 | Discharge: 2021-05-17 | Disposition: A | Discharge status: Home / Self Care | Payer: Medicaid Other | Source: Ambulatory Visit | Attending: Infectious Diseases | Admitting: Infectious Diseases

## 2021-05-17 ENCOUNTER — Other Ambulatory Visit: Payer: Self-pay

## 2021-05-17 ENCOUNTER — Encounter (HOSPITAL_COMMUNITY)
Admission: RE | Admit: 2021-05-17 | Discharge: 2021-05-17 | Disposition: A | Discharge status: Home / Self Care | Payer: Medicaid Other | Source: Ambulatory Visit | Attending: Infectious Diseases | Admitting: Infectious Diseases

## 2021-05-17 DIAGNOSIS — M869 Osteomyelitis, unspecified: Secondary | ICD-10-CM | POA: Insufficient documentation

## 2021-05-17 MED ORDER — SODIUM CHLORIDE 0.9 % IV SOLN
450.0000 mg | Freq: Every day | INTRAVENOUS | Status: DC
  Administered 2021-05-17: 450 mg via INTRAVENOUS
  Filled 2021-05-17: qty 9

## 2021-05-17 MED ORDER — SODIUM CHLORIDE 0.9 % IV SOLN
2.0000 g | Freq: Once | INTRAVENOUS | Status: AC
  Administered 2021-05-17: 2 g via INTRAVENOUS
  Filled 2021-05-17: qty 2

## 2021-05-17 MED ORDER — LIDOCAINE HCL 1 % IJ SOLN
INTRAMUSCULAR | Status: AC
  Administered 2021-05-17: 2 mL via SUBCUTANEOUS
  Filled 2021-05-17: qty 20

## 2021-05-17 MED ORDER — HEPARIN SOD (PORK) LOCK FLUSH 100 UNIT/ML IV SOLN
INTRAVENOUS | Status: AC
  Administered 2021-05-17: 250 [IU]
  Filled 2021-05-17: qty 5

## 2021-05-17 MED ORDER — HEPARIN SOD (PORK) LOCK FLUSH 100 UNIT/ML IV SOLN: 250.0000 [IU] | Freq: Once | INTRAVENOUS | Status: DC

## 2021-05-17 NOTE — Procedures (Signed)
PROCEDURE SUMMARY:  Successful placement of single lumen PICC line to right brachial vein. Length 36 cm Tip at lower SVC/RA PICC capped No complications Ready for use  EBL < 5 mL   Anaya Bovee H Koree Staheli PA-C 05/17/2021, 2:21 PM

## 2021-05-18 ENCOUNTER — Encounter (HOSPITAL_BASED_OUTPATIENT_CLINIC_OR_DEPARTMENT_OTHER): Payer: Medicaid Other | Admitting: Physician Assistant

## 2021-05-18 DIAGNOSIS — L89154 Pressure ulcer of sacral region, stage 4: Secondary | ICD-10-CM | POA: Diagnosis not present

## 2021-05-18 NOTE — Progress Notes (Signed)
[employer], [employer] ([id number]) Visit Report for 05/18/2021 Arrival Information Details Patient Name: Date of Service: [name], [name] 05/18/2021 2:45 PM Medical Record Number: [id number] Patient Account Number: [id number] Date of Birth/Sex: Treating RN: 02-05-1991 (29 y.o. [name] Primary Care Jerimiah Wolman: PCP, NO Other Clinician: Referring Clarrissa Shimkus: Treating Sanita Estrada/Extender: [name] in Treatment: 41 Visit Information History Since Last Visit Added or deleted any medications: No Patient Arrived: Wheel Chair Any new allergies or adverse reactions: No Arrival Time: 14:52 Had [name] fall or experienced change in No Transfer Assistance: Manual activities of daily living that may affect Patient Identification Verified: Yes risk of falls: Secondary Verification Process Completed: Yes Signs or symptoms of abuse/neglect since last visito No Patient Requires Transmission-Based Precautions: No Hospitalized since last visit: No Patient Has Alerts: No Implantable device outside of the clinic excluding No cellular tissue based products placed in the center since last visit: Has Dressing in Place as Prescribed: Yes Pain Present Now: No Electronic Signature(s) Signed: 05/18/2021 5:44:30 PM By: Antonieta Iba Entered By: Antonieta Iba on 05/18/2021 14:52:52 -------------------------------------------------------------------------------- Clinic Level of Care Assessment Details Patient Name: Date of Service: [name], [name] 05/18/2021 2:45 PM Medical Record Number: [id number] Patient Account Number: [id number] Date of Birth/Sex: Treating RN: [birth year] (29 y.o. [name] Primary Care Justyne Roell: PCP, NO Other Clinician: Referring Rivers Gassmann: Treating Ivoree Felmlee/Extender: [name] in Treatment: 41 Clinic Level of Care Assessment Items TOOL 4 Quantity Score []  - 0 Use when only an EandM is performed on FOLLOW-UP  visit ASSESSMENTS - Nursing Assessment / Reassessment X- 1 10 Reassessment of Co-morbidities (includes updates in patient status) X- 1 5 Reassessment of Adherence to Treatment Plan ASSESSMENTS - Wound and Skin [name] ssessment / Reassessment []  - 0 Simple Wound Assessment / Reassessment - one wound X- 3 5 Complex Wound Assessment / Reassessment - multiple wounds []  - 0 Dermatologic / Skin Assessment (not related to wound area) ASSESSMENTS - Focused Assessment []  - 0 Circumferential Edema Measurements - multi extremities []  - 0 Nutritional Assessment / Counseling / Intervention []  - 0 Lower Extremity Assessment (monofilament, tuning fork, pulses) []  - 0 Peripheral Arterial Disease Assessment (using hand held doppler) ASSESSMENTS - Ostomy and/or Continence Assessment and Care []  - 0 Incontinence Assessment and Management []  - 0 Ostomy Care Assessment and Management (repouching, etc.) PROCESS - Coordination of Care X - Simple Patient / Family Education for ongoing care 1 15 []  - 0 Complex (extensive) Patient / Family Education for ongoing care X- 1 10 Staff obtains , Records, T Results / Process Orders est []  - 0 Staff telephones HHA, Nursing Homes / Clarify orders / etc []  - 0 Routine Transfer to another Facility (non-emergent condition) []  - 0 Routine Hospital Admission (non-emergent condition) []  - 0 New Admissions / / Ordering NPWT Apligraf, etc. , []  - 0 Emergency Hospital Admission (emergent condition) X- 1 10 Simple Discharge Coordination []  - 0 Complex (extensive) Discharge Coordination PROCESS - Special Needs []  - 0 Pediatric / Minor Patient Management []  - 0 Isolation Patient Management []  - 0 Hearing / Language / Visual special needs []  - 0 Assessment of Community assistance (transportation, D/C planning, etc.) []  - 0 Additional assistance / Altered mentation []  - 0 Support Surface(s) Assessment (bed, cushion, seat,  etc.) INTERVENTIONS - Wound Cleansing / Measurement []  - 0 Simple Wound Cleansing - one wound X- 3 5 Complex Wound Cleansing - multiple wounds X- 1  5 Wound Imaging (photographs - any number of wounds) []  - 0 Wound Tracing (instead of photographs) []  - 0 Simple Wound Measurement - one wound X- 3 5 Complex Wound Measurement - multiple wounds INTERVENTIONS - Wound Dressings X - Small Wound Dressing one or multiple wounds 3 10 []  - 0 Medium Wound Dressing one or multiple wounds []  - 0 Large Wound Dressing one or multiple wounds X- 1 5 Application of Medications - topical []  - 0 Application of Medications - injection INTERVENTIONS - Miscellaneous []  - 0 External ear exam []  - 0 Specimen Collection (cultures, biopsies, blood, body fluids, etc.) []  - 0 Specimen(s) / Culture(s) sent or taken to Lab for analysis []  - 0 Patient Transfer (multiple staff / / Similar devices) []  - 0 Simple Staple / Suture removal (25 or less) []  - 0 Complex Staple / Suture removal (26 or more) []  - 0 Hypo / Hyperglycemic Management (close monitor of Blood Glucose) []  - 0 Ankle / Brachial Index (ABI) - do not check if billed separately X- 1 5 Vital Signs Has the patient been seen at the hospital within the last three years: Yes Total Score: 140 Level Of Care: New/Established - Level 4 Electronic Signature(s) Signed: 05/18/2021 5:44:00 PM By: RN, BSN Entered By: on 05/18/2021 15:25:19 -------------------------------------------------------------------------------- Encounter Discharge Information Details Patient Name: Date of Service: . 05/18/2021 2:45 PM Medical Record Number: Patient Account Number: Date of Birth/Sex: Treating RN: 15-Apr-1991 (29 y.o. Primary Care Nyaire Denbleyker: PCP, NO Other Clinician: Referring Sharece Fleischhacker: Treating Cody Oliger/Extender: in  Treatment: [phone] Encounter Discharge Information Items Discharge Condition: Stable Ambulatory Status: Wheelchair Discharge Destination: Home Transportation: Private Auto Accompanied By: self Schedule Follow-up Appointment: Yes Clinical Summary of Care: Patient Declined Electronic Signature(s) Signed: 05/18/2021 5:40:17 PM By: 05/20/2021 RN Entered By: Zenaida Deed on 05/18/2021 15:34:52 -------------------------------------------------------------------------------- Lower Extremity Assessment Details Patient Name: Date of Service: [name], [name] 05/18/2021 2:45 PM Medical Record Number: 05/20/2021 Patient Account Number: [id number] Date of Birth/Sex: Treating RN: 1991-04-12 (29 y.o. 09/29/1991 Primary Care Emaline Karnes: PCP, NO Other Clinician: Referring Courtlynn Holloman: Treating Melaney Tellefsen/Extender: [name] in Treatment: 41 Electronic Signature(s) Signed: 05/18/2021 5:44:30 PM By: 44 Entered By: 05/20/2021 on 05/18/2021 14:53:27 -------------------------------------------------------------------------------- Multi-Disciplinary Care Plan Details Patient Name: Date of Service: [name], [name] 05/18/2021 2:45 PM Medical Record Number: [name] Regulus Patient Account Number: 05/20/2021 Date of Birth/Sex: Treating RN: 09-04-1991 (29 y.o. [id number] Primary Care Taryll Reichenberger: PCP, NO Other Clinician: Referring Alexandros Ewan: Treating Danyla Wattley/Extender: 09/29/1991 in Treatment: 23 Multidisciplinary Care Plan reviewed with physician Active Inactive Pressure Nursing Diagnoses: Knowledge deficit related to causes and risk factors for pressure ulcer development Goals: Patient/caregiver will verbalize risk factors for pressure ulcer development Date Initiated: 07/30/2020 Target Resolution Date: 05/25/2021 Goal Status: Active Interventions: Provide education on pressure ulcers Notes: Wound/Skin  Impairment Nursing Diagnoses: Impaired tissue integrity Goals: Patient/caregiver will verbalize understanding of skin care regimen Date Initiated: 01/05/2021 Target Resolution Date: 05/25/2021 Goal Status: Active Ulcer/skin breakdown will have [name] volume reduction of 50% by week 8 Date Initiated: 07/30/2020 Date Inactivated: 10/20/2020 Target Resolution Date: 09/27/2020 Goal Status: Unmet Unmet Reason: Infection Ulcer/skin breakdown will have [name] volume reduction of 80% by week 12 Date Initiated: 10/20/2020 Date Inactivated: 12/22/2020 Target Resolution Date: 11/19/2020 Unmet Reason: paraplegic, difficulty Goal Status: Unmet offloading Interventions: Provide education  on ulcer and skin care Notes: Electronic Signature(s) Signed: 05/18/2021 5:44:00 PM By: Zenaida DeedBoehlein, Linda RN, BSN Entered By: Zenaida DeedBoehlein, Linda on 05/18/2021 15:23:01 -------------------------------------------------------------------------------- Pain Assessment Details Patient Name: Date of Service: [name], [name] 05/18/2021 2:45 PM Medical Record Number: [id number] Patient Account Number: [id number] Date of Birth/Sex: Treating RN: 01/15/1991 (29 y.o. Lytle MichaelsM) Barnhart, Jodi Primary Care Bannie Lobban: PCP, NO Other Clinician: Referring Phenix Vandermeulen: Treating Lea Baine/Extender: Albertine GratesStone III, Hoyt [name], [name] Weeks in Treatment: [phone] Active Problems Location of Pain Severity and Description of Pain Patient Has Paino No Site Locations Pain Management and Medication Current Pain Management: Electronic Signature(s) Signed: 05/18/2021 5:44:30 PM By: Antonieta IbaBarnhart, Jodi Entered By: Antonieta IbaBarnhart, Jodi on 05/18/2021 14:53:20 -------------------------------------------------------------------------------- Patient/Caregiver Education Details Patient Name: Date of Service: [name] RegulusUSSELL, [name] 6/22/2022andnbsp2:45 PM Medical Record Number: [id number] Patient Account Number: [id number] Date of Birth/Gender: Treating RN: 08/10/1991 (29 y.o. [name])  Boehlein, Linda Primary Care Physician: PCP, NO Other Clinician: Referring Physician: Treating Physician/Extender: Albertine GratesStone III, Hoyt [name], [name] Weeks in Treatment: 8441 Education Assessment Education Provided To: Patient Education Topics Provided Pressure: Methods: Explain/Verbal Responses: Reinforcements needed, State content correctly Wound/Skin Impairment: Methods: Explain/Verbal Responses: Reinforcements needed, State content correctly Electronic Signature(s) Signed: 05/18/2021 5:44:00 PM By: Zenaida DeedBoehlein, Linda RN, BSN Entered By: Zenaida DeedBoehlein, Linda on 05/18/2021 15:23:24 -------------------------------------------------------------------------------- Wound Assessment Details Patient Name: Date of Service: [name], [name] 05/18/2021 2:45 PM Medical Record Number: [id number] Patient Account Number: [id number] Date of Birth/Sex: Treating RN: 02/14/1991 (29 y.o. Lytle MichaelsM) Barnhart, Jodi Primary Care Keysi Oelkers: PCP, NO Other Clinician: Referring Michel Eskelson: Treating Sopheap Basic/Extender: Albertine GratesStone III, Hoyt [name], [name] Weeks in Treatment: 41 Wound Status Wound Number: 6 Primary Etiology: Pressure Ulcer Wound Location: Right Ischium Wound Status: Open Wounding Event: Trauma Comorbid History: Paraplegia Date Acquired: 02/10/2021 Weeks Of Treatment: 13 Clustered Wound: No Photos Wound Measurements Length: (cm) 7.5 Width: (cm) 4.2 Depth: (cm) 3.2 Area: (cm) 24.74 Volume: (cm) 79.168 % Reduction in Area: -61.5% % Reduction in Volume: -5067.6% Epithelialization: None Tunneling: Yes Position (o'clock): 12 Maximum Distance: (cm) 4.5 Undermining: Yes Starting Position (o'clock): 1 Ending Position (o'clock): 5 Maximum Distance: (cm) 2 Wound Description Classification: Category/Stage IV Wound Margin: Well defined, not attached Exudate Amount: Large Exudate Type: Purulent Exudate Color: yellow, brown, green Foul Odor After Cleansing: No Slough/Fibrino Yes Wound  Bed Granulation Amount: Large (67-100%) Exposed Structure Granulation Quality: Red, Pink Fascia Exposed: No Necrotic Amount: None Present (0%) Fat Layer (Subcutaneous Tissue) Exposed: Yes Tendon Exposed: Yes Muscle Exposed: No Joint Exposed: No Bone Exposed: Yes Treatment Notes Wound #6 (Ischium) Wound Laterality: Right Cleanser Normal Saline Discharge Instruction: Cleanse the wound with Normal Saline prior to applying [name] clean dressing using gauze sponges, not tissue or cotton balls. Peri-Wound Care Topical Primary Dressing Dakin's Solution 0.25%, 16 (oz) Discharge Instruction: Moisten gauze with Dakin's solution and pack lightly into wound Secondary Dressing ABD Pad, 5x9 Discharge Instruction: Apply over primary dressing as directed. Secured With 12M Medipore H Soft Cloth Surgical T 4 x 2 (in/yd) ape Discharge Instruction: Secure dressing with tape as directed. Compression Wrap Compression Stockings Add-Ons Electronic Signature(s) Signed: 05/18/2021 4:31:55 PM By: [name] Itoawkins, Destiny Signed: 05/18/2021 5:44:30 PM By: Antonieta IbaBarnhart, Jodi Entered By: [name] Itoawkins, Destiny on 05/18/2021 16:04:50 -------------------------------------------------------------------------------- Wound Assessment Details Patient Name: Date of Service: [name], [name] 05/18/2021 2:45 PM Medical Record Number: [id number] Patient Account Number: [id number] Date of Birth/Sex: Treating RN: 09/13/1991 (29 y.o. Lytle MichaelsM) Barnhart, Jodi Primary Care Clint Strupp: PCP, NO Other Clinician: Referring Kennie Karapetian: Treating Babe Anthis/Extender: Angus PalmsStone III, Hoyt [name], [name]  Weeks in Treatment: 41 Wound Status Wound Number: 8 Primary Etiology: Pressure Ulcer Wound Location: Left Trochanter Wound Status: Open Wounding Event: Pressure Injury Comorbid History: Paraplegia Date Acquired: 05/09/2021 Weeks Of Treatment: 0 Clustered Wound: No Photos Wound Measurements Length: (cm) 0.7 Width: (cm) 0.7 Depth: (cm) 0.1 Area:  (cm) 0.385 Volume: (cm) 0.038 % Reduction in Area: 0% % Reduction in Volume: 0% Epithelialization: None Tunneling: No Undermining: No Wound Description Classification: Category/Stage II Wound Margin: Distinct, outline attached Exudate Amount: Medium Exudate Type: Serosanguineous Exudate Color: red, brown Foul Odor After Cleansing: No Slough/Fibrino Yes Wound Bed Granulation Amount: Large (67-100%) Exposed Structure Granulation Quality: Red Fascia Exposed: No Necrotic Amount: Small (1-33%) Fat Layer (Subcutaneous Tissue) Exposed: Yes Necrotic Quality: Adherent Slough Tendon Exposed: No Muscle Exposed: No Joint Exposed: No Bone Exposed: No Treatment Notes Wound #8 (Trochanter) Wound Laterality: Left Cleanser Peri-Wound Care Topical Primary Dressing KerraCel Ag Gelling Fiber Dressing, 4x5 in (silver alginate) Discharge Instruction: Apply silver alginate to wound bed as instructed Secondary Dressing Zetuvit Plus Silicone Border Dressing 4x4 (in/in) Discharge Instruction: Apply silicone border over primary dressing as directed. Secured With Compression Wrap Compression Stockings Facilities manager) Signed: 05/18/2021 4:31:55 PM By: [name] Ito Signed: 05/18/2021 5:44:30 PM By: Antonieta Iba Entered By: [name] Ito on 05/18/2021 16:05:05 -------------------------------------------------------------------------------- Wound Assessment Details Patient Name: Date of Service: [name], [name] 05/18/2021 2:45 PM Medical Record Number: [id number] Patient Account Number: [id number] Date of Birth/Sex: Treating RN: 1991-04-28 (29 y.o. [name] Primary Care Lakeidra Reliford: PCP, NO Other Clinician: Referring Emanuell Morina: Treating Markese Bloxham/Extender: Angus Palms Weeks in Treatment: 41 Wound Status Wound Number: 9 Primary Etiology: Pressure Ulcer Wound Location: Left Ischium Wound Status: Open Wounding Event: Pressure  Injury Comorbid History: Paraplegia Date Acquired: 05/09/2021 Weeks Of Treatment: 0 Clustered Wound: No Photos Wound Measurements Length: (cm) 1.7 Width: (cm) 1.7 Depth: (cm) 0.1 Area: (cm) 2.27 Volume: (cm) 0.227 % Reduction in Area: 0% % Reduction in Volume: 0% Epithelialization: None Tunneling: No Undermining: No Wound Description Classification: Category/Stage II Wound Margin: Distinct, outline attached Exudate Amount: Medium Exudate Type: Serosanguineous Exudate Color: red, brown Foul Odor After Cleansing: No Slough/Fibrino No Wound Bed Granulation Amount: Large (67-100%) Exposed Structure Granulation Quality: Red Fascia Exposed: No Necrotic Amount: None Present (0%) Fat Layer (Subcutaneous Tissue) Exposed: Yes Tendon Exposed: No Muscle Exposed: No Joint Exposed: No Bone Exposed: No Treatment Notes Wound #9 (Ischium) Wound Laterality: Left Cleanser Peri-Wound Care Topical Primary Dressing KerraCel Ag Gelling Fiber Dressing, 4x5 in (silver alginate) Discharge Instruction: Apply silver alginate to wound bed as instructed Secondary Dressing Zetuvit Plus Silicone Border Dressing 4x4 (in/in) Discharge Instruction: Apply silicone border over primary dressing as directed. Secured With Compression Wrap Compression Stockings Facilities manager) Signed: 05/18/2021 4:31:55 PM By: [name] Ito Signed: 05/18/2021 5:44:30 PM By: Antonieta Iba Entered By: [name] Ito on 05/18/2021 16:05:22 -------------------------------------------------------------------------------- Vitals Details Patient Name: Date of Service: [name] Regulus. 05/18/2021 2:45 PM Medical Record Number: [id number] Patient Account Number: [id number] Date of Birth/Sex: Treating RN: 1991-03-05 (29 y.o. [name] Primary Care Paralee Pendergrass: PCP, NO Other Clinician: Referring Dewarren Ledbetter: Treating Kein Carlberg/Extender: [name] in Treatment:  41 Vital Signs Time Taken: 14:52 Temperature (F): 99 Height (in): 71 Pulse (bpm): 102 Weight (lbs): 136 Respiratory Rate (breaths/min): 16 Body Mass Index (BMI): 19 Blood Pressure (mmHg): 124/77 Reference Range: 80 - 120 mg / dl Electronic Signature(s) Signed: 05/18/2021 5:44:30 PM By: Antonieta Iba Entered By: Antonieta Iba on 05/18/2021  14:53:12 

## 2021-05-18 NOTE — Progress Notes (Addendum)
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 left lateral malleolus. This had necrotic material in this last time which I removed he says it has been bleeding ever since although it is not bleeding now. He has smaller areas on the left buttock and right Achilles heel. These look somewhat better. He has not been systemically unwell. 10/20/2020 upon evaluation today patient appears to be doing actually better compared to his last evaluation. I did review his note from the discharge summary on 10/16/2020. The patient was in the hospital from 10/13/2020 through 10/16/2020. Subsequently during the time that he was in the hospital he did complete a course of ceftriaxone and Flagyl while he was hospitalized. He was discharged on Augmentin and Flagyl for 14 days. It appears that they had wanted to keep him longer in the hospital but he refused and thus was discharged. Nonetheless his wounds do appear to be doing somewhat better today which is great news as compared to last time we saw him for evaluation. There is no evidence of active infection systemically at this point which is also good news. 11/03/2020 upon evaluation today patient actually appears to be doing excellent in regard to his wounds.  He does tell me that he is think about going back to see Dr. Arita Miss to talk about doing the flap surgery  for the wound on the sacral region. In regard to the left lateral malleolus this is pretty much about closed as far as I am concerned. Obviously he seems to be doing excellent and I am very pleased with where things stand today. Patient is extremely happy to hear this. No fevers, chills, nausea, vomiting, or diarrhea. 12/22/2020 upon evaluation today patient appears to be doing better in regard to his wounds. With that being said I do not see any signs of infection which is great news. Overall I think that he is making good progress here. I do not even know the skin needed flap in regard to the left sacral region. Nonetheless I do think that he is having some issues he tells me what he feels like may be a dislocation of his right hip I think he needs to see orthopedics as soon as possible in that regard. T give him information for that today. o 01/05/2021 upon evaluation today patient appears to be doing well with regard to his wound. He has been tolerating the dressing changes without complication both in regard to the sacral region and the ankle although he has not gotten the Santyl we really need to see about getting that as soon as possible. He did receive a call from the pharmacy she just did not get the prescription at that point. 01/19/2021 upon evaluation today patient appears to be doing well with regard to his wounds currently. Both appear to be fairly clean. Fortunately there is no signs of active infection at this time. No fevers, chills, nausea, vomiting, or diarrhea. 02/16/2021 upon evaluation today patient appears to be doing decently well in regard to the sacral wound as well as his ankle wound. Both are showing signs of significant improvement which is great news and I am pleased in that regard. There does not appear to be any evidence of infection which is also excellent news. Unfortunately he has a right ischial ulcer which is new and unfortunately I think this is also unstageable which  means we are unsure how deep this is really the end up being. Obviously I think this is a big deal. 4/19; patient presents for evaluation of his right ischial ulcer and right ankle wound. He has been using Santyl to the right ischial ulcer and collagen to the ankle wound. He reports no issues today. 03/30/2021 upon evaluation today patient appears to be doing worse in regard to his wound in the right ischial location. Fortunately there does not appear to be any signs of active infection at this time which is great news systemically though locally I feel like this likely is infected. I am can obtain a culture today to see what shows so we can adjust and treat him appropriately. With that being said the ankle appears to be doing okay and there was a gluteal region on the right that was in question but I do not see anything that is actually open here. 04/06/2021 upon evaluation today patient appears to be doing poorly in regard to his wound. He is unfortunately showing signs of significant infection in my opinion. This is the right ischial location. He does actually have necrotic bone noted in the base of the wound unfortunately. This also has a significant odor at this point. I think that coupled with the fevers been having as high as 102 although he  is 100.3 right now he feels hot to touch all over. I do believe that this is likely osteomyelitis and I believe that he really needs to be treated aggressively at this point I would recommend that he needs to go to the ER for possible and likely hospital admission. I think IV antibiotics are going to be a necessary at this time. 5/27; patient was last seen 2 weeks ago. He was sent to the ED for decline in his wound and likelihood of osteomyelitis. He states he went and it was all taken care of. He states he is here only for debridement. He denies signs of infection. He overall feels well 05/04/2021 upon evaluation today patient appears to be doing really about  the same in regard to the overall size of his wound although it is significantly cleaner compared to what it was previous. There does not appear to be any signs of systemic infection though locally there still is some necrotic tissue I think that the erythema and warmth is much better. With that being said there is some necrotic bone noted I would like to take a sample of this to send for pathology and culture so that we know how to treat everything here. He does have an infectious disease referral next week on Tuesday. Subsequently that we will give them something to go off of as well as far as figuring out the best treatment course going forward. 05/11/2021 upon evaluation today patient appears to be doing well with regard to his wound especially compared to last week. Fortunately there does not appear to be any signs of active infection which is great news. No fevers, chills, nausea, vomiting, or diarrhea. The patient does have still a fairly significant wound they are going to start him on IV antibiotic therapy. He did have Proteus and Pseudomonas noted on his culture. He also had acute osteomyelitis noted on the pathology report from the bone biopsy. Patient did see Dr. Odette Fraction who after reviewing the wound as well as the testing that have been performed as noted above started the patient on doxycycline, Levaquin, and metronidazole as an outpatient and to he get set up for the PICC line. Once the PICC line is established he will be taking daptomycin along with cefepime and then still the oral metronidazole. 05/18/2009 upon evaluation today patient appears to be doing well with regard to the wound we have been taking care of. With that being said unfortunately he has 2 new areas on the left trochanter and left ischial location. With that being said I do feel like that the patient unfortunately is having this happened as a result of sitting for too long a period of time. I discussed that with  him today and I do believe he needs to be more cognizant of offloading in this regard. Again this is not the first probably discussed this to be honest but again I felt the need to reiterate today based on what we are seeing. Fortunately there does not appear to be any signs of active infection at this time. No fever chills noted. Objective Constitutional Well-nourished and well-hydrated in no acute distress. Vitals Time Taken: 2:52 PM, Height: 71 in, Weight: 136 lbs, BMI: 19, Temperature: 99 F, Pulse: 102 bpm, Respiratory Rate: 16 breaths/min, Blood Pressure: 124/77 mmHg. Respiratory normal breathing without difficulty. Psychiatric this patient is able to make decisions and demonstrates good insight into disease process. Alert and Oriented x 3. pleasant and cooperative. General Notes: Upon inspection patient's  original wound that we have been taking care of more recently actually appears to be doing quite well to clean it is large but nonetheless it is making some progress here. With regard to the new areas these are fairly superficial and hopeful that if we can just protect the area and keep it clean and dry and again offload appropriately meaning do not sit for any longer than 2 hours maximum we can get this to heal. Nonetheless if not I am afraid he is going to develop more significant wounds in this location as well. Integumentary (Hair, Skin) Wound #6 status is Open. Original cause of wound was Trauma. The date acquired was: 02/10/2021. The wound has been in treatment 13 weeks. The wound is located on the Right Ischium. The wound measures 7.5cm length x 4.2cm width x 3.2cm depth; 24.74cm^2 area and 79.168cm^3 volume. There is bone, tendon, and Fat Layer (Subcutaneous Tissue) exposed. Tunneling has been noted at 12:00 with a maximum distance of 4.5cm. Undermining begins at 1:00 and ends at 5:00 with a maximum distance of 2cm. There is a large amount of purulent drainage noted. The wound  margin is well defined and not attached to the wound base. There is large (67-100%) red, pink granulation within the wound bed. There is no necrotic tissue within the wound bed. Wound #8 status is Open. Original cause of wound was Pressure Injury. The date acquired was: 05/09/2021. The wound is located on the Left Trochanter. The wound measures 0.7cm length x 0.7cm width x 0.1cm depth; 0.385cm^2 area and 0.038cm^3 volume. There is Fat Layer (Subcutaneous Tissue) exposed. There is no tunneling or undermining noted. There is a medium amount of serosanguineous drainage noted. The wound margin is distinct with the outline attached to the wound base. There is large (67-100%) red granulation within the wound bed. There is a small (1-33%) amount of necrotic tissue within the wound bed including Adherent Slough. Wound #9 status is Open. Original cause of wound was Pressure Injury. The date acquired was: 05/09/2021. The wound is located on the Left Ischium. The wound measures 1.7cm length x 1.7cm width x 0.1cm depth; 2.27cm^2 area and 0.227cm^3 volume. There is Fat Layer (Subcutaneous Tissue) exposed. There is no tunneling or undermining noted. There is a medium amount of serosanguineous drainage noted. The wound margin is distinct with the outline attached to the wound base. There is large (67-100%) red granulation within the wound bed. There is no necrotic tissue within the wound bed. Assessment Active Problems ICD-10 Pressure ulcer of sacral region, stage 4 Pressure ulcer of left ankle, stage 3 Pressure ulcer of right buttock, unstageable Unspecified severe protein-calorie malnutrition Paraplegia, complete Other chronic osteomyelitis, other site Plan Follow-up Appointments: Return Appointment in 1 week. Bathing/ Shower/ Hygiene: May shower with protection but do not get wound dressing(s) wet. Off-Loading: Turn and reposition every 2 hours - be sure to lift up off the chair with arms every hour  while in wheelchair Other: - pillows under left calf to keep pressure of left lateral ankle Non Wound Condition: Protect area with: - Protect sacrum with vaseline or zinc oxide The following medication(s) was prescribed: Dakin's Solution miscellaneous 0.25 % solution Moisten gauze with Dakin's solution then wring out leaving gauze damp not saturated before packing into the wound bed daily starting 05/18/2021 WOUND #6: - Ischium Wound Laterality: Right Cleanser: Normal Saline 1 x Per Day/30 Days Discharge Instructions: Cleanse the wound with Normal Saline prior to applying a clean dressing using gauze sponges, not  tissue or cotton balls. Prim Dressing: Dakin's Solution 0.25%, 16 (oz) 1 x Per Day/30 Days ary Discharge Instructions: Moisten gauze with Dakin's solution and pack lightly into wound Secondary Dressing: ABD Pad, 5x9 1 x Per Day/30 Days Discharge Instructions: Apply over primary dressing as directed. Secured With: 10M Medipore H Soft Cloth Surgical T 4 x 2 (in/yd) 1 x Per Day/30 Days ape Discharge Instructions: Secure dressing with tape as directed. WOUND #8: - Trochanter Wound Laterality: Left Prim Dressing: KerraCel Ag Gelling Fiber Dressing, 4x5 in (silver alginate) ary Discharge Instructions: Apply silver alginate to wound bed as instructed Secondary Dressing: Zetuvit Plus Silicone Border Dressing 4x4 (in/in) Discharge Instructions: Apply silicone border over primary dressing as directed. WOUND #9: - Ischium Wound Laterality: Left Prim Dressing: KerraCel Ag Gelling Fiber Dressing, 4x5 in (silver alginate) ary Discharge Instructions: Apply silver alginate to wound bed as instructed Secondary Dressing: Zetuvit Plus Silicone Border Dressing 4x4 (in/in) Discharge Instructions: Apply silicone border over primary dressing as directed. 1. Would recommend currently that we continue with Dakin's moistened gauze in regard to the main wound here. He is in agreement with the plan and again  that is for the right ischium in particular. 2. In regard to the left trochanter and ischial location I think the ideal thing here is probably can be for [place] to go ahead and get him started with a silver alginate dressing and aggressively offload to keep pressure off of these regions. 3. I am going to suggest as well that the patient needs to be extremely cognizant of how he is offloading and if he notices that he sitting for 2 hours or longer he needs to do something to get in the bed in order to offload and take some pressure off of the area. He voiced understanding. 4. He does have his PICC line in place as of today therefore the IV antibiotics have been started this is great news. That is daptomycin and cefepime along with oral Flagyl. We will see patient back for reevaluation in 1 week here in the clinic. If anything worsens or changes patient will contact our office for additional recommendations. Electronic Signature(s) Signed: 05/18/2021 3:37:01 PM By: Lenda Kelp PA-C Previous Signature: 05/18/2021 3:32:01 PM Version By: Lenda Kelp PA-C Entered By: Lenda Kelp on 05/18/2021 15:37:01 -------------------------------------------------------------------------------- SuperBill Details Patient Name: Date of Service: JEFRY, LESINSKI 05/18/2021 Medical Record Number: [id number] Patient Account Number: [id number] Date of Birth/Sex: Treating RN: [birth year] (29 y.o. [name] Primary Care Provider: PCP, NO Other Clinician: Referring Provider: Treating Provider/Extender: [name] in Treatment: 41 Diagnosis Coding ICD-10 Codes Code Description L89.154 Pressure ulcer of sacral region, stage 4 L89.523 Pressure ulcer of left ankle, stage 3 L89.310 Pressure ulcer of right buttock, unstageable E43 Unspecified severe protein-calorie malnutrition G82.21 Paraplegia, complete M86.68 Other chronic osteomyelitis, other site Facility  Procedures CPT4 Code: [id number] Description: 99214 - WOUND CARE VISIT-LEV 4 EST PT Modifier: Quantity: 1 Physician Procedures Electronic Signature(s) Signed: 05/18/2021 3:37:15 PM By: Lenda Kelp PA-C Previous Signature: 05/18/2021 3:32:15 PM Version By: Lenda Kelp PA-C Entered By: Lenda Kelp on 05/18/2021 15:37:14

## 2021-05-20 ENCOUNTER — Other Ambulatory Visit: Payer: Self-pay

## 2021-05-20 ENCOUNTER — Encounter: Payer: Self-pay | Admitting: Physical Medicine and Rehabilitation

## 2021-05-20 ENCOUNTER — Encounter
Payer: Medicaid Other | Attending: Physical Medicine and Rehabilitation | Admitting: Physical Medicine and Rehabilitation

## 2021-05-20 VITALS — BP 125/72 | HR 80 | Temp 98.8°F | Ht 72.0 in | Wt 127.9 lb

## 2021-05-20 DIAGNOSIS — G8220 Paraplegia, unspecified: Secondary | ICD-10-CM | POA: Insufficient documentation

## 2021-05-20 DIAGNOSIS — N319 Neuromuscular dysfunction of bladder, unspecified: Secondary | ICD-10-CM | POA: Insufficient documentation

## 2021-05-20 DIAGNOSIS — L89314 Pressure ulcer of right buttock, stage 4: Secondary | ICD-10-CM | POA: Diagnosis present

## 2021-05-20 DIAGNOSIS — R29818 Other symptoms and signs involving the nervous system: Secondary | ICD-10-CM | POA: Insufficient documentation

## 2021-05-20 DIAGNOSIS — Z993 Dependence on wheelchair: Secondary | ICD-10-CM | POA: Insufficient documentation

## 2021-05-20 DIAGNOSIS — M898X9 Other specified disorders of bone, unspecified site: Secondary | ICD-10-CM | POA: Insufficient documentation

## 2021-05-20 MED ORDER — OXYCODONE HCL 10 MG PO TABS: 10.0000 mg | ORAL_TABLET | Freq: Four times a day (QID) | ORAL | 0 refills | Status: DC | PRN

## 2021-05-20 NOTE — Patient Instructions (Signed)
Pt is a 30 yr old male with T10 ASIA S paraplegia, neurogenic bowel and bladder and back pain here  For f/u- also had DVT and Pressure ulcer- sacrum- Heels have healed. 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 Here for f/u on SCI/paraplegia.   Cath supplies- needs in/out caths 4x/day as well as condom catheters- and leg bags- will try to order from [facility].  Still get f/u from Urology-[phone]  3. Has Rx's for tramadol and 5 refills  4. Needs refill of Oxycodone 10 mg q6 hours prn- #75 tabs just in case tramadol not enough- will refill early so pt can pick up while I'm on vacation- so still due 05/31/21.   5. 4 months for sending ot Ortho in [facility] for HO surgery- needs to mature  - will refer at next appointment.   6. Con't Zanaflex- 8 mg BID Baclofen 40 mg QID; and hydroxyzine 10 mg QHS, and pain meds as above.    7. F/U in 2-3 months- double appointment.   8. Don't miss [facility] appointment-
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
## 2021-05-24 ENCOUNTER — Telehealth: Payer: Self-pay | Admitting: *Deleted

## 2021-05-24 NOTE — Telephone Encounter (Signed)
Order faxed to [facility] supplies with a face sheet and clinical notes for 16 fr #60 for 2 x day  in and out cath per Dr Berline Chough.

## 2021-05-25 ENCOUNTER — Encounter (HOSPITAL_BASED_OUTPATIENT_CLINIC_OR_DEPARTMENT_OTHER): Payer: Medicaid Other | Admitting: Physician Assistant

## 2021-05-26 ENCOUNTER — Telehealth: Payer: Self-pay

## 2021-05-26 NOTE — Telephone Encounter (Signed)
Spoke with Eunice Blase at [facility] to request most recent labs be faxed to triage.   Sandie Ano, RN

## 2021-05-27 ENCOUNTER — Ambulatory Visit: Payer: Medicaid Other | Admitting: Infectious Diseases

## 2021-05-31 ENCOUNTER — Telehealth: Payer: Self-pay | Admitting: *Deleted

## 2021-05-31 NOTE — Telephone Encounter (Signed)
Received message from [facility] at [facility]. Patient reports whole body aches and feeling poorly overall.  Per Corrie Dandy, patient reports temperatures of 103-104 at home. She confirmed he means 103-104, not 100.3-100.4 Corrie Dandy is advising patient to go to the ER. Andree Coss, RN

## 2021-06-01 ENCOUNTER — Encounter (HOSPITAL_BASED_OUTPATIENT_CLINIC_OR_DEPARTMENT_OTHER): Payer: Medicaid Other | Admitting: Physician Assistant

## 2021-06-02 NOTE — Telephone Encounter (Signed)
Per Corrie Dandy at [facility], patient did not go to ER as directed. Corrie Dandy states that home health would not be able to collect the blood cultures. RN spoke with patient. He stated that he wanted to see how the fevers went that night. He stopped the daptomycin on 7/3 and feels the fevers have gotten progressively better each day since stopping the meds. He is still taking metronidazole and his other IV antibiotic as directed. Patient is following up with [facility] 06/03/21. Andree Coss, RN

## 2021-06-03 ENCOUNTER — Encounter: Payer: Self-pay | Admitting: Infectious Diseases

## 2021-06-03 ENCOUNTER — Ambulatory Visit (INDEPENDENT_AMBULATORY_CARE_PROVIDER_SITE_OTHER): Payer: Medicaid Other | Admitting: Infectious Diseases

## 2021-06-03 ENCOUNTER — Telehealth: Payer: Self-pay

## 2021-06-03 ENCOUNTER — Other Ambulatory Visit: Payer: Self-pay

## 2021-06-03 VITALS — BP 139/78 | HR 80 | Temp 98.2°F

## 2021-06-03 DIAGNOSIS — Z95828 Presence of other vascular implants and grafts: Secondary | ICD-10-CM | POA: Diagnosis not present

## 2021-06-03 DIAGNOSIS — R509 Fever, unspecified: Secondary | ICD-10-CM | POA: Diagnosis not present

## 2021-06-03 DIAGNOSIS — M869 Osteomyelitis, unspecified: Secondary | ICD-10-CM

## 2021-06-03 DIAGNOSIS — M86151 Other acute osteomyelitis, right femur: Secondary | ICD-10-CM | POA: Diagnosis not present

## 2021-06-03 MED ORDER — METRONIDAZOLE 500 MG PO TABS: 500.0000 mg | ORAL_TABLET | Freq: Two times a day (BID) | ORAL | 0 refills | Status: DC

## 2021-06-03 MED ORDER — DOXYCYCLINE HYCLATE 100 MG PO TABS: 100.0000 mg | ORAL_TABLET | Freq: Two times a day (BID) | ORAL | 0 refills | Status: DC

## 2021-06-03 NOTE — Patient Instructions (Addendum)
CONTINUE the intravenous Cefepime  START Doxycycline one pill twice a day with food and a full glass of water  START Metronidazole one pill twice a day with food   Please make your wound appointment with Dr. Leonard Schwartz next Wednesday - I will get in touch with him to get an update from him also, but it sounds like your wound is doing very well on treatment which is good.   Please call if you have any further fevers at all > 100.4 F   Will see you back in 3 weeks

## 2021-06-03 NOTE — Telephone Encounter (Signed)
[name] with [facility] called back stating patient will have to pay $59 out of pocket for TPA. Patient stated he would rather have a peripheral stick instead of paying $59 . Per Judeth Cornfield it is fine for patient to have peripheral stick.  I have called [facility] back and spoke to [facility] and advised her that patient would rather do peripheral stick instead. Heather verbalized understanding. Dezyre Hoefer T Pricilla Loveless
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
## 2021-06-03 NOTE — Telephone Encounter (Signed)
Called [facility] to report verbal orders per Rexene Alberts, FNP - stop Daptomycin, continue Cefepime until 06/28/2021. Patient will need TPA through catheter per Rexene Alberts, FNP. Verbal orders given to [facility]. Per Corrie Dandy - nursing will be performing TPA in patient's home.    Kristal Perl Lesli Albee, CMA

## 2021-06-04 DIAGNOSIS — R509 Fever, unspecified: Secondary | ICD-10-CM | POA: Insufficient documentation

## 2021-06-04 DIAGNOSIS — Z95828 Presence of other vascular implants and grafts: Secondary | ICD-10-CM | POA: Insufficient documentation

## 2021-06-04 NOTE — Assessment & Plan Note (Signed)
Given this does not draw back blood will request home health to initiate TPA protocol at home to see if we can salvage it and get him to do blood draws.   ADDENDUM: due to the cost of TPA [name] declined this and accepted peripheral sticks for safety labs at home.

## 2021-06-04 NOTE — Assessment & Plan Note (Addendum)
The cause of his fever is unclear and seems to have resolved.  Today in clinic he is well appearing with normal vital signs. PICC line looks good, though he reports irritation from what sounds like dressing material. With this in place will check peripheral blood cultures today and remove line if positive.   States that fevers have resolved since stopping Daptomycin >> no rash or respiratory symptoms to suggest hypersensitivity reaction or eosinophilic pneumonia. No need for CXR today. Check CBC with differential today and CMP. Not sure if this really has anything to do with the fevers as it seems to be a very infrequent s/e to therapy (4%) but possible I suppose.  He declines to continue it as he is fearful of this making him feel badly   Will need to self cath at home if we need urine and have HH collect this if his fevers return. With report of rusty urine ?elevated CK in the setting of daptomycin >> will check CK today. He is eating and drinking well.   Does not sound like the fever is coming form wound as this has been improving on treatment with resolution of drainage from problem area overlying right ischium. He has missed several wound clinic follow ups due to transportation problems. He assured me that he will go ahead and arrange transportation for his next visit scheduled on 7/13. I will reach out to [facility] to see what he thinks about the progress surrounding this wound.   Given he appears well, fevers reported to resolve and no signs of SIRS today I feel he is safe to resume care at home with close follow up with [facility] team.   Further recommendations pending results of ordered tests.

## 2021-06-04 NOTE — Assessment & Plan Note (Signed)
Bone cultures with proteus mirabilis and pseudomonas aerignosa and corynebacterium striatum. I do not want him on vancomycin due to toxicity risk if not well monitored with twice weekly blood work. Unable to do linezolid due to DDIs. Not many great options for this. Will continue Cefepime to target the two heavier growers from the wound. Doxy (most likely will be resistant, but small chance it could be active) + metronidazole PO to finish out 3 weeks.  CRP / ESR today (they have been high since September 2021).   Wound appears to be doing well - as outlined will contact his wound provider to get his take on progress at his OV next week for better assessment. Hopefully we can treat his bone infection and get a VAC on this to help granulation over bone and keep it clean going forward.

## 2021-06-06 ENCOUNTER — Telehealth: Payer: Self-pay

## 2021-06-06 NOTE — Telephone Encounter (Signed)
Patient contacted to inquire about symptoms. Patient reports no fevers but occasional body aches. Instructed patient to contact our office with any fevers or generally feeling unwell.   Aima Mcwhirt Loyola Mast, RN

## 2021-06-06 NOTE — Telephone Encounter (Signed)
[facility] called to report critical lab values of blood cultures from 7/8.   Please review results.  Auria Mckinlay Loyola Mast, RN

## 2021-06-08 ENCOUNTER — Other Ambulatory Visit: Payer: Self-pay

## 2021-06-08 ENCOUNTER — Encounter (HOSPITAL_BASED_OUTPATIENT_CLINIC_OR_DEPARTMENT_OTHER): Payer: Medicaid Other | Attending: Physician Assistant | Admitting: Physician Assistant

## 2021-06-08 DIAGNOSIS — L89523 Pressure ulcer of left ankle, stage 3: Secondary | ICD-10-CM | POA: Insufficient documentation

## 2021-06-08 DIAGNOSIS — Z7901 Long term (current) use of anticoagulants: Secondary | ICD-10-CM | POA: Insufficient documentation

## 2021-06-08 DIAGNOSIS — Z86718 Personal history of other venous thrombosis and embolism: Secondary | ICD-10-CM | POA: Diagnosis not present

## 2021-06-08 DIAGNOSIS — G8221 Paraplegia, complete: Secondary | ICD-10-CM | POA: Insufficient documentation

## 2021-06-08 DIAGNOSIS — L89320 Pressure ulcer of left buttock, unstageable: Secondary | ICD-10-CM | POA: Diagnosis not present

## 2021-06-08 DIAGNOSIS — E43 Unspecified severe protein-calorie malnutrition: Secondary | ICD-10-CM | POA: Insufficient documentation

## 2021-06-08 DIAGNOSIS — L89154 Pressure ulcer of sacral region, stage 4: Secondary | ICD-10-CM | POA: Insufficient documentation

## 2021-06-08 DIAGNOSIS — L89314 Pressure ulcer of right buttock, stage 4: Secondary | ICD-10-CM | POA: Insufficient documentation

## 2021-06-08 DIAGNOSIS — L89223 Pressure ulcer of left hip, stage 3: Secondary | ICD-10-CM | POA: Diagnosis not present
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
## 2021-06-08 NOTE — Progress Notes (Addendum)
CHAYIM, BIALAS ([id number]) Visit Report for 06/08/2021 Arrival Information Details Patient Name: Date of Service: COSIMO, SCHERTZER 06/08/2021 1:30 PM Medical Record Number: [id number] Patient Account Number: [id number] Date of Birth/Sex: Treating RN: 08-May-1991 (29 y.o. [name], [name] Primary Care Danicia Terhaar: PCP, NO Other Clinician: Referring Shondell Fabel: Treating Karla Pavone/Extender: [name] in Treatment: 44 Visit Information History Since Last Visit Added or deleted any medications: No Patient Arrived: Wheel Chair Any new allergies or adverse reactions: No Arrival Time: 14:00 Had a fall or experienced change in No Accompanied By: self activities of daily living that may affect Transfer Assistance: Manual risk of falls: Patient Identification Verified: Yes Signs or symptoms of abuse/neglect since last visito No Secondary Verification Process Completed: Yes Hospitalized since last visit: No Patient Requires Transmission-Based Precautions: No Implantable device outside of the clinic excluding No Patient Has Alerts: No cellular tissue based products placed in the center since last visit: Has Dressing in Place as Prescribed: Yes Pain Present Now: No Electronic Signature(s) Signed: 06/08/2021 6:29:13 PM By: Shawn Stall Entered By: Shawn Stall on 06/08/2021 14:10:05 -------------------------------------------------------------------------------- Encounter Discharge Information Details Patient Name: Date of Service: [name] 06/08/2021 1:30 PM Medical Record Number: [id number] Patient Account Number: [id number] Date of Birth/Sex: Treating RN: 10/13/1991 (29 y.o. [name] Primary Care Irwin Toran: PCP, NO Other Clinician: Referring Brooklee Michelin: Treating Guss Farruggia/Extender: [name] in Treatment: [phone] Encounter Discharge Information Items Post Procedure Vitals Discharge Condition: Stable Temperature  (F): 98.8 Ambulatory Status: Wheelchair Pulse (bpm): 94 Discharge Destination: Home Respiratory Rate (breaths/min): 20 Transportation: Private Auto Blood Pressure (mmHg): 132/80 Schedule Follow-up Appointment: Yes Clinical Summary of Care: Provided on 06/08/2021 Form Type Recipient Paper Patient Patient Electronic Signature(s) Signed: 06/08/2021 2:48:44 PM By: Antonieta Iba Entered By: Antonieta Iba on 06/08/2021 14:48:44 -------------------------------------------------------------------------------- Lower Extremity Assessment Details Patient Name: Date of Service: VOLNEY, REIERSON NIO L. 06/08/2021 1:30 PM Medical Record Number: [id number] Patient Account Number: [id number] Date of Birth/Sex: Treating RN: 1991-10-28 (29 y.o. [name] Primary Care Harout Scheurich: PCP, NO Other Clinician: Referring Tenessa Marsee: Treating Catelin Manthe/Extender: Angus Palms Weeks in Treatment: 44 Electronic Signature(s) Signed: 06/08/2021 6:29:13 PM By: Shawn Stall Entered By: Shawn Stall on 06/08/2021 14:10:37 -------------------------------------------------------------------------------- Multi-Disciplinary Care Plan Details Patient Name: Date of Service: SASCHA, BAUGHER NTO NIO L. 06/08/2021 1:30 PM Medical Record Number: [id number] Patient Account Number: [id number] Date of Birth/Sex: Treating RN: 1991/11/23 (29 y.o. [name] Primary Care Charlene Cowdrey: PCP, NO Other Clinician: Referring Anayla Giannetti: Treating Chasiti Waddington/Extender: [name] in Treatment: 44 Multidisciplinary Care Plan reviewed with physician Active Inactive Pressure Nursing Diagnoses: Knowledge deficit related to causes and risk factors for pressure ulcer development Goals: Patient/caregiver will verbalize risk factors for pressure ulcer development Date Initiated: 07/30/2020 Target Resolution Date: 07/13/2021 Goal Status: Active Interventions: Provide education on pressure  ulcers Notes: 06/08/21: Patient recently had new pressure areas, target date extended. Wound/Skin Impairment Nursing Diagnoses: Impaired tissue integrity Goals: Patient/caregiver will verbalize understanding of skin care regimen Date Initiated: 01/05/2021 Target Resolution Date: 07/13/2021 Goal Status: Active Ulcer/skin breakdown will have a volume reduction of 50% by week 8 Date Initiated: 07/30/2020 Date Inactivated: 10/20/2020 Target Resolution Date: 09/27/2020 Goal Status: Unmet Unmet Reason: Infection Ulcer/skin breakdown will have a volume reduction of 80% by week 12 Date Initiated: 10/20/2020 Date Inactivated: 12/22/2020 Target Resolution Date: 11/19/2020 Unmet Reason: paraplegic, difficulty Goal Status: Unmet offloading Interventions: Provide education on ulcer and skin care Notes: 06/08/21:  wound care ongoing. Electronic Signature(s) Signed: 06/08/2021 2:19:21 PM By: Antonieta IbaBarnhart, Jodi Entered By: Antonieta IbaBarnhart, Jodi on 06/08/2021 14:19:21 -------------------------------------------------------------------------------- Pain Assessment Details Patient Name: Date of Service: [name], [name] 06/08/2021 1:30 PM Medical Record Number: [id number] Patient Account Number: [id number] Date of Birth/Sex: Treating RN: 12/31/1990 (29 y.o. Tammy SoursM) Deaton, Bobbi Primary Care Darey Hershberger: PCP, NO Other Clinician: Referring Daya Dutt: Treating Tieler Cournoyer/Extender: Albertine GratesStone III, Hoyt [name], [name] Weeks in Treatment: 44 Active Problems Location of Pain Severity and Description of Pain Patient Has Paino No Site Locations Rate the pain. Current Pain Level: 0 Pain Management and Medication Current Pain Management: Medication: No Cold Application: No Rest: No Massage: No Activity: No T.E.N.S.: No Heat Application: No Leg drop or elevation: No Is the Current Pain Management Adequate: Adequate How does your wound impact your activities of daily livingo Sleep: No Bathing: No Appetite:  No Relationship With Others: No Bladder Continence: No Emotions: No Bowel Continence: No Work: No Toileting: No Drive: No Dressing: No Hobbies: No Electronic Signature(s) Signed: 06/08/2021 6:29:13 PM By: Shawn Stalleaton, Bobbi Entered By: Shawn Stalleaton, Bobbi on 06/08/2021 14:10:32 -------------------------------------------------------------------------------- Patient/Caregiver Education Details Patient Name: Date of Service: Lauro RegulusUSSELL, [name] 7/13/2022andnbsp1:30 PM Medical Record Number: [id number] Patient Account Number: [id number] Date of Birth/Gender: Treating RN: 05/23/1991 (29 y.o. Lytle MichaelsM) Barnhart, Jodi Primary Care Physician: PCP, NO Other Clinician: Referring Physician: Treating Physician/Extender: Albertine GratesStone III, Hoyt [name], [name] Weeks in Treatment: 2644 Education Assessment Education Provided To: Patient Education Topics Provided Pressure: Methods: Explain/Verbal, Printed Responses: State content correctly Wound/Skin Impairment: Methods: Explain/Verbal, Printed Responses: State content correctly Electronic Signature(s) Signed: 06/08/2021 6:05:01 PM By: Antonieta IbaBarnhart, Jodi Entered By: Antonieta IbaBarnhart, Jodi on 06/08/2021 14:19:44 -------------------------------------------------------------------------------- Wound Assessment Details Patient Name: Date of Service: [name], [name] 06/08/2021 1:30 PM Medical Record Number: [id number] Patient Account Number: [id number] Date of Birth/Sex: Treating RN: 05/15/1991 (29 y.o. Tammy SoursM) Deaton, Bobbi Primary Care Fowler Antos: PCP, NO Other Clinician: Referring Leyan Branden: Treating Dreyson Mishkin/Extender: Angus PalmsStone III, Hoyt [name], [name] Weeks in Treatment: 44 Wound Status Wound Number: 6 Primary Etiology: Pressure Ulcer Wound Location: Right Ischium Wound Status: Open Wounding Event: Trauma Comorbid History: Paraplegia Date Acquired: 02/10/2021 Weeks Of Treatment: 16 Clustered Wound: No Photos Wound Measurements Length: (cm) 7.3 Width: (cm)  3.8 Depth: (cm) 2.8 Area: (cm) 21.787 Volume: (cm) 61.003 % Reduction in Area: -42.3% % Reduction in Volume: -3881.9% Epithelialization: None Tunneling: No Undermining: Yes Location 1 Starting Position (o'clock): 5 Ending Position (o'clock): 7 Maximum Distance: (cm) 2.8 Location 2 Starting Position (o'clock): 11 Ending Position (o'clock): 2 Maximum Distance: (cm) 4.4 Wound Description Classification: Category/Stage IV Wound Margin: Well defined, not attached Exudate Amount: Medium Exudate Type: Purulent Exudate Color: yellow, brown, green Foul Odor After Cleansing: No Slough/Fibrino Yes Wound Bed Granulation Amount: Large (67-100%) Exposed Structure Granulation Quality: Red, Pink Fascia Exposed: No Necrotic Amount: Small (1-33%) Fat Layer (Subcutaneous Tissue) Exposed: Yes Necrotic Quality: Adherent Slough Tendon Exposed: Yes Muscle Exposed: No Joint Exposed: No Bone Exposed: Yes Treatment Notes Wound #6 (Ischium) Wound Laterality: Right Cleanser Soap and Water Discharge Instruction: May shower and wash wound with dial antibacterial soap and water prior to dressing change. Wound Cleanser Discharge Instruction: Cleanse the wound with wound cleanser prior to applying a clean dressing using gauze sponges, not tissue or cotton balls. Peri-Wound Care Topical Primary Dressing Dakin's Solution 0.25%, 16 (oz) Discharge Instruction: Moisten gauze with Dakin's solution and pack lightly into wound Secondary Dressing Woven Gauze Sponge, Non-Sterile 4x4 in Discharge Instruction: Apply over primary dressing as directed. ABD Pad, 5x9  Discharge Instruction: Apply over primary dressing as directed. Secured With 59M Medipore H Soft Cloth Surgical T 4 x 2 (in/yd) ape Discharge Instruction: Secure dressing with tape as directed. Compression Wrap Compression Stockings Add-Ons Electronic Signature(s) Signed: 06/09/2021 6:14:19 PM By: Shawn Stall Signed: 06/13/2021 3:49:49 PM  By: [name] Ito Previous Signature: 06/08/2021 6:29:13 PM Version By: Shawn Stall Entered By: [name] Ito on 06/09/2021 09:19:44 -------------------------------------------------------------------------------- Wound Assessment Details Patient Name: Date of Service: ANTONIOUS, OMAHONEY NTO NIO L. 06/08/2021 1:30 PM Medical Record Number: [id number] Patient Account Number: [id number] Date of Birth/Sex: Treating RN: [birth year] (29 y.o. [name] Primary Care Latesa Fratto: PCP, NO Other Clinician: Referring Luvinia Lucy: Treating Laquasha Groome/Extender: Angus Palms Weeks in Treatment: 44 Wound Status Wound Number: 8 Primary Etiology: Pressure Ulcer Wound Location: Left Trochanter Wound Status: Open Wounding Event: Pressure Injury Comorbid History: Paraplegia Date Acquired: 05/09/2021 Weeks Of Treatment: 3 Clustered Wound: No Photos Wound Measurements Length: (cm) 4 Width: (cm) 4.5 Depth: (cm) 0.1 Area: (cm) 14.137 Volume: (cm) 1.414 % Reduction in Area: -3571.9% % Reduction in Volume: -3621.1% Epithelialization: None Tunneling: No Undermining: No Wound Description Classification: Unstageable/Unclassified Wound Margin: Distinct, outline attached Exudate Amount: Small Exudate Type: Serosanguineous Exudate Color: red, brown Foul Odor After Cleansing: No Slough/Fibrino Yes Wound Bed Granulation Amount: None Present (0%) Exposed Structure Necrotic Amount: Large (67-100%) Fascia Exposed: No Necrotic Quality: Eschar, Adherent Slough Fat Layer (Subcutaneous Tissue) Exposed: Yes Tendon Exposed: No Muscle Exposed: No Joint Exposed: No Bone Exposed: No Treatment Notes Wound #8 (Trochanter) Wound Laterality: Left Cleanser Soap and Water Discharge Instruction: May shower and wash wound with dial antibacterial soap and water prior to dressing change. Wound Cleanser Discharge Instruction: Cleanse the wound with wound cleanser prior to applying a clean  dressing using gauze sponges, not tissue or cotton balls. Peri-Wound Care Topical Primary Dressing Santyl Ointment Discharge Instruction: Apply nickel thick amount to wound bed as instructed Secondary Dressing Woven Gauze Sponge, Non-Sterile 4x4 in Discharge Instruction: Apply over primary dressing as directed. Zetuvit Plus Silicone Border Dressing 4x4 (in/in) Discharge Instruction: Apply silicone border over primary dressing as directed. Secured With Compression Wrap Compression Stockings Add-Ons Electronic Signature(s) Signed: 06/09/2021 6:14:19 PM By: Shawn Stall Signed: 06/13/2021 3:49:49 PM By: [name] Ito Previous Signature: 06/08/2021 6:29:13 PM Version By: Shawn Stall Entered By: [name] Ito on 06/09/2021 09:18:59 -------------------------------------------------------------------------------- Wound Assessment Details Patient Name: Date of Service: TANDY, LEWIN NTO NIO L. 06/08/2021 1:30 PM Medical Record Number: [id number] Patient Account Number: [id number] Date of Birth/Sex: Treating RN: 06/02/1991 (29 y.o. [name] Primary Care Delio Slates: PCP, NO Other Clinician: Referring Edwards Mckelvie: Treating Pat Elicker/Extender: Angus Palms Weeks in Treatment: 44 Wound Status Wound Number: 9 Primary Etiology: Pressure Ulcer Wound Location: Left Ischium Wound Status: Open Wounding Event: Pressure Injury Comorbid History: Paraplegia Date Acquired: 05/09/2021 Weeks Of Treatment: 3 Clustered Wound: No Photos Wound Measurements Length: (cm) 2.3 Width: (cm) 2.8 Depth: (cm) 0.1 Area: (cm) 5.058 Volume: (cm) 0.506 % Reduction in Area: -122.8% % Reduction in Volume: -122.9% Epithelialization: None Tunneling: No Undermining: No Wound Description Classification: Category/Stage II Wound Margin: Distinct, outline attached Exudate Amount: Medium Exudate Type: Serosanguineous Exudate Color: red, brown Foul Odor After Cleansing:  No Slough/Fibrino No Wound Bed Granulation Amount: Small (1-33%) Exposed Structure Granulation Quality: Red Fascia Exposed: No Necrotic Amount: Large (67-100%) Fat Layer (Subcutaneous Tissue) Exposed: Yes Necrotic Quality: Adherent Slough Tendon Exposed: No Muscle Exposed: No Joint Exposed: No Bone Exposed: No Treatment Notes Wound #9 (Ischium) Wound Laterality: Left Cleanser  Soap and Water Discharge Instruction: May shower and wash wound with dial antibacterial soap and water prior to dressing change. Wound Cleanser Discharge Instruction: Cleanse the wound with wound cleanser prior to applying a clean dressing using gauze sponges, not tissue or cotton balls. Peri-Wound Care Topical Primary Dressing Santyl Ointment Discharge Instruction: Apply nickel thick amount to wound bed as instructed Secondary Dressing Woven Gauze Sponges 2x2 in Discharge Instruction: Apply over primary dressing as directed. Zetuvit Plus Silicone Border Dressing 4x4 (in/in) Discharge Instruction: Apply silicone border over primary dressing as directed. Secured With Compression Wrap Compression Stockings Add-Ons Electronic Signature(s) Signed: 06/09/2021 6:14:19 PM By: Shawn Stall Signed: 06/13/2021 3:49:49 PM By: [name] Ito Previous Signature: 06/08/2021 6:29:13 PM Version By: Shawn Stall Entered By: [name] Ito on 06/09/2021 09:21:02 -------------------------------------------------------------------------------- Vitals Details Patient Name: Date of Service: [name] NTO NIO L. 06/08/2021 1:30 PM Medical Record Number: [id number] Patient Account Number: [id number] Date of Birth/Sex: Treating RN: [birth year] (29 y.o. [name] Primary Care Carrolyn Hilmes: PCP, NO Other Clinician: Referring Stevie Charter: Treating Samanvitha Germany/Extender: [name] in Treatment: 44 Vital Signs Time Taken: 14:09 Temperature (F): 98.8 Height (in): 71 Pulse (bpm): 94 Weight  (lbs): 136 Respiratory Rate (breaths/min): 20 Body Mass Index (BMI): 19 Blood Pressure (mmHg): 132/80 Reference Range: 80 - 120 mg / dl Electronic Signature(s) Signed: 06/08/2021 6:29:13 PM By: Shawn Stall Entered By: Shawn Stall on 06/08/2021 14:10:22

## 2021-06-09 ENCOUNTER — Encounter: Payer: Self-pay | Admitting: Infectious Diseases

## 2021-06-09 LAB — CBC WITH DIFFERENTIAL/PLATELET
Absolute Monocytes: 569 cells/uL (ref 200–950)
Basophils Absolute: 22 cells/uL (ref 0–200)
Basophils Relative: 0.3 %
Eosinophils Absolute: 72 cells/uL (ref 15–500)
Eosinophils Relative: 1 %
HCT: 33 % — ABNORMAL LOW (ref 38.5–50.0)
Hemoglobin: 9.6 g/dL — ABNORMAL LOW (ref 13.2–17.1)
Lymphs Abs: 1591 cells/uL (ref 850–3900)
MCH: 20.5 pg — ABNORMAL LOW (ref 27.0–33.0)
MCHC: 29.1 g/dL — ABNORMAL LOW (ref 32.0–36.0)
MCV: 70.5 fL — ABNORMAL LOW (ref 80.0–100.0)
MPV: 8.9 fL (ref 7.5–12.5)
Monocytes Relative: 7.9 %
Neutro Abs: 4946 cells/uL (ref 1500–7800)
Neutrophils Relative %: 68.7 %
Platelets: 711 10*3/uL — ABNORMAL HIGH (ref 140–400)
RBC: 4.68 10*6/uL (ref 4.20–5.80)
RDW: 15.3 % — ABNORMAL HIGH (ref 11.0–15.0)
Total Lymphocyte: 22.1 %
WBC: 7.2 10*3/uL (ref 3.8–10.8)

## 2021-06-09 LAB — COMPLETE METABOLIC PANEL WITH GFR
AG Ratio: 0.7 (calc) — ABNORMAL LOW (ref 1.0–2.5)
ALT: 7 U/L — ABNORMAL LOW (ref 9–46)
AST: 9 U/L — ABNORMAL LOW (ref 10–40)
Albumin: 3.5 g/dL — ABNORMAL LOW (ref 3.6–5.1)
Alkaline phosphatase (APISO): 89 U/L (ref 36–130)
BUN/Creatinine Ratio: 19 (calc) (ref 6–22)
BUN: 11 mg/dL (ref 7–25)
CO2: 27 mmol/L (ref 20–32)
Calcium: 9.3 mg/dL (ref 8.6–10.3)
Chloride: 99 mmol/L (ref 98–110)
Creat: 0.57 mg/dL — ABNORMAL LOW (ref 0.60–1.35)
GFR, Est African American: 160 mL/min/{1.73_m2} (ref >=60)
GFR, Est Non African American: 138 mL/min/{1.73_m2} (ref >=60)
Globulin: 4.8 g/dL (calc) — ABNORMAL HIGH (ref 1.9–3.7)
Glucose, Bld: 89 mg/dL (ref 65–99)
Potassium: 4.1 mmol/L (ref 3.5–5.3)
Sodium: 136 mmol/L (ref 135–146)
Total Bilirubin: 0.3 mg/dL (ref 0.2–1.2)
Total Protein: 8.3 g/dL — ABNORMAL HIGH (ref 6.1–8.1)

## 2021-06-09 LAB — CULTURE, BLOOD (SINGLE): MICRO NUMBER:: 12098623

## 2021-06-09 LAB — C-REACTIVE PROTEIN: CRP: 163.9 mg/L — ABNORMAL HIGH (ref <8.0)

## 2021-06-09 LAB — SEDIMENTATION RATE: Sed Rate: 87 mm/h — ABNORMAL HIGH (ref 0–15)

## 2021-06-09 LAB — CK: Total CK: 68 U/L (ref 44–196)

## 2021-06-10 LAB — CULTURE, BLOOD (SINGLE)
MICRO NUMBER:: 12098624
SPECIMEN QUALITY:: ADEQUATE

## 2021-06-10 NOTE — Telephone Encounter (Signed)
Received final results from [facility] regarding blood culture.   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  Waylan Busta Loyola Mast, RN

## 2021-06-11 ENCOUNTER — Telehealth: Payer: Self-pay | Admitting: Infectious Disease

## 2021-06-11 NOTE — Telephone Encounter (Signed)
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  I will see if they can call me back with correct accession number

## 2021-06-13 ENCOUNTER — Ambulatory Visit: Payer: Self-pay | Admitting: Family Medicine

## 2021-06-13 ENCOUNTER — Ambulatory Visit: Payer: Self-pay | Admitting: Nurse Practitioner

## 2021-06-15 ENCOUNTER — Encounter (HOSPITAL_BASED_OUTPATIENT_CLINIC_OR_DEPARTMENT_OTHER): Payer: Medicaid Other | Admitting: Physician Assistant

## 2021-06-15 ENCOUNTER — Other Ambulatory Visit: Payer: Self-pay

## 2021-06-15 DIAGNOSIS — L89523 Pressure ulcer of left ankle, stage 3: Secondary | ICD-10-CM | POA: Diagnosis not present

## 2021-06-16 NOTE — Progress Notes (Signed)
[name] ([id number]) Visit Report for 06/15/2021 Arrival Information Details Patient Name: Date of Service: [name], [name] 06/15/2021 2:00 PM Medical Record Number: [id number] Patient Account Number: [id number] Date of Birth/Sex: Treating RN: Nov 27, 1991 (29 y.o. [name] Primary Care Nyko Gell: PCP, NO Other Clinician: Referring Cherrie Franca: Treating Kharon Hixon/Extender: [name] in Treatment: 45 Visit Information History Since Last Visit Added or deleted any medications: No Patient Arrived: Wheel Chair Any new allergies or adverse reactions: No Arrival Time: 14:07 Had a fall or experienced change in No Transfer Assistance: Manual activities of daily living that may affect Patient Identification Verified: Yes risk of falls: Secondary Verification Process Completed: Yes Signs or symptoms of abuse/neglect since last visito No Patient Requires Transmission-Based Precautions: No Hospitalized since last visit: No Patient Has Alerts: No Implantable device outside of the clinic excluding No cellular tissue based products placed in the center since last visit: Has Dressing in Place as Prescribed: Yes Pain Present Now: No Electronic Signature(s) Signed: 06/15/2021 5:31:35 PM By: Antonieta Iba Entered By: Antonieta Iba on 06/15/2021 14:07:39 -------------------------------------------------------------------------------- Clinic Level of Care Assessment Details Patient Name: Date of Service: [name], [name] 06/15/2021 2:00 PM Medical Record Number: [id number] Patient Account Number: [id number] Date of Birth/Sex: Treating RN: January 19, 1991 (29 y.o. [name] Primary Care Alesia Oshields: PCP, NO Other Clinician: Referring Woodrow Dulski: Treating Valen Gillison/Extender: [name] in Treatment: 45 Clinic Level of Care Assessment Items TOOL 4 Quantity Score []  - 0 Use when only an EandM is performed on FOLLOW-UP  visit ASSESSMENTS - Nursing Assessment / Reassessment X- 1 10 Reassessment of Co-morbidities (includes updates in patient status) X- 1 5 Reassessment of Adherence to Treatment Plan ASSESSMENTS - Wound and Skin A ssessment / Reassessment []  - 0 Simple Wound Assessment / Reassessment - one wound X- 3 5 Complex Wound Assessment / Reassessment - multiple wounds []  - 0 Dermatologic / Skin Assessment (not related to wound area) ASSESSMENTS - Focused Assessment []  - 0 Circumferential Edema Measurements - multi extremities []  - 0 Nutritional Assessment / Counseling / Intervention []  - 0 Lower Extremity Assessment (monofilament, tuning fork, pulses) []  - 0 Peripheral Arterial Disease Assessment (using hand held doppler) ASSESSMENTS - Ostomy and/or Continence Assessment and Care []  - 0 Incontinence Assessment and Management []  - 0 Ostomy Care Assessment and Management (repouching, etc.) PROCESS - Coordination of Care X - Simple Patient / Family Education for ongoing care 1 15 []  - 0 Complex (extensive) Patient / Family Education for ongoing care X- 1 10 Staff obtains , Records, T Results / Process Orders est []  - 0 Staff telephones HHA, Nursing Homes / Clarify orders / etc []  - 0 Routine Transfer to another Facility (non-emergent condition) []  - 0 Routine Hospital Admission (non-emergent condition) []  - 0 New Admissions / / Ordering NPWT Apligraf, etc. , []  - 0 Emergency Hospital Admission (emergent condition) X- 1 10 Simple Discharge Coordination []  - 0 Complex (extensive) Discharge Coordination PROCESS - Special Needs []  - 0 Pediatric / Minor Patient Management []  - 0 Isolation Patient Management []  - 0 Hearing / Language / Visual special needs []  - 0 Assessment of Community assistance (transportation, D/C planning, etc.) []  - 0 Additional assistance / Altered mentation []  - 0 Support Surface(s) Assessment (bed, cushion, seat,  etc.) INTERVENTIONS - Wound Cleansing / Measurement []  - 0 Simple Wound Cleansing - one wound X- 3 5 Complex Wound Cleansing - multiple wounds X- 1  5 Wound Imaging (photographs - any number of wounds)  - 0 Wound Tracing (instead of photographs)  - 0 Simple Wound Measurement - one wound X- 3 5 Complex Wound Measurement - multiple wounds INTERVENTIONS - Wound Dressings X - Small Wound Dressing one or multiple wounds 3 10  - 0 Medium Wound Dressing one or multiple wounds  - 0 Large Wound Dressing one or multiple wounds X- 1 5 Application of Medications - topical  - 0 Application of Medications - injection INTERVENTIONS - Miscellaneous  - 0 External ear exam  - 0 Specimen Collection (cultures, biopsies, blood, body fluids, etc.)  - 0 Specimen(s) / Culture(s) sent or taken to Lab for analysis  - 0 Patient Transfer (multiple staff / Nurse, adult / Similar devices)  - 0 Simple Staple / Suture removal (25 or less)  - 0 Complex Staple / Suture removal (26 or more)  - 0 Hypo / Hyperglycemic Management (close monitor of Blood Glucose)  - 0 Ankle / Brachial Index (ABI) - do not check if billed separately X- 1 5 Vital Signs Has the patient been seen at the hospital within the last three years: Yes Total Score: 140 Level Of Care: New/Established - Level 4 Electronic Signature(s) Signed: 06/15/2021 6:23:27 PM By: Zenaida Deed RN, BSN Entered By: Zenaida Deed on 06/15/2021 15:12:28 -------------------------------------------------------------------------------- Encounter Discharge Information Details Patient Name: Date of Service: [name] March L. 06/15/2021 2:00 PM Medical Record Number: [id number] Patient Account Number: [id number] Date of Birth/Sex: Treating RN: 1991-08-04 (29 y.o. [name] Primary Care Ellouise Mcwhirter: PCP, NO Other Clinician: Referring Britlee Skolnik: Treating Etsuko Dierolf/Extender: [name] in  Treatment: 45 Encounter Discharge Information Items Discharge Condition: Stable Ambulatory Status: Wheelchair Discharge Destination: Home Transportation: Private Auto Accompanied By: self Schedule Follow-up Appointment: Yes Clinical Summary of Care: Electronic Signature(s) Signed: 06/15/2021 6:09:09 PM By: Shawn Stall Entered By: Shawn Stall on 06/15/2021 17:29:56 -------------------------------------------------------------------------------- Lower Extremity Assessment Details Patient Name: Date of Service: RAYANSH, HERBST NIO L. 06/15/2021 2:00 PM Medical Record Number: [id number] Patient Account Number: [id number] Date of Birth/Sex: Treating RN: 28-Mar-1991 (29 y.o. [name] Primary Care Gentle Hoge: PCP, NO Other Clinician: Referring Rashan Rounsaville: Treating Sharay Bellissimo/Extender: [name] in Treatment: 45 Electronic Signature(s) Signed: 06/15/2021 5:31:35 PM By: Antonieta Iba Entered By: Antonieta Iba on 06/15/2021 14:12:31 -------------------------------------------------------------------------------- Multi-Disciplinary Care Plan Details Patient Name: Date of Service: [name] March L. 06/15/2021 2:00 PM Medical Record Number: [id number] Patient Account Number: [id number] Date of Birth/Sex: Treating RN: 11/04/1991 (29 y.o. [name] Primary Care Teaghan Formica: PCP, NO Other Clinician: Referring Corlette Ciano: Treating Ski Polich/Extender: [name] in Treatment: 45 Multidisciplinary Care Plan reviewed with physician Active Inactive Pressure Nursing Diagnoses: Knowledge deficit related to causes and risk factors for pressure ulcer development Goals: Patient/caregiver will verbalize risk factors for pressure ulcer development Date Initiated: 07/30/2020 Target Resolution Date: 07/13/2021 Goal Status: Active Interventions: Provide education on pressure ulcers Notes: 06/08/21: Patient recently had new pressure areas,  target date extended. Wound/Skin Impairment Nursing Diagnoses: Impaired tissue integrity Goals: Patient/caregiver will verbalize understanding of skin care regimen Date Initiated: 01/05/2021 Target Resolution Date: 07/13/2021 Goal Status: Active Ulcer/skin breakdown will have a volume reduction of 50% by week 8 Date Initiated: 07/30/2020 Date Inactivated: 10/20/2020 Target Resolution Date: 09/27/2020 Goal Status: Unmet Unmet Reason: Infection Ulcer/skin breakdown will have a volume reduction of 80% by week 12 Date Initiated: 10/20/2020 Date Inactivated: 12/22/2020 Target Resolution Date: 11/19/2020 Unmet Reason: paraplegic, difficulty  Goal Status: Unmet offloading Interventions: Provide education on ulcer and skin care Notes: 06/08/21: wound care ongoing. Electronic Signature(s) Signed: 06/15/2021 6:23:27 PM By: Zenaida Deed RN, BSN Entered By: Zenaida Deed on 06/15/2021 15:09:19 -------------------------------------------------------------------------------- Pain Assessment Details Patient Name: Date of Service: GUHAN, BRUINGTON NIO L. 06/15/2021 2:00 PM Medical Record Number: [id number] Patient Account Number: [id number] Date of Birth/Sex: Treating RN: [birth year] (29 y.o. [name] Primary Care Ramona Ruark: PCP, NO Other Clinician: Referring Mikailah Morel: Treating Ensley Blas/Extender: [name] in Treatment: 45 Active Problems Location of Pain Severity and Description of Pain Patient Has Paino No Site Locations Pain Management and Medication Current Pain Management: Electronic Signature(s) Signed: 06/15/2021 5:31:35 PM By: Antonieta Iba Entered By: Antonieta Iba on 06/15/2021 14:12:23 -------------------------------------------------------------------------------- Patient/Caregiver Education Details Patient Name: Date of Service: [name] 7/20/2022andnbsp2:00 PM Medical Record Number: [id number] Patient Account Number:  [id number] Date of Birth/Gender: Treating RN: [birth year] (29 y.o. [name] Primary Care Physician: PCP, NO Other Clinician: Referring Physician: Treating Physician/Extender: [name] in Treatment: 45 Education Assessment Education Provided To: Patient Education Topics Provided Pressure: Methods: Explain/Verbal Responses: Reinforcements needed, State content correctly Wound/Skin Impairment: Methods: Explain/Verbal Responses: Reinforcements needed, State content correctly Electronic Signature(s) Signed: 06/15/2021 6:23:27 PM By: Zenaida Deed RN, BSN Entered By: Zenaida Deed on 06/15/2021 15:11:30 -------------------------------------------------------------------------------- Wound Assessment Details Patient Name: Date of Service: [name] March L. 06/15/2021 2:00 PM Medical Record Number: [id number] Patient Account Number: [id number] Date of Birth/Sex: Treating RN: 06/08/1991 (29 y.o. [name] Primary Care Nithya Meriweather: PCP, NO Other Clinician: Referring Yaritzel Stange: Treating Merlinda Wrubel/Extender: Angus Palms Weeks in Treatment: 45 Wound Status Wound Number: 6 Primary Etiology: Pressure Ulcer Wound Location: Right Ischium Wound Status: Open Wounding Event: Trauma Comorbid History: Paraplegia Date Acquired: 02/10/2021 Weeks Of Treatment: 17 Clustered Wound: No Wound Measurements Length: (cm) 7 Width: (cm) 4 Depth: (cm) 2.6 Area: (cm) 21.991 Volume: (cm) 57.177 % Reduction in Area: -43.6% % Reduction in Volume: -3632.2% Epithelialization: None Tunneling: No Undermining: Yes Location 1 Starting Position (o'clock): 6 Ending Position (o'clock): 8 Maximum Distance: (cm) 2 Location 2 Starting Position (o'clock): 11 Ending Position (o'clock): 1 Maximum Distance: (cm) 4.5 Wound Description Classification: Category/Stage IV Wound Margin: Well defined, not attached Exudate Amount: Medium Exudate  Type: Purulent Exudate Color: yellow, brown, green Foul Odor After Cleansing: No Slough/Fibrino Yes Wound Bed Granulation Amount: Large (67-100%) Exposed Structure Granulation Quality: Red, Pink Fascia Exposed: No Necrotic Amount: Small (1-33%) Fat Layer (Subcutaneous Tissue) Exposed: Yes Necrotic Quality: Adherent Slough Tendon Exposed: Yes Muscle Exposed: No Joint Exposed: No Bone Exposed: Yes Treatment Notes Wound #6 (Ischium) Wound Laterality: Right Cleanser Soap and Water Discharge Instruction: May shower and wash wound with dial antibacterial soap and water prior to dressing change. Wound Cleanser Discharge Instruction: Cleanse the wound with wound cleanser prior to applying a clean dressing using gauze sponges, not tissue or cotton balls. Peri-Wound Care Topical Primary Dressing Dakin's Solution 0.25%, 16 (oz) Discharge Instruction: Moisten gauze with Dakin's solution and pack lightly into wound Secondary Dressing Woven Gauze Sponge, Non-Sterile 4x4 in Discharge Instruction: Apply over primary dressing as directed. ABD Pad, 5x9 Discharge Instruction: Apply over primary dressing as directed. Secured With 57M Medipore H Soft Cloth Surgical T 4 x 2 (in/yd) ape Discharge Instruction: Secure dressing with tape as directed. Compression Wrap Compression Stockings Add-Ons Electronic Signature(s) Signed: 06/15/2021 5:31:35 PM By: Antonieta Iba Entered By: Antonieta Iba on 06/15/2021 14:22:13 -------------------------------------------------------------------------------- Wound Assessment  Details Patient Name: Date of Service: Lauro RegulusRUSSELL, [name] 06/15/2021 2:00 PM Medical Record Number: [id number] Patient Account Number: [id number] Date of Birth/Sex: Treating RN: 07/26/1991 (29 y.o. [name] MichaelsM) Barnhart, Jodi Primary Care Payden Docter: PCP, NO Other Clinician: Referring Briane Birden: Treating Broc Caspers/Extender: Angus PalmsStone III, Hoyt [name], [name] Weeks in Treatment: 45 Wound  Status Wound Number: 8 Primary Etiology: Pressure Ulcer Wound Location: Left Trochanter Wound Status: Open Wounding Event: Pressure Injury Comorbid History: Paraplegia Date Acquired: 05/09/2021 Weeks Of Treatment: 4 Clustered Wound: No Wound Measurements Length: (cm) 4 Width: (cm) 4.4 Depth: (cm) 0.1 Area: (cm) 13.823 Volume: (cm) 1.382 % Reduction in Area: -3490.4% % Reduction in Volume: -3536.8% Epithelialization: None Tunneling: No Undermining: No Wound Description Classification: Unstageable/Unclassified Wound Margin: Distinct, outline attached Exudate Amount: Small Exudate Type: Serosanguineous Exudate Color: red, brown Foul Odor After Cleansing: No Slough/Fibrino Yes Wound Bed Granulation Amount: None Present (0%) Exposed Structure Necrotic Amount: Large (67-100%) Fascia Exposed: No Necrotic Quality: Eschar, Adherent Slough Fat Layer (Subcutaneous Tissue) Exposed: Yes Tendon Exposed: No Muscle Exposed: No Joint Exposed: No Bone Exposed: No Treatment Notes Wound #8 (Trochanter) Wound Laterality: Left Cleanser Soap and Water Discharge Instruction: May shower and wash wound with dial antibacterial soap and water prior to dressing change. Wound Cleanser Discharge Instruction: Cleanse the wound with wound cleanser prior to applying a clean dressing using gauze sponges, not tissue or cotton balls. Peri-Wound Care Topical Primary Dressing Santyl Ointment Discharge Instruction: Apply nickel thick amount to wound bed as instructed Secondary Dressing Woven Gauze Sponge, Non-Sterile 4x4 in Discharge Instruction: Apply over primary dressing moistened with saline Zetuvit Plus Silicone Border Dressing 4x4 (in/in) Discharge Instruction: Apply silicone border over primary dressing as directed. Secured With Compression Wrap Compression Stockings Facilities managerAdd-Ons Electronic Signature(s) Signed: 06/15/2021 5:31:35 PM By: Antonieta IbaBarnhart, Jodi Entered By: Antonieta IbaBarnhart, Jodi on 06/15/2021  14:22:31 -------------------------------------------------------------------------------- Wound Assessment Details Patient Name: Date of Service: [name], [name] 06/15/2021 2:00 PM Medical Record Number: [id number] Patient Account Number: [id number] Date of Birth/Sex: Treating RN: 04/17/1991 (29 y.o. [name] MichaelsM) Barnhart, Jodi Primary Care Majed Pellegrin: PCP, NO Other Clinician: Referring Thaine Garriga: Treating Monnie Anspach/Extender: Angus PalmsStone III, Hoyt [name], [name] Weeks in Treatment: 45 Wound Status Wound Number: 9 Primary Etiology: Pressure Ulcer Wound Location: Left Ischium Wound Status: Open Wounding Event: Pressure Injury Comorbid History: Paraplegia Date Acquired: 05/09/2021 Weeks Of Treatment: 4 Clustered Wound: No Wound Measurements Length: (cm) 2 Width: (cm) 3.2 Depth: (cm) 0.1 Area: (cm) 5.027 Volume: (cm) 0.503 % Reduction in Area: -121.5% % Reduction in Volume: -121.6% Epithelialization: None Tunneling: No Undermining: No Wound Description Classification: Category/Stage III Wound Margin: Distinct, outline attached Exudate Amount: Medium Exudate Type: Serosanguineous Exudate Color: red, brown Foul Odor After Cleansing: No Slough/Fibrino No Wound Bed Granulation Amount: Small (1-33%) Exposed Structure Granulation Quality: Red Fascia Exposed: No Necrotic Amount: Large (67-100%) Fat Layer (Subcutaneous Tissue) Exposed: Yes Necrotic Quality: Adherent Slough Tendon Exposed: No Muscle Exposed: No Joint Exposed: No Bone Exposed: No Treatment Notes Wound #9 (Ischium) Wound Laterality: Left Cleanser Soap and Water Discharge Instruction: May shower and wash wound with dial antibacterial soap and water prior to dressing change. Wound Cleanser Discharge Instruction: Cleanse the wound with wound cleanser prior to applying a clean dressing using gauze sponges, not tissue or cotton balls. Peri-Wound Care Topical Primary Dressing Santyl Ointment Discharge Instruction:  Apply nickel thick amount to wound bed as instructed Secondary Dressing Woven Gauze Sponges 2x2 in Discharge Instruction: Apply over primary dressing as directed. Zetuvit Plus Silicone Border Dressing 4x4 (in/in) Discharge Instruction: Apply  silicone border over primary dressing as directed. Secured With Compression Wrap Compression Stockings Facilities manager) Signed: 06/15/2021 5:31:35 PM By: Antonieta Iba Signed: 06/15/2021 6:23:27 PM By: Zenaida Deed RN, BSN Entered By: Zenaida Deed on 06/15/2021 15:10:23 -------------------------------------------------------------------------------- Vitals Details Patient Name: Date of Service: [name] 06/15/2021 2:00 PM Medical Record Number: [id number] Patient Account Number: [id number] Date of Birth/Sex: Treating RN: 12-27-1990 (29 y.o. [name] Primary Care Pantera Winterrowd: PCP, NO Other Clinician: Referring Pankaj Haack: Treating Niza Soderholm/Extender: [name] in Treatment: 45 Vital Signs Time Taken: 14:10 Temperature (F): 98.5 Height (in): 71 Pulse (bpm): 93 Weight (lbs): 136 Respiratory Rate (breaths/min): 16 Body Mass Index (BMI): 19 Blood Pressure (mmHg): 134/75 Reference Range: 80 - 120 mg / dl Electronic Signature(s) Signed: 06/15/2021 5:31:35 PM By: Antonieta Iba Entered By: Antonieta Iba on 06/15/2021 14:12:14
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
## 2021-06-20 ENCOUNTER — Ambulatory Visit: Payer: Self-pay | Admitting: Family Medicine

## 2021-06-21 ENCOUNTER — Encounter: Payer: Self-pay | Admitting: Infectious Diseases

## 2021-06-24 ENCOUNTER — Ambulatory Visit (INDEPENDENT_AMBULATORY_CARE_PROVIDER_SITE_OTHER): Payer: Medicaid Other | Admitting: Infectious Diseases

## 2021-06-24 ENCOUNTER — Other Ambulatory Visit: Payer: Self-pay

## 2021-06-24 ENCOUNTER — Encounter: Payer: Self-pay | Admitting: Infectious Diseases

## 2021-06-24 ENCOUNTER — Telehealth: Payer: Self-pay

## 2021-06-24 ENCOUNTER — Ambulatory Visit: Payer: Medicaid Other | Admitting: Infectious Diseases

## 2021-06-24 VITALS — BP 121/76 | HR 92 | Temp 98.7°F

## 2021-06-24 DIAGNOSIS — M545 Low back pain, unspecified: Secondary | ICD-10-CM | POA: Diagnosis not present

## 2021-06-24 DIAGNOSIS — R7881 Bacteremia: Secondary | ICD-10-CM | POA: Insufficient documentation

## 2021-06-24 DIAGNOSIS — Z95828 Presence of other vascular implants and grafts: Secondary | ICD-10-CM

## 2021-06-24 DIAGNOSIS — R509 Fever, unspecified: Secondary | ICD-10-CM | POA: Diagnosis not present

## 2021-06-24 DIAGNOSIS — M86151 Other acute osteomyelitis, right femur: Secondary | ICD-10-CM | POA: Diagnosis not present

## 2021-06-24 DIAGNOSIS — M869 Osteomyelitis, unspecified: Secondary | ICD-10-CM

## 2021-06-24 DIAGNOSIS — Z79899 Other long term (current) drug therapy: Secondary | ICD-10-CM

## 2021-06-24 DIAGNOSIS — G8929 Other chronic pain: Secondary | ICD-10-CM | POA: Diagnosis not present

## 2021-06-24 DIAGNOSIS — M549 Dorsalgia, unspecified: Secondary | ICD-10-CM | POA: Insufficient documentation

## 2021-06-24 MED ORDER — DOXYCYCLINE HYCLATE 100 MG PO TABS: 100.0000 mg | ORAL_TABLET | Freq: Two times a day (BID) | ORAL | 0 refills | Status: AC

## 2021-06-24 MED ORDER — CIPROFLOXACIN HCL 500 MG PO TABS: 500.0000 mg | ORAL_TABLET | Freq: Two times a day (BID) | ORAL | 0 refills | Status: AC

## 2021-06-24 NOTE — Assessment & Plan Note (Addendum)
Unfortunately I have not been able to see the wound and he declines today.  But he has been working with the wound care team and plans to see them again next week.  Based on the review of their notes and conversation with [name], PA the right wound has improved and stable currently.  However he has a left wound that is new and seems to have some eschar.  They are working on Geophysicist/field seismologist with [employer].  His inflammatory markers have remained elevated and seem to have been since 2021.  Not sure that they will give [place] much help in determining when we can stop antibiotics. I will pull his PICC line today and transition to 2 more weeks of Cipro and Doxy then stop antibiotics.  He can stop the Flagyl now.   FU via video chat in 4 weeks.
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
## 2021-06-24 NOTE — Patient Instructions (Signed)
Will have the IV team take out your PICC line today.   CONTINUE the Doxycyline (orangey-red pill) twice a day.   START Ciprofloxacin (new antibiotic) twice a day.   STOP the Metronidazole (white pill that tastes gross).   Will plan to continue your antibiotic pills for 2 more weeks and then stop.   Schedule a video visit in 4 weeks so we can check in after you stop to see how your progress is going.   If you feel the back / hip pain is getting worse for you please let me know - I would like to arrange a MRI of the hip to get a good look at the bones to make sure there is no concern for spreading infection.

## 2021-06-24 NOTE — Progress Notes (Signed)
Per verbal order from [facility] 36 cm Single Lumen Peripherally Inserted Central Catheter removed from right brachial, tip intact. No sutures present. RN confirmed length per chart. Dressing was clean and dry. Petroleum dressing applied. Pt advised no heavy lifting with this arm, leave dressing for 24 hours and call the office or seek emergent care if dressing becomes soaked with blood or swelling or sharp pain presents. Patient verbalized understanding and agreement.  Patient's questions answered to their satisfaction. Patient tolerated procedure well, RN walked patient to check out. Pharmacy notified.

## 2021-06-24 NOTE — Assessment & Plan Note (Addendum)
He has some reported tenderness in the lower back spine over the left ilium that seems to be chronic. I asked him to please continue to follow and update me with changes - Should he have worsening pain or resumption of fevers low threshold to organize an MRI of his pelvis given he has chronic wounds and risk for ascending infection.

## 2021-06-24 NOTE — Telephone Encounter (Signed)
Called [facility] to inform them patient's picc line was removed today at office visit. Spoke with Melissa who will update nursing.  Juanita Laster, RMA

## 2021-06-24 NOTE — Assessment & Plan Note (Signed)
This was removed in clinic today.  

## 2021-06-24 NOTE — Assessment & Plan Note (Signed)
Growth noted of ATOPOBIUM species from anaerobic bottle.  Uncertain if this is a contaminant but he has responded well to adding Flagyl for possible line-related bacteremia.  We will discontinue line and stop flagyl today.

## 2021-06-24 NOTE — Assessment & Plan Note (Signed)
There is a drug interaction risk between ciprofloxacin and the Zanaflex he takes for spasms.  We discussed this today and I recommended he cut his dose in half of the Zanaflex while on Cipro given the potential to increase the effect of the Zanaflex.  He was able to verbalize back to me what he will do at home and understands the issue here.

## 2021-06-24 NOTE — Assessment & Plan Note (Signed)
Resolved

## 2021-06-29 ENCOUNTER — Encounter (HOSPITAL_BASED_OUTPATIENT_CLINIC_OR_DEPARTMENT_OTHER): Payer: Medicaid Other | Attending: Physician Assistant | Admitting: Physician Assistant

## 2021-07-04 ENCOUNTER — Other Ambulatory Visit: Payer: Self-pay

## 2021-07-04 MED ORDER — OXYCODONE HCL 10 MG PO TABS: 10.0000 mg | ORAL_TABLET | Freq: Four times a day (QID) | ORAL | 0 refills | Status: DC | PRN

## 2021-07-13 ENCOUNTER — Encounter (HOSPITAL_BASED_OUTPATIENT_CLINIC_OR_DEPARTMENT_OTHER): Payer: Medicaid Other | Admitting: Physician Assistant

## 2021-07-14 ENCOUNTER — Other Ambulatory Visit: Payer: Self-pay

## 2021-07-14 ENCOUNTER — Encounter (HOSPITAL_BASED_OUTPATIENT_CLINIC_OR_DEPARTMENT_OTHER): Payer: Medicaid Other | Attending: Physician Assistant | Admitting: Internal Medicine

## 2021-07-14 DIAGNOSIS — L89314 Pressure ulcer of right buttock, stage 4: Secondary | ICD-10-CM | POA: Insufficient documentation

## 2021-07-14 DIAGNOSIS — Z7901 Long term (current) use of anticoagulants: Secondary | ICD-10-CM | POA: Insufficient documentation

## 2021-07-14 DIAGNOSIS — L89223 Pressure ulcer of left hip, stage 3: Secondary | ICD-10-CM | POA: Insufficient documentation

## 2021-07-14 DIAGNOSIS — F17200 Nicotine dependence, unspecified, uncomplicated: Secondary | ICD-10-CM | POA: Insufficient documentation

## 2021-07-14 DIAGNOSIS — L89159 Pressure ulcer of sacral region, unspecified stage: Secondary | ICD-10-CM | POA: Diagnosis present

## 2021-07-14 DIAGNOSIS — G8221 Paraplegia, complete: Secondary | ICD-10-CM | POA: Diagnosis not present

## 2021-07-14 DIAGNOSIS — L89324 Pressure ulcer of left buttock, stage 4: Secondary | ICD-10-CM | POA: Insufficient documentation

## 2021-07-14 NOTE — Progress Notes (Signed)
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 marijuana. He was referred here. The patient lives at home with his mother who works nights she is a Engineer, civil (consulting) at [employer]. He states he is able to help turn himself at night and seems motivated to do so he has some sort form of eggcrate pressure relief surface. He does not have anything for his wheelchair. Indeed I do not believe that Medicaid easily pays for any of this. It is also not easy to get home health through Medicaid these days and virtually impossible to get wound care supplies even if you do get home health. Dr. Arita Miss mentioned the wound VAC for his lower sacrum/buttock wound and I think that certainly the treatment of choice. I think we probably can get the actual device but getting somebody to change this may be a more daunting problem. He will either have to come here twice a week or perhaps we can teach his mother how to do this if she does  not already know Past medical history reasonably unremarkable. He is a smoker which I will need to talk to him about if he wishes to ever be considered for plastic surgery. He has PTSD. He has a standard wheelchair 9/10; x-ray I did last time showed soft tissue ulceration noted over the sacrum and coccyx adjacent mild erosive changes of the lower sacrum and the coccyx cannot be excluded osteomyelitis cannot be excluded. 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 Blood work I did showed an albumin of 2.4 indicative of severe protein malnutrition. Sedimentation rate 79 and CRP at 13. White  count 9.4. The elevated inflammatory markers worrisome for underlying osteomyelitis presumably of the large sacral wound. We have been using wet-to-dry dressings here. He also has wounds in the right Achilles, left lateral ankle. 9/17; we have not been able to get a CT scan of the wound on the lower coccyx/sacrum. He also has a wound on the right Achilles and a problematic area on the left lateral malleolus. The left lateral malleolus wound looks worse today we have been using Iodoflex in both of these areas. He has not been systemically unwell. He tells me he is working hard on getting his protein levels increased 10/1; since the patient was here a week later he went to the ER with worsening left leg swelling tachycardia and a worsening sacral decubitus wound. He was diagnosed with an acute DVT and started on Eliquis. He is angry at me because he said he showed me the edema in his leg when he was here a week before that although I really do not remember that conversation. In any case he was discharged on antibiotics for UTI although his culture is negative. We have been trying to get a CT scan of the pelvis looking at the underlying bone under the large sacral decubitus ulcer they were willing to do it in the ER although he did not go forward with it. I believe they also wanted to CT scan his  chest to rule out PE. Lab work showed profound hypoalbuminemia with an albumin of 2.2 which is even less than on 9/9 at which time it was 2.4. His white count was 14.3 with 87% neutrophils. We have been using normal saline with backing wet-to-dry to the large area on the coccyx and Iodoflex the other wounds including the left lateral malleolus and the left ischial tuberosity. Finally he has an area on the right Achilles heel 10/15; we finally got the CT scan then of his pelvis. 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 With his elevated inflammatory markers he is going to need IV antibiotics. He arrives in clinic today with an extremely malodorous wound on the left lateral malleolus. This had necrotic material in this last time which I removed he says it has been bleeding ever since although it is not bleeding now. He has smaller areas on the left buttock and right Achilles heel. These look somewhat better. He has not been systemically unwell. 10/20/2020 upon evaluation today patient appears to be doing actually better compared to his last evaluation. I did review his note from the discharge summary on 10/16/2020. The patient was in the hospital from 10/13/2020 through 10/16/2020. Subsequently during the time that he was in the hospital he did complete a course of ceftriaxone and Flagyl while he was hospitalized. He was discharged on Augmentin and Flagyl for 14 days. It appears that they had wanted to keep him longer in the hospital but he refused and thus was discharged. Nonetheless his wounds do appear to be doing somewhat better today which is great news as compared to last time we saw him for evaluation. There is no evidence of active infection systemically at this point which is also good news. 11/03/2020 upon evaluation today patient actually appears to be doing excellent in  regard to his wounds. He does tell me that he is think about going back to see Dr. Arita Miss to talk about doing the flap surgery  for the wound on the sacral region. In regard to the left lateral malleolus this is pretty much about closed as far as I am concerned. Obviously he seems to be doing excellent and I am very pleased with where things stand today. Patient is extremely happy to hear this. No fevers, chills, nausea, vomiting, or diarrhea. 12/22/2020 upon evaluation today patient appears to be doing better in regard to his wounds. With that being said I do not see any signs of infection which is great news. Overall I think that he is making good progress here. I do not even know the skin needed flap in regard to the left sacral region. Nonetheless I do think that he is having some issues he tells me what he feels like may be a dislocation of his right hip I think he needs to see orthopedics as soon as possible in that regard. T give him information for that today. o 01/05/2021 upon evaluation today patient appears to be doing well with regard to his wound. He has been tolerating the dressing changes without complication both in regard to the sacral region and the ankle although he has not gotten the Santyl we really need to see about getting that as soon as possible. He did receive a call from the pharmacy she just did not get the prescription at that point. 01/19/2021 upon evaluation today patient appears to be doing well with regard to his wounds currently. Both appear to be fairly clean. Fortunately there is no signs of active infection at this time. No fevers, chills, nausea, vomiting, or diarrhea. 02/16/2021 upon evaluation today patient appears to be doing decently well in regard to the sacral wound as well as his ankle wound. Both are showing signs of significant improvement which is great news and I am pleased in that regard. There does not appear to be any evidence of infection which is also  excellent news. Unfortunately he has a right ischial ulcer which is new and unfortunately I think this is also unstageable which means we are unsure how deep this is really the end up being. Obviously I think this is a big deal. 4/19; patient presents for evaluation of his right ischial ulcer and right ankle wound. He has been using Santyl to the right ischial ulcer and collagen to the ankle wound. He reports no issues today. 03/30/2021 upon evaluation today patient appears to be doing worse in regard to his wound in the right ischial location. Fortunately there does not appear to be any signs of active infection at this time which is great news systemically though locally I feel like this likely is infected. I am can obtain a culture today to see what shows so we can adjust and treat him appropriately. With that being said the ankle appears to be doing okay and there was a gluteal region on the right that was in question but I do not see anything that is actually open here. 04/06/2021 upon evaluation today patient appears to be doing poorly in regard to his wound. He is unfortunately showing signs of significant infection in my opinion. This is the right ischial location. He does actually have necrotic bone noted in the base of the wound unfortunately. This also has a significant odor at this point. I think that coupled with the fevers been having as high as 102 although he is 100.3 right now he feels hot to touch all over. I do believe that this is likely osteomyelitis and I believe that  he really needs to be treated aggressively at this point I would recommend that he needs to go to the ER for possible and likely hospital admission. I think IV antibiotics are going to be a necessary at this time. 5/27; patient was last seen 2 weeks ago. He was sent to the ED for decline in his wound and likelihood of osteomyelitis. He states he went and it was all taken care of. He states he is here only for  debridement. He denies signs of infection. He overall feels well 05/04/2021 upon evaluation today patient appears to be doing really about the same in regard to the overall size of his wound although it is significantly cleaner compared to what it was previous. There does not appear to be any signs of systemic infection though locally there still is some necrotic tissue I think that the erythema and warmth is much better. With that being said there is some necrotic bone noted I would like to take a sample of this to send for pathology and culture so that we know how to treat everything here. He does have an infectious disease referral next week on Tuesday. Subsequently that we will give them something to go off of as well as far as figuring out the best treatment course going forward. 05/11/2021 upon evaluation today patient appears to be doing well with regard to his wound especially compared to last week. Fortunately there does not appear to be any signs of active infection which is great news. No fevers, chills, nausea, vomiting, or diarrhea. The patient does have still a fairly significant wound they are going to start him on IV antibiotic therapy. He did have Proteus and Pseudomonas noted on his culture. He also had acute osteomyelitis noted on the pathology report from the bone biopsy. Patient did see Dr. Odette FractionSabina Manandhar who after reviewing the wound as well as the testing that have been performed as noted above started the patient on doxycycline, Levaquin, and metronidazole as an outpatient and to he get set up for the PICC line. Once the PICC line is established he will be taking daptomycin along with cefepime and then still the oral metronidazole. 05/18/2009 upon evaluation today patient appears to be doing well with regard to the wound we have been taking care of. With that being said unfortunately he has 2 new areas on the left trochanter and left ischial location. With that being said I do  feel like that the patient unfortunately is having this happened as a result of sitting for too long a period of time. I discussed that with him today and I do believe he needs to be more cognizant of offloading in this regard. Again this is not the first probably discussed this to be honest but again I felt the need to reiterate today based on what we are seeing. Fortunately there does not appear to be any signs of active infection at this time. No fever chills noted. 06/08/2021 upon evaluation today patient appears to be doing poorly in regard to his wounds. He has a worsening wound on the left hip and inferior gluteal location near the gluteal fold. Both of which seem to be significantly worse compared to last time I saw him. In general he seems to be developing doing this not showing signs of improvement this is unfortunate. With that being said he does want to see about a wound VAC for the right gluteal region. With that being said the problem here is that it  so close in the inferior gluteus to the scrotum that I think there can be very little margin of area for trying to keep his cell on this area. In fact I feel like it is probably to be nylon and possible to maintain this. With that being said I discussed with the patient that I think we need to try to have the wound contracted little bit from the sides to probably be able to appropriately secure this. Otherwise he is going to have issues with wound potentially getting worse from not being properly dressed with a wound VAC. 06/15/2021 upon evaluation today patient's wounds are really doing about the same. Fortunately there is no signs of infection which is great he is on antibiotics I did speak with Rexene Alberts last week about this patient and his antibiotics overall she feels like his inflammatory markers are still up but she feels like vascular continue to be an issue no matter what they do from a antibiotic standpoint. Still right now they  do have him on oral antibiotic therapy. 8/18; patient presents for follow-up. Unfortunately he is not been able to follow-up in almost 1 month. He states he has transportation issues. He has no complaints today. He denies signs of infection. He has been using wet-to-dry dressings and Santyl to the wound beds. Patient History Information obtained from Patient. Family History Hypertension - Mother, No family history of Cancer, Diabetes, Heart Disease, Hereditary Spherocytosis, Kidney Disease, Lung Disease, Seizures, Stroke, Thyroid Problems, Tuberculosis. Social History Current every day smoker, Marital Status - Single, Alcohol Use - Never, Drug Use - Current History - pot, Caffeine Use - Rarely. Medical History Eyes Denies history of Cataracts, Glaucoma, Optic Neuritis Ear/Nose/Mouth/Throat Denies history of Chronic sinus problems/congestion, Middle ear problems Hematologic/Lymphatic Denies history of Anemia, Hemophilia, Human Immunodeficiency Virus, Lymphedema, Sickle Cell Disease Respiratory Denies history of Aspiration, Asthma, Chronic Obstructive Pulmonary Disease (COPD), Pneumothorax, Sleep Apnea, Tuberculosis Cardiovascular Denies history of Angina, Arrhythmia, Congestive Heart Failure, Coronary Artery Disease, Deep Vein Thrombosis, Hypertension, Hypotension, Myocardial Infarction, Peripheral Arterial Disease, Peripheral Venous Disease, Phlebitis, Vasculitis Gastrointestinal Denies history of Cirrhosis , Colitis, Crohnoos, Hepatitis A, Hepatitis B, Hepatitis C Endocrine Denies history of Type I Diabetes, Type II Diabetes Genitourinary Denies history of End Stage Renal Disease Immunological Denies history of Lupus Erythematosus, Raynaudoos, Scleroderma Integumentary (Skin) Denies history of History of Burn Musculoskeletal Denies history of Gout, Rheumatoid Arthritis, Osteoarthritis, Osteomyelitis Neurologic Patient has history of Paraplegia Denies history of Dementia,  Neuropathy, Quadriplegia, Seizure Disorder Oncologic Denies history of Received Chemotherapy, Received Radiation Psychiatric Denies history of Anorexia/bulimia, Confinement Anxiety Objective Constitutional respirations regular, non-labored and within target range for patient.. Vitals Time Taken: 9:36 AM, Height: 71 in, Weight: 136 lbs, BMI: 19, Temperature: 98.4 F, Pulse: 93 bpm, Respiratory Rate: 17 breaths/min, Blood Pressure: 135/94 mmHg. Psychiatric pleasant and cooperative. General Notes: Right ischium: Open wound with eschar and circumferential granulation tissue Left trochanter: Large open wound with granulation tissue throughout. Left ischium: Small open wound with granulation tissue and nonviable tissue present. No signs of soft tissue infection Integumentary (Hair, Skin) Wound #6 status is Open. Original cause of wound was Trauma. The date acquired was: 02/10/2021. The wound has been in treatment 21 weeks. The wound is located on the Right Ischium. The wound measures 7cm length x 4cm width x 3.4cm depth; 21.991cm^2 area and 74.77cm^3 volume. There is bone, tendon, and Fat Layer (Subcutaneous Tissue) exposed. There is no tunneling noted, however, there is undermining starting at 10:00 and ending at  6:00 with a maximum distance of 4.9cm. There is a medium amount of purulent drainage noted. The wound margin is well defined and not attached to the wound base. There is large (67-100%) red, pink granulation within the wound bed. There is a small (1-33%) amount of necrotic tissue within the wound bed including Adherent Slough. Wound #8 status is Open. Original cause of wound was Pressure Injury. The date acquired was: 05/09/2021. The wound has been in treatment 8 weeks. The wound is located on the Left Trochanter. The wound measures 5.5cm length x 5.5cm width x 1.5cm depth; 23.758cm^2 area and 35.637cm^3 volume. There is Fat Layer (Subcutaneous Tissue) exposed. There is no tunneling or  undermining noted. There is a small amount of serosanguineous drainage noted. The wound margin is distinct with the outline attached to the wound base. There is no granulation within the wound bed. There is a large (67-100%) amount of necrotic tissue within the wound bed including Eschar and Adherent Slough. Wound #9 status is Open. Original cause of wound was Pressure Injury. The date acquired was: 05/09/2021. The wound has been in treatment 8 weeks. The wound is located on the Left Ischium. The wound measures 2cm length x 3cm width x 0.4cm depth; 4.712cm^2 area and 1.885cm^3 volume. There is Fat Layer (Subcutaneous Tissue) exposed. There is no tunneling or undermining noted. There is a medium amount of serosanguineous drainage noted. The wound margin is distinct with the outline attached to the wound base. There is small (1-33%) red granulation within the wound bed. There is a large (67-100%) amount of necrotic tissue within the wound bed including Adherent Slough. Assessment Active Problems ICD-10 Pressure ulcer of right buttock, stage 4 Pressure ulcer of left buttock, unstageable Pressure ulcer of left hip, stage 3 Unspecified severe protein-calorie malnutrition Paraplegia, complete Other chronic osteomyelitis, other site This is a patient of [facility]. Unfortunately he has been unable to follow-up with his appointments due to transportation issues. He was last seen 1 month ago. His wounds are slightly larger but appearance is overall stable. I debrided nonviable tissue. I recommended continuing wet-to-dry to the left trochanter wound and Santyl to the other wounds. No obvious signs of infection on exam. He continues to follow with infectious disease and is finishing up Cipro and doxycycline for his osteomyelitis. Procedures Wound #8 Pre-procedure diagnosis of Wound #8 is a Pressure Ulcer located on the Left Trochanter . There was a Excisional Skin/Subcutaneous Tissue Debridement with a total  area of 30.25 sq cm performed by Geralyn Corwin, DO. With the following instrument(s): Blade, and Forceps to remove Viable and Non-Viable tissue/material. Material removed includes Eschar, Subcutaneous Tissue, Slough, Skin: Dermis, and Skin: Epidermis after achieving pain control using Lidocaine. No specimens were taken. A time out was conducted at 10:01, prior to the start of the procedure. A Minimum amount of bleeding was controlled with Pressure. The procedure was tolerated well with a pain level of 0 throughout and a pain level of 0 following the procedure. Post Debridement Measurements: 5.5cm length x 5.5cm width x 1.5cm depth; 35.637cm^3 volume. Post debridement Stage noted as Unstageable/Unclassified. Character of Wound/Ulcer Post Debridement is improved. Post procedure Diagnosis Wound #8: Same as Pre-Procedure Plan Follow-up Appointments: Return Appointment in 1 week. - with Luana Shu Shower/ Hygiene: May shower with protection but do not get wound dressing(s) wet. Off-Loading: Turn and reposition every 2 hours - be sure to lift up off the chair with arms every hour while in wheelchair Other: - pillows under left calf  to keep pressure of left lateral ankle Non Wound Condition: Protect area with: - Protect sacrum with vaseline or zinc oxide WOUND #6: - Ischium Wound Laterality: Right Cleanser: Soap and Water 1 x Per Day/30 Days Discharge Instructions: May shower and wash wound with dial antibacterial soap and water prior to dressing change. Cleanser: Wound Cleanser 1 x Per Day/30 Days Discharge Instructions: Cleanse the wound with wound cleanser prior to applying a clean dressing using gauze sponges, not tissue or cotton balls. Prim Dressing: Dakin's Solution 0.25%, 16 (oz) 1 x Per Day/30 Days ary Discharge Instructions: Moisten gauze with Dakin's solution and pack lightly into wound Secondary Dressing: Woven Gauze Sponge, Non-Sterile 4x4 in 1 x Per Day/30 Days Discharge  Instructions: Apply over primary dressing as directed. Secondary Dressing: ABD Pad, 5x9 1 x Per Day/30 Days Discharge Instructions: Apply over primary dressing as directed. Secured With: 39M Medipore H Soft Cloth Surgical T 4 x 2 (in/yd) 1 x Per Day/30 Days ape Discharge Instructions: Secure dressing with tape as directed. WOUND #8: - Trochanter Wound Laterality: Left Cleanser: Soap and Water Discharge Instructions: May shower and wash wound with dial antibacterial soap and water prior to dressing change. Cleanser: Wound Cleanser Discharge Instructions: Cleanse the wound with wound cleanser prior to applying a clean dressing using gauze sponges, not tissue or cotton balls. Prim Dressing: Santyl Ointment ary Discharge Instructions: Apply nickel thick amount to wound bed as instructed Secondary Dressing: Woven Gauze Sponge, Non-Sterile 4x4 in Discharge Instructions: Apply over primary dressing moistened with saline Secondary Dressing: Zetuvit Plus Silicone Border Dressing 4x4 (in/in) Discharge Instructions: Apply silicone border over primary dressing as directed. WOUND #9: - Ischium Wound Laterality: Left Cleanser: Soap and Water Discharge Instructions: May shower and wash wound with dial antibacterial soap and water prior to dressing change. Cleanser: Wound Cleanser Discharge Instructions: Cleanse the wound with wound cleanser prior to applying a clean dressing using gauze sponges, not tissue or cotton balls. Prim Dressing: Santyl Ointment ary Discharge Instructions: Apply nickel thick amount to wound bed as instructed Secondary Dressing: Woven Gauze Sponges 2x2 in Discharge Instructions: Apply over primary dressing as directed. Secondary Dressing: Zetuvit Plus Silicone Border Dressing 4x4 (in/in) Discharge Instructions: Apply silicone border over primary dressing as directed. 1. In office sharp debridement 2. Continue Dakin's wet-to-dry to the left trochanter wound and Santyl to the  other 2 wounds 3. Follow-up in 1 week with Leonard Schwartz Electronic Signature(s) Signed: 07/14/2021 10:57:38 AM By: Geralyn Corwin DO Entered By: Geralyn Corwin on 07/14/2021 10:56:41 -------------------------------------------------------------------------------- HxROS Details Patient Name: Date of Service: [name] March L. 07/14/2021 9:30 A M Medical Record Number: [id number] Patient Account Number: [id number] Date of Birth/Sex: Treating RN: 02-24-1991 (29 y.o. [name] Primary Care Provider: PCP, NO Other Clinician: Referring Provider: Treating Provider/Extender: Malachi Pro in Treatment: 49 Information Obtained From Patient Eyes Medical History: Negative for: Cataracts; Glaucoma; Optic Neuritis Ear/Nose/Mouth/Throat Medical History: Negative for: Chronic sinus problems/congestion; Middle ear problems Hematologic/Lymphatic Medical History: Negative for: Anemia; Hemophilia; Human Immunodeficiency Virus; Lymphedema; Sickle Cell Disease Respiratory Medical History: Negative for: Aspiration; Asthma; Chronic Obstructive Pulmonary Disease (COPD); Pneumothorax; Sleep Apnea; Tuberculosis Cardiovascular Medical History: Negative for: Angina; Arrhythmia; Congestive Heart Failure; Coronary Artery Disease; Deep Vein Thrombosis; Hypertension; Hypotension; Myocardial Infarction; Peripheral Arterial Disease; Peripheral Venous Disease; Phlebitis; Vasculitis Gastrointestinal Medical History: Negative for: Cirrhosis ; Colitis; Crohns; Hepatitis A; Hepatitis B; Hepatitis C Endocrine Medical History: Negative for: Type I Diabetes; Type II Diabetes Genitourinary Medical History: Negative for: End Stage  Renal Disease Immunological Medical History: Negative for: Lupus Erythematosus; Raynauds; Scleroderma Integumentary (Skin) Medical History: Negative for: History of Burn Musculoskeletal Medical History: Negative for: Gout; Rheumatoid Arthritis;  Osteoarthritis; Osteomyelitis Neurologic Medical History: Positive for: Paraplegia Negative for: Dementia; Neuropathy; Quadriplegia; Seizure Disorder Oncologic Medical History: Negative for: Received Chemotherapy; Received Radiation Psychiatric Medical History: Negative for: Anorexia/bulimia; Confinement Anxiety Immunizations Pneumococcal Vaccine: Received Pneumococcal Vaccination: No Implantable Devices None Family and Social History Cancer: No; Diabetes: No; Heart Disease: No; Hereditary Spherocytosis: No; Hypertension: Yes - Mother; Kidney Disease: No; Lung Disease: No; Seizures: No; Stroke: No; Thyroid Problems: No; Tuberculosis: No; Current every day smoker; Marital Status - Single; Alcohol Use: Never; Drug Use: Current History - pot; Caffeine Use: Rarely; Financial Concerns: No; Food, Clothing or Shelter Needs: No; Support System Lacking: No; Transportation Concerns: No Electronic Signature(s) Signed: 07/14/2021 10:57:38 AM By: Geralyn Corwin DO Signed: 07/14/2021 5:10:54 PM By: Fonnie Mu RN Entered By: Geralyn Corwin on 07/14/2021 10:50:39 -------------------------------------------------------------------------------- SuperBill Details Patient Name: Date of Service: [name] 07/14/2021 Medical Record Number: [id number] Patient Account Number: [id number] Date of Birth/Sex: Treating RN: [birth year] (29 y.o. [name] Primary Care Provider: PCP, NO Other Clinician: Referring Provider: Treating Provider/Extender: Malachi Pro in Treatment: 49 Diagnosis Coding ICD-10 Codes Code Description L89.314 Pressure ulcer of right buttock, stage 4 L89.320 Pressure ulcer of left buttock, unstageable L89.223 Pressure ulcer of left hip, stage 3 E43 Unspecified severe protein-calorie malnutrition G82.21 Paraplegia, complete M86.68 Other chronic osteomyelitis, other site Facility Procedures CPT4 Code: 40981191 Description:  11042 - DEB SUBQ TISSUE 20 SQ CM/< ICD-10 Diagnosis Description L89.223 Pressure ulcer of left hip, stage 3 Modifier: Quantity: 1 CPT4 Code: 47829562 Description: 11045 - DEB SUBQ TISS EA ADDL 20CM ICD-10 Diagnosis Description L89.223 Pressure ulcer of left hip, stage 3 Modifier: Quantity: 1 Physician Procedures : CPT4 Code Description Modifier [id number] 11042 - WC PHYS SUBQ TISS 20 SQ CM ICD-10 Diagnosis Description L89.223 Pressure ulcer of left hip, stage 3 Quantity: 1 : [id number] 11045 - WC PHYS SUBQ TISS EA ADDL 20 CM ICD-10 Diagnosis Description L89.223 Pressure ulcer of left hip, stage 3 Quantity: 1 Electronic Signature(s) Signed: 07/14/2021 10:57:38 AM By: Geralyn Corwin DO Entered By: Geralyn Corwin on 07/14/2021 10:56:57

## 2021-07-14 NOTE — Progress Notes (Signed)
[name], [name] ([id number]) Visit Report for 07/14/2021 Arrival Information Details Patient Name: Date of Service: [name], [name] 07/14/2021 9:30 A M Medical Record Number: [id number] Patient Account Number: [id number] Date of Birth/Sex: Treating RN: January 18, 1991 (29 y.o. [name], [name] Primary Care [name]: PCP, NO Other Clinician: Referring [name]: Treating [name]/Extender: [name] in Treatment: 49 Visit Information History Since Last Visit Added or deleted any medications: No Patient Arrived: Wheel Chair Any new allergies or adverse reactions: No Arrival Time: 09:35 Had a fall or experienced change in No Accompanied By: self activities of daily living that may affect Transfer Assistance: Manual risk of falls: Patient Identification Verified: Yes Signs or symptoms of abuse/neglect since last visito No Secondary Verification Process Completed: Yes Hospitalized since last visit: No Patient Requires Transmission-Based Precautions: No Implantable device outside of the clinic excluding No Patient Has Alerts: No cellular tissue based products placed in the center since last visit: Has Dressing in Place as Prescribed: Yes Pain Present Now: No Electronic Signature(s) Signed: 07/14/2021 5:10:54 PM By: [name] Mu RN Entered By: [name] on 07/14/2021 09:36:05 -------------------------------------------------------------------------------- Lower Extremity Assessment Details Patient Name: Date of Service: [name] March L. 07/14/2021 9:30 A M Medical Record Number: [id number] Patient Account Number: [id number] Date of Birth/Sex: Treating RN: 1991-10-18 (29 y.o. [name] Primary Care [name]: PCP, NO Other Clinician: Referring [name]: Treating [name]/Extender: [name] in Treatment: 49 Electronic Signature(s) Signed: 07/14/2021 5:10:54 PM By: [name] Mu RN Entered  By: [name] on 07/14/2021 09:36:26 -------------------------------------------------------------------------------- Multi Wound Chart Details Patient Name: Date of Service: [name] March L. 07/14/2021 9:30 A M Medical Record Number: [id number] Patient Account Number: [id number] Date of Birth/Sex: Treating RN: 1991/10/27 (29 y.o. [name] Primary Care [name]: PCP, NO Other Clinician: Referring [name]: Treating [name]/Extender: [name] in Treatment: 49 Vital Signs Height(in): 71 Pulse(bpm): 93 Weight(lbs): 136 Blood Pressure(mmHg): 135/94 Body Mass Index(BMI): 19 Temperature(F): 98.4 Respiratory Rate(breaths/min): 17 Photos: [6:No Photos Right Ischium] [8:No Photos Left Trochanter] [9:No Photos Left Ischium] Wound Location: [6:Trauma] [8:Pressure Injury] [9:Pressure Injury] Wounding Event: [6:Pressure Ulcer] [8:Pressure Ulcer] [9:Pressure Ulcer] Primary Etiology: [6:Paraplegia] [8:Paraplegia] [9:Paraplegia] Comorbid History: [6:02/10/2021] [8:05/09/2021] [9:05/09/2021] Date Acquired: [6:21] [8:8] [9:8] Weeks of Treatment: [6:Open] [8:Open] [9:Open] Wound Status: [6:7x4x3.4] [8:5.5x5.5x1.5] [9:2x3x0.4] Measurements L x W x D (cm) [6:21.991] [8:23.758] [9:4.712] A (cm) : rea [6:74.77] [8:35.637] [9:1.885] Volume (cm) : [6:-43.60%] [8:-6070.90%] [9:-107.60%] % Reduction in A rea: [6:-4780.50%] [8:-93681.60%] [9:-730.40%] % Reduction in Volume: [6:10] Starting Position 1 (o'clock): [6:6] Ending Position 1 (o'clock): [6:4.9] Maximum Distance 1 (cm): [6:Yes] [8:No] [9:No] Undermining: [6:Category/Stage IV] [8:Unstageable/Unclassified] [9:Category/Stage III] Classification: [6:Medium] [8:Small] [9:Medium] Exudate A mount: [6:Purulent] [8:Serosanguineous] [9:Serosanguineous] Exudate Type: [6:yellow, brown, green] [8:red, brown] [9:red, brown] Exudate Color: [6:Well defined, not attached] [8:Distinct, outline attached]  [9:Distinct, outline attached] Wound Margin: [6:Large (67-100%)] [8:None Present (0%)] [9:Small (1-33%)] Granulation A mount: [6:Red, Pink] [8:N/A] [9:Red] Granulation Quality: [6:Small (1-33%)] [8:Large (67-100%)] [9:Large (67-100%)] Necrotic A mount: [6:Adherent Slough] [8:Eschar, Adherent Slough] [9:Adherent Slough] Necrotic Tissue: [6:Fat Layer (Subcutaneous Tissue): Yes Fat Layer (Subcutaneous Tissue): Yes Fat Layer (Subcutaneous Tissue): Yes] Exposed Structures: [6:Tendon: Yes Bone: Yes Fascia: No Muscle: No Joint: No None] [8:Fascia: No Tendon: No Muscle: No Joint: No Bone: No None] [9:Fascia: No Tendon: No Muscle: No Joint: No Bone: No None] Epithelialization: [6:N/A] [8:Debridement - Excisional] [9:N/A] Debridement: Pre-procedure Verification/Time Out N/A [8:10:01] [9:N/A] Taken: [6:N/A] [8:Lidocaine] [9:N/A] Pain Control: [6:N/A] [8:Necrotic/Eschar, Subcutaneous,] [9:N/A] Tissue Debrided: [  6:N/A] [8:Slough Skin/Subcutaneous Tissue] [9:N/A] Level: [6:N/A] [8:30.25] [9:N/A] Debridement A (sq cm): [6:rea N/A] [8:Blade, Forceps] [9:N/A] Instrument: [6:N/A] [8:Minimum] [9:N/A] Bleeding: [6:N/A] [8:Pressure] [9:N/A] Hemostasis Achieved: [6:N/A] [8:0] [9:N/A] Procedural Pain: [6:N/A] [8:0] [9:N/A] Post Procedural Pain: [6:N/A] [8:Procedure was tolerated well] [9:N/A] Debridement Treatment Response: [6:N/A] [8:5.5x5.5x1.5] [9:N/A] Post Debridement Measurements L x W x D (cm) [6:N/A] [8:35.637] [9:N/A] Post Debridement Volume: (cm) [6:N/A] [8:Unstageable/Unclassified] [9:N/A] Post Debridement Stage: [6:N/A] [8:Debridement] [9:N/A] Treatment Notes Electronic Signature(s) Signed: 07/14/2021 10:57:38 AM By: [name] Corwin DO Signed: 07/14/2021 5:10:54 PM By: [name] Mu RN Entered By: [name] on 07/14/2021 10:49:34 -------------------------------------------------------------------------------- Multi-Disciplinary Care Plan Details Patient Name: Date of  Service: [name] March L. 07/14/2021 9:30 A M Medical Record Number: [id number] Patient Account Number: [id number] Date of Birth/Sex: Treating RN: 1991-09-02 (29 y.o. [name] Primary Care [name]: PCP, NO Other Clinician: Referring [name]: Treating [name]/Extender: [name] in Treatment: 49 Multidisciplinary Care Plan reviewed with physician Active Inactive Pressure Nursing Diagnoses: Knowledge deficit related to causes and risk factors for pressure ulcer development Goals: Patient/caregiver will verbalize risk factors for pressure ulcer development Date Initiated: 07/30/2020 Target Resolution Date: 07/13/2021 Goal Status: Active Interventions: Provide education on pressure ulcers Notes: 06/08/21: Patient recently had new pressure areas, target date extended. Wound/Skin Impairment Nursing Diagnoses: Impaired tissue integrity Goals: Patient/caregiver will verbalize understanding of skin care regimen Date Initiated: 01/05/2021 Target Resolution Date: 07/13/2021 Goal Status: Active Ulcer/skin breakdown will have a volume reduction of 50% by week 8 Date Initiated: 07/30/2020 Date Inactivated: 10/20/2020 Target Resolution Date: 09/27/2020 Goal Status: Unmet Unmet Reason: Infection Ulcer/skin breakdown will have a volume reduction of 80% by week 12 Date Initiated: 10/20/2020 Date Inactivated: 12/22/2020 Target Resolution Date: 11/19/2020 Unmet Reason: paraplegic, difficulty Goal Status: Unmet offloading Interventions: Provide education on ulcer and skin care Notes: 06/08/21: wound care ongoing. Electronic Signature(s) Signed: 07/14/2021 5:10:54 PM By: [name] Mu RN Entered By: [name] on 07/14/2021 09:55:04 -------------------------------------------------------------------------------- Pain Assessment Details Patient Name: Date of Service: TRACEY, STEWART NIO L. 07/14/2021 9:30 A M Medical Record Number:  [id number] Patient Account Number: [id number] Date of Birth/Sex: Treating RN: January 21, 1991 (29 y.o. [name] Primary Care Ashika Apuzzo: PCP, NO Other Clinician: Referring Yannis Gumbs: Treating Tyashia Morrisette/Extender: [name] in Treatment: 49 Active Problems Location of Pain Severity and Description of Pain Patient Has Paino No Site Locations Pain Management and Medication Current Pain Management: Electronic Signature(s) Signed: 07/14/2021 5:10:54 PM By: [name] Mu RN Entered By: [name] on 07/14/2021 09:36:21 -------------------------------------------------------------------------------- Patient/Caregiver Education Details Patient Name: Date of Service: [name] 8/18/2022andnbsp9:30 A M Medical Record Number: [id number] Patient Account Number: [id number] Date of Birth/Gender: Treating RN: 1991/01/13 (29 y.o. [name] Primary Care Physician: PCP, NO Other Clinician: Referring Physician: Treating Physician/Extender: [name] in Treatment: 99 Education Assessment Education Provided To: Patient Education Topics Provided Wound/Skin Impairment: Methods: Explain/Verbal Responses: State content correctly Electronic Signature(s) Signed: 07/14/2021 5:10:54 PM By: [name] Mu RN Entered By: [name] on 07/14/2021 09:55:16 -------------------------------------------------------------------------------- Wound Assessment Details Patient Name: Date of Service: [name] March L. 07/14/2021 9:30 A M Medical Record Number: [id number] Patient Account Number: [id number] Date of Birth/Sex: Treating RN: 1991-05-27 (29 y.o. [name] Primary Care Anastashia Westerfeld: PCP, NO Other Clinician: Referring Cloyce Paterson: Treating Latria Mccarron/Extender: [name] in Treatment: 49 Wound Status Wound Number: 6 Primary Etiology: Pressure Ulcer Wound Location:  Right Ischium Wound Status: Open Wounding Event: Trauma Comorbid History: Paraplegia  Date Acquired: 02/10/2021 Weeks Of Treatment: 21 Clustered Wound: No Wound Measurements Length: (cm) 7 Width: (cm) 4 Depth: (cm) 3.4 Area: (cm) 21.991 Volume: (cm) 74.77 % Reduction in Area: -43.6% % Reduction in Volume: -4780.5% Epithelialization: None Tunneling: No Undermining: Yes Starting Position (o'clock): 10 Ending Position (o'clock): 6 Maximum Distance: (cm) 4.9 Wound Description Classification: Category/Stage IV Wound Margin: Well defined, not attached Exudate Amount: Medium Exudate Type: Purulent Exudate Color: yellow, brown, green Foul Odor After Cleansing: No Slough/Fibrino Yes Wound Bed Granulation Amount: Large (67-100%) Exposed Structure Granulation Quality: Red, Pink Fascia Exposed: No Necrotic Amount: Small (1-33%) Fat Layer (Subcutaneous Tissue) Exposed: Yes Necrotic Quality: Adherent Slough Tendon Exposed: Yes Muscle Exposed: No Joint Exposed: No Bone Exposed: Yes Electronic Signature(s) Signed: 07/14/2021 5:10:54 PM By: [name] MuBreedlove, Lauren RN Entered By: [name] MuBreedlove, [name] on 07/14/2021 09:54:06 -------------------------------------------------------------------------------- Wound Assessment Details Patient Name: Date of Service: [name] MarchUSSELL, [name] 07/14/2021 9:30 A M Medical Record Number: [id number] Patient Account Number: [id number] Date of Birth/Sex: Treating RN: 05/09/1991 (29 y.o. [name] MerlM) Breedlove, [name] Primary Care Suleyman Ehrman: PCP, NO Other Clinician: Referring Nakyah Erdmann: Treating Avory Mimbs/Extender: [name] ProHoffman, Jessica [name], [name] Weeks in Treatment: 49 Wound Status Wound Number: 8 Primary Etiology: Pressure Ulcer Wound Location: Left Trochanter Wound Status: Open Wounding Event: Pressure Injury Comorbid History: Paraplegia Date Acquired: 05/09/2021 Weeks Of Treatment: 8 Clustered Wound: No Wound Measurements Length: (cm) 5.5 Width: (cm)  5.5 Depth: (cm) 1.5 Area: (cm) 23.758 Volume: (cm) 35.637 % Reduction in Area: -6070.9% % Reduction in Volume: -93681.6% Epithelialization: None Tunneling: No Undermining: No Wound Description Classification: Unstageable/Unclassified Wound Margin: Distinct, outline attached Exudate Amount: Small Exudate Type: Serosanguineous Exudate Color: red, brown Foul Odor After Cleansing: No Slough/Fibrino Yes Wound Bed Granulation Amount: None Present (0%) Exposed Structure Necrotic Amount: Large (67-100%) Fascia Exposed: No Necrotic Quality: Eschar, Adherent Slough Fat Layer (Subcutaneous Tissue) Exposed: Yes Tendon Exposed: No Muscle Exposed: No Joint Exposed: No Bone Exposed: No Electronic Signature(s) Signed: 07/14/2021 5:10:54 PM By: [name] MuBreedlove, Lauren RN Entered By: [name] MuBreedlove, [name] on 07/14/2021 09:53:26 -------------------------------------------------------------------------------- Wound Assessment Details Patient Name: Date of Service: [name] MarchUSSELL, [name] 07/14/2021 9:30 A M Medical Record Number: [id number] Patient Account Number: [id number] Date of Birth/Sex: Treating RN: 05/11/1991 (29 y.o. [name] MerlM) Breedlove, [name] Primary Care Jo-Anne Kluth: PCP, NO Other Clinician: Referring Shams Fill: Treating Marysol Wellnitz/Extender: [name] ProHoffman, Jessica [name], [name] Weeks in Treatment: 49 Wound Status Wound Number: 9 Primary Etiology: Pressure Ulcer Wound Location: Left Ischium Wound Status: Open Wounding Event: Pressure Injury Comorbid History: Paraplegia Date Acquired: 05/09/2021 Weeks Of Treatment: 8 Clustered Wound: No Wound Measurements Length: (cm) 2 Width: (cm) 3 Depth: (cm) 0.4 Area: (cm) 4.712 Volume: (cm) 1.885 % Reduction in Area: -107.6% % Reduction in Volume: -730.4% Epithelialization: None Tunneling: No Undermining: No Wound Description Classification: Category/Stage III Wound Margin: Distinct, outline attached Exudate Amount: Medium Exudate Type:  Serosanguineous Exudate Color: red, brown Foul Odor After Cleansing: No Slough/Fibrino No Wound Bed Granulation Amount: Small (1-33%) Exposed Structure Granulation Quality: Red Fascia Exposed: No Necrotic Amount: Large (67-100%) Fat Layer (Subcutaneous Tissue) Exposed: Yes Necrotic Quality: Adherent Slough Tendon Exposed: No Muscle Exposed: No Joint Exposed: No Bone Exposed: No Electronic Signature(s) Signed: 07/14/2021 5:10:54 PM By: [name] MuBreedlove, Lauren RN Entered By: [name] MuBreedlove, [name] on 07/14/2021 09:53:44 -------------------------------------------------------------------------------- Vitals Details Patient Name: Date of Service: [name] MarchUSSELL, [name] 07/14/2021 9:30 A M Medical Record Number: [id number] Patient Account Number: [id number] Date of Birth/Sex: Treating RN: 08/20/1991 (29 y.o. [name] GrovesM) Breedlove, [name] Primary Care Dionysios Massman: PCP, NO Other Clinician: Referring Jaamal Farooqui: Treating  Davionna Blacksher/Extender: Lynne Logan Weeks in Treatment: 49 Vital Signs Time Taken: 09:36 Temperature (F): 98.4 Height (in): 71 Pulse (bpm): 93 Weight (lbs): 136 Respiratory Rate (breaths/min): 17 Body Mass Index (BMI): 19 Blood Pressure (mmHg): 135/94 Reference Range: 80 - 120 mg / dl Electronic Signature(s) Signed: 07/14/2021 5:10:54 PM By: [name] Mu RN Entered By: [name] on 07/14/2021 09:36:17

## 2021-07-26 ENCOUNTER — Telehealth (INDEPENDENT_AMBULATORY_CARE_PROVIDER_SITE_OTHER): Payer: Medicaid Other | Admitting: Infectious Diseases

## 2021-07-26 ENCOUNTER — Encounter: Payer: Self-pay | Admitting: Infectious Diseases

## 2021-07-26 ENCOUNTER — Other Ambulatory Visit: Payer: Self-pay

## 2021-07-26 DIAGNOSIS — L89314 Pressure ulcer of right buttock, stage 4: Secondary | ICD-10-CM

## 2021-07-26 MED ORDER — OXYCODONE HCL 10 MG PO TABS: 10.0000 mg | ORAL_TABLET | Freq: Four times a day (QID) | ORAL | 0 refills | Status: DC | PRN

## 2021-07-26 NOTE — Assessment & Plan Note (Signed)
New foul smelling wound reported on the Rt hip. Described on 8/18 as stage 3. He has follow up with wound care tomorrow. Likely needs debridement. May be helpful if concern for deeper contiguous infection to sample wound bed or bone if visible for cultures and further guidance on treatment. He does have a history of pseudomonas from other wound locations and 2 weeks ago completed course of cipro. No great improvement in inflammatory marker response on previous IV treatments so not overtly helpful in monitoring; explained how it's hard to tell how much will respond to debridement and optimal wound care over the phone and will need to rely on wound clinic team for guidance. We can certainly assist .
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
## 2021-07-27 ENCOUNTER — Other Ambulatory Visit: Payer: Self-pay

## 2021-07-27 ENCOUNTER — Other Ambulatory Visit (HOSPITAL_COMMUNITY)
Admission: RE | Admit: 2021-07-27 | Discharge: 2021-07-27 | Disposition: A | Discharge status: Home / Self Care | Payer: Medicaid Other | Attending: *Deleted | Admitting: *Deleted

## 2021-07-27 ENCOUNTER — Encounter (HOSPITAL_BASED_OUTPATIENT_CLINIC_OR_DEPARTMENT_OTHER): Payer: Medicaid Other | Admitting: Physician Assistant

## 2021-07-27 DIAGNOSIS — L089 Local infection of the skin and subcutaneous tissue, unspecified: Secondary | ICD-10-CM | POA: Insufficient documentation

## 2021-07-27 DIAGNOSIS — L89159 Pressure ulcer of sacral region, unspecified stage: Secondary | ICD-10-CM | POA: Diagnosis not present

## 2021-07-27 NOTE — Progress Notes (Addendum)
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 Wound Condition: Protect area with: - Protect sacrum with vaseline or zinc oxide Laboratory ordered were: Anerobic culture left trochanter WOUND #6: - Ischium Wound Laterality: Right Cleanser: Soap and Water 1 x Per Day/30 Days Discharge Instructions: May shower and wash wound with dial antibacterial soap and water prior to dressing change. Cleanser: Wound Cleanser 1 x Per Day/30 Days Discharge Instructions: Cleanse the wound with wound cleanser prior to applying [name] clean dressing  using gauze sponges, not tissue or cotton balls. Prim Dressing: Dakin's Solution 0.25%, 16 (oz) 1 x Per Day/30 Days ary Discharge Instructions: Moisten gauze with Dakin's solution and pack lightly into wound Secondary Dressing: Woven Gauze Sponge, Non-Sterile 4x4 in 1 x Per Day/30 Days Discharge Instructions: Apply over primary dressing as directed. Secondary Dressing: ABD Pad, 5x9 1 x Per Day/30 Days Discharge Instructions: Apply over primary dressing as directed. Secured With: 83M Medipore H Soft Cloth Surgical T 4 x 2 (in/yd) 1 x Per Day/30 Days ape Discharge Instructions: Secure dressing with tape as directed. WOUND #8: - Trochanter Wound Laterality: Left Cleanser: Soap and Water 1 x Per Day/30 Days Discharge Instructions: May shower and wash wound with dial antibacterial soap and water prior to dressing change. Cleanser: Wound Cleanser 1 x Per Day/30 Days Discharge Instructions: Cleanse the wound with wound cleanser prior to applying [name] clean dressing using gauze sponges, not tissue or cotton balls. Prim Dressing: Dakin's Solution 0.125%, 16 (oz) 1 x Per Day/30 Days ary Discharge Instructions: Moisten gauze with Dakin's solution and pack lightly into wound Secondary Dressing: Woven Gauze Sponge, Non-Sterile 4x4 in 1 x Per Day/30 Days Discharge Instructions: Apply over primary dressing moistened with saline Secondary Dressing: Zetuvit Plus Silicone Border Dressing 4x4 (in/in) 1 x Per Day/30 Days Discharge Instructions: Apply silicone border over primary dressing as directed. WOUND #9: - Ischium Wound Laterality: Left Cleanser: Soap and Water 1 x Per Day/30 Days Discharge Instructions: May shower and wash wound with dial antibacterial soap and water prior to dressing change. Cleanser: Wound Cleanser 1 x Per Day/30 Days Discharge Instructions: Cleanse the wound with wound cleanser prior to applying [name] clean dressing using gauze sponges, not tissue or cotton balls. Prim Dressing: Santyl  Ointment 1 x Per Day/30 Days ary Discharge Instructions: Apply nickel thick amount to wound bed as instructed Secondary Dressing: Zetuvit Plus Silicone Border Dressing 4x4 (in/in) 1 x Per Day/30 Days Discharge Instructions: Apply silicone border over primary  dressing as directed. 1. Would recommend currently that we go ahead and continue with the wound care measures as before I really think he should be using the Dakin's moistened gauze that something that is been recommended all along and not even certain that is ever gotten to be perfectly honest. I think he needs to do that sooner rather than later. 2. We will continue with Santyl to the left gluteal location Lisette GrinderDakin should be used on the other 2 locations. 3. I am also can recommend that we have the patient continue with offloading he needs to be doing this aggressively to try to prevent anything from worsening significantly here. We will see patient back for reevaluation in 1 week here in the clinic. If anything worsens or changes patient will contact our office for additional recommendations. Electronic Signature(s) Signed: 07/27/2021 4:58:27 PM By: Lenda KelpStone III, Mana Haberl PA-C Entered By: Lenda KelpStone III, Quinnten Calvin on 07/27/2021 16:58:27 -------------------------------------------------------------------------------- SuperBill Details Patient Name: Date of Service: [name], [name] 07/27/2021 Medical Record Number: [id number] Patient Account Number: [id number] Date of Birth/Sex: Treating RN: 11/17/1991 (29 y.o. 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, other site Facility Procedures CPT4 Code: [id number] Description: 11042 - DEB SUBQ TISSUE 20 SQ CM/< ICD-10 Diagnosis Description L89.324 Pressure ulcer of left buttock, stage 4 L89.223 Pressure ulcer of left hip, stage 3 Modifier: Quantity: 1 Physician Procedures : CPT4 Code Description Modifier [id number] 99214 - WC PHYS LEVEL 4 - EST PT 25 ICD-10 Diagnosis Description L89.314 Pressure ulcer of right buttock, stage 4 L89.324 Pressure ulcer of left buttock, stage 4 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

## 2021-07-29 NOTE — Progress Notes (Signed)
[name], Jarrius L. ([id number]) Visit Report for 07/27/2021 Arrival Information Details Patient Name: Date of Service: [name], [name] 07/27/2021 2:45 PM Medical Record Number: [id number] Patient Account Number: [id number] Date of Birth/Sex: Treating RN: 12/02/1990 (29 y.o. [name]) Boehlein, Linda Primary Care Ronna Herskowitz: PCP, NO Other Clinician: Referring Zsazsa Bahena: Treating Dashan Chizmar/Extender: Albertine GratesStone III, Hoyt [name], [name] Weeks in Treatment: 51 Visit Information History Since Last Visit Added or deleted any medications: No Patient Arrived: Wheel Chair Any new allergies or adverse reactions: No Arrival Time: 14:14 Had a fall or experienced change in No Accompanied By: self activities of daily living that may affect Transfer Assistance: None risk of falls: Patient Identification Verified: Yes Signs or symptoms of abuse/neglect since last visito No Secondary Verification Process Completed: Yes Hospitalized since last visit: No Patient Requires Transmission-Based Precautions: No Implantable device outside of the clinic excluding No Patient Has Alerts: No cellular tissue based products placed in the center since last visit: Has Dressing in Place as Prescribed: Yes Pain Present Now: No Electronic Signature(s) Signed: 07/27/2021 4:49:46 PM By: [name], Linda RN, BSN Entered By: [name], Linda on 07/27/2021 14:17:28 -------------------------------------------------------------------------------- Encounter Discharge Information Details Patient Name: Date of Service: [name] RegulusUSSELL, [name] 07/27/2021 2:45 PM Medical Record Number: [id number] Patient Account Number: [id number] Date of Birth/Sex: Treating RN: 03/08/1991 (29 y.o. [name]) Boehlein, Linda Primary Care Talin Rozeboom: PCP, NO Other Clinician: Referring Navi Ewton: Treating Amirr Achord/Extender: Albertine GratesStone III, Hoyt [name], [name] Weeks in Treatment: [phone] Encounter Discharge Information Items Post Procedure Vitals Discharge Condition:  Stable Temperature (F): 98.6 Ambulatory Status: Wheelchair Pulse (bpm): 93 Discharge Destination: Home Respiratory Rate (breaths/min): 18 Transportation: Private Auto Blood Pressure (mmHg): 141/82 Accompanied By: self Schedule Follow-up Appointment: Yes Clinical Summary of Care: Patient Declined Electronic Signature(s) Signed: 07/27/2021 4:49:46 PM By: [name], Linda RN, BSN Entered By: [name], Linda on 07/27/2021 15:37:40 -------------------------------------------------------------------------------- Lower Extremity Assessment Details Patient Name: Date of Service: [name], [name] 07/27/2021 2:45 PM Medical Record Number: [id number] Patient Account Number: [id number] Date of Birth/Sex: Treating RN: 11/10/1991 (29 y.o. [name]) Boehlein, Linda Primary Care Rykin Route: PCP, NO Other Clinician: Referring Ritvik Mczeal: Treating Norah Devin/Extender: Albertine GratesStone III, Hoyt [name], [name] Weeks in Treatment: 51 Electronic Signature(s) Signed: 07/27/2021 4:49:46 PM By: [name], Linda RN, BSN Entered By: [name], Linda on 07/27/2021 14:20:51 -------------------------------------------------------------------------------- Multi-Disciplinary Care Plan Details Patient Name: Date of Service: [name], [name] 07/27/2021 2:45 PM Medical Record Number: [id number] Patient Account Number: [id number] Date of Birth/Sex: Treating RN: 03/10/1991 (29 y.o. [name]) Boehlein, Linda Primary Care Nerida Boivin: PCP, NO Other Clinician: Referring Bobbi Yount: Treating Bartlett Enke/Extender: Albertine GratesStone III, Hoyt [name], [name] Weeks in Treatment: 2251 Multidisciplinary Care Plan reviewed with physician Active Inactive Pressure Nursing Diagnoses: Knowledge deficit related to causes and risk factors for pressure ulcer development Goals: Patient/caregiver will verbalize risk factors for pressure ulcer development Date Initiated: 07/30/2020 Target Resolution Date: 08/10/2021 Goal Status: Active Interventions: Provide education on  pressure ulcers Notes: 06/08/21: Patient recently had new pressure areas, target date extended. Wound/Skin Impairment Nursing Diagnoses: Impaired tissue integrity Goals: Patient/caregiver will verbalize understanding of skin care regimen Date Initiated: 01/05/2021 Target Resolution Date: 08/10/2021 Goal Status: Active Ulcer/skin breakdown will have a volume reduction of 50% by week 8 Date Initiated: 07/30/2020 Date Inactivated: 10/20/2020 Target Resolution Date: 09/27/2020 Goal Status: Unmet Unmet Reason: Infection Ulcer/skin breakdown will have a volume reduction of 80% by week 12 Date Initiated: 10/20/2020 Date Inactivated: 12/22/2020 Target Resolution Date: 11/19/2020 Unmet Reason: paraplegic, difficulty Goal Status: Unmet offloading Interventions: Provide education on ulcer and skin care  Notes: 06/08/21: wound care ongoing. Electronic Signature(s) Signed: 07/27/2021 4:49:46 PM By: [name] Deed RN, BSN Entered By: [name] on 07/27/2021 14:37:30 -------------------------------------------------------------------------------- Pain Assessment Details Patient Name: Date of Service: [name], DUPLANTIS 07/27/2021 2:45 PM Medical Record Number: [id number] Patient Account Number: [id number] Date of Birth/Sex: Treating RN: 02-13-1991 (29 y.o. [name] Primary Care [name]: PCP, NO Other Clinician: Referring Catrell Morrone: Treating Billy Rocco/Extender: [name] in Treatment: 51 Active Problems Location of Pain Severity and Description of Pain Patient Has Paino No Site Locations Rate the pain. Current Pain Level: 0 Pain Management and Medication Current Pain Management: Electronic Signature(s) Signed: 07/27/2021 4:49:46 PM By: [name] Deed RN, BSN Entered By: [name] on 07/27/2021 14:18:48 -------------------------------------------------------------------------------- Patient/Caregiver Education Details Patient Name: Date  of Service: [name] 8/31/2022andnbsp2:45 PM Medical Record Number: [id number] Patient Account Number: [id number] Date of Birth/Gender: Treating RN: Jul 18, 1991 (29 y.o. [name] Primary Care Physician: PCP, NO Other Clinician: Referring Physician: Treating Physician/Extender: [name] in Treatment: 28 Education Assessment Education Provided To: Patient Education Topics Provided Pressure: Methods: Explain/Verbal Responses: Reinforcements needed, State content correctly Wound/Skin Impairment: Methods: Explain/Verbal Responses: Reinforcements needed, State content correctly Electronic Signature(s) Signed: 07/27/2021 4:49:46 PM By: [name] Deed RN, BSN Entered By: [name] on 07/27/2021 14:37:56 -------------------------------------------------------------------------------- Wound Assessment Details Patient Name: Date of Service: [name]. 07/27/2021 2:45 PM Medical Record Number: [id number] Patient Account Number: [id number] Date of Birth/Sex: Treating RN: 08-May-1991 (29 y.o. [name] Primary Care Josselyn Harkins: PCP, NO Other Clinician: Referring Alydia Gosser: Treating Hannia Matchett/Extender: [name] in Treatment: 51 Wound Status Wound Number: 6 Primary Etiology: Pressure Ulcer Wound Location: Right Ischium Wound Status: Open Wounding Event: Trauma Comorbid History: Paraplegia Date Acquired: 02/10/2021 Weeks Of Treatment: 23 Clustered Wound: No Photos Wound Measurements Length: (cm) 5.8 Width: (cm) 5.2 Depth: (cm) 2.8 Area: (cm) 23.688 Volume: (cm) 66.325 % Reduction in Area: -54.7% % Reduction in Volume: -4229.3% Epithelialization: None Tunneling: No Undermining: Yes Starting Position (o'clock): 11 Ending Position (o'clock): 3 Maximum Distance: (cm) 3.8 Wound Description Classification: Category/Stage IV Wound Margin: Well defined, not attached Exudate Amount:  Medium Exudate Type: Serosanguineous Exudate Color: red, brown Foul Odor After Cleansing: No Slough/Fibrino Yes Wound Bed Granulation Amount: Large (67-100%) Exposed Structure Granulation Quality: Red, Pink Fascia Exposed: No Necrotic Amount: Small (1-33%) Fat Layer (Subcutaneous Tissue) Exposed: Yes Necrotic Quality: Adherent Slough Tendon Exposed: Yes Muscle Exposed: No Joint Exposed: No Bone Exposed: No Treatment Notes Wound #6 (Ischium) Wound Laterality: Right Cleanser Soap and Water Discharge Instruction: May shower and wash wound with dial antibacterial soap and water prior to dressing change. Wound Cleanser Discharge Instruction: Cleanse the wound with wound cleanser prior to applying a clean dressing using gauze sponges, not tissue or cotton balls. Peri-Wound Care Topical Primary Dressing Dakin's Solution 0.25%, 16 (oz) Discharge Instruction: Moisten gauze with Dakin's solution and pack lightly into wound Secondary Dressing Woven Gauze Sponge, Non-Sterile 4x4 in Discharge Instruction: Apply over primary dressing as directed. ABD Pad, 5x9 Discharge Instruction: Apply over primary dressing as directed. Secured With 36M Medipore H Soft Cloth Surgical T 4 x 2 (in/yd) ape Discharge Instruction: Secure dressing with tape as directed. Compression Wrap Compression Stockings Add-Ons Electronic Signature(s) Signed: 07/27/2021 4:49:46 PM By: [name] Deed RN, BSN Signed: 07/29/2021 4:18:55 PM By: Haywood Pao EMT Entered By: Haywood Pao on 07/27/2021 14:33:47 -------------------------------------------------------------------------------- Wound Assessment Details Patient Name: Date of Service: Perlie Gold, A NTO  NIO L. 07/27/2021 2:45 PM Medical Record Number: [id number] Patient Account Number: [id number] Date of Birth/Sex: Treating RN: Apr 20, 1991 (29 y.o. [name] Primary Care Kaycee Haycraft: PCP, NO Other Clinician: Referring Canaan Holzer: Treating  Jaiyanna Safran/Extender: Angus Palms Weeks in Treatment: 51 Wound Status Wound Number: 8 Primary Etiology: Pressure Ulcer Wound Location: Left Trochanter Wound Status: Open Wounding Event: Pressure Injury Comorbid History: Paraplegia Date Acquired: 05/09/2021 Weeks Of Treatment: 10 Clustered Wound: No Photos Wound Measurements Length: (cm) 5 Width: (cm) 5.4 Depth: (cm) 2.1 Area: (cm) 21.206 Volume: (cm) 44.532 % Reduction in Area: -5408.1% % Reduction in Volume: -117089.5% Wound Description Classification: Unstageable/Unclassified Exudate Amount: Small Exudate Type: Serosanguineous Exudate Color: red, brown Electronic Signature(s) Signed: 07/27/2021 4:49:46 PM By: [name] Deed RN, BSN Signed: 07/29/2021 4:18:55 PM By: Haywood Pao EMT Entered By: Haywood Pao on 07/27/2021 14:35:41 -------------------------------------------------------------------------------- Wound Assessment Details Patient Name: Date of Service: [name]. 07/27/2021 2:45 PM Medical Record Number: [id number] Patient Account Number: [id number] Date of Birth/Sex: Treating RN: 1991/07/07 (29 y.o. [name] Primary Care Quan Cybulski: PCP, NO Other Clinician: Referring Johnnay Pleitez: Treating Tyeson Tanimoto/Extender: Angus Palms Weeks in Treatment: 51 Wound Status Wound Number: 8 Primary Etiology: Pressure Ulcer Wound Location: Left Trochanter Wound Status: Open Wounding Event: Pressure Injury Comorbid History: Paraplegia Date Acquired: 05/09/2021 Weeks Of Treatment: 10 Clustered Wound: No Photos Wound Measurements Length: (cm) 5 Width: (cm) 5.4 Depth: (cm) 2.1 Area: (cm) 21.206 Volume: (cm) 44.532 % Reduction in Area: -5408.1% % Reduction in Volume: -117089.5% Epithelialization: None Tunneling: No Undermining: Yes Starting Position (o'clock): 6 Ending Position (o'clock): 9 Maximum Distance: (cm) 1 Wound Description Classification:  Category/Stage IV Wound Margin: Distinct, outline attached Exudate Amount: Medium Exudate Type: Serosanguineous Exudate Color: red, brown Foul Odor After Cleansing: Yes Due to Product Use: No Slough/Fibrino Yes Wound Bed Granulation Amount: Medium (34-66%) Exposed Structure Granulation Quality: Red Fascia Exposed: No Necrotic Amount: Medium (34-66%) Fat Layer (Subcutaneous Tissue) Exposed: Yes Necrotic Quality: Adherent Slough Tendon Exposed: No Muscle Exposed: Yes Necrosis of Muscle: Yes Joint Exposed: No Bone Exposed: No Treatment Notes Wound #8 (Trochanter) Wound Laterality: Left Cleanser Soap and Water Discharge Instruction: May shower and wash wound with dial antibacterial soap and water prior to dressing change. Wound Cleanser Discharge Instruction: Cleanse the wound with wound cleanser prior to applying a clean dressing using gauze sponges, not tissue or cotton balls. Peri-Wound Care Topical Primary Dressing Dakin's Solution 0.125%, 16 (oz) Discharge Instruction: Moisten gauze with Dakin's solution and pack lightly into wound Secondary Dressing Woven Gauze Sponge, Non-Sterile 4x4 in Discharge Instruction: Apply over primary dressing moistened with saline Zetuvit Plus Silicone Border Dressing 4x4 (in/in) Discharge Instruction: Apply silicone border over primary dressing as directed. Secured With Compression Wrap Compression Stockings Facilities manager) Signed: 07/27/2021 4:49:46 PM By: [name] Deed RN, BSN Entered By: [name] on 07/27/2021 14:52:15 -------------------------------------------------------------------------------- Wound Assessment Details Patient Name: Date of Service: HAYDON, DORRIS 07/27/2021 2:45 PM Medical Record Number: [id number] Patient Account Number: [id number] Date of Birth/Sex: Treating RN: 02-27-1991 (29 y.o. [name] Primary Care Deryk Bozman: PCP, NO Other Clinician: Referring Jillian Pianka: Treating  Brandee Markin/Extender: Angus Palms Weeks in Treatment: 51 Wound Status Wound Number: 9 Primary Etiology: Pressure Ulcer Wound Location: Left Ischium Wound Status: Open Wounding Event: Pressure Injury Comorbid History: Paraplegia Date Acquired: 05/09/2021 Weeks Of Treatment: 10 Clustered Wound: No Photos Wound Measurements Length: (cm) 2.4 Width: (cm) 3 Depth: (cm) 0.1 Area: (cm) 5.655 Volume: (cm) 0.565 %  Reduction in Area: -149.1% % Reduction in Volume: -148.9% Epithelialization: None Tunneling: No Undermining: No Wound Description Classification: Category/Stage III Wound Margin: Distinct, outline attached Exudate Amount: Medium Exudate Type: Serous Exudate Color: amber Foul Odor After Cleansing: No Slough/Fibrino No Wound Bed Granulation Amount: Small (1-33%) Exposed Structure Granulation Quality: Red Fascia Exposed: No Necrotic Amount: Large (67-100%) Fat Layer (Subcutaneous Tissue) Exposed: Yes Necrotic Quality: Adherent Slough Tendon Exposed: No Muscle Exposed: No Joint Exposed: No Bone Exposed: No Treatment Notes Wound #9 (Ischium) Wound Laterality: Left Cleanser Soap and Water Discharge Instruction: May shower and wash wound with dial antibacterial soap and water prior to dressing change. Wound Cleanser Discharge Instruction: Cleanse the wound with wound cleanser prior to applying a clean dressing using gauze sponges, not tissue or cotton balls. Peri-Wound Care Topical Primary Dressing Santyl Ointment Discharge Instruction: Apply nickel thick amount to wound bed as instructed Secondary Dressing Zetuvit Plus Silicone Border Dressing 4x4 (in/in) Discharge Instruction: Apply silicone border over primary dressing as directed. Secured With Compression Wrap Compression Stockings Facilities manager) Signed: 07/27/2021 4:49:46 PM By: [name] Deed RN, BSN Entered By: [name] on 07/27/2021  14:34:36 -------------------------------------------------------------------------------- Vitals Details Patient Name: Date of Service: [name]. 07/27/2021 2:45 PM Medical Record Number: [id number] Patient Account Number: [id number] Date of Birth/Sex: Treating RN: [birth year] (29 y.o. [name] Primary Care Almond Fitzgibbon: PCP, NO Other Clinician: Referring Theodore Rahrig: Treating Jamala Kohen/Extender: [name] in Treatment: 51 Vital Signs Time Taken: 14:17 Temperature (F): 98.6 Height (in): 71 Pulse (bpm): 93 Source: Stated Respiratory Rate (breaths/min): 18 Weight (lbs): 136 Blood Pressure (mmHg): 141/82 Source: Stated Reference Range: 80 - 120 mg / dl Body Mass Index (BMI): 19 Electronic Signature(s) Signed: 07/27/2021 4:49:46 PM By: [name] Deed RN, BSN Entered By: [name] on 07/27/2021 14:18:32

## 2021-08-01 LAB — AEROBIC/ANAEROBIC CULTURE W GRAM STAIN (SURGICAL/DEEP WOUND): Gram Stain: NONE SEEN

## 2021-08-02 ENCOUNTER — Other Ambulatory Visit: Payer: Self-pay

## 2021-08-02 ENCOUNTER — Emergency Department (HOSPITAL_COMMUNITY)
Admission: EM | Admit: 2021-08-02 | Discharge: 2021-08-03 | Disposition: A | Discharge status: Home / Self Care | Payer: Medicaid Other | Source: Home / Self Care

## 2021-08-02 ENCOUNTER — Telehealth: Payer: Self-pay | Admitting: *Deleted

## 2021-08-02 ENCOUNTER — Encounter: Payer: Self-pay | Admitting: *Deleted

## 2021-08-02 DIAGNOSIS — X58XXXA Exposure to other specified factors, initial encounter: Secondary | ICD-10-CM | POA: Insufficient documentation

## 2021-08-02 DIAGNOSIS — Z5321 Procedure and treatment not carried out due to patient leaving prior to being seen by health care provider: Secondary | ICD-10-CM | POA: Insufficient documentation

## 2021-08-02 DIAGNOSIS — S71102A Unspecified open wound, left thigh, initial encounter: Secondary | ICD-10-CM | POA: Insufficient documentation

## 2021-08-02 DIAGNOSIS — R509 Fever, unspecified: Secondary | ICD-10-CM | POA: Insufficient documentation

## 2021-08-02 DIAGNOSIS — S31829A Unspecified open wound of left buttock, initial encounter: Secondary | ICD-10-CM | POA: Insufficient documentation

## 2021-08-02 LAB — COMPREHENSIVE METABOLIC PANEL
ALT: 20 U/L (ref 0–44)
AST: 22 U/L (ref 15–41)
Albumin: 2.9 g/dL — ABNORMAL LOW (ref 3.5–5.0)
Alkaline Phosphatase: 87 U/L (ref 38–126)
Anion gap: 9 (ref 5–15)
BUN: 12 mg/dL (ref 6–20)
CO2: 26 mmol/L (ref 22–32)
Calcium: 9.1 mg/dL (ref 8.9–10.3)
Chloride: 100 mmol/L (ref 98–111)
Creatinine, Ser: 0.66 mg/dL (ref 0.61–1.24)
GFR, Estimated: >60 mL/min (ref >60)
Glucose, Bld: 77 mg/dL (ref 70–99)
Potassium: 4.5 mmol/L (ref 3.5–5.1)
Sodium: 135 mmol/L (ref 135–145)
Total Bilirubin: 0.4 mg/dL (ref 0.3–1.2)
Total Protein: 9 g/dL — ABNORMAL HIGH (ref 6.5–8.1)

## 2021-08-02 LAB — CBC WITH DIFFERENTIAL/PLATELET
Abs Immature Granulocytes: 0.04 10*3/uL (ref 0.00–0.07)
Basophils Absolute: 0 10*3/uL (ref 0.0–0.1)
Basophils Relative: 0 %
Eosinophils Absolute: 0 10*3/uL (ref 0.0–0.5)
Eosinophils Relative: 0 %
HCT: 38.1 % — ABNORMAL LOW (ref 39.0–52.0)
Hemoglobin: 11 g/dL — ABNORMAL LOW (ref 13.0–17.0)
Immature Granulocytes: 0 %
Lymphocytes Relative: 21 %
Lymphs Abs: 2 10*3/uL (ref 0.7–4.0)
MCH: 20.4 pg — ABNORMAL LOW (ref 26.0–34.0)
MCHC: 28.9 g/dL — ABNORMAL LOW (ref 30.0–36.0)
MCV: 70.8 fL — ABNORMAL LOW (ref 80.0–100.0)
Monocytes Absolute: 0.7 10*3/uL (ref 0.1–1.0)
Monocytes Relative: 7 %
Neutro Abs: 7 10*3/uL (ref 1.7–7.7)
Neutrophils Relative %: 72 %
Platelets: 687 10*3/uL — ABNORMAL HIGH (ref 150–400)
RBC: 5.38 MIL/uL (ref 4.22–5.81)
RDW: 17.6 % — ABNORMAL HIGH (ref 11.5–15.5)
WBC: 9.8 10*3/uL (ref 4.0–10.5)
nRBC: 0 % (ref 0.0–0.2)

## 2021-08-02 LAB — LACTIC ACID, PLASMA: Lactic Acid, Venous: 0.6 mmol/L (ref 0.5–1.9)

## 2021-08-02 NOTE — Telephone Encounter (Signed)
APPROVED Effective Begin Date:09/06/2022Effective End Date:01/29/2022. Mr Behrend notified.

## 2021-08-02 NOTE — ED Notes (Signed)
Patient states he is leaving. 

## 2021-08-02 NOTE — Telephone Encounter (Signed)
Prior auth for Tramadol 50 mg #240 sent to [facility] Confirmation #:[id number] W.  Await response.

## 2021-08-02 NOTE — ED Notes (Signed)
Patient called for vitals x1 with no response 

## 2021-08-02 NOTE — Telephone Encounter (Deleted)
Prior auth for Tramadol 50 mg #240 sent to [facility] Confirmation #:[id number] W.  Await response.

## 2021-08-02 NOTE — ED Triage Notes (Addendum)
Pt c/o wound infection on his left leg abd buttocks. Pt also reports fever and chills.

## 2021-08-02 NOTE — ED Provider Notes (Signed)
Emergency Medicine Provider Triage Evaluation Note  [name] , a 30 y.o. male  was evaluated in triage.  Pt complains of wound to left thigh and buttocks.  Patient reports wound to left thigh has been getting progressively worse over time.  Wound to buttocks is new.  Patient reports increased discharge from wound to left thigh.  Patient endorses fevers and chills.  Reports fever of 104 earlier today.  Patient denies taking any Tylenol or ibuprofen.  Review of Systems  Positive: Wound, fevers, chills, purulent discharge Negative: Nausea, vomiting, abdominal pain  Physical Exam  BP (!) 141/101 (BP Location: Right Arm)   Pulse (!) 123   Temp 99 F (37.2 C) (Oral)   Resp 18   Ht 6' (1.829 m)   Wt 58 kg   SpO2 100%   BMI 17.34 kg/m  Gen:   Awake, no distress   Resp:  Normal effort  MSK:   Moves extremities without difficulty  Other:    Medical Decision Making  Medically screening exam initiated at 7:33 PM.  Appropriate orders placed.  [name] was informed that the remainder of the evaluation will be completed by another provider, this initial triage assessment does not replace that evaluation, and the importance of remaining in the ED until their evaluation is complete.     Berneice Heinrich 08/02/21 1934    Maia Plan, MD 08/02/21 2358

## 2021-08-03 ENCOUNTER — Other Ambulatory Visit: Payer: Self-pay

## 2021-08-03 ENCOUNTER — Encounter (HOSPITAL_COMMUNITY): Payer: Self-pay | Admitting: *Deleted

## 2021-08-03 ENCOUNTER — Emergency Department (HOSPITAL_COMMUNITY): Payer: Medicaid Other

## 2021-08-03 ENCOUNTER — Inpatient Hospital Stay (HOSPITAL_COMMUNITY)
Admission: EM | Admit: 2021-08-03 | Discharge: 2021-08-07 | DRG: 602 | Disposition: A | Discharge status: Home Health | Payer: Medicaid Other | Attending: Family Medicine | Admitting: Family Medicine

## 2021-08-03 ENCOUNTER — Telehealth: Payer: Self-pay | Admitting: *Deleted

## 2021-08-03 ENCOUNTER — Encounter (HOSPITAL_BASED_OUTPATIENT_CLINIC_OR_DEPARTMENT_OTHER): Payer: Medicaid Other | Admitting: Physician Assistant

## 2021-08-03 DIAGNOSIS — N529 Male erectile dysfunction, unspecified: Secondary | ICD-10-CM | POA: Diagnosis present

## 2021-08-03 DIAGNOSIS — F17210 Nicotine dependence, cigarettes, uncomplicated: Secondary | ICD-10-CM | POA: Diagnosis present

## 2021-08-03 DIAGNOSIS — L89154 Pressure ulcer of sacral region, stage 4: Secondary | ICD-10-CM | POA: Diagnosis present

## 2021-08-03 DIAGNOSIS — N390 Urinary tract infection, site not specified: Secondary | ICD-10-CM

## 2021-08-03 DIAGNOSIS — Z79891 Long term (current) use of opiate analgesic: Secondary | ICD-10-CM

## 2021-08-03 DIAGNOSIS — L089 Local infection of the skin and subcutaneous tissue, unspecified: Secondary | ICD-10-CM | POA: Diagnosis not present

## 2021-08-03 DIAGNOSIS — L03317 Cellulitis of buttock: Secondary | ICD-10-CM | POA: Diagnosis present

## 2021-08-03 DIAGNOSIS — Z20822 Contact with and (suspected) exposure to covid-19: Secondary | ICD-10-CM | POA: Diagnosis present

## 2021-08-03 DIAGNOSIS — Z833 Family history of diabetes mellitus: Secondary | ICD-10-CM | POA: Diagnosis not present

## 2021-08-03 DIAGNOSIS — N319 Neuromuscular dysfunction of bladder, unspecified: Secondary | ICD-10-CM | POA: Diagnosis present

## 2021-08-03 DIAGNOSIS — G8220 Paraplegia, unspecified: Secondary | ICD-10-CM | POA: Diagnosis present

## 2021-08-03 DIAGNOSIS — G8929 Other chronic pain: Secondary | ICD-10-CM | POA: Diagnosis present

## 2021-08-03 DIAGNOSIS — K592 Neurogenic bowel, not elsewhere classified: Secondary | ICD-10-CM | POA: Diagnosis present

## 2021-08-03 DIAGNOSIS — D509 Iron deficiency anemia, unspecified: Secondary | ICD-10-CM | POA: Diagnosis present

## 2021-08-03 DIAGNOSIS — Z888 Allergy status to other drugs, medicaments and biological substances status: Secondary | ICD-10-CM | POA: Diagnosis not present

## 2021-08-03 DIAGNOSIS — L8994 Pressure ulcer of unspecified site, stage 4: Secondary | ICD-10-CM | POA: Diagnosis not present

## 2021-08-03 DIAGNOSIS — A419 Sepsis, unspecified organism: Secondary | ICD-10-CM

## 2021-08-03 DIAGNOSIS — L8990 Pressure ulcer of unspecified site, unspecified stage: Secondary | ICD-10-CM | POA: Diagnosis present

## 2021-08-03 DIAGNOSIS — Z978 Presence of other specified devices: Secondary | ICD-10-CM

## 2021-08-03 LAB — URINALYSIS, ROUTINE W REFLEX MICROSCOPIC
Bilirubin Urine: NEGATIVE
Glucose, UA: NEGATIVE mg/dL
Hgb urine dipstick: NEGATIVE
Ketones, ur: NEGATIVE mg/dL
Nitrite: POSITIVE — AB
Protein, ur: 30 mg/dL — AB
Specific Gravity, Urine: 1.028 (ref 1.005–1.030)
pH: 5 (ref 5.0–8.0)

## 2021-08-03 LAB — CBC WITH DIFFERENTIAL/PLATELET
Abs Immature Granulocytes: 0.02 10*3/uL (ref 0.00–0.07)
Basophils Absolute: 0 10*3/uL (ref 0.0–0.1)
Basophils Relative: 0 %
Eosinophils Absolute: 0 10*3/uL (ref 0.0–0.5)
Eosinophils Relative: 0 %
HCT: 38.3 % — ABNORMAL LOW (ref 39.0–52.0)
Hemoglobin: 10.9 g/dL — ABNORMAL LOW (ref 13.0–17.0)
Immature Granulocytes: 0 %
Lymphocytes Relative: 18 %
Lymphs Abs: 1.6 10*3/uL (ref 0.7–4.0)
MCH: 20.2 pg — ABNORMAL LOW (ref 26.0–34.0)
MCHC: 28.5 g/dL — ABNORMAL LOW (ref 30.0–36.0)
MCV: 71.1 fL — ABNORMAL LOW (ref 80.0–100.0)
Monocytes Absolute: 0.7 10*3/uL (ref 0.1–1.0)
Monocytes Relative: 8 %
Neutro Abs: 6.8 10*3/uL (ref 1.7–7.7)
Neutrophils Relative %: 74 %
Platelets: 716 10*3/uL — ABNORMAL HIGH (ref 150–400)
RBC: 5.39 MIL/uL (ref 4.22–5.81)
RDW: 17.5 % — ABNORMAL HIGH (ref 11.5–15.5)
WBC: 9.1 10*3/uL (ref 4.0–10.5)
nRBC: 0 % (ref 0.0–0.2)

## 2021-08-03 LAB — CBC
HCT: 29.6 % — ABNORMAL LOW (ref 39.0–52.0)
Hemoglobin: 8.5 g/dL — ABNORMAL LOW (ref 13.0–17.0)
MCH: 20.3 pg — ABNORMAL LOW (ref 26.0–34.0)
MCHC: 28.7 g/dL — ABNORMAL LOW (ref 30.0–36.0)
MCV: 70.8 fL — ABNORMAL LOW (ref 80.0–100.0)
Platelets: 530 10*3/uL — ABNORMAL HIGH (ref 150–400)
RBC: 4.18 MIL/uL — ABNORMAL LOW (ref 4.22–5.81)
RDW: 17.3 % — ABNORMAL HIGH (ref 11.5–15.5)
WBC: 10.8 10*3/uL — ABNORMAL HIGH (ref 4.0–10.5)
nRBC: 0 % (ref 0.0–0.2)

## 2021-08-03 LAB — TROPONIN I (HIGH SENSITIVITY)
Troponin I (High Sensitivity): 4 ng/L (ref <18)
Troponin I (High Sensitivity): 3 ng/L (ref <18)

## 2021-08-03 LAB — COMPREHENSIVE METABOLIC PANEL
ALT: 17 U/L (ref 0–44)
AST: 13 U/L — ABNORMAL LOW (ref 15–41)
Albumin: 2.8 g/dL — ABNORMAL LOW (ref 3.5–5.0)
Alkaline Phosphatase: 96 U/L (ref 38–126)
Anion gap: 10 (ref 5–15)
BUN: 12 mg/dL (ref 6–20)
CO2: 26 mmol/L (ref 22–32)
Calcium: 9.2 mg/dL (ref 8.9–10.3)
Chloride: 101 mmol/L (ref 98–111)
Creatinine, Ser: 0.66 mg/dL (ref 0.61–1.24)
GFR, Estimated: >60 mL/min (ref >60)
Glucose, Bld: 90 mg/dL (ref 70–99)
Potassium: 3.9 mmol/L (ref 3.5–5.1)
Sodium: 137 mmol/L (ref 135–145)
Total Bilirubin: 0.2 mg/dL — ABNORMAL LOW (ref 0.3–1.2)
Total Protein: 8.8 g/dL — ABNORMAL HIGH (ref 6.5–8.1)

## 2021-08-03 LAB — CREATININE, SERUM
Creatinine, Ser: 0.7 mg/dL (ref 0.61–1.24)
GFR, Estimated: >60 mL/min (ref >60)

## 2021-08-03 LAB — PROTIME-INR
INR: 1.2 (ref 0.8–1.2)
Prothrombin Time: 14.9 seconds (ref 11.4–15.2)

## 2021-08-03 LAB — TSH: TSH: 0.727 u[IU]/mL (ref 0.350–4.500)

## 2021-08-03 LAB — LACTIC ACID, PLASMA
Lactic Acid, Venous: 3.8 mmol/L (ref 0.5–1.9)
Lactic Acid, Venous: 1.3 mmol/L (ref 0.5–1.9)

## 2021-08-03 LAB — APTT: aPTT: 31 seconds (ref 24–36)

## 2021-08-03 MED ORDER — LACTATED RINGERS IV SOLN: INTRAVENOUS | Status: AC

## 2021-08-03 MED ORDER — METRONIDAZOLE 500 MG/100ML IV SOLN
500.0000 mg | Freq: Two times a day (BID) | INTRAVENOUS | Status: DC
  Administered 2021-08-03 – 2021-08-04 (×2): 500 mg via INTRAVENOUS
  Filled 2021-08-03 (×2): qty 100

## 2021-08-03 MED ORDER — ACETAMINOPHEN 650 MG RE SUPP: 650.0000 mg | Freq: Four times a day (QID) | RECTAL | Status: DC | PRN

## 2021-08-03 MED ORDER — OXYCODONE HCL 5 MG PO TABS
10.0000 mg | ORAL_TABLET | Freq: Four times a day (QID) | ORAL | Status: DC | PRN
Start: 2021-08-03 — End: 2021-08-07
  Administered 2021-08-03 – 2021-08-07 (×8): 10 mg via ORAL
  Filled 2021-08-03 (×10): qty 2

## 2021-08-03 MED ORDER — SODIUM CHLORIDE 0.9 % IV SOLN
2.0000 g | Freq: Once | INTRAVENOUS | Status: AC
  Administered 2021-08-03: 2 g via INTRAVENOUS
  Filled 2021-08-03: qty 20

## 2021-08-03 MED ORDER — TIZANIDINE HCL 4 MG PO TABS: 4.0000 mg | ORAL_TABLET | Freq: Two times a day (BID) | ORAL | Status: DC

## 2021-08-03 MED ORDER — LACTATED RINGERS IV BOLUS (SEPSIS)
900.0000 mL | Freq: Once | INTRAVENOUS | Status: AC
  Administered 2021-08-03: 900 mL via INTRAVENOUS

## 2021-08-03 MED ORDER — ENOXAPARIN SODIUM 40 MG/0.4ML IJ SOSY
40.0000 mg | PREFILLED_SYRINGE | INTRAMUSCULAR | Status: DC
  Administered 2021-08-03 – 2021-08-06 (×4): 40 mg via SUBCUTANEOUS
  Filled 2021-08-03 (×4): qty 0.4

## 2021-08-03 MED ORDER — VANCOMYCIN HCL IN DEXTROSE 1-5 GM/200ML-% IV SOLN: 1000.0000 mg | Freq: Once | INTRAVENOUS | Status: DC

## 2021-08-03 MED ORDER — BACLOFEN 20 MG PO TABS
40.0000 mg | ORAL_TABLET | Freq: Four times a day (QID) | ORAL | Status: DC
  Administered 2021-08-03 – 2021-08-07 (×16): 40 mg via ORAL
  Filled 2021-08-03: qty 4
  Filled 2021-08-03 (×3): qty 2
  Filled 2021-08-03: qty 4
  Filled 2021-08-03 (×14): qty 2
  Filled 2021-08-03: qty 4

## 2021-08-03 MED ORDER — VANCOMYCIN HCL IN DEXTROSE 1-5 GM/200ML-% IV SOLN
1000.0000 mg | Freq: Two times a day (BID) | INTRAVENOUS | Status: DC
  Administered 2021-08-04 – 2021-08-05 (×3): 1000 mg via INTRAVENOUS
  Filled 2021-08-03 (×3): qty 200

## 2021-08-03 MED ORDER — LACTATED RINGERS IV SOLN: INTRAVENOUS | Status: DC

## 2021-08-03 MED ORDER — SODIUM CHLORIDE 0.9 % IV SOLN: 2.0000 g | Freq: Once | INTRAVENOUS | Status: DC

## 2021-08-03 MED ORDER — SODIUM CHLORIDE 0.9 % IV SOLN
2.0000 g | Freq: Three times a day (TID) | INTRAVENOUS | Status: DC
  Administered 2021-08-04 (×2): 2 g via INTRAVENOUS
  Filled 2021-08-03 (×2): qty 2

## 2021-08-03 MED ORDER — VANCOMYCIN HCL 1250 MG/250ML IV SOLN
1250.0000 mg | Freq: Once | INTRAVENOUS | Status: AC
  Administered 2021-08-03: 1250 mg via INTRAVENOUS
  Filled 2021-08-03: qty 250

## 2021-08-03 MED ORDER — ACETAMINOPHEN 325 MG PO TABS: 650.0000 mg | ORAL_TABLET | Freq: Four times a day (QID) | ORAL | Status: DC | PRN

## 2021-08-03 MED ORDER — LACTATED RINGERS IV BOLUS
1000.0000 mL | Freq: Once | INTRAVENOUS | Status: AC
  Administered 2021-08-03: 1000 mL via INTRAVENOUS

## 2021-08-03 MED ORDER — TRAMADOL HCL 50 MG PO TABS
100.0000 mg | ORAL_TABLET | Freq: Four times a day (QID) | ORAL | Status: DC
Start: 2021-08-03 — End: 2021-08-07
  Administered 2021-08-03 – 2021-08-07 (×16): 100 mg via ORAL
  Filled 2021-08-03 (×17): qty 2

## 2021-08-03 NOTE — ED Notes (Signed)
Pt states he took his tramadol, baclofen and oxycodone that he brought with him from pt. Pt states that he will not take the meds he has with him now that he knows staff will provide them to him.
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
## 2021-08-03 NOTE — ED Provider Notes (Signed)
Emergency Medicine Provider Triage Evaluation Note  [name] , a 30 y.o. male  was evaluated in triage.  Pt complains of ulcers that have been chronic for some time.  They are localized to the left hip right buttock and left buttock.  Was seen evaluated in the emergency department yesterday had a work-up but left prior to getting a room in the back.  Has had fevers up to 105.  Took ibuprofen prior to arrival.  Review of Systems  Positive: Chills, lower back pain  Negative: Shortness of breath, chest pain, leg pain, and leg swelling   Physical Exam  BP (!) 143/102 (BP Location: Left Arm)   Pulse (!) 118   Temp 98.8 F (37.1 C)   Resp 16   Ht 6' (1.829 m)   Wt 57.6 kg   SpO2 100%   BMI 17.22 kg/m  Gen:   Awake, no distress   Resp:  Normal effort  MSK:   Moves extremities without difficulty  Other:  Unable to examine ulcers as patient was unwilling.  Medical Decision Making  Medically screening exam initiated at 3:50 PM.  Appropriate orders placed.  [name] was informed that the remainder of the evaluation will be completed by another provider, this initial triage assessment does not replace that evaluation, and the importance of remaining in the ED until their evaluation is complete.  Showed me pictures from early August of the ulcers and they appear to be stage III.  Again I was unable to examine them in the room as he would not let me after urging him multiple times.   Honor Loh [place], PA-C 08/03/21 1554    Gerhard Munch, MD 08/03/21 [phone]

## 2021-08-03 NOTE — ED Notes (Signed)
The pt is w/c boubd he has multiple ulvers over his body  hes here today for a new one  He has a doctor who treats them who told the pt to come in here today.  He has a  Catheter he has a small bag attached to it

## 2021-08-03 NOTE — ED Notes (Signed)
Pt incontinent of bowels. RN cleaned pt and cleaned around wounds. Three open wounds noted to pt bottom. One very large deep wound on right hip. One one inch open wound noted below left buttock. Another large open wound noted to the left hip. Pt requesting dressing be applied to wound to left hip. ABD pad applied. New linen and brief changed.

## 2021-08-03 NOTE — Sepsis Progress Note (Signed)
Following per sepsis protocol   

## 2021-08-03 NOTE — Progress Notes (Addendum)
Pharmacy Antibiotic Note  [name] is a 30 y.o. male admitted on 08/03/2021 with cellulitis.  Pharmacy has been consulted for cefepime and vancomycin dosing.  SCr 0.66 - baseline WBC 9.1, afebrile  Plan: Cefepime 2g q8h Give vancomycin 1250 mg IV x1 Vancomycin 1000 mg IV every 12 hours. Goal trough 10-15 mcg/mL - eAUC 451.5, SCr used 0.66, Cssmax 32.4 Monitor renal function, levels, cultures and susceptibilities - de-escalate as able   Height: 5\' 11"  (180.3 cm) Weight: 61.2 kg (135 lb) IBW/kg (Calculated) : 75.3  Temp (24hrs), Avg:99 F (37.2 C), Min:98.6 F (37 C), Max:99.4 F (37.4 C)  Recent Labs  Lab 08/02/21 1941 08/03/21 1555  WBC 9.8 9.1  CREATININE 0.66 0.66  LATICACIDVEN 0.6 3.8*    Estimated Creatinine Clearance: 115.8 mL/min (by C-G formula based on SCr of 0.66 mg/dL).    Allergies  Allergen Reactions   Gabapentin Other (See Comments)    Severe dizziness and vertigo/lightheadedness- couldn't sit up while on it Pt states he is not allergic to gabapentin 04/27/21    Antimicrobials this admission: Ceftriaxone x 1 in ED on 9/7  Cefepime 9/7 >> Vancomycin 9/7 >>  Dose adjustments this admission: N/A  Microbiology results: 9/7 BCx: pending 9/7 UCx: pending   Thank you for allowing pharmacy to be a part of this patient's care.  11/7, PharmD PGY1 Pharmacy Resident 08/03/2021  7:22 PM  Please check [email or web address] for unit-specific pharmacy phone numbers.

## 2021-08-03 NOTE — ED Triage Notes (Signed)
Pt states multiple stage 4 ulcers.  Was here yesterday, but the wait was too long.

## 2021-08-03 NOTE — ED Notes (Signed)
Pt provided with sprite and [place] sandwich.

## 2021-08-03 NOTE — ED Notes (Signed)
Pt going to xray  

## 2021-08-03 NOTE — ED Notes (Signed)
Critical lactic acid reported to Dr. Wallace Cullens
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
## 2021-08-04 ENCOUNTER — Encounter (HOSPITAL_COMMUNITY): Payer: Self-pay | Admitting: Internal Medicine

## 2021-08-04 ENCOUNTER — Inpatient Hospital Stay (HOSPITAL_COMMUNITY): Payer: Medicaid Other

## 2021-08-04 DIAGNOSIS — L089 Local infection of the skin and subcutaneous tissue, unspecified: Secondary | ICD-10-CM

## 2021-08-04 DIAGNOSIS — L8994 Pressure ulcer of unspecified site, stage 4: Secondary | ICD-10-CM

## 2021-08-04 LAB — COMPREHENSIVE METABOLIC PANEL
ALT: 13 U/L (ref 0–44)
AST: 16 U/L (ref 15–41)
Albumin: 2 g/dL — ABNORMAL LOW (ref 3.5–5.0)
Alkaline Phosphatase: 66 U/L (ref 38–126)
Anion gap: 6 (ref 5–15)
BUN: 14 mg/dL (ref 6–20)
CO2: 24 mmol/L (ref 22–32)
Calcium: 8.1 mg/dL — ABNORMAL LOW (ref 8.9–10.3)
Chloride: 104 mmol/L (ref 98–111)
Creatinine, Ser: 0.73 mg/dL (ref 0.61–1.24)
GFR, Estimated: >60 mL/min (ref >60)
Glucose, Bld: 189 mg/dL — ABNORMAL HIGH (ref 70–99)
Potassium: 3.7 mmol/L (ref 3.5–5.1)
Sodium: 134 mmol/L — ABNORMAL LOW (ref 135–145)
Total Bilirubin: <0.1 mg/dL — ABNORMAL LOW (ref 0.3–1.2)
Total Protein: 6.5 g/dL (ref 6.5–8.1)

## 2021-08-04 LAB — CBC
HCT: 29.1 % — ABNORMAL LOW (ref 39.0–52.0)
Hemoglobin: 8.4 g/dL — ABNORMAL LOW (ref 13.0–17.0)
MCH: 20.7 pg — ABNORMAL LOW (ref 26.0–34.0)
MCHC: 28.9 g/dL — ABNORMAL LOW (ref 30.0–36.0)
MCV: 71.7 fL — ABNORMAL LOW (ref 80.0–100.0)
Platelets: 530 10*3/uL — ABNORMAL HIGH (ref 150–400)
RBC: 4.06 MIL/uL — ABNORMAL LOW (ref 4.22–5.81)
RDW: 17.2 % — ABNORMAL HIGH (ref 11.5–15.5)
WBC: 8.8 10*3/uL (ref 4.0–10.5)
nRBC: 0 % (ref 0.0–0.2)

## 2021-08-04 LAB — SARS CORONAVIRUS 2 (TAT 6-24 HRS): SARS Coronavirus 2: NEGATIVE

## 2021-08-04 MED ORDER — DAKINS (1/4 STRENGTH) 0.125 % EX SOLN
Freq: Two times a day (BID) | CUTANEOUS | Status: DC
  Filled 2021-08-04 (×2): qty 473

## 2021-08-04 MED ORDER — SODIUM CHLORIDE 0.9 % IV SOLN
1.0000 g | Freq: Three times a day (TID) | INTRAVENOUS | Status: DC
  Filled 2021-08-04: qty 1

## 2021-08-04 MED ORDER — SODIUM CHLORIDE 0.9 % IV SOLN
1.0000 g | Freq: Three times a day (TID) | INTRAVENOUS | Status: DC
  Administered 2021-08-04 – 2021-08-07 (×10): 1 g via INTRAVENOUS
  Filled 2021-08-04 (×12): qty 1

## 2021-08-04 MED ORDER — IOHEXOL 350 MG/ML SOLN
75.0000 mL | Freq: Once | INTRAVENOUS | Status: AC | PRN
  Administered 2021-08-04: 75 mL via INTRAVENOUS
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
## 2021-08-04 NOTE — ED Notes (Signed)
Report given to Marcy Siren, RN of [phone]

## 2021-08-04 NOTE — ED Notes (Addendum)
Was informed by CT tech that pt refused to go to CT at this time. Pt requested to wait until later in the morning because he was sleeping. MD notified.

## 2021-08-04 NOTE — ED Notes (Signed)
Warmed patient lunch up patient is sitting up eating with call bell in reach
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
## 2021-08-04 NOTE — Consult Note (Signed)
WOC Nurse Consult Note: Patient receiving care in [facility] ED 28. Reason for Consult: sacral wound, stool incontinence Wound type: Stage 4 PIs to buttocks/sacrum--see photos Pressure Injury POA: Yes Measurement: To be provided by the bedside RN in the flowsheet section  Wound bed: Drainage (amount, consistency, odor)  Periwound: Dressing procedure/placement/frequency: I have ordered 6 days of bid 1/4% Dakin's solution moistened roll gauze (Kerlix) with solution, place into each wound on the buttocks/sacrum. Cover with foam dressing.  I was hoping to have the CT scan results of the area to determine if the pelvis shows any signs of osteomyelitis.  Unfortunately, the patient refused the CT scan because he was sleeping according to the ED nurse note.  The Dakin's will help reduce microbial load of the wounds, but it will not "solve" osteomyelitis should it be determined he has this condition. WOC nurse will not follow at this time.  Please re-consult the WOC team if needed.  Helmut Muster, RN, MSN, CWOCN, CNS-BC, pager [phone]

## 2021-08-04 NOTE — ED Notes (Signed)
Attempted to call report to [phone]

## 2021-08-04 NOTE — Progress Notes (Addendum)
Pharmacy Antibiotic Note  [name] is a 30 y.o. male admitted on 08/03/2021 with sacral ulcer pain and increased discharge, follows with ID, started on vancomycin and cefepime in ED. Has hx of more resistant pseudomonas and will switch from cefepime to Merrem per ID.    Plan: Merrem 1g IV q 8h Monitor renal function, Cx and ID recs for LOT and ability to narrow  Height: 5\' 11"  (180.3 cm) Weight: 61.2 kg (135 lb) IBW/kg (Calculated) : 75.3  Temp (24hrs), Avg:99.1 F (37.3 C), Min:98.8 F (37.1 C), Max:99.4 F (37.4 C)  Recent Labs  Lab 08/02/21 1941 08/03/21 1555 08/03/21 2221 08/04/21 0411  WBC 9.8 9.1 10.8* 8.8  CREATININE 0.66 0.66 0.70 0.73  LATICACIDVEN 0.6 3.8* 1.3  --     Estimated Creatinine Clearance: 115.8 mL/min (by C-G formula based on SCr of 0.73 mg/dL).    Allergies  Allergen Reactions   Gabapentin Other (See Comments)    Severe dizziness and vertigo/lightheadedness- couldn't sit up while on it Pt states he is not allergic to gabapentin 04/27/21    Vanc 9/7>> Cefepime 9/7>>9/8 Merrem 9/8>> Flagyl 9/7>>   9/7 BCx: sent  11/7, PharmD Clinical Pharmacist ED Pharmacist Phone # [phone] 08/04/2021 11:27 AM

## 2021-08-05 ENCOUNTER — Encounter: Payer: Medicaid Other | Admitting: Physical Medicine and Rehabilitation

## 2021-08-05 ENCOUNTER — Inpatient Hospital Stay: Payer: Self-pay

## 2021-08-05 LAB — CBC WITH DIFFERENTIAL/PLATELET
Abs Immature Granulocytes: 0.02 10*3/uL (ref 0.00–0.07)
Basophils Absolute: 0 10*3/uL (ref 0.0–0.1)
Basophils Relative: 1 %
Eosinophils Absolute: 0.1 10*3/uL (ref 0.0–0.5)
Eosinophils Relative: 2 %
HCT: 30.5 % — ABNORMAL LOW (ref 39.0–52.0)
Hemoglobin: 9 g/dL — ABNORMAL LOW (ref 13.0–17.0)
Immature Granulocytes: 0 %
Lymphocytes Relative: 27 %
Lymphs Abs: 1.8 10*3/uL (ref 0.7–4.0)
MCH: 20.7 pg — ABNORMAL LOW (ref 26.0–34.0)
MCHC: 29.5 g/dL — ABNORMAL LOW (ref 30.0–36.0)
MCV: 70.3 fL — ABNORMAL LOW (ref 80.0–100.0)
Monocytes Absolute: 0.7 10*3/uL (ref 0.1–1.0)
Monocytes Relative: 11 %
Neutro Abs: 3.8 10*3/uL (ref 1.7–7.7)
Neutrophils Relative %: 59 %
Platelets: 509 10*3/uL — ABNORMAL HIGH (ref 150–400)
RBC: 4.34 MIL/uL (ref 4.22–5.81)
RDW: 17.1 % — ABNORMAL HIGH (ref 11.5–15.5)
WBC: 6.4 10*3/uL (ref 4.0–10.5)
nRBC: 0 % (ref 0.0–0.2)

## 2021-08-05 LAB — COMPREHENSIVE METABOLIC PANEL
ALT: 12 U/L (ref 0–44)
AST: 11 U/L — ABNORMAL LOW (ref 15–41)
Albumin: 2.1 g/dL — ABNORMAL LOW (ref 3.5–5.0)
Alkaline Phosphatase: 65 U/L (ref 38–126)
Anion gap: 8 (ref 5–15)
BUN: 8 mg/dL (ref 6–20)
CO2: 28 mmol/L (ref 22–32)
Calcium: 8.7 mg/dL — ABNORMAL LOW (ref 8.9–10.3)
Chloride: 98 mmol/L (ref 98–111)
Creatinine, Ser: 0.72 mg/dL (ref 0.61–1.24)
GFR, Estimated: >60 mL/min (ref >60)
Glucose, Bld: 80 mg/dL (ref 70–99)
Potassium: 3.7 mmol/L (ref 3.5–5.1)
Sodium: 134 mmol/L — ABNORMAL LOW (ref 135–145)
Total Bilirubin: 0.5 mg/dL (ref 0.3–1.2)
Total Protein: 6.6 g/dL (ref 6.5–8.1)

## 2021-08-05 LAB — MAGNESIUM: Magnesium: 1.8 mg/dL (ref 1.7–2.4)

## 2021-08-05 LAB — PHOSPHORUS: Phosphorus: 4.2 mg/dL (ref 2.5–4.6)

## 2021-08-05 MED ORDER — CHLORHEXIDINE GLUCONATE CLOTH 2 % EX PADS
6.0000 | MEDICATED_PAD | Freq: Every day | CUTANEOUS | Status: DC
  Administered 2021-08-05 – 2021-08-07 (×3): 6 via TOPICAL

## 2021-08-05 MED ORDER — PROSOURCE PLUS PO LIQD
30.0000 mL | Freq: Three times a day (TID) | ORAL | Status: DC
  Administered 2021-08-05 – 2021-08-07 (×6): 30 mL via ORAL
  Filled 2021-08-05 (×6): qty 30

## 2021-08-05 MED ORDER — ADULT MULTIVITAMIN W/MINERALS CH
1.0000 | ORAL_TABLET | Freq: Every day | ORAL | Status: DC
  Administered 2021-08-05 – 2021-08-07 (×3): 1 via ORAL
  Filled 2021-08-05 (×3): qty 1

## 2021-08-05 MED ORDER — ENSURE ENLIVE PO LIQD
237.0000 mL | Freq: Two times a day (BID) | ORAL | Status: DC
  Administered 2021-08-06 – 2021-08-07 (×4): 237 mL via ORAL

## 2021-08-05 MED ORDER — ENSURE SURGERY PO LIQD
237.0000 mL | ORAL | Status: DC
  Administered 2021-08-05 – 2021-08-07 (×3): 237 mL via ORAL
  Filled 2021-08-05 (×3): qty 237

## 2021-08-05 MED ORDER — MEROPENEM IV (FOR PTA / DISCHARGE USE ONLY): 1.0000 g | Freq: Three times a day (TID) | INTRAVENOUS | 0 refills | Status: DC

## 2021-08-05 NOTE — Progress Notes (Signed)
ID Brief Note  OK to place a PICC once blood cx 9/7 negative in 48 hrs Plan for Meropenem for 10 days course for SSTI Continue wound care/offloading and nutrition ID will sign off for now, please call with questions.   Odette Fraction, MD Infectious Disease Physician [facility] [address] [place], [place] [address] Phone: [phone]  Fax: [phone]

## 2021-08-05 NOTE — Progress Notes (Signed)
PHARMACY CONSULT NOTE FOR:  OUTPATIENT  PARENTERAL ANTIBIOTIC THERAPY (OPAT)  Indication: Sacral Ulcer Cellulitis Regimen: Meropenem 1g IV q8h End date: 08/13/21  IV antibiotic discharge orders are pended. To discharging provider:  please sign these orders via discharge navigator,  Select New Orders & click on the button choice - Manage This Unsigned Work.     Thank you for allowing pharmacy to be a part of this patient's care.  Shirlee More, PharmD PGY2 Infectious Diseases Pharmacy Resident   Please check [email or web address] for unit-specific pharmacy phone numbers
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
## 2021-08-06 LAB — CBC WITH DIFFERENTIAL/PLATELET
Abs Immature Granulocytes: 0.02 10*3/uL (ref 0.00–0.07)
Basophils Absolute: 0 10*3/uL (ref 0.0–0.1)
Basophils Relative: 1 %
Eosinophils Absolute: 0.1 10*3/uL (ref 0.0–0.5)
Eosinophils Relative: 2 %
HCT: 30.3 % — ABNORMAL LOW (ref 39.0–52.0)
Hemoglobin: 8.7 g/dL — ABNORMAL LOW (ref 13.0–17.0)
Immature Granulocytes: 0 %
Lymphocytes Relative: 37 %
Lymphs Abs: 2.2 10*3/uL (ref 0.7–4.0)
MCH: 20.3 pg — ABNORMAL LOW (ref 26.0–34.0)
MCHC: 28.7 g/dL — ABNORMAL LOW (ref 30.0–36.0)
MCV: 70.8 fL — ABNORMAL LOW (ref 80.0–100.0)
Monocytes Absolute: 0.6 10*3/uL (ref 0.1–1.0)
Monocytes Relative: 9 %
Neutro Abs: 3.1 10*3/uL (ref 1.7–7.7)
Neutrophils Relative %: 51 %
Platelets: 519 10*3/uL — ABNORMAL HIGH (ref 150–400)
RBC: 4.28 MIL/uL (ref 4.22–5.81)
RDW: 17.2 % — ABNORMAL HIGH (ref 11.5–15.5)
WBC: 6 10*3/uL (ref 4.0–10.5)
nRBC: 0 % (ref 0.0–0.2)

## 2021-08-06 LAB — URINE CULTURE: Culture: >=100000 — AB

## 2021-08-06 LAB — COMPREHENSIVE METABOLIC PANEL
ALT: 12 U/L (ref 0–44)
AST: 10 U/L — ABNORMAL LOW (ref 15–41)
Albumin: 2 g/dL — ABNORMAL LOW (ref 3.5–5.0)
Alkaline Phosphatase: 70 U/L (ref 38–126)
Anion gap: 6 (ref 5–15)
BUN: 8 mg/dL (ref 6–20)
CO2: 29 mmol/L (ref 22–32)
Calcium: 8.3 mg/dL — ABNORMAL LOW (ref 8.9–10.3)
Chloride: 102 mmol/L (ref 98–111)
Creatinine, Ser: 0.73 mg/dL (ref 0.61–1.24)
GFR, Estimated: >60 mL/min (ref >60)
Glucose, Bld: 167 mg/dL — ABNORMAL HIGH (ref 70–99)
Potassium: 3.8 mmol/L (ref 3.5–5.1)
Sodium: 137 mmol/L (ref 135–145)
Total Bilirubin: 0.2 mg/dL — ABNORMAL LOW (ref 0.3–1.2)
Total Protein: 6.4 g/dL — ABNORMAL LOW (ref 6.5–8.1)

## 2021-08-06 LAB — IRON AND TIBC
Iron: 27 ug/dL — ABNORMAL LOW (ref 45–182)
Saturation Ratios: 17 % — ABNORMAL LOW (ref 17.9–39.5)
TIBC: 161 ug/dL — ABNORMAL LOW (ref 250–450)
UIBC: 134 ug/dL

## 2021-08-06 LAB — VITAMIN B12: Vitamin B-12: 217 pg/mL (ref 180–914)

## 2021-08-06 LAB — FOLATE: Folate: 9.1 ng/mL (ref >5.9)

## 2021-08-06 LAB — FERRITIN: Ferritin: 119 ng/mL (ref 24–336)

## 2021-08-06 MED ORDER — FERROUS SULFATE 325 (65 FE) MG PO TABS
325.0000 mg | ORAL_TABLET | Freq: Every day | ORAL | Status: DC
  Administered 2021-08-07: 325 mg via ORAL
  Filled 2021-08-06: qty 1

## 2021-08-06 MED ORDER — VITAMIN B-12 1000 MCG PO TABS
1000.0000 ug | ORAL_TABLET | Freq: Every day | ORAL | Status: DC
  Administered 2021-08-06 – 2021-08-07 (×2): 1000 ug via ORAL
  Filled 2021-08-06 (×2): qty 1

## 2021-08-06 MED ORDER — SODIUM CHLORIDE 0.9% FLUSH: 10.0000 mL | INTRAVENOUS | Status: DC | PRN

## 2021-08-06 MED ORDER — SODIUM CHLORIDE 0.9% FLUSH
10.0000 mL | Freq: Two times a day (BID) | INTRAVENOUS | Status: DC
Start: 2021-08-06 — End: 2021-08-07
  Administered 2021-08-06 – 2021-08-07 (×2): 10 mL

## 2021-08-06 MED ORDER — MEROPENEM IV (FOR PTA / DISCHARGE USE ONLY): 1.0000 g | Freq: Three times a day (TID) | INTRAVENOUS | 0 refills | Status: AC

## 2021-08-06 NOTE — Progress Notes (Signed)
Peripherally Inserted Central Catheter Placement  The IV Nurse has discussed with the patient and/or persons authorized to consent for the patient, the purpose of this procedure and the potential benefits and risks involved with this procedure.  The benefits include less needle sticks, lab draws from the catheter, and the patient may be discharged home with the catheter. Risks include, but not limited to, infection, bleeding, blood clot (thrombus formation), and puncture of an artery; nerve damage and irregular heartbeat and possibility to perform a PICC exchange if needed/ordered by physician.  Alternatives to this procedure were also discussed.  Bard Power PICC patient education guide, fact sheet on infection prevention and patient information card has been provided to patient /or left at bedside.    PICC Placement Documentation  PICC Single Lumen 05/17/21 Right Brachial 36 cm 0 cm (Active)     PICC Single Lumen 08/06/21 Right Brachial 39 cm 0 cm (Active)  Indication for Insertion or Continuance of Line Home intravenous therapies (PICC only) 08/06/21 1749  Exposed Catheter (cm) 0 cm 08/06/21 1749  Site Assessment Clean;Dry;Intact 08/06/21 1749  Line Status Flushed;Saline locked;Blood return noted 08/06/21 1749  Dressing Type Transparent 08/06/21 1749  Dressing Status Clean;Dry;Intact 08/06/21 1749  Antimicrobial disc in place? Yes 08/06/21 1749  Safety Lock Not Applicable 08/06/21 1749  Line Care Tubing changed;Connections checked and tightened 08/06/21 1749  Line Adjustment (NICU/IV Team Only) No 08/06/21 1749  Dressing Intervention New dressing 08/06/21 1749  Dressing Change Due 08/13/21 08/06/21 1749       Elliot Dally 08/06/2021, 5:51 PM
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
## 2021-08-06 NOTE — Progress Notes (Signed)
Multiple secure chat conversations with Dr Lowell Guitar and Adela Lank RN re PICC vs midline for remainder of IV therapy.  Dr Lowell Guitar recommends to proceed with PICC due to pt complicated hx and potential need to extend therapies. Will place PICC for d/c medication needs.

## 2021-08-06 NOTE — Plan of Care (Signed)
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
## 2021-08-07 LAB — CBC WITH DIFFERENTIAL/PLATELET
Abs Immature Granulocytes: 0.01 10*3/uL (ref 0.00–0.07)
Basophils Absolute: 0 10*3/uL (ref 0.0–0.1)
Basophils Relative: 1 %
Eosinophils Absolute: 0.1 10*3/uL (ref 0.0–0.5)
Eosinophils Relative: 2 %
HCT: 32.8 % — ABNORMAL LOW (ref 39.0–52.0)
Hemoglobin: 9.6 g/dL — ABNORMAL LOW (ref 13.0–17.0)
Immature Granulocytes: 0 %
Lymphocytes Relative: 43 %
Lymphs Abs: 2.6 10*3/uL (ref 0.7–4.0)
MCH: 20.7 pg — ABNORMAL LOW (ref 26.0–34.0)
MCHC: 29.3 g/dL — ABNORMAL LOW (ref 30.0–36.0)
MCV: 70.8 fL — ABNORMAL LOW (ref 80.0–100.0)
Monocytes Absolute: 0.5 10*3/uL (ref 0.1–1.0)
Monocytes Relative: 8 %
Neutro Abs: 2.8 10*3/uL (ref 1.7–7.7)
Neutrophils Relative %: 46 %
Platelets: 601 10*3/uL — ABNORMAL HIGH (ref 150–400)
RBC: 4.63 MIL/uL (ref 4.22–5.81)
RDW: 17.3 % — ABNORMAL HIGH (ref 11.5–15.5)
WBC: 6 10*3/uL (ref 4.0–10.5)
nRBC: 0 % (ref 0.0–0.2)

## 2021-08-07 LAB — COMPREHENSIVE METABOLIC PANEL
ALT: 13 U/L (ref 0–44)
AST: 13 U/L — ABNORMAL LOW (ref 15–41)
Albumin: 2.3 g/dL — ABNORMAL LOW (ref 3.5–5.0)
Alkaline Phosphatase: 79 U/L (ref 38–126)
Anion gap: 5 (ref 5–15)
BUN: 12 mg/dL (ref 6–20)
CO2: 29 mmol/L (ref 22–32)
Calcium: 8.7 mg/dL — ABNORMAL LOW (ref 8.9–10.3)
Chloride: 99 mmol/L (ref 98–111)
Creatinine, Ser: 0.57 mg/dL — ABNORMAL LOW (ref 0.61–1.24)
GFR, Estimated: >60 mL/min (ref >60)
Glucose, Bld: 111 mg/dL — ABNORMAL HIGH (ref 70–99)
Potassium: 4.4 mmol/L (ref 3.5–5.1)
Sodium: 133 mmol/L — ABNORMAL LOW (ref 135–145)
Total Bilirubin: 0.2 mg/dL — ABNORMAL LOW (ref 0.3–1.2)
Total Protein: 7.3 g/dL (ref 6.5–8.1)

## 2021-08-07 LAB — CULTURE, BLOOD (SINGLE): Culture: NO GROWTH

## 2021-08-07 LAB — PHOSPHORUS: Phosphorus: 3.6 mg/dL (ref 2.5–4.6)

## 2021-08-07 LAB — SEDIMENTATION RATE: Sed Rate: 45 mm/hr — ABNORMAL HIGH (ref 0–16)

## 2021-08-07 LAB — C-REACTIVE PROTEIN: CRP: 3.3 mg/dL — ABNORMAL HIGH (ref <1.0)

## 2021-08-07 LAB — MAGNESIUM: Magnesium: 1.8 mg/dL (ref 1.7–2.4)

## 2021-08-07 MED ORDER — CYANOCOBALAMIN 1000 MCG PO TABS: 1000.0000 ug | ORAL_TABLET | Freq: Every day | ORAL | 0 refills | Status: AC

## 2021-08-07 MED ORDER — FERROUS SULFATE 325 (65 FE) MG PO TABS: 325.0000 mg | ORAL_TABLET | Freq: Every day | ORAL | 0 refills | Status: DC

## 2021-08-07 MED ORDER — DAKINS (1/4 STRENGTH) 0.125 % EX SOLN: Freq: Two times a day (BID) | CUTANEOUS | 0 refills | Status: AC
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
## 2021-08-07 NOTE — Plan of Care (Signed)

## 2021-08-07 NOTE — TOC Transition Note (Signed)
Transition of Care [facility]) - CM/SW Discharge Note   Patient Details  Name: [name] MRN: [id number] Date of Birth: 1990-12-18  Transition of Care [facility]) CM/SW Contact:  [name], RN Phone Number: 08/07/2021, 2:25 PM   Clinical Narrative:    Verified with Jeri Modena, [facility], that patient's infusions are set up for home and patient has been educated for administration. No other CM needs identified.      Final next level of care: Home w Home Health Services Barriers to Discharge: No Barriers Identified   Patient Goals and CMS Choice        Discharge Placement                       Discharge Plan and Services   Discharge Planning Services: CM Consult            DME Arranged: IV pump/equipment         HH Arranged: RN   Date HH Agency Contacted: 08/06/21 Time HH Agency Contacted: 1339 Representative spoke with at [facility] Agency: Tonny Bollman will set up [facility] or [facility]  Social Determinants of Health (SDOH) Interventions     Readmission Risk Interventions Readmission Risk Prevention Plan 10/15/2020  Transportation Screening Complete  PCP or Specialist Appt within 5-7 Days Complete  Home Care Screening Complete  Medication Review (RN CM) Complete  Some recent data might be hidden

## 2021-08-07 NOTE — Progress Notes (Addendum)
Pharmacy Antibiotic Note  [name] is a 30 y.o. male admitted on 08/03/2021 with  sacral ulcer with osteo .  Pharmacy has been consulted for Merrem dosing.  ID: Hx sacral ulcer with osteo (follows with ID), inc discharge, sepsis - Afebrile. WBC WNL - 9/10: PICC placed  Vanc 9/7>>9/9 Cefepime 9/7>>9/9 Meropenem 9/8>> (10d) Flagyl 9/7>>9/9  9/7 BCx: IP 9/7 urine - > pseudomonas   Plan: Merrem 1g IV q8hr. Dr Lowell Guitar notes: appt made with Dr. Elinor Parkinson on 9/19.  Will extend abx to this date.    Height: 5\' 11"  (180.3 cm) Weight: 61.2 kg (135 lb) IBW/kg (Calculated) : 75.3  Temp (24hrs), Avg:98.1 F (36.7 C), Min:97.6 F (36.4 C), Max:98.7 F (37.1 C)  Recent Labs  Lab 08/02/21 1941 08/03/21 1555 08/03/21 2221 08/04/21 0411 08/05/21 0326 08/06/21 0048 08/07/21 0337  WBC 9.8 9.1 10.8* 8.8 6.4 6.0 6.0  CREATININE 0.66 0.66 0.70 0.73 0.72 0.73 0.57*  LATICACIDVEN 0.6 3.8* 1.3  --   --   --   --     Estimated Creatinine Clearance: 115.8 mL/min (A) (by C-G formula based on SCr of 0.57 mg/dL (L)).    Allergies  Allergen Reactions   Gabapentin Other (See Comments)    Severe dizziness and vertigo/lightheadedness- couldn't sit up while on it Pt states he is not allergic to gabapentin 04/27/21     [name] Banka S. 06/27/21, PharmD, BCPS Clinical Staff Pharmacist [email or web address] Merilynn Finland 08/07/2021 2:18 PM

## 2021-08-08 LAB — CULTURE, BLOOD (ROUTINE X 2): Culture: NO GROWTH

## 2021-08-09 LAB — METHYLMALONIC ACID, SERUM: Methylmalonic Acid, Quantitative: 242 nmol/L (ref 0–378)

## 2021-08-15 ENCOUNTER — Ambulatory Visit: Payer: Medicaid Other | Admitting: Infectious Diseases

## 2021-08-18 ENCOUNTER — Ambulatory Visit: Payer: Self-pay | Admitting: Nurse Practitioner

## 2021-08-24 ENCOUNTER — Other Ambulatory Visit: Payer: Self-pay

## 2021-08-24 ENCOUNTER — Encounter (HOSPITAL_BASED_OUTPATIENT_CLINIC_OR_DEPARTMENT_OTHER): Payer: Medicaid Other | Attending: Physician Assistant | Admitting: Physician Assistant

## 2021-08-24 DIAGNOSIS — Z681 Body mass index (BMI) 19 or less, adult: Secondary | ICD-10-CM | POA: Diagnosis not present

## 2021-08-24 DIAGNOSIS — L89314 Pressure ulcer of right buttock, stage 4: Secondary | ICD-10-CM | POA: Insufficient documentation

## 2021-08-24 DIAGNOSIS — G8221 Paraplegia, complete: Secondary | ICD-10-CM | POA: Insufficient documentation

## 2021-08-24 DIAGNOSIS — L89223 Pressure ulcer of left hip, stage 3: Secondary | ICD-10-CM | POA: Diagnosis not present

## 2021-08-24 DIAGNOSIS — E43 Unspecified severe protein-calorie malnutrition: Secondary | ICD-10-CM | POA: Insufficient documentation

## 2021-08-24 DIAGNOSIS — Z86718 Personal history of other venous thrombosis and embolism: Secondary | ICD-10-CM | POA: Diagnosis not present

## 2021-08-24 DIAGNOSIS — F17200 Nicotine dependence, unspecified, uncomplicated: Secondary | ICD-10-CM | POA: Diagnosis not present

## 2021-08-24 DIAGNOSIS — M8668 Other chronic osteomyelitis, other site: Secondary | ICD-10-CM | POA: Diagnosis not present

## 2021-08-24 DIAGNOSIS — L89324 Pressure ulcer of left buttock, stage 4: Secondary | ICD-10-CM | POA: Diagnosis not present

## 2021-08-24 NOTE — Progress Notes (Addendum)
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 depending on the results or recommendations we may look towards initiation of the wound VAC for left trochanter location going forward. Again a lot will depend on honestly where exactly we stand in that regard. The patient voiced understanding. 3. I would also recommend that he needs to be very conscientious about  offloading. Right now the biggest issue I see is that we have continued to have problems with new areas popping up once we get wound healed or very close to getting when healed. That is not good and not the direction we want to take care. We will see patient back for reevaluation in 1 week here in the clinic. If anything worsens or changes patient will contact our office for additional recommendations. Electronic Signature(s) Signed: 08/26/2021 4:31:47 PM By: Lenda Kelp PA-C Entered By: Lenda Kelp on 08/26/2021 16:31:46 -------------------------------------------------------------------------------- SuperBill Details Patient Name: Date of Service: [name] 08/24/2021 Medical Record Number: [id number] Patient Account Number: [id number] Date of Birth/Sex: Treating RN: 08-14-1991 (30 y.o. 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, other site Facility Procedures CPT4 Code: 16109604 [id number] Description: 215 - WOUND CARE VISIT-LEV 5 EST PT 1 Modifier: Quantity: Physician Procedures : CPT4 Code Description Modifier [id number] - WC PHYS LEVEL 4 - EST PT ICD-10 Diagnosis Description L89.314 Pressure ulcer of right buttock, stage 4 L89.324 Pressure ulcer of left buttock, stage 4 L89.223 Pressure ulcer of left hip, stage 3 E43  Unspecified severe protein-calorie malnutrition Quantity: 1 Electronic Signature(s) Signed: 08/26/2021 4:32:13 PM By: Lenda Kelp PA-C Previous Signature: 08/24/2021 5:34:22 PM Version By: Antonieta Iba Entered By: Lenda Kelp on 08/26/2021 16:32:13

## 2021-08-24 NOTE — Progress Notes (Signed)
[name], [name] ([id number]) Visit Report for 08/24/2021 Arrival Information Details Patient Name: Date of Service: [name], [name] 08/24/2021 2:30 PM Medical Record Number: [id number] Patient Account Number: [id number] Date of Birth/Sex: Treating RN: [birth year] (30 y.o. [name] Primary Care Meridith Romick: PCP, NO Other Clinician: Referring Gerritt Galentine: Treating Lillie Bollig/Extender: [name] in Treatment: 55 Visit Information History Since Last Visit Added or deleted any medications: No Patient Arrived: Wheel Chair Any new allergies or adverse reactions: No Arrival Time: 14:45 Had a fall or experienced change in No Transfer Assistance: Manual activities of daily living that may affect Patient Identification Verified: Yes risk of falls: Secondary Verification Process Completed: Yes Signs or symptoms of abuse/neglect since last visito No Patient Requires Transmission-Based Precautions: No Hospitalized since last visit: No Patient Has Alerts: No Implantable device outside of the clinic excluding No cellular tissue based products placed in the center since last visit: Has Dressing in Place as Prescribed: Yes Pain Present Now: No Electronic Signature(s) Signed: 08/24/2021 5:34:22 PM By: Antonieta Iba Entered By: Antonieta Iba on 08/24/2021 14:51:23 -------------------------------------------------------------------------------- Clinic Level of Care Assessment Details Patient Name: Date of Service: [name], [name] 08/24/2021 2:30 PM Medical Record Number: [id number] Patient Account Number: [id number] Date of Birth/Sex: Treating RN: May 21, 1991 (30 y.o. [name] Primary Care Gevin Perea: PCP, NO Other Clinician: Referring Callin Ashe: Treating Viktoriya Glaspy/Extender: [name] in Treatment: 55 Clinic Level of Care Assessment Items TOOL 4 Quantity Score []  - 0 Use when only an EandM is performed on FOLLOW-UP  visit ASSESSMENTS - Nursing Assessment / Reassessment X- 1 10 Reassessment of Co-morbidities (includes updates in patient status) X- 1 5 Reassessment of Adherence to Treatment Plan ASSESSMENTS - Wound and Skin A ssessment / Reassessment []  - 0 Simple Wound Assessment / Reassessment - one wound X- 4 5 Complex Wound Assessment / Reassessment - multiple wounds []  - 0 Dermatologic / Skin Assessment (not related to wound area) ASSESSMENTS - Focused Assessment []  - 0 Circumferential Edema Measurements - multi extremities []  - 0 Nutritional Assessment / Counseling / Intervention []  - 0 Lower Extremity Assessment (monofilament, tuning fork, pulses) []  - 0 Peripheral Arterial Disease Assessment (using hand held doppler) ASSESSMENTS - Ostomy and/or Continence Assessment and Care []  - 0 Incontinence Assessment and Management []  - 0 Ostomy Care Assessment and Management (repouching, etc.) PROCESS - Coordination of Care []  - 0 Simple Patient / Family Education for ongoing care X- 1 20 Complex (extensive) Patient / Family Education for ongoing care []  - 0 Staff obtains , Records, T Results / Process Orders est []  - 0 Staff telephones HHA, Nursing Homes / Clarify orders / etc []  - 0 Routine Transfer to another Facility (non-emergent condition) []  - 0 Routine Hospital Admission (non-emergent condition) []  - 0 New Admissions / / Ordering NPWT Apligraf, etc. , []  - 0 Emergency Hospital Admission (emergent condition) []  - 0 Simple Discharge Coordination []  - 0 Complex (extensive) Discharge Coordination PROCESS - Special Needs []  - 0 Pediatric / Minor Patient Management []  - 0 Isolation Patient Management []  - 0 Hearing / Language / Visual special needs []  - 0 Assessment of Community assistance (transportation, D/C planning, etc.) []  - 0 Additional assistance / Altered mentation []  - 0 Support Surface(s) Assessment (bed, cushion, seat,  etc.) INTERVENTIONS - Wound Cleansing / Measurement []  - 0 Simple Wound Cleansing - one wound X- 4 5 Complex Wound Cleansing - multiple wounds X- 1 5  Wound Imaging (photographs - any number of wounds) []  - 0 Wound Tracing (instead of photographs) []  - 0 Simple Wound Measurement - one wound X- 4 5 Complex Wound Measurement - multiple wounds INTERVENTIONS - Wound Dressings X - Small Wound Dressing one or multiple wounds 1 10 X- 3 15 Medium Wound Dressing one or multiple wounds []  - 0 Large Wound Dressing one or multiple wounds []  - 0 Application of Medications - topical []  - 0 Application of Medications - injection INTERVENTIONS - Miscellaneous []  - 0 External ear exam []  - 0 Specimen Collection (cultures, biopsies, blood, body fluids, etc.) []  - 0 Specimen(s) / Culture(s) sent or taken to Lab for analysis []  - 0 Patient Transfer (multiple staff / Lift / Similar devices) []  - 0 Simple Staple / Suture removal (25 or less) []  - 0 Complex Staple / Suture removal (26 or more) []  - 0 Hypo / Hyperglycemic Management (close monitor of Blood Glucose) []  - 0 Ankle / Brachial Index (ABI) - do not check if billed separately X- 1 5 Vital Signs Has the patient been seen at the hospital within the last three years: Yes Total Score: 160 Level Of Care: New/Established - Level 5 Electronic Signature(s) Signed: 08/24/2021 5:34:22 PM By: Entered By: on 08/24/2021 15:57:01 -------------------------------------------------------------------------------- Encounter Discharge Information Details Patient Name: Date of Service: L. 08/24/2021 2:30 PM Medical Record Number: Patient Account Number: Date of Birth/Sex: Treating RN: 1991/09/30 (30 y.o. Primary Care Zhavia Cunanan: PCP, NO Other Clinician: Referring Kallen Mccrystal: Treating Vola Beneke/Extender: in Treatment:  85 Encounter Discharge Information Items Discharge Condition: Stable Ambulatory Status: Wheelchair Discharge Destination: Home Transportation: Other Schedule Follow-up Appointment: Yes Clinical Summary of Care: Provided on 08/24/2021 Form Type Recipient Paper Patient Patient Electronic Signature(s) Signed: 08/24/2021 5:34:22 PM By: Antonieta Iba Entered By: Antonieta Iba on 08/24/2021 15:57:55 -------------------------------------------------------------------------------- Lower Extremity Assessment Details Patient Name: Date of Service: [name], [name] L. 08/24/2021 2:30 PM Medical Record Number: [id number] Patient Account Number: [id number] Date of Birth/Sex: Treating RN: 30-Aug-1991 (30 y.o. [name] Primary Care Kasheena Sambrano: PCP, NO Other Clinician: Referring Rosio Weiss: Treating Aurilla Coulibaly/Extender: [name] Weeks in Treatment: 55 Electronic Signature(s) Signed: 08/24/2021 5:34:22 PM By: 08/26/2021 Entered By: 08/26/2021 on 08/24/2021 14:54:44 -------------------------------------------------------------------------------- Multi-Disciplinary Care Plan Details Patient Name: Date of Service: [name], [name] 08/24/2021 2:30 PM Medical Record Number: [name] March Patient Account Number: 08/26/2021 Date of Birth/Sex: Treating RN: June 17, 1991 (30 y.o. [id number] Primary Care Lynell Kussman: PCP, NO Other Clinician: Referring Samie Barclift: Treating Donni Oglesby/Extender: 09/29/1991 in Treatment: 55 Multidisciplinary Care Plan reviewed with physician Active Inactive Pressure Nursing Diagnoses: Knowledge deficit related to causes and risk factors for pressure ulcer development Goals: Patient/caregiver will verbalize risk factors for pressure ulcer development Date Initiated: 07/30/2020 Target Resolution Date: 09/21/2021 Goal Status: Active Interventions: Provide education on pressure ulcers Notes: 06/08/21: Patient  recently had new pressure areas, target date extended. Wound/Skin Impairment Nursing Diagnoses: Impaired tissue integrity Goals: Patient/caregiver will verbalize understanding of skin care regimen Date Initiated: 01/05/2021 Target Resolution Date: 09/21/2021 Goal Status: Active Ulcer/skin breakdown will have a volume reduction of 50% by week 8 Date Initiated: 07/30/2020 Date Inactivated: 10/20/2020 Target Resolution Date: 09/27/2020 Goal Status: Unmet Unmet Reason: Infection Ulcer/skin breakdown will have a volume reduction of 80% by week 12 Date Initiated: 10/20/2020 Date Inactivated: 12/22/2020 Target Resolution Date: 11/19/2020 Unmet Reason:  paraplegic, difficulty Goal Status: Unmet offloading Interventions: Provide education on ulcer and skin care Notes: 06/08/21: wound care ongoing. Electronic Signature(s) Signed: 08/24/2021 5:34:22 PM By: Antonieta Iba Entered By: Antonieta Iba on 08/24/2021 15:11:37 -------------------------------------------------------------------------------- Pain Assessment Details Patient Name: Date of Service: [name], [name] 08/24/2021 2:30 PM Medical Record Number: [id number] Patient Account Number: [id number] Date of Birth/Sex: Treating RN: 25-Dec-1990 (30 y.o. [name] Primary Care Lea Walbert: PCP, NO Other Clinician: Referring Cai Flott: Treating Saramarie Stinger/Extender: [name] in Treatment: 55 Active Problems Location of Pain Severity and Description of Pain Patient Has Paino No Patient Has Paino No Site Locations Pain Management and Medication Current Pain Management: Electronic Signature(s) Signed: 08/24/2021 5:34:22 PM By: Antonieta Iba Entered By: Antonieta Iba on 08/24/2021 14:51:58 -------------------------------------------------------------------------------- Patient/Caregiver Education Details Patient Name: Date of Service: [name] 9/28/2022andnbsp2:30 PM Medical Record  Number: [id number] Patient Account Number: [id number] Date of Birth/Gender: Treating RN: 1991-02-15 (30 y.o. [name] Primary Care Physician: PCP, NO Other Clinician: Referring Physician: Treating Physician/Extender: [name] in Treatment: 84 Education Assessment Education Provided To: Patient Education Topics Provided Infection: Methods: Explain/Verbal Responses: State content correctly Pressure: Methods: Explain/Verbal, Printed Responses: State content correctly Wound/Skin Impairment: Methods: Explain/Verbal, Printed Responses: State content correctly Electronic Signature(s) Signed: 08/24/2021 5:34:22 PM By: Antonieta Iba Entered By: Antonieta Iba on 08/24/2021 15:12:00 -------------------------------------------------------------------------------- Wound Assessment Details Patient Name: Date of Service: [name], [name] L. 08/24/2021 2:30 PM Medical Record Number: [id number] Patient Account Number: [id number] Date of Birth/Sex: Treating RN: [birth year] (30 y.o. [name] Primary Care Karma Hiney: PCP, NO Other Clinician: Referring Shante Archambeault: Treating Lorah Kalina/Extender: Angus Palms Weeks in Treatment: 55 Wound Status Wound Number: 10 Primary Etiology: Pressure Ulcer Wound Location: Left, Lateral Ankle Wound Status: Open Wounding Event: Pressure Injury Comorbid History: Paraplegia Date Acquired: 08/13/2021 Weeks Of Treatment: 0 Clustered Wound: No Photos Wound Measurements Length: (cm) 2.1 Width: (cm) 2 Depth: (cm) 0.2 Area: (cm) 3.299 Volume: (cm) 0.66 % Reduction in Area: 0% % Reduction in Volume: 0% Epithelialization: None Tunneling: No Undermining: No Wound Description Classification: Unstageable/Unclassified Wound Margin: Distinct, outline attached Exudate Amount: Medium Exudate Type: Serosanguineous Exudate Color: red, brown Foul Odor After Cleansing: No Slough/Fibrino Yes Wound  Bed Granulation Amount: Small (1-33%) Exposed Structure Granulation Quality: Red Fascia Exposed: No Necrotic Amount: Large (67-100%) Fat Layer (Subcutaneous Tissue) Exposed: Yes Necrotic Quality: Eschar, Adherent Slough Tendon Exposed: No Muscle Exposed: No Joint Exposed: No Bone Exposed: No Treatment Notes Wound #10 (Ankle) Wound Laterality: Left, Lateral Cleanser Soap and Water Discharge Instruction: May shower and wash wound with dial antibacterial soap and water prior to dressing change. Peri-Wound Care Topical Primary Dressing Santyl Ointment Discharge Instruction: Apply nickel thick amount to wound bed as instructed Secondary Dressing Zetuvit Plus Silicone Border Dressing 4x4 (in/in) Discharge Instruction: Apply silicone border over primary dressing as directed. Secured With Compression Wrap Compression Stockings Facilities manager) Signed: 08/24/2021 5:34:22 PM By: Antonieta Iba Entered By: Antonieta Iba on 08/24/2021 15:09:30 -------------------------------------------------------------------------------- Wound Assessment Details Patient Name: Date of Service: [name], [name] 08/24/2021 2:30 PM Medical Record Number: [id number] Patient Account Number: [id number] Date of Birth/Sex: Treating RN: 1991-04-23 (30 y.o. [name] Primary Care Garey Alleva: PCP, NO Other Clinician: Referring Carah Barrientes: Treating Gauri Galvao/Extender: Angus Palms Weeks in Treatment: 55 Wound Status Wound Number: 6 Primary Etiology: Pressure Ulcer Wound Location: Right Ischium Wound Status: Open Wounding Event: Trauma Comorbid History: Paraplegia Date  Acquired: 02/10/2021 Weeks Of Treatment: 27 Clustered Wound: No Photos Wound Measurements Length: (cm) 5.7 Width: (cm) 4 Depth: (cm) 2 Area: (cm) 17.907 Volume: (cm) 35.814 % Reduction in Area: -16.9% % Reduction in Volume: -2237.7% Epithelialization: None Tunneling: No Undermining:  Yes Starting Position (o'clock): 11 Ending Position (o'clock): 2 Maximum Distance: (cm) 3 Wound Description Classification: Category/Stage IV Wound Margin: Well defined, not attached Exudate Amount: Medium Exudate Type: Serosanguineous Exudate Color: red, brown Wound Bed Granulation Amount: Large (67-100%) Granulation Quality: Red, Pink Necrotic Amount: Small (1-33%) Necrotic Quality: Adherent Slough Foul Odor After Cleansing: No Slough/Fibrino Yes Exposed Structure Fascia Exposed: No Fat Layer (Subcutaneous Tissue) Exposed: Yes Tendon Exposed: Yes Muscle Exposed: No Joint Exposed: No Bone Exposed: No Treatment Notes Wound #6 (Ischium) Wound Laterality: Right Cleanser Soap and Water Discharge Instruction: May shower and wash wound with dial antibacterial soap and water prior to dressing change. Wound Cleanser Discharge Instruction: Cleanse the wound with wound cleanser prior to applying a clean dressing using gauze sponges, not tissue or cotton balls. Peri-Wound Care Topical Primary Dressing Dakin's Solution 0.25%, 16 (oz) Discharge Instruction: Moisten gauze with Dakin's solution and pack lightly into wound Secondary Dressing Woven Gauze Sponge, Non-Sterile 4x4 in Discharge Instruction: Apply over primary dressing as directed. ABD Pad, 5x9 Discharge Instruction: Apply over primary dressing as directed. Secured With 47M Medipore H Soft Cloth Surgical T 4 x 2 (in/yd) ape Discharge Instruction: Secure dressing with tape as directed. Compression Wrap Compression Stockings Add-Ons Electronic Signature(s) Signed: 08/24/2021 5:34:22 PM By: Antonieta Iba Entered By: Antonieta Iba on 08/24/2021 15:01:08 -------------------------------------------------------------------------------- Wound Assessment Details Patient Name: Date of Service: [name], [name] 08/24/2021 2:30 PM Medical Record Number: [id number] Patient Account Number: [id number] Date of  Birth/Sex: Treating RN: 1991/09/05 (30 y.o. [name] Primary Care Deedra Pro: PCP, NO Other Clinician: Referring Joycelin Radloff: Treating Chivonne Rascon/Extender: Angus Palms Weeks in Treatment: 55 Wound Status Wound Number: 8 Primary Etiology: Pressure Ulcer Wound Location: Left Trochanter Wound Status: Open Wounding Event: Pressure Injury Comorbid History: Paraplegia Date Acquired: 05/09/2021 Weeks Of Treatment: 14 Clustered Wound: No Photos Wound Measurements Length: (cm) 4 Width: (cm) 4 Depth: (cm) 1.8 Area: (cm) 12.566 Volume: (cm) 22.619 % Reduction in Area: -3163.9% % Reduction in Volume: -59423.7% Epithelialization: None Tunneling: Yes Position (o'clock): 9 Maximum Distance: (cm) 3.6 Undermining: No Wound Description Classification: Category/Stage IV Wound Margin: Distinct, outline attached Exudate Amount: Medium Exudate Type: Purulent Exudate Color: yellow, brown, green Foul Odor After Cleansing: No Slough/Fibrino Yes Wound Bed Granulation Amount: Large (67-100%) Exposed Structure Granulation Quality: Red Fascia Exposed: No Necrotic Amount: Small (1-33%) Fat Layer (Subcutaneous Tissue) Exposed: Yes Necrotic Quality: Adherent Slough Tendon Exposed: No Muscle Exposed: Yes Necrosis of Muscle: Yes Joint Exposed: No Bone Exposed: No Treatment Notes Wound #8 (Trochanter) Wound Laterality: Left Cleanser Soap and Water Discharge Instruction: May shower and wash wound with dial antibacterial soap and water prior to dressing change. Wound Cleanser Discharge Instruction: Cleanse the wound with wound cleanser prior to applying a clean dressing using gauze sponges, not tissue or cotton balls. Peri-Wound Care Topical Primary Dressing Dakin's Solution 0.125%, 16 (oz) Discharge Instruction: Moisten gauze with Dakin's solution and pack lightly into wound Secondary Dressing Woven Gauze Sponge, Non-Sterile 4x4 in Discharge Instruction: Apply  over primary dressing moistened with saline Zetuvit Plus Silicone Border Dressing 4x4 (in/in) Discharge Instruction: Apply silicone border over primary dressing as directed. Secured With Compression Wrap Compression Stockings Facilities manager) Signed: 08/24/2021 5:34:22 PM By: Antonieta Iba Signed:  08/24/2021 5:34:22 PM By: Antonieta Iba Entered By: Antonieta Iba on 08/24/2021 15:30:48 -------------------------------------------------------------------------------- Wound Assessment Details Patient Name: Date of Service: [name], [name] 08/24/2021 2:30 PM Medical Record Number: [id number] Patient Account Number: [id number] Date of Birth/Sex: Treating RN: 03/24/1991 (30 y.o. [name] Primary Care Jaziya Obarr: PCP, NO Other Clinician: Referring Tamarah Bhullar: Treating Peachie Barkalow/Extender: Angus Palms Weeks in Treatment: 55 Wound Status Wound Number: 9 Primary Etiology: Pressure Ulcer Wound Location: Left Ischium Wound Status: Open Wounding Event: Pressure Injury Comorbid History: Paraplegia Date Acquired: 05/09/2021 Weeks Of Treatment: 14 Clustered Wound: No Photos Wound Measurements Length: (cm) 2.5 Width: (cm) 2.5 Depth: (cm) 0.2 Area: (cm) 4.909 Volume: (cm) 0.982 % Reduction in Area: -116.3% % Reduction in Volume: -332.6% Epithelialization: None Tunneling: No Undermining: No Wound Description Classification: Category/Stage III Wound Margin: Distinct, outline attached Exudate Amount: Medium Exudate Type: Serosanguineous Exudate Color: red, brown Foul Odor After Cleansing: No Slough/Fibrino No Wound Bed Granulation Amount: Medium (34-66%) Exposed Structure Granulation Quality: Red Fascia Exposed: No Necrotic Amount: Medium (34-66%) Fat Layer (Subcutaneous Tissue) Exposed: Yes Necrotic Quality: Adherent Slough Tendon Exposed: No Muscle Exposed: No Joint Exposed: No Bone Exposed: No Treatment Notes Wound #9  (Ischium) Wound Laterality: Left Cleanser Soap and Water Discharge Instruction: May shower and wash wound with dial antibacterial soap and water prior to dressing change. Wound Cleanser Discharge Instruction: Cleanse the wound with wound cleanser prior to applying a clean dressing using gauze sponges, not tissue or cotton balls. Peri-Wound Care Topical Primary Dressing Santyl Ointment Discharge Instruction: Apply nickel thick amount to wound bed as instructed Secondary Dressing Zetuvit Plus Silicone Border Dressing 4x4 (in/in) Discharge Instruction: Apply silicone border over primary dressing as directed. Secured With Compression Wrap Compression Stockings Facilities manager) Signed: 08/24/2021 5:34:22 PM By: Antonieta Iba Entered By: Antonieta Iba on 08/24/2021 15:05:37 -------------------------------------------------------------------------------- Vitals Details Patient Name: Date of Service: [name] March L. 08/24/2021 2:30 PM Medical Record Number: [id number] Patient Account Number: [id number] Date of Birth/Sex: Treating RN: July 02, 1991 (30 y.o. [name] Primary Care Emili Mcloughlin: PCP, NO Other Clinician: Referring Kaymon Denomme: Treating Loula Marcella/Extender: [name] in Treatment: 55 Vital Signs Time Taken: 14:51 Temperature (F): 98.6 Height (in): 71 Pulse (bpm): 79 Weight (lbs): 136 Respiratory Rate (breaths/min): 16 Body Mass Index (BMI): 19 Blood Pressure (mmHg): 127/70 Reference Range: 80 - 120 mg / dl Electronic Signature(s) Signed: 08/24/2021 5:34:22 PM By: Antonieta Iba Entered By: Antonieta Iba on 08/24/2021 14:51:51

## 2021-08-25 ENCOUNTER — Encounter: Payer: Self-pay | Admitting: Infectious Diseases

## 2021-08-25 ENCOUNTER — Telehealth: Payer: Self-pay

## 2021-08-25 ENCOUNTER — Ambulatory Visit (INDEPENDENT_AMBULATORY_CARE_PROVIDER_SITE_OTHER): Payer: Medicaid Other | Admitting: Infectious Diseases

## 2021-08-25 ENCOUNTER — Other Ambulatory Visit: Payer: Self-pay

## 2021-08-25 VITALS — BP 127/87 | HR 90 | Temp 98.0°F

## 2021-08-25 DIAGNOSIS — L8994 Pressure ulcer of unspecified site, stage 4: Secondary | ICD-10-CM

## 2021-08-25 DIAGNOSIS — L089 Local infection of the skin and subcutaneous tissue, unspecified: Secondary | ICD-10-CM

## 2021-08-25 DIAGNOSIS — Z792 Long term (current) use of antibiotics: Secondary | ICD-10-CM | POA: Diagnosis not present

## 2021-08-25 NOTE — Telephone Encounter (Signed)
Informed Anna at [facility] that patient's PICC line was pulled while in the office today. Tobi Bastos stated that she would inform pharmacist at [facility] of PICC line removal.  I verbalized my understanding.   Rhen Dossantos Lesli Albee, CMA

## 2021-08-25 NOTE — Assessment & Plan Note (Signed)
Polymicrobial wound with recently detected drug resistant pseudomonas on IV merrem. [name] has had resolution of fevers/chills/sweats and overall feels better since treating his infection.   Last labs on 9/19 show his ESR is down to 43 and CRP is 1.3 mg/dL, normal wbc. Still with some thin drainage from wound but no odor and significantly improved compared to 3 weeks ago. I think he is ready to stop antibiotics and continue with wound care; from my perspective I think he can be considered for a wound vac.   I explained that he may have relapses in or acquire new infections to the area until the skin has a chance to completely epithelialize over wound and will need to have him continue to work closely with wound care team to monitor the wound and make changes to wound care.  I am happy to see him back here in ID clinic if further assistance is needed with antibiotics.

## 2021-08-25 NOTE — Patient Instructions (Signed)
Your blood work is much improved and it sounds like the wound is doing well.  You are good to do a wound vac from my perspective next time you see the wound team. I am hopeful this will help to keep it clean and prevent infections for you also.   With the skin open still there is always a risk for new infections to settle in so ongoing wound care is going to be most important to keep it clean and monitor it. You are doing a great job!  If you have any concerns with infection call [facility] team so they can see you and do a culture if needed. We are happy to see you back here to help with treatment if needed also.

## 2021-08-25 NOTE — Progress Notes (Signed)
Per verbal order from Rexene Alberts, NP, 39 cm PICC removed from right brachial, tip intact. No sutures present. RN confirmed length per chart. Dressing was clean and dry. Insertion site cleaned with CHG. Petroleum dressing applied. Patient advised no heavy lifting with this arm and to leave dressing in place for 24 hours and not to shower affected arm for 24 hours. Advised patient to call office or seek emergency medical care if dressing becomes soaked with blood, swelling, or sharp pain presents. Advised patient to seek emergent care if develops neurological symptoms, chest pain, or shortness of breath. Instructed patient to notify office if they notice redness, warmth, or drainage at the site. Patient verbalized understanding and agreement. RN answered patient's questions. Patient tolerated procedure well and RN walked patient to check out. RN notified Tobi Bastos at [facility] of removal.    Sandie Ano, RN
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
## 2021-08-26 ENCOUNTER — Ambulatory Visit: Payer: Self-pay | Admitting: Nurse Practitioner

## 2021-08-26 ENCOUNTER — Other Ambulatory Visit: Payer: Self-pay

## 2021-08-26 LAB — BASIC METABOLIC PANEL
BUN: 16 mg/dL (ref 7–25)
CO2: 24 mmol/L (ref 20–32)
Calcium: 9.3 mg/dL (ref 8.6–10.3)
Chloride: 98 mmol/L (ref 98–110)
Creat: 0.6 mg/dL (ref 0.60–1.26)
Glucose, Bld: 76 mg/dL (ref 65–99)
Potassium: 4.3 mmol/L (ref 3.5–5.3)
Sodium: 134 mmol/L — ABNORMAL LOW (ref 135–146)

## 2021-08-26 LAB — CBC WITH DIFFERENTIAL/PLATELET
Absolute Monocytes: 853 cells/uL (ref 200–950)
Basophils Absolute: 17 cells/uL (ref 0–200)
Basophils Relative: 0.2 %
Eosinophils Absolute: 70 cells/uL (ref 15–500)
Eosinophils Relative: 0.8 %
HCT: 34.8 % — ABNORMAL LOW (ref 38.5–50.0)
Hemoglobin: 10.2 g/dL — ABNORMAL LOW (ref 13.2–17.1)
Lymphs Abs: 2306 cells/uL (ref 850–3900)
MCH: 20.4 pg — ABNORMAL LOW (ref 27.0–33.0)
MCHC: 29.3 g/dL — ABNORMAL LOW (ref 32.0–36.0)
MCV: 69.7 fL — ABNORMAL LOW (ref 80.0–100.0)
MPV: 9.2 fL (ref 7.5–12.5)
Monocytes Relative: 9.8 %
Neutro Abs: 5455 cells/uL (ref 1500–7800)
Neutrophils Relative %: 62.7 %
Platelets: 563 10*3/uL — ABNORMAL HIGH (ref 140–400)
RBC: 4.99 10*6/uL (ref 4.20–5.80)
RDW: 16.3 % — ABNORMAL HIGH (ref 11.0–15.0)
Total Lymphocyte: 26.5 %
WBC: 8.7 10*3/uL (ref 3.8–10.8)

## 2021-08-26 LAB — C-REACTIVE PROTEIN: CRP: 57.4 mg/L — ABNORMAL HIGH (ref <8.0)

## 2021-08-29 MED ORDER — OXYCODONE HCL 10 MG PO TABS: 10.0000 mg | ORAL_TABLET | Freq: Four times a day (QID) | ORAL | 0 refills | Status: DC | PRN

## 2021-08-31 ENCOUNTER — Encounter (HOSPITAL_BASED_OUTPATIENT_CLINIC_OR_DEPARTMENT_OTHER): Payer: Medicaid Other | Admitting: Physician Assistant

## 2021-09-02 ENCOUNTER — Ambulatory Visit: Payer: Self-pay | Admitting: Nurse Practitioner

## 2021-09-07 ENCOUNTER — Encounter (HOSPITAL_BASED_OUTPATIENT_CLINIC_OR_DEPARTMENT_OTHER): Payer: Medicaid Other | Attending: Physician Assistant | Admitting: Internal Medicine

## 2021-09-07 ENCOUNTER — Other Ambulatory Visit: Payer: Self-pay

## 2021-09-07 DIAGNOSIS — E43 Unspecified severe protein-calorie malnutrition: Secondary | ICD-10-CM | POA: Insufficient documentation

## 2021-09-07 DIAGNOSIS — M8668 Other chronic osteomyelitis, other site: Secondary | ICD-10-CM | POA: Insufficient documentation

## 2021-09-07 DIAGNOSIS — L89223 Pressure ulcer of left hip, stage 3: Secondary | ICD-10-CM | POA: Insufficient documentation

## 2021-09-07 DIAGNOSIS — L89314 Pressure ulcer of right buttock, stage 4: Secondary | ICD-10-CM | POA: Diagnosis not present

## 2021-09-07 DIAGNOSIS — G8221 Paraplegia, complete: Secondary | ICD-10-CM | POA: Diagnosis not present

## 2021-09-07 DIAGNOSIS — L89324 Pressure ulcer of left buttock, stage 4: Secondary | ICD-10-CM | POA: Diagnosis not present

## 2021-09-08 NOTE — Progress Notes (Signed)
TAVARION, STONEBACK ([id number]) Visit Report for 09/07/2021 Arrival Information Details Patient Name: Date of Service: [name], [name] 09/07/2021 2:00 PM Medical Record Number: [id number] Patient Account Number: [id number] Date of Birth/Sex: Treating RN: 1991/06/12 (30 y.o. Elizebeth Koller Primary Care Dodge Ator: PCP, NO Other Clinician: Referring Poet Hineman: Treating Sarahgrace Broman/Extender: Dorian Heckle in Treatment: 57 Visit Information History Since Last Visit Added or deleted any medications: No Patient Arrived: Wheel Chair Any new allergies or adverse reactions: No Arrival Time: 14:46 Had a fall or experienced change in No Accompanied By: alone activities of daily living that may affect Transfer Assistance: Manual risk of falls: Patient Identification Verified: Yes Signs or symptoms of abuse/neglect since last visito No Secondary Verification Process Completed: Yes Hospitalized since last visit: No Patient Requires Transmission-Based Precautions: No Implantable device outside of the clinic excluding No Patient Has Alerts: No cellular tissue based products placed in the center since last visit: Has Dressing in Place as Prescribed: Yes Pain Present Now: No Electronic Signature(s) Signed: 09/08/2021 4:58:13 PM By: [name] RN, BSN Entered By: [name] on 09/07/2021 15:15:15 -------------------------------------------------------------------------------- Encounter Discharge Information Details Patient Name: Date of Service: [name] March L. 09/07/2021 2:00 PM Medical Record Number: [id number] Patient Account Number: [id number] Date of Birth/Sex: Treating RN: Nov 21, 1991 (30 y.o. Elizebeth Koller Primary Care Cathlene Gardella: PCP, NO Other Clinician: Referring Shalayah Beagley: Treating Amirr Achord/Extender: Dorian Heckle in Treatment: 57 Encounter Discharge Information Items Post Procedure Vitals Discharge Condition:  Stable Temperature (F): 98.6 Ambulatory Status: Wheelchair Pulse (bpm): 82 Discharge Destination: Home Respiratory Rate (breaths/min): 18 Transportation: Private Auto Blood Pressure (mmHg): 124/78 Accompanied By: alone Schedule Follow-up Appointment: Yes Clinical Summary of Care: Patient Declined Electronic Signature(s) Signed: 09/08/2021 4:58:13 PM By: [name] RN, BSN Entered By: [name] on 09/07/2021 16:41:44 -------------------------------------------------------------------------------- Multi Wound Chart Details Patient Name: Date of Service: [name] March L. 09/07/2021 2:00 PM Medical Record Number: [id number] Patient Account Number: [id number] Date of Birth/Sex: Treating RN: [birth year] (30 y.o. Elizebeth Koller Primary Care Kassie Keng: PCP, NO Other Clinician: Referring Jazalyn Mondor: Treating Pearlina Friedly/Extender: Dorian Heckle in Treatment: 57 Vital Signs Height(in): 71 Pulse(bpm): 82 Weight(lbs): 136 Blood Pressure(mmHg): 124/78 Body Mass Index(BMI): 19 Temperature(F): 98.6 Respiratory Rate(breaths/min): 18 Photos: Left, Lateral Ankle Sacrum Right Ischium Wound Location: Pressure Injury Pressure Injury Trauma Wounding Event: Pressure Ulcer Pressure Ulcer Pressure Ulcer Primary Etiology: Paraplegia Paraplegia Paraplegia Comorbid History: 08/13/2021 09/07/2021 02/10/2021 Date Acquired: 2 0 29 Weeks of Treatment: Open Open Open Wound Status: 0.4x0.4x0.2 0.7x2x0.1 6.6x4x2.2 Measurements L x W x D (cm) 0.126 1.1 20.735 A (cm) : rea 0.025 0.11 45.616 Volume (cm) : 96.20% N/A -35.40% % Reduction in A rea: 96.20% N/A -2877.50% % Reduction in Volume: 10 Starting Position 1 (o'clock): 2 Ending Position 1 (o'clock): 3.4 Maximum Distance 1 (cm): No No Yes Undermining: Unstageable/Unclassified Category/Stage III Category/Stage IV Classification: Medium Medium Medium Exudate A mount: Serosanguineous Serosanguineous  Serosanguineous Exudate Type: red, brown red, brown red, brown Exudate Color: Distinct, outline attached Flat and Intact Well defined, not attached Wound Margin: Small (1-33%) Large (67-100%) Large (67-100%) Granulation A mount: Red, Pink Pink, Pale Red, Pink Granulation Quality: Large (67-100%) Small (1-33%) Small (1-33%) Necrotic A mount: Eschar, Adherent Slough Adherent [employer] Necrotic Tissue: Fat Layer (Subcutaneous Tissue): Yes Fat Layer (Subcutaneous Tissue): Yes Fat Layer (Subcutaneous Tissue): Yes Exposed Structures: Fascia: No Fascia: No Tendon: Yes Tendon: No Tendon: No Fascia: No Muscle: No Muscle: No Muscle: No Joint:  No Joint: No Joint: No Bone: No Bone: No Bone: No None None None Epithelialization: N/A N/A N/A Debridement: N/A N/A N/A Tissue Debrided: N/A N/A N/A Level: N/A N/A N/A Debridement A (sq cm): rea N/A N/A N/A Instrument: N/A N/A N/A Bleeding: N/A N/A N/A Hemostasis A chieved: N/A N/A N/A Procedural Pain: N/A N/A N/A Post Procedural Pain: Debridement Treatment Response: N/A N/A N/A Post Debridement Measurements L x N/A N/A N/A W x D (cm) N/A N/A N/A Post Debridement Volume: (cm) N/A N/A N/A Post Debridement Stage: N/A N/A N/A Procedures Performed: Wound Number: 8 9 N/A Photos: N/A Left Trochanter Left Ischium N/A Wound Location: Pressure Injury Pressure Injury N/A Wounding Event: Pressure Ulcer Pressure Ulcer N/A Primary Etiology: Paraplegia Paraplegia N/A Comorbid History: 05/09/2021 05/09/2021 N/A Date Acquired: 16 16 N/A Weeks of Treatment: Open Open N/A Wound Status: 3x4x1.1 2.7x3.1x0.3 N/A Measurements L x W x D (cm) 9.425 6.574 N/A A (cm) : rea 10.367 1.972 N/A Volume (cm) : -2348.10% -189.60% N/A % Reduction in A rea: -27181.60% -768.70% N/A % Reduction in Volume: No No N/A Undermining: Category/Stage IV Category/Stage III N/A Classification: Medium Medium N/A Exudate A  mount: Serosanguineous Serosanguineous N/A Exudate Type: red, brown red, brown N/A Exudate Color: Distinct, outline attached Distinct, outline attached N/A Wound Margin: Large (67-100%) None Present (0%) N/A Granulation A mount: Red N/A N/A Granulation Quality: Small (1-33%) Large (67-100%) N/A Necrotic A mount: Adherent Slough Adherent Slough N/A Necrotic Tissue: Fat Layer (Subcutaneous Tissue): Yes Fat Layer (Subcutaneous Tissue): Yes N/A Exposed Structures: Muscle: Yes Fascia: No Fascia: No Tendon: No Tendon: No Muscle: No Joint: No Joint: No Bone: No Bone: No None None N/A Epithelialization: N/A Debridement - Excisional N/A Debridement: Pre-procedure Verification/Time Out N/A 15:31 N/A Taken: N/A Subcutaneous, Slough N/A Tissue Debrided: N/A Skin/Subcutaneous Tissue N/A Level: N/A 8.37 N/A Debridement A (sq cm): rea N/A Curette N/A Instrument: N/A Minimum N/A Bleeding: N/A Pressure N/A Hemostasis A chieved: N/A 0 N/A Procedural Pain: N/A 0 N/A Post Procedural Pain: N/A Procedure was tolerated well N/A Debridement Treatment Response: N/A 2.7x3.1x0.3 N/A Post Debridement Measurements L x W x D (cm) N/A 1.972 N/A Post Debridement Volume: (cm) N/A Category/Stage III N/A Post Debridement Stage: N/A Debridement N/A Procedures Performed: Treatment Notes Electronic Signature(s) Signed: 09/07/2021 4:37:06 PM By: Baltazar Najjar MD Signed: 09/08/2021 4:58:13 PM By: [name] RN, BSN Entered By: Baltazar Najjar on 09/07/2021 15:41:55 -------------------------------------------------------------------------------- Multi-Disciplinary Care Plan Details Patient Name: Date of Service: [name], [name] 09/07/2021 2:00 PM Medical Record Number: [id number] Patient Account Number: [id number] Date of Birth/Sex: Treating RN: 1991/07/13 (30 y.o. Elizebeth Koller Primary Care Atalaya Zappia: PCP, NO Other Clinician: Referring Aevah Stansbery: Treating  Rubye Strohmeyer/Extender: Dorian Heckle in Treatment: 57 Multidisciplinary Care Plan reviewed with physician Active Inactive Pressure Nursing Diagnoses: Knowledge deficit related to causes and risk factors for pressure ulcer development Goals: Patient/caregiver will verbalize risk factors for pressure ulcer development Date Initiated: 07/30/2020 Target Resolution Date: 09/21/2021 Goal Status: Active Interventions: Provide education on pressure ulcers Notes: 06/08/21: Patient recently had new pressure areas, target date extended. Wound/Skin Impairment Nursing Diagnoses: Impaired tissue integrity Goals: Patient/caregiver will verbalize understanding of skin care regimen Date Initiated: 01/05/2021 Target Resolution Date: 09/21/2021 Goal Status: Active Ulcer/skin breakdown will have a volume reduction of 50% by week 8 Date Initiated: 07/30/2020 Date Inactivated: 10/20/2020 Target Resolution Date: 09/27/2020 Goal Status: Unmet Unmet Reason: Infection Ulcer/skin breakdown will have a volume reduction of 80% by week 12 Date Initiated: 10/20/2020 Date  Inactivated: 12/22/2020 Target Resolution Date: 11/19/2020 Unmet Reason: paraplegic, difficulty Goal Status: Unmet offloading Interventions: Provide education on ulcer and skin care Notes: 06/08/21: wound care ongoing. Electronic Signature(s) Signed: 09/08/2021 4:58:13 PM By: [name] RN, BSN Entered By: [name] on 09/07/2021 16:38:20 -------------------------------------------------------------------------------- Pain Assessment Details Patient Name: Date of Service: [name] March L. 09/07/2021 2:00 PM Medical Record Number: [id number] Patient Account Number: [id number] Date of Birth/Sex: Treating RN: [birth year] (30 y.o. Elizebeth Koller Primary Care Tramar Brueckner: PCP, NO Other Clinician: Referring Marveline Profeta: Treating Mikeila Burgen/Extender: Dorian Heckle in Treatment: 57 Active  Problems Location of Pain Severity and Description of Pain Patient Has Paino No Site Locations Pain Management and Medication Current Pain Management: Electronic Signature(s) Signed: 09/08/2021 4:58:13 PM By: [name] RN, BSN Entered By: [name] on 09/07/2021 15:15:54 -------------------------------------------------------------------------------- Patient/Caregiver Education Details Patient Name: Date of Service: [name] 10/12/2022andnbsp2:00 PM Medical Record Number: [id number] Patient Account Number: [id number] Date of Birth/Gender: Treating RN: Sep 06, 1991 (30 y.o. Elizebeth Koller Primary Care Physician: PCP, NO Other Clinician: Referring Physician: Treating Physician/Extender: Dorian Heckle in Treatment: 56 Education Assessment Education Provided To: Patient Education Topics Provided Wound/Skin Impairment: Methods: Explain/Verbal Responses: State content correctly [employer]) Signed: 09/08/2021 4:58:13 PM By: [name] RN, BSN Entered By: [name] on 09/07/2021 16:38:31 -------------------------------------------------------------------------------- Wound Assessment Details Patient Name: Date of Service: [name] March L. 09/07/2021 2:00 PM Medical Record Number: [id number] Patient Account Number: [id number] Date of Birth/Sex: Treating RN: Mar 31, 1991 (30 y.o. Elizebeth Koller Primary Care Saadia Dewitt: PCP, NO Other Clinician: Referring Thessaly Mccullers: Treating Soni Kegel/Extender: Dorian Heckle in Treatment: 57 Wound Status Wound Number: 10 Primary Etiology: Pressure Ulcer Wound Location: Left, Lateral Ankle Wound Status: Open Wounding Event: Pressure Injury Comorbid History: Paraplegia Date Acquired: 08/13/2021 Weeks Of Treatment: 2 Clustered Wound: No Photos Wound Measurements Length: (cm) 0.4 Width: (cm) 0.4 Depth: (cm) 0.2 Area: (cm) 0.126 Volume: (cm)  0.025 % Reduction in Area: 96.2% % Reduction in Volume: 96.2% Epithelialization: None Tunneling: No Undermining: No Wound Description Classification: Unstageable/Unclassified Wound Margin: Distinct, outline attached Exudate Amount: Medium Exudate Type: Serosanguineous Exudate Color: red, brown Foul Odor After Cleansing: No Slough/Fibrino Yes Wound Bed Granulation Amount: Small (1-33%) Exposed Structure Granulation Quality: Red, Pink Fascia Exposed: No Necrotic Amount: Large (67-100%) Fat Layer (Subcutaneous Tissue) Exposed: Yes Necrotic Quality: Eschar, Adherent Slough Tendon Exposed: No Muscle Exposed: No Joint Exposed: No Bone Exposed: No Treatment Notes Wound #10 (Ankle) Wound Laterality: Left, Lateral Cleanser Soap and Water Discharge Instruction: May shower and wash wound with dial antibacterial soap and water prior to dressing change. Peri-Wound Care Topical Primary Dressing Santyl Ointment Discharge Instruction: Apply nickel thick amount to wound bed as instructed Secondary Dressing Zetuvit Plus Silicone Border Dressing 4x4 (in/in) Discharge Instruction: Apply silicone border over primary dressing as directed. Secured With Compression Wrap Compression Stockings Facilities manager) Signed: 09/08/2021 4:58:13 PM By: [name] RN, BSN Entered By: [name] on 09/07/2021 15:07:27 -------------------------------------------------------------------------------- Wound Assessment Details Patient Name: Date of Service: [name] March L. 09/07/2021 2:00 PM Medical Record Number: [id number] Patient Account Number: [id number] Date of Birth/Sex: Treating RN: 11/17/1991 (30 y.o. Elizebeth Koller Primary Care Sheriece Jefcoat: PCP, NO Other Clinician: Referring Waneta Fitting: Treating Archimedes Harold/Extender: Dorian Heckle in Treatment: 57 Wound Status Wound Number: 11 Primary Etiology: Pressure Ulcer Wound Location: Sacrum Wound  Status: Open Wounding Event: Pressure Injury Comorbid History: Paraplegia Date Acquired: 09/07/2021 Weeks Of Treatment: 0  Clustered Wound: No Photos Wound Measurements Length: (cm) 0.7 Width: (cm) 2 Depth: (cm) 0.1 Area: (cm) 1.1 Volume: (cm) 0.11 % Reduction in Area: % Reduction in Volume: Epithelialization: None Tunneling: No Undermining: No Wound Description Classification: Category/Stage III Wound Margin: Flat and Intact Exudate Amount: Medium Exudate Type: Serosanguineous Exudate Color: red, brown Foul Odor After Cleansing: No Slough/Fibrino Yes Wound Bed Granulation Amount: Large (67-100%) Exposed Structure Granulation Quality: Pink, Pale Fascia Exposed: No Necrotic Amount: Small (1-33%) Fat Layer (Subcutaneous Tissue) Exposed: Yes Necrotic Quality: Adherent Slough Tendon Exposed: No Muscle Exposed: No Joint Exposed: No Bone Exposed: No Treatment Notes Wound #11 (Sacrum) Cleanser Soap and Water Discharge Instruction: May shower and wash wound with dial antibacterial soap and water prior to dressing change. Wound Cleanser Discharge Instruction: Cleanse the wound with wound cleanser prior to applying a clean dressing using gauze sponges, not tissue or cotton balls. Peri-Wound Care Topical Primary Dressing Dakin's Solution 0.25%, 16 (oz) Discharge Instruction: Moisten gauze with Dakin's solution and pack lightly into wound Secondary Dressing Woven Gauze Sponge, Non-Sterile 4x4 in Discharge Instruction: Apply over primary dressing as directed. ABD Pad, 5x9 Discharge Instruction: Apply over primary dressing as directed. Secured With 22M Medipore H Soft Cloth Surgical T 4 x 2 (in/yd) ape Discharge Instruction: Secure dressing with tape as directed. Compression Wrap Compression Stockings Add-Ons Electronic Signature(s) Signed: 09/08/2021 4:58:13 PM By: [name] RN, BSN Entered By: [name] on 09/07/2021  15:06:35 -------------------------------------------------------------------------------- Wound Assessment Details Patient Name: Date of Service: [name] March L. 09/07/2021 2:00 PM Medical Record Number: [id number] Patient Account Number: [id number] Date of Birth/Sex: Treating RN: [birth year] (30 y.o. Elizebeth Koller Primary Care Kelissa Merlin: PCP, NO Other Clinician: Referring Ladona Rosten: Treating Zharia Conrow/Extender: Dorian Heckle in Treatment: 57 Wound Status Wound Number: 6 Primary Etiology: Pressure Ulcer Wound Location: Right Ischium Wound Status: Open Wounding Event: Trauma Comorbid History: Paraplegia Date Acquired: 02/10/2021 Weeks Of Treatment: 29 Clustered Wound: No Photos Wound Measurements Length: (cm) 6.6 Width: (cm) 4 Depth: (cm) 2.2 Area: (cm) 20.735 Volume: (cm) 45.616 % Reduction in Area: -35.4% % Reduction in Volume: -2877.5% Epithelialization: None Tunneling: No Undermining: Yes Starting Position (o'clock): 10 Ending Position (o'clock): 2 Maximum Distance: (cm) 3.4 Wound Description Classification: Category/Stage IV Wound Margin: Well defined, not attached Exudate Amount: Medium Exudate Type: Serosanguineous Exudate Color: red, brown Foul Odor After Cleansing: No Slough/Fibrino Yes Wound Bed Granulation Amount: Large (67-100%) Exposed Structure Granulation Quality: Red, Pink Fascia Exposed: No Necrotic Amount: Small (1-33%) Fat Layer (Subcutaneous Tissue) Exposed: Yes Necrotic Quality: Adherent Slough Tendon Exposed: Yes Muscle Exposed: No Joint Exposed: No Bone Exposed: No Treatment Notes Wound #6 (Ischium) Wound Laterality: Right Cleanser Soap and Water Discharge Instruction: May shower and wash wound with dial antibacterial soap and water prior to dressing change. Wound Cleanser Discharge Instruction: Cleanse the wound with wound cleanser prior to applying a clean dressing using gauze sponges, not tissue or  cotton balls. Peri-Wound Care Topical Primary Dressing Dakin's Solution 0.25%, 16 (oz) Discharge Instruction: Moisten gauze with Dakin's solution and pack lightly into wound Secondary Dressing Woven Gauze Sponge, Non-Sterile 4x4 in Discharge Instruction: Apply over primary dressing as directed. ABD Pad, 5x9 Discharge Instruction: Apply over primary dressing as directed. Secured With 22M Medipore H Soft Cloth Surgical T 4 x 2 (in/yd) ape Discharge Instruction: Secure dressing with tape as directed. Compression Wrap Compression Stockings Add-Ons Electronic Signature(s) Signed: 09/08/2021 4:58:13 PM By: [name] RN, BSN Entered By: [name] on 09/07/2021 15:08:20 -------------------------------------------------------------------------------- Wound  Assessment Details Patient Name: Date of Service: [name], [name] 09/07/2021 2:00 PM Medical Record Number: [id number] Patient Account Number: [id number] Date of Birth/Sex: Treating RN: January 23, 1991 (30 y.o. Elizebeth Koller Primary Care Nevaen Tredway: PCP, NO Other Clinician: Referring Gerlad Pelzel: Treating Damari Suastegui/Extender: Dorian Heckle in Treatment: 57 Wound Status Wound Number: 8 Primary Etiology: Pressure Ulcer Wound Location: Left Trochanter Wound Status: Open Wounding Event: Pressure Injury Comorbid History: Paraplegia Date Acquired: 05/09/2021 Weeks Of Treatment: 16 Clustered Wound: No Photos Wound Measurements Length: (cm) 3 Width: (cm) 4 Depth: (cm) 1.1 Area: (cm) 9.425 Volume: (cm) 10.367 % Reduction in Area: -2348.1% % Reduction in Volume: -27181.6% Epithelialization: None Tunneling: Yes Position (o'clock): 9 Maximum Distance: (cm) 4 Undermining: No Wound Description Classification: Category/Stage IV Wound Margin: Distinct, outline attached Exudate Amount: Medium Exudate Type: Serosanguineous Exudate Color: red, brown Foul Odor After Cleansing: No Slough/Fibrino  Yes Wound Bed Granulation Amount: Large (67-100%) Exposed Structure Granulation Quality: Red Fascia Exposed: No Necrotic Amount: Small (1-33%) Fat Layer (Subcutaneous Tissue) Exposed: Yes Necrotic Quality: Adherent Slough Tendon Exposed: No Muscle Exposed: Yes Necrosis of Muscle: No Joint Exposed: No Bone Exposed: No Treatment Notes Wound #8 (Trochanter) Wound Laterality: Left Cleanser Soap and Water Discharge Instruction: May shower and wash wound with dial antibacterial soap and water prior to dressing change. Wound Cleanser Discharge Instruction: Cleanse the wound with wound cleanser prior to applying a clean dressing using gauze sponges, not tissue or cotton balls. Peri-Wound Care Topical Primary Dressing Dakin's Solution 0.125%, 16 (oz) Discharge Instruction: Moisten gauze with Dakin's solution and pack lightly into wound Secondary Dressing Woven Gauze Sponge, Non-Sterile 4x4 in Discharge Instruction: Apply over primary dressing moistened with saline Zetuvit Plus Silicone Border Dressing 4x4 (in/in) Discharge Instruction: Apply silicone border over primary dressing as directed. Secured With Compression Wrap Compression Stockings Facilities manager) Signed: 09/08/2021 4:58:13 PM By: [name] RN, BSN Entered By: [name] on 09/07/2021 16:07:47 -------------------------------------------------------------------------------- Wound Assessment Details Patient Name: Date of Service: [name] March L. 09/07/2021 2:00 PM Medical Record Number: [id number] Patient Account Number: [id number] Date of Birth/Sex: Treating RN: 09-12-1991 (30 y.o. Elizebeth Koller Primary Care Ethell Blatchford: PCP, NO Other Clinician: Referring Fiza Nation: Treating Michiah Masse/Extender: Dorian Heckle in Treatment: 57 Wound Status Wound Number: 9 Primary Etiology: Pressure Ulcer Wound Location: Left Ischium Wound Status: Open Wounding Event: Pressure  Injury Comorbid History: Paraplegia Date Acquired: 05/09/2021 Weeks Of Treatment: 16 Clustered Wound: No Photos Wound Measurements Length: (cm) 2.7 Width: (cm) 3.1 Depth: (cm) 0.3 Area: (cm) 6.574 Volume: (cm) 1.972 % Reduction in Area: -189.6% % Reduction in Volume: -768.7% Epithelialization: None Tunneling: No Undermining: No Wound Description Classification: Category/Stage III Wound Margin: Distinct, outline attached Exudate Amount: Medium Exudate Type: Serosanguineous Exudate Color: red, brown Foul Odor After Cleansing: No Slough/Fibrino No Wound Bed Granulation Amount: None Present (0%) Exposed Structure Necrotic Amount: Large (67-100%) Fascia Exposed: No Necrotic Quality: Adherent Slough Fat Layer (Subcutaneous Tissue) Exposed: Yes Tendon Exposed: No Muscle Exposed: No Joint Exposed: No Bone Exposed: No Treatment Notes Wound #9 (Ischium) Wound Laterality: Left Cleanser Soap and Water Discharge Instruction: May shower and wash wound with dial antibacterial soap and water prior to dressing change. Wound Cleanser Discharge Instruction: Cleanse the wound with wound cleanser prior to applying a clean dressing using gauze sponges, not tissue or cotton balls. Peri-Wound Care Topical Primary Dressing Santyl Ointment Discharge Instruction: Apply nickel thick amount to wound bed as instructed Secondary Dressing Zetuvit Plus Silicone Border Dressing 4x4 (  in/in) Discharge Instruction: Apply silicone border over primary dressing as directed. Secured With Compression Wrap Compression Stockings Facilities manager) Signed: 09/08/2021 4:58:13 PM By: [name] RN, BSN Entered By: [name] on 09/07/2021 15:09:59 -------------------------------------------------------------------------------- Vitals Details Patient Name: Date of Service: [name] March L. 09/07/2021 2:00 PM Medical Record Number: [id number] Patient Account Number:  [id number] Date of Birth/Sex: Treating RN: 24-May-1991 (30 y.o. Elizebeth Koller Primary Care Rudell Ortman: PCP, NO Other Clinician: Referring Jeremias Broyhill: Treating Roth Ress/Extender: Dorian Heckle in Treatment: 57 Vital Signs Time Taken: 14:46 Temperature (F): 98.6 Height (in): 71 Pulse (bpm): 82 Weight (lbs): 136 Respiratory Rate (breaths/min): 18 Body Mass Index (BMI): 19 Blood Pressure (mmHg): 124/78 Reference Range: 80 - 120 mg / dl Electronic Signature(s) Signed: 09/08/2021 4:58:13 PM By: [name] RN, BSN Entered By: [name] on 09/07/2021 15:15:49

## 2021-09-08 NOTE — Progress Notes (Signed)
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 A time out was conducted at 15:31, prior to the start of the procedure. A Minimum amount of bleeding was controlled with Pressure. The procedure was tolerated well with a pain level of 0 throughout and a pain level of 0 following the procedure. Post Debridement Measurements: 2.7cm length x 3.1cm width x 0.3cm depth;  1.972cm^3 volume. Post debridement Stage noted as Category/Stage III. Character of Wound/Ulcer Post Debridement requires further debridement. Post procedure Diagnosis Wound #9: Same as Pre-Procedure Plan Follow-up Appointments: Return Appointment in 1 week. - with Luana Shu Shower/ Hygiene: May shower with protection but do not get wound dressing(s) wet. Off-Loading: Turn and reposition every 2 hours - be sure to lift up off the chair with arms every hour while in wheelchair Other: - pillows under left calf to keep pressure of left lateral ankle Non Wound Condition: Protect area with: - Protect sacrum with vaseline or zinc oxide Laboratory ordered were: Anerobic culture - Left trochanter WOUND #10: - Ankle Wound Laterality: Left, Lateral Cleanser: Soap and Water 1 x Per Day/15 Days Discharge Instructions: May shower and wash wound with dial antibacterial soap and water prior to dressing change. Prim Dressing: Santyl Ointment 1 x Per Day/15 Days ary Discharge Instructions: Apply nickel thick amount to wound bed as instructed Secondary Dressing: Zetuvit Plus Silicone Border Dressing 4x4 (in/in) 1 x Per Day/15 Days Discharge Instructions: Apply silicone border over primary dressing as directed. WOUND #11: -  Sacrum Wound Laterality: Cleanser: Soap and Water 1 x Per Day/30 Days Discharge Instructions: May shower and wash wound with dial antibacterial soap and water prior to dressing change. Cleanser: Wound Cleanser 1 x Per Day/30 Days Discharge Instructions: Cleanse the wound with wound cleanser prior to applying a clean dressing using gauze sponges, not tissue or cotton balls. Prim Dressing: Dakin's Solution 0.25%, 16 (oz) 1 x Per Day/30 Days ary Discharge Instructions: Moisten gauze with Dakin's solution and pack lightly into wound Secondary Dressing: Woven Gauze Sponge, Non-Sterile 4x4 in 1 x Per Day/30 Days Discharge Instructions: Apply over primary dressing as  directed. Secondary Dressing: ABD Pad, 5x9 1 x Per Day/30 Days Discharge Instructions: Apply over primary dressing as directed. Secured With: 57M Medipore H Soft Cloth Surgical T 4 x 2 (in/yd) 1 x Per Day/30 Days ape Discharge Instructions: Secure dressing with tape as directed. WOUND #6: - Ischium Wound Laterality: Right Cleanser: Soap and Water 1 x Per Day/30 Days Discharge Instructions: May shower and wash wound with dial antibacterial soap and water prior to dressing change. Cleanser: Wound Cleanser 1 x Per Day/30 Days Discharge Instructions: Cleanse the wound with wound cleanser prior to applying a clean dressing using gauze sponges, not tissue or cotton balls. Prim Dressing: Dakin's Solution 0.25%, 16 (oz) 1 x Per Day/30 Days ary Discharge Instructions: Moisten gauze with Dakin's solution and pack lightly into wound Secondary Dressing: Woven Gauze Sponge, Non-Sterile 4x4 in 1 x Per Day/30 Days Discharge Instructions: Apply over primary dressing as directed. Secondary Dressing: ABD Pad, 5x9 1 x Per Day/30 Days Discharge Instructions: Apply over primary dressing as directed. Secured With: 57M Medipore H Soft Cloth Surgical T 4 x 2 (in/yd) 1 x Per Day/30 Days ape Discharge Instructions: Secure dressing with tape as directed. WOUND #8: - Trochanter Wound Laterality: Left Cleanser: Soap and Water 1 x Per Day/30 Days Discharge Instructions: May shower and wash wound with dial antibacterial soap and water prior to dressing change. Cleanser: Wound Cleanser 1 x Per Day/30 Days Discharge Instructions: Cleanse the wound with wound cleanser prior to applying a clean dressing using gauze sponges, not tissue or cotton balls. Prim Dressing: Dakin's Solution 0.125%, 16 (oz) 1 x Per Day/30 Days ary Discharge Instructions: Moisten gauze with Dakin's solution and pack lightly into wound Secondary Dressing: Woven Gauze Sponge, Non-Sterile 4x4 in 1 x Per Day/30 Days Discharge Instructions: Apply over  primary dressing moistened with saline Secondary Dressing: Zetuvit Plus Silicone Border Dressing 4x4 (in/in) 1 x Per Day/30 Days Discharge Instructions: Apply silicone border over primary dressing as directed. WOUND #9: - Ischium Wound Laterality: Left Cleanser: Soap and Water 1 x Per Day/30 Days Discharge Instructions: May shower and wash wound with dial antibacterial soap and water prior to dressing change. Cleanser: Wound Cleanser 1 x Per Day/30 Days Discharge Instructions: Cleanse the wound with wound cleanser prior to applying a clean dressing using gauze sponges, not tissue or cotton balls. Prim Dressing: Santyl Ointment 1 x Per Day/30 Days ary Discharge Instructions: Apply nickel thick amount to wound bed as instructed Secondary Dressing: Zetuvit Plus Silicone Border Dressing 4x4 (in/in) 1 x Per Day/30 Days Discharge Instructions: Apply silicone border over primary dressing as directed. 1. I did not change the wet-to-dry dressings today 2. The area on the left posterior greater trochanter was cultured because of the tunneling area with serous drainage. 3. He is using Santyl to the left buttock wound this was debrided the Santyl will continue 4. Wet-to-dry to the left hip and  now the recurrent sacral wound seems appropriate 5. Left lateral malleolus also looks as though it is going towards closure 6. With regards to the wound VAC the area on the left posterior greater trochanter and possibly the right buttock might benefit from a wound VAC. I am not sure you could easily bridge this without him sitting on the tube. Might want to start with the left hip if the culture is negative and then moved to the right buttock. 7. Culture done of the left buttock wound. No empiric antibiotics Electronic Signature(s) Signed: 09/07/2021 4:37:06 PM By: Baltazar Najjar MD Entered By: Baltazar Najjar on 09/07/2021  15:45:36 -------------------------------------------------------------------------------- SuperBill Details Patient Name: Date of Service: [name] 09/07/2021 Medical Record Number: [id number] Patient Account Number: [id number] Date of Birth/Sex: Treating RN: 15-Aug-1991 (30 y.o. 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

## 2021-09-09 ENCOUNTER — Encounter: Payer: Self-pay | Admitting: Nurse Practitioner

## 2021-09-09 ENCOUNTER — Other Ambulatory Visit: Payer: Self-pay

## 2021-09-09 ENCOUNTER — Ambulatory Visit (INDEPENDENT_AMBULATORY_CARE_PROVIDER_SITE_OTHER): Payer: Medicaid Other | Admitting: Nurse Practitioner

## 2021-09-09 VITALS — BP 133/84 | HR 85 | Temp 98.0°F

## 2021-09-09 DIAGNOSIS — Z0000 Encounter for general adult medical examination without abnormal findings: Secondary | ICD-10-CM

## 2021-09-09 DIAGNOSIS — D508 Other iron deficiency anemias: Secondary | ICD-10-CM

## 2021-09-09 DIAGNOSIS — S24109D Unspecified injury at unspecified level of thoracic spinal cord, subsequent encounter: Secondary | ICD-10-CM | POA: Diagnosis not present

## 2021-09-09 DIAGNOSIS — K5903 Drug induced constipation: Secondary | ICD-10-CM | POA: Diagnosis not present

## 2021-09-09 MED ORDER — FERROUS SULFATE 325 (65 FE) MG PO TABS: 325.0000 mg | ORAL_TABLET | Freq: Every day | ORAL | 0 refills | Status: DC

## 2021-09-09 MED ORDER — DOCUSATE SODIUM 100 MG PO CAPS: 100.0000 mg | ORAL_CAPSULE | Freq: Two times a day (BID) | ORAL | 0 refills | Status: AC | PRN
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
## 2021-09-09 NOTE — Patient Instructions (Signed)
You were seen today in the [facility] for follow up of chronic illness. Labs were collected, results will be available via MyChart or, if abnormal, you will be contacted by clinic staff. You were prescribed medications, please take as directed. Please follow up in 3 mths for reevaluation of anemia

## 2021-09-10 LAB — CBC WITH DIFFERENTIAL/PLATELET
Basophils Absolute: 0 10*3/uL (ref 0.0–0.2)
Basos: 1 %
EOS (ABSOLUTE): 0.1 10*3/uL (ref 0.0–0.4)
Eos: 1 %
Hematocrit: 37.6 % (ref 37.5–51.0)
Hemoglobin: 11.2 g/dL — ABNORMAL LOW (ref 13.0–17.7)
Immature Grans (Abs): 0 10*3/uL (ref 0.0–0.1)
Immature Granulocytes: 0 %
Lymphocytes Absolute: 2 10*3/uL (ref 0.7–3.1)
Lymphs: 31 %
MCH: 20.6 pg — ABNORMAL LOW (ref 26.6–33.0)
MCHC: 29.8 g/dL — ABNORMAL LOW (ref 31.5–35.7)
MCV: 69 fL — ABNORMAL LOW (ref 79–97)
Monocytes Absolute: 0.5 10*3/uL (ref 0.1–0.9)
Monocytes: 7 %
Neutrophils Absolute: 4 10*3/uL (ref 1.4–7.0)
Neutrophils: 60 %
Platelets: 595 10*3/uL — ABNORMAL HIGH (ref 150–450)
RBC: 5.44 x10E6/uL (ref 4.14–5.80)
RDW: 17.2 % — ABNORMAL HIGH (ref 11.6–15.4)
WBC: 6.6 10*3/uL (ref 3.4–10.8)

## 2021-09-10 LAB — COMPREHENSIVE METABOLIC PANEL
ALT: 7 IU/L (ref 0–44)
AST: 8 IU/L (ref 0–40)
Albumin/Globulin Ratio: 1.1 — ABNORMAL LOW (ref 1.2–2.2)
Albumin: 4.2 g/dL (ref 4.0–5.0)
Alkaline Phosphatase: 93 IU/L (ref 44–121)
BUN/Creatinine Ratio: 18 (ref 9–20)
BUN: 13 mg/dL (ref 6–20)
Bilirubin Total: 0.2 mg/dL (ref 0.0–1.2)
CO2: 24 mmol/L (ref 20–29)
Calcium: 9.7 mg/dL (ref 8.7–10.2)
Chloride: 96 mmol/L (ref 96–106)
Creatinine, Ser: 0.72 mg/dL — ABNORMAL LOW (ref 0.76–1.27)
Globulin, Total: 3.9 g/dL (ref 1.5–4.5)
Glucose: 115 mg/dL — ABNORMAL HIGH (ref 70–99)
Potassium: 4.6 mmol/L (ref 3.5–5.2)
Sodium: 136 mmol/L (ref 134–144)
Total Protein: 8.1 g/dL (ref 6.0–8.5)
eGFR: 125 mL/min/{1.73_m2} (ref >59)

## 2021-09-10 LAB — LIPID PANEL
Chol/HDL Ratio: 2.4 ratio (ref 0.0–5.0)
Cholesterol, Total: 156 mg/dL (ref 100–199)
HDL: 64 mg/dL (ref >39)
LDL Chol Calc (NIH): 79 mg/dL (ref 0–99)
Triglycerides: 66 mg/dL (ref 0–149)
VLDL Cholesterol Cal: 13 mg/dL (ref 5–40)

## 2021-09-11 LAB — AEROBIC CULTURE W GRAM STAIN (SUPERFICIAL SPECIMEN): Gram Stain: NONE SEEN

## 2021-09-14 ENCOUNTER — Encounter (HOSPITAL_BASED_OUTPATIENT_CLINIC_OR_DEPARTMENT_OTHER): Payer: Medicaid Other | Admitting: Physician Assistant

## 2021-09-21 ENCOUNTER — Encounter (HOSPITAL_BASED_OUTPATIENT_CLINIC_OR_DEPARTMENT_OTHER): Payer: Medicaid Other | Admitting: Physician Assistant

## 2021-09-21 ENCOUNTER — Other Ambulatory Visit: Payer: Self-pay

## 2021-09-21 DIAGNOSIS — L89314 Pressure ulcer of right buttock, stage 4: Secondary | ICD-10-CM | POA: Diagnosis not present

## 2021-09-21 NOTE — Progress Notes (Addendum)
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 Pre-procedure diagnosis of Wound #9 is a Pressure Ulcer located on the Left Ischium . There was a Chemical/Enzymatic/Mechanical debridement performed by Zenaida Deed, RN.Marland Kitchen Agent used was [employer]. There was no bleeding.  The procedure was tolerated well. Post Debridement Measurements: 2.8cm length x 3cm width x 0.4cm depth; 2.639cm^3 volume. Post debridement Stage noted as Category/Stage III. Character of Wound/Ulcer Post Debridement requires further debridement. Post procedure Diagnosis Wound #9: Same as Pre-Procedure Plan Follow-up Appointments: Return Appointment in 1 week. - with Luana Shu Shower/ Hygiene: May shower with protection but do not get wound dressing(s) wet. Negative Presssure Wound Therapy: Wound #6 Right Ischium: Wound Vac to wound continuously at 15mm/hg pressure - wound center to apply once available Black Foam Off-Loading: Turn and reposition every 2 hours - be sure to lift up off the chair with arms every hour while in wheelchair Other: - pillows under left calf to keep pressure of left lateral ankle Non Wound Condition: Protect area with: - Protect sacrum with vaseline or zinc oxide WOUND #10: - Ankle Wound Laterality: Left, Lateral Cleanser: Soap and Water Every Other Day/30 Days Discharge Instructions: May shower and wash wound with dial antibacterial soap and water prior to dressing change. Prim Dressing: Promogran Prisma Matrix, 4.34 (sq in) (silver collagen) Every Other Day/30 Days ary Discharge Instructions: Moisten collagen with saline or hydrogel Secondary Dressing: Zetuvit Plus Silicone Border Dressing 4x4 (in/in) Every Other Day/30 Days Discharge Instructions: Apply silicone border over  primary dressing as directed. WOUND #11: - Sacrum Wound Laterality: Cleanser: Soap and Water 1 x Per Day/30 Days Discharge Instructions: May shower and wash wound with dial antibacterial soap and water prior to dressing change. Cleanser: Wound Cleanser 1 x Per Day/30 Days Discharge Instructions: Cleanse the wound with wound cleanser prior to applying a clean dressing using gauze sponges, not tissue or cotton balls. Peri-Wound Care: Zinc Oxide Ointment 30g tube 1 x Per Day/30  Days Discharge Instructions: Apply Zinc Oxide to periwound with each dressing change Prim Dressing: Promogran Prisma Matrix, 4.34 (sq in) (silver collagen) 1 x Per Day/30 Days ary Discharge Instructions: Moisten collagen with saline or hydrogel Secondary Dressing: Woven Gauze Sponge, Non-Sterile 4x4 in 1 x Per Day/30 Days Discharge Instructions: Apply over primary dressing as directed. Secondary Dressing: ABD Pad, 5x9 1 x Per Day/30 Days Discharge Instructions: Apply over primary dressing as directed. Secured With: 75M Medipore H Soft Cloth Surgical T 4 x 2 (in/yd) 1 x Per Day/30 Days ape Discharge Instructions: Secure dressing with tape as directed. WOUND #6: - Ischium Wound Laterality: Right Cleanser: Soap and Water 1 x Per Day/30 Days Discharge Instructions: May shower and wash wound with dial antibacterial soap and water prior to dressing change. Cleanser: Wound Cleanser 1 x Per Day/30 Days Discharge Instructions: Cleanse the wound with wound cleanser prior to applying a clean dressing using gauze sponges, not tissue or cotton balls. Prim Dressing: Dakin's Solution 0.25%, 16 (oz) 1 x Per Day/30 Days ary Discharge Instructions: Moisten gauze with Dakin's solution and pack lightly into wound Secondary Dressing: Woven Gauze Sponge, Non-Sterile 4x4 in 1 x Per Day/30 Days Discharge Instructions: Apply over primary dressing as directed. Secondary Dressing: ABD Pad, 5x9 1 x Per Day/30 Days Discharge Instructions: Apply over primary dressing as directed. Secured With: 75M Medipore H Soft Cloth Surgical T 4 x 2 (in/yd) 1 x Per Day/30 Days ape Discharge Instructions: Secure dressing with tape as directed. WOUND #8: - Trochanter Wound Laterality: Left Cleanser: Soap and Water Every Other Day/30 Days Discharge Instructions: May shower and wash wound with dial antibacterial soap and water prior to dressing change. Cleanser: Wound Cleanser Every Other Day/30 Days Discharge Instructions: Cleanse  the wound with wound cleanser prior to applying a clean dressing using gauze sponges, not tissue or cotton balls. Prim Dressing: Promogran Prisma Matrix, 4.34 (sq in) (silver collagen) Every Other Day/30 Days ary Discharge Instructions: Moisten collagen with saline or hydrogel Secondary Dressing: Zetuvit Plus Silicone Border Dressing 4x4 (in/in) Every Other Day/30 Days Discharge Instructions: Apply silicone border over primary dressing as directed. WOUND #9: - Ischium Wound Laterality: Left Cleanser: Soap and Water 1 x Per Day/30 Days Discharge Instructions: May shower and wash wound with dial antibacterial soap and water prior to dressing change. Cleanser: Wound Cleanser 1 x Per Day/30 Days Discharge Instructions: Cleanse the wound with wound cleanser prior to applying a clean dressing using gauze sponges, not tissue or cotton balls. Prim Dressing: Santyl Ointment 1 x Per Day/30 Days ary Discharge Instructions: Apply nickel thick amount to wound bed as instructed Secondary Dressing: Zetuvit Plus Silicone Border Dressing 4x4 (in/in) 1 x Per Day/30 Days Discharge Instructions: Apply silicone border over primary dressing as directed. 1. Would recommend currently that we switch to collagen for the ankle, left hip, and sacral region. 2. Regular continue with the Dakin's moistened gauze for the time being to the right ischial/gluteal location. This is the larger of the wounds. 3. I am also can recommend that we continue  with Santyl to the left ischial location. 4. I am also can recommend the patient should continue with appropriate offloading he does tell me he is trying to is much as he can in that regard obviously the better he can do with keeping pressure off overall the faster this will heal. We will see patient back for reevaluation in 1 week here in the clinic. If anything worsens or changes patient will contact our office for additional recommendations. Electronic Signature(s) Signed:  09/21/2021 3:25:04 PM By: Lenda Kelp PA-C Entered By: Lenda Kelp on 09/21/2021 15:25:04 -------------------------------------------------------------------------------- SuperBill Details Patient Name: Date of Service: NOCHUM, FENTER 09/21/2021 Medical Record Number: [id number] Patient Account Number: [id number] Date of Birth/Sex: Treating RN: [birth year] (30 y.o. 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

## 2021-09-21 NOTE — Progress Notes (Addendum)
[name], [name] ([id number]) Visit Report for 09/21/2021 Arrival Information Details Patient Name: Date of Service: [name], [name] 09/21/2021 2:00 PM Medical Record Number: [id number] Patient Account Number: [id number] Date of Birth/Sex: Treating RN: 21-Aug-1991 (30 y.o. [name] Primary Care Happy Begeman: PCP, NO Other Clinician: Referring Thales Knipple: Treating Corryn Madewell/Extender: [name] in Treatment: 27 Visit Information History Since Last Visit Added or deleted any medications: No Patient Arrived: Wheel Chair Any new allergies or adverse reactions: No Arrival Time: 14:20 Had a fall or experienced change in No Accompanied By: self activities of daily living that may affect Transfer Assistance: None risk of falls: Patient Identification Verified: Yes Signs or symptoms of abuse/neglect since last visito No Secondary Verification Process Completed: Yes Hospitalized since last visit: No Patient Requires Transmission-Based Precautions: No Implantable device outside of the clinic excluding No Patient Has Alerts: No cellular tissue based products placed in the center since last visit: Has Dressing in Place as Prescribed: Yes Pain Present Now: No Electronic Signature(s) Signed: 09/21/2021 5:09:33 PM By: Zenaida Deed RN, BSN Entered By: Zenaida Deed on 09/21/2021 14:24:16 -------------------------------------------------------------------------------- Encounter Discharge Information Details Patient Name: Date of Service: [name] March L. 09/21/2021 2:00 PM Medical Record Number: [id number] Patient Account Number: [id number] Date of Birth/Sex: Treating RN: 01-19-1991 (30 y.o. [name] Primary Care Zaidan Keeble: PCP, NO Other Clinician: Referring Jeromie Gainor: Treating Sajid Ruppert/Extender: [name] in Treatment: 14 Encounter Discharge Information Items Post Procedure Vitals Discharge Condition:  Stable Temperature (F): 98.5 Ambulatory Status: Wheelchair Pulse (bpm): 106 Discharge Destination: Home Respiratory Rate (breaths/min): 18 Transportation: Other Blood Pressure (mmHg): 158/103 Accompanied By: self Schedule Follow-up Appointment: Yes Clinical Summary of Care: Patient Declined Notes transportation service Electronic Signature(s) Signed: 09/21/2021 5:09:33 PM By: Zenaida Deed RN, BSN Entered By: Zenaida Deed on 09/21/2021 15:29:37 -------------------------------------------------------------------------------- Lower Extremity Assessment Details Patient Name: Date of Service: [name], [name] 09/21/2021 2:00 PM Medical Record Number: [id number] Patient Account Number: [id number] Date of Birth/Sex: Treating RN: 10-24-1991 (30 y.o. [name] Primary Care Ellaree Gear: PCP, NO Other Clinician: Referring Melitza Metheny: Treating Katriana Dortch/Extender: Angus Palms Weeks in Treatment: 59 Edema Assessment Assessed: [Left: No] [Right: No] Edema: [Left: N] [Right: o] Calf Left: Right: Point of Measurement: From Medial Instep 29.5 cm Ankle Left: Right: Point of Measurement: From Medial Instep 21 cm Vascular Assessment Pulses: Dorsalis Pedis Palpable: [Left:Yes] Electronic Signature(s) Signed: 09/21/2021 5:09:33 PM By: Zenaida Deed RN, BSN Entered By: Zenaida Deed on 09/21/2021 14:28:55 -------------------------------------------------------------------------------- Multi Wound Chart Details Patient Name: Date of Service: [name] March L. 09/21/2021 2:00 PM Medical Record Number: [id number] Patient Account Number: [id number] Date of Birth/Sex: Treating RN: 01-14-1991 (30 y.o. [name] Primary Care Gracyn Allor: PCP, NO Other Clinician: Referring Odin Mariani: Treating Visente Kirker/Extender: [name] in Treatment: 59 Vital Signs Height(in): 71 Pulse(bpm): 106 Weight(lbs): 136 Blood  Pressure(mmHg): 158/103 Body Mass Index(BMI): 19 Temperature(F): 98.5 Respiratory Rate(breaths/min): 18 Photos: [10:Left, Lateral Ankle] [11:Sacrum] [6:Right Ischium] Wound Location: [10:Pressure Injury] [11:Pressure Injury] [6:Trauma] Wounding Event: [10:Pressure Ulcer] [11:Pressure Ulcer] [6:Pressure Ulcer] Primary Etiology: [10:Paraplegia] [11:Paraplegia] [6:Paraplegia] Comorbid History: [10:08/13/2021] [11:09/07/2021] [6:02/10/2021] Date Acquired: [10:4] [11:2] [6:31] Weeks of Treatment: [10:Open] [11:Open] [6:Open] Wound Status: [10:0.8x1.3x0.2] [11:0.8x1.5x0.1] [6:5.3x3x2.4] Measurements L [name] W [name] D (cm) [10:0.817] [11:0.942] [6:12.488] A (cm) : rea [10:0.163] [11:0.094] [6:29.971] Volume (cm) : [10:75.20%] [11:14.40%] [6:18.50%] % Reduction in A rea: [10:75.30%] [11:14.50%] [6:-1856.30%] % Reduction in Volume: [6:12] Starting Position 1 (o'clock): [6:5]  Ending Position 1 (o'clock): [6:3] Maximum Distance 1 (cm): [10:No] [11:No] [6:Yes] Undermining: [10:Category/Stage III] [11:Category/Stage III] [6:Category/Stage IV] Classification: [10:Medium] [11:Medium] [6:Medium] Exudate A mount: [10:Serosanguineous] [11:Serous] [6:Serous] Exudate Type: [10:red, brown] [11:amber] [6:amber] Exudate Color: [10:Distinct, outline attached] [11:Flat and Intact] [6:Well defined, not attached] Wound Margin: [10:Medium (34-66%)] [11:Medium (34-66%)] [6:Large (67-100%)] Granulation A mount: [10:Red, Pink] [11:Pink, Pale] [6:Red, Pink] Granulation Quality: [10:Medium (34-66%)] [11:Medium (34-66%)] [6:Small (1-33%)] Necrotic A mount: [10:Fat Layer (Subcutaneous Tissue): Yes Fat Layer (Subcutaneous Tissue): Yes Fat Layer (Subcutaneous Tissue): Yes] Exposed Structures: [10:Fascia: No Tendon: No Muscle: No Joint: No Bone: No Small (1-33%)] [11:Fascia: No Tendon: No Muscle: No Joint: No Bone: No None] [6:Fascia: No Tendon: No Muscle: No Joint: No Bone: No None] Epithelialization: [10:N/A] [11:N/A]  [6:N/A] Debridement: [10:N/A] [11:N/A] [6:N/A] Instrument: [10:N/A] [11:N/A] [6:N/A] Bleeding: Debridement Treatment Response: N/A [11:N/A] [6:N/A] Post Debridement Measurements L [name] N/A [11:N/A] [6:N/A] W [name] D (cm) [10:N/A] [11:N/A] [6:N/A] Post Debridement Volume: (cm) [10:N/A] [11:N/A] [6:N/A] Post Debridement Stage: [10:N/A] [11:N/A] [6:N/A] Wound Number: 8 9 N/A Photos: N/A Left Trochanter Left Ischium N/A Wound Location: Pressure Injury Pressure Injury N/A Wounding Event: Pressure Ulcer Pressure Ulcer N/A Primary Etiology: Paraplegia Paraplegia N/A Comorbid History: 05/09/2021 05/09/2021 N/A Date Acquired: 35 18 N/A Weeks of Treatment: Open Open N/A Wound Status: 1.3x1.8x0.1 2.8x3x0.4 N/A Measurements L [name] W [name] D (cm) 1.838 6.597 N/A A (cm) : rea 0.184 2.639 N/A Volume (cm) : -377.40% -190.60% N/A % Reduction in A rea: -384.20% -1062.60% N/A % Reduction in Volume: No No N/A Undermining: Category/Stage IV Category/Stage III N/A Classification: Medium Medium N/A Exudate A mount: Serous Serous N/A Exudate Type: amber amber N/A Exudate Color: Distinct, outline attached Distinct, outline attached N/A Wound Margin: Large (67-100%) Small (1-33%) N/A Granulation A mount: Red N/A N/A Granulation Quality: None Present (0%) Large (67-100%) N/A Necrotic A mount: Fat Layer (Subcutaneous Tissue): Yes Fat Layer (Subcutaneous Tissue): Yes N/A Exposed Structures: Fascia: No Fascia: No Tendon: No Tendon: No Muscle: No Muscle: No Joint: No Joint: No Bone: No Bone: No Small (1-33%) Small (1-33%) N/A Epithelialization: N/A Chemical/Enzymatic/Mechanical N/A Debridement: N/A N/A N/A Instrument: N/A None N/A Bleeding: Debridement Treatment Response: N/A Procedure was tolerated well N/A Post Debridement Measurements L [name] N/A 2.8x3x0.4 N/A W [name] D (cm) N/A 2.639 N/A Post Debridement Volume: (cm) N/A Category/Stage III N/A Post Debridement Stage: N/A Debridement  N/A Procedures Performed: Treatment Notes Wound #10 (Ankle) Wound Laterality: Left, Lateral Cleanser Soap and Water Discharge Instruction: May shower and wash wound with dial antibacterial soap and water prior to dressing change. Peri-Wound Care Topical Primary Dressing Promogran Prisma Matrix, 4.34 (sq in) (silver collagen) Discharge Instruction: Moisten collagen with saline or hydrogel Secondary Dressing Zetuvit Plus Silicone Border Dressing 4x4 (in/in) Discharge Instruction: Apply silicone border over primary dressing as directed. Secured With Compression Wrap Compression Stockings Add-Ons Wound #11 (Sacrum) Cleanser Soap and Water Discharge Instruction: May shower and wash wound with dial antibacterial soap and water prior to dressing change. Wound Cleanser Discharge Instruction: Cleanse the wound with wound cleanser prior to applying a clean dressing using gauze sponges, not tissue or cotton balls. Peri-Wound Care Zinc Oxide Ointment 30g tube Discharge Instruction: Apply Zinc Oxide to periwound with each dressing change Topical Primary Dressing Promogran Prisma Matrix, 4.34 (sq in) (silver collagen) Discharge Instruction: Moisten collagen with saline or hydrogel Secondary Dressing Woven Gauze Sponge, Non-Sterile 4x4 in Discharge Instruction: Apply over primary dressing as directed. ABD Pad, 5x9 Discharge Instruction: Apply over primary dressing as directed. Secured  With 8M Medipore H Soft Cloth Surgical T 4 [name] 2 (in/yd) ape Discharge Instruction: Secure dressing with tape as directed. Compression Wrap Compression Stockings Add-Ons Wound #6 (Ischium) Wound Laterality: Right Cleanser Soap and Water Discharge Instruction: May shower and wash wound with dial antibacterial soap and water prior to dressing change. Wound Cleanser Discharge Instruction: Cleanse the wound with wound cleanser prior to applying a clean dressing using gauze sponges, not tissue or cotton  balls. Peri-Wound Care Topical Primary Dressing Dakin's Solution 0.25%, 16 (oz) Discharge Instruction: Moisten gauze with Dakin's solution and pack lightly into wound Secondary Dressing Woven Gauze Sponge, Non-Sterile 4x4 in Discharge Instruction: Apply over primary dressing as directed. ABD Pad, 5x9 Discharge Instruction: Apply over primary dressing as directed. Secured With 8M Medipore H Soft Cloth Surgical T 4 [name] 2 (in/yd) ape Discharge Instruction: Secure dressing with tape as directed. Compression Wrap Compression Stockings Add-Ons Wound #8 (Trochanter) Wound Laterality: Left Cleanser Soap and Water Discharge Instruction: May shower and wash wound with dial antibacterial soap and water prior to dressing change. Wound Cleanser Discharge Instruction: Cleanse the wound with wound cleanser prior to applying a clean dressing using gauze sponges, not tissue or cotton balls. Peri-Wound Care Topical Primary Dressing Promogran Prisma Matrix, 4.34 (sq in) (silver collagen) Discharge Instruction: Moisten collagen with saline or hydrogel Secondary Dressing Zetuvit Plus Silicone Border Dressing 4x4 (in/in) Discharge Instruction: Apply silicone border over primary dressing as directed. Secured With Compression Wrap Compression Stockings Add-Ons Wound #9 (Ischium) Wound Laterality: Left Cleanser Soap and Water Discharge Instruction: May shower and wash wound with dial antibacterial soap and water prior to dressing change. Wound Cleanser Discharge Instruction: Cleanse the wound with wound cleanser prior to applying a clean dressing using gauze sponges, not tissue or cotton balls. Peri-Wound Care Topical Primary Dressing Santyl Ointment Discharge Instruction: Apply nickel thick amount to wound bed as instructed Secondary Dressing Zetuvit Plus Silicone Border Dressing 4x4 (in/in) Discharge Instruction: Apply silicone border over primary dressing as directed. Secured  With Compression Wrap Compression Stockings Facilities manager) Signed: 09/22/2021 5:29:08 PM By: Zenaida Deed RN, BSN Entered By: Zenaida Deed on 09/22/2021 12:37:03 -------------------------------------------------------------------------------- Multi-Disciplinary Care Plan Details Patient Name: Date of Service: [name], [name] 09/21/2021 2:00 PM Medical Record Number: [id number] Patient Account Number: [id number] Date of Birth/Sex: Treating RN: [birth year] (30 y.o. [name] Primary Care Linsie Lupo: PCP, NO Other Clinician: Referring Jonan Seufert: Treating Asriel Westrup/Extender: [name] in Treatment: 59 Multidisciplinary Care Plan reviewed with physician Active Inactive Pressure Nursing Diagnoses: Knowledge deficit related to causes and risk factors for pressure ulcer development Goals: Patient/caregiver will verbalize risk factors for pressure ulcer development Date Initiated: 07/30/2020 Target Resolution Date: 10/19/2021 Goal Status: Active Interventions: Provide education on pressure ulcers Notes: 06/08/21: Patient recently had new pressure areas, target date extended. Wound/Skin Impairment Nursing Diagnoses: Impaired tissue integrity Goals: Patient/caregiver will verbalize understanding of skin care regimen Date Initiated: 01/05/2021 Target Resolution Date: 10/19/2021 Goal Status: Active Ulcer/skin breakdown will have a volume reduction of 50% by week 8 Date Initiated: 07/30/2020 Date Inactivated: 10/20/2020 Target Resolution Date: 09/27/2020 Goal Status: Unmet Unmet Reason: Infection Ulcer/skin breakdown will have a volume reduction of 80% by week 12 Date Initiated: 10/20/2020 Date Inactivated: 12/22/2020 Target Resolution Date: 11/19/2020 Unmet Reason: paraplegic, difficulty Goal Status: Unmet offloading Interventions: Provide education on ulcer and skin care Notes: 06/08/21: wound care ongoing. Electronic  Signature(s) Signed: 09/21/2021 5:09:33 PM By: Zenaida Deed RN, BSN Entered By: Zenaida Deed on 09/21/2021 14:49:48 --------------------------------------------------------------------------------  Pain Assessment Details Patient Name: Date of Service: [name], [name] 09/21/2021 2:00 PM Medical Record Number: [id number] Patient Account Number: [id number] Date of Birth/Sex: Treating RN: 10-Jun-1991 (30 y.o. [name] Primary Care Coyle Stordahl: PCP, NO Other Clinician: Referring Wilferd Ritson: Treating Lelah Rennaker/Extender: [name] in Treatment: 59 Active Problems Location of Pain Severity and Description of Pain Patient Has Paino No Site Locations Rate the pain. Current Pain Level: 0 Pain Management and Medication Current Pain Management: Electronic Signature(s) Signed: 09/21/2021 5:09:33 PM By: Zenaida Deed RN, BSN Entered By: Zenaida Deed on 09/21/2021 14:27:37 -------------------------------------------------------------------------------- Patient/Caregiver Education Details Patient Name: Date of Service: [name] 10/26/2022andnbsp2:00 PM Medical Record Number: [id number] Patient Account Number: [id number] Date of Birth/Gender: Treating RN: Aug 19, 1991 (30 y.o. [name] Primary Care Physician: PCP, NO Other Clinician: Referring Physician: Treating Physician/Extender: [name] in Treatment: 61 Education Assessment Education Provided To: Patient Education Topics Provided Pressure: Methods: Explain/Verbal Responses: Reinforcements needed, State content correctly Wound/Skin Impairment: Methods: Explain/Verbal Responses: Reinforcements needed, State content correctly Electronic Signature(s) Signed: 09/21/2021 5:09:33 PM By: Zenaida Deed RN, BSN Entered By: Zenaida Deed on 09/21/2021  14:50:18 -------------------------------------------------------------------------------- Wound Assessment Details Patient Name: Date of Service: [name] Corner NIO L. 09/21/2021 2:00 PM Medical Record Number: [id number] Patient Account Number: [id number] Date of Birth/Sex: Treating RN: [birth year] (30 y.o. [name] Primary Care Lissa Rowles: PCP, NO Other Clinician: Referring Britteny Fiebelkorn: Treating Mohmmad Saleeby/Extender: Angus Palms Weeks in Treatment: 59 Wound Status Wound Number: 10 Primary Etiology: Pressure Ulcer Wound Location: Left, Lateral Ankle Wound Status: Open Wounding Event: Pressure Injury Comorbid History: Paraplegia Date Acquired: 08/13/2021 Weeks Of Treatment: 4 Clustered Wound: No Photos Wound Measurements Length: (cm) 0.8 Width: (cm) 1.3 Depth: (cm) 0.2 Area: (cm) 0.817 Volume: (cm) 0.163 % Reduction in Area: 75.2% % Reduction in Volume: 75.3% Epithelialization: Small (1-33%) Tunneling: No Undermining: No Wound Description Classification: Category/Stage III Wound Margin: Distinct, outline attached Exudate Amount: Medium Exudate Type: Serosanguineous Exudate Color: red, brown Foul Odor After Cleansing: No Slough/Fibrino Yes Wound Bed Granulation Amount: Medium (34-66%) Exposed Structure Granulation Quality: Red, Pink Fascia Exposed: No Necrotic Amount: Medium (34-66%) Fat Layer (Subcutaneous Tissue) Exposed: Yes Necrotic Quality: Adherent Slough Tendon Exposed: No Muscle Exposed: No Joint Exposed: No Bone Exposed: No Treatment Notes Wound #10 (Ankle) Wound Laterality: Left, Lateral Cleanser Soap and Water Discharge Instruction: May shower and wash wound with dial antibacterial soap and water prior to dressing change. Peri-Wound Care Topical Primary Dressing Promogran Prisma Matrix, 4.34 (sq in) (silver collagen) Discharge Instruction: Moisten collagen with saline or hydrogel Secondary Dressing Zetuvit Plus Silicone  Border Dressing 4x4 (in/in) Discharge Instruction: Apply silicone border over primary dressing as directed. Secured With Compression Wrap Compression Stockings Facilities manager) Signed: 09/21/2021 5:09:33 PM By: Zenaida Deed RN, BSN Signed: 09/21/2021 5:19:20 PM By: Antonieta Iba Entered By: Antonieta Iba on 09/21/2021 14:33:10 -------------------------------------------------------------------------------- Wound Assessment Details Patient Name: Date of Service: [name], [name] 09/21/2021 2:00 PM Medical Record Number: [id number] Patient Account Number: [id number] Date of Birth/Sex: Treating RN: 12-08-1990 (30 y.o. [name] Primary Care Cincere Deprey: PCP, NO Other Clinician: Referring Shellye Zandi: Treating Lynford Espinoza/Extender: Angus Palms Weeks in Treatment: 59 Wound Status Wound Number: 11 Primary Etiology: Pressure Ulcer Wound Location: Sacrum Wound Status: Open Wounding Event: Pressure Injury Comorbid History: Paraplegia Date Acquired: 09/07/2021 Weeks Of Treatment: 2 Clustered Wound: No Photos Wound Measurements Length: (cm) 0.8 Width: (cm) 1.5 Depth: (cm)  0.1 Area: (cm) 0.942 Volume: (cm) 0.094 Wound Description Classification: Category/Stage III Wound Margin: Flat and Intact Exudate Amount: Medium Exudate Type: Serous Exudate Color: amber Foul Odor After Cleansing: Slough/Fibrino % Reduction in Area: 14.4% % Reduction in Volume: 14.5% Epithelialization: None Tunneling: No Undermining: No No Yes Wound Bed Granulation Amount: Medium (34-66%) Exposed Structure Granulation Quality: Pink, Pale Fascia Exposed: No Necrotic Amount: Medium (34-66%) Fat Layer (Subcutaneous Tissue) Exposed: Yes Necrotic Quality: Adherent Slough Tendon Exposed: No Muscle Exposed: No Joint Exposed: No Bone Exposed: No Treatment Notes Wound #11 (Sacrum) Cleanser Soap and Water Discharge Instruction: May shower and wash wound  with dial antibacterial soap and water prior to dressing change. Wound Cleanser Discharge Instruction: Cleanse the wound with wound cleanser prior to applying a clean dressing using gauze sponges, not tissue or cotton balls. Peri-Wound Care Zinc Oxide Ointment 30g tube Discharge Instruction: Apply Zinc Oxide to periwound with each dressing change Topical Primary Dressing Promogran Prisma Matrix, 4.34 (sq in) (silver collagen) Discharge Instruction: Moisten collagen with saline or hydrogel Secondary Dressing Woven Gauze Sponge, Non-Sterile 4x4 in Discharge Instruction: Apply over primary dressing as directed. ABD Pad, 5x9 Discharge Instruction: Apply over primary dressing as directed. Secured With 5M Medipore H Soft Cloth Surgical T 4 [name] 2 (in/yd) ape Discharge Instruction: Secure dressing with tape as directed. Compression Wrap Compression Stockings Add-Ons Electronic Signature(s) Signed: 09/21/2021 5:19:20 PM By: Antonieta Iba Entered By: Antonieta Iba on 09/21/2021 14:37:16 -------------------------------------------------------------------------------- Wound Assessment Details Patient Name: Date of Service: [name], [name] NIO L. 09/21/2021 2:00 PM Medical Record Number: [id number] Patient Account Number: [id number] Date of Birth/Sex: Treating RN: Jul 06, 1991 (30 y.o. [name] Primary Care Quana Chamberlain: PCP, NO Other Clinician: Referring Brit Wernette: Treating Latrice Storlie/Extender: Angus Palms Weeks in Treatment: 59 Wound Status Wound Number: 6 Primary Etiology: Pressure Ulcer Wound Location: Right Ischium Wound Status: Open Wounding Event: Trauma Comorbid History: Paraplegia Date Acquired: 02/10/2021 Weeks Of Treatment: 31 Clustered Wound: No Photos Wound Measurements Length: (cm) 5.3 Width: (cm) 3 Depth: (cm) 2.4 Area: (cm) 12.488 Volume: (cm) 29.971 % Reduction in Area: 18.5% % Reduction in Volume: -1856.3% Epithelialization:  None Tunneling: No Undermining: Yes Starting Position (o'clock): 12 Ending Position (o'clock): 5 Maximum Distance: (cm) 3 Wound Description Classification: Category/Stage IV Wound Margin: Well defined, not attached Exudate Amount: Medium Exudate Type: Serous Exudate Color: amber Foul Odor After Cleansing: No Slough/Fibrino Yes Wound Bed Granulation Amount: Large (67-100%) Exposed Structure Granulation Quality: Red, Pink Fascia Exposed: No Necrotic Amount: Small (1-33%) Fat Layer (Subcutaneous Tissue) Exposed: Yes Necrotic Quality: Adherent Slough Tendon Exposed: No Muscle Exposed: No Joint Exposed: No Bone Exposed: No Treatment Notes Wound #6 (Ischium) Wound Laterality: Right Cleanser Soap and Water Discharge Instruction: May shower and wash wound with dial antibacterial soap and water prior to dressing change. Wound Cleanser Discharge Instruction: Cleanse the wound with wound cleanser prior to applying a clean dressing using gauze sponges, not tissue or cotton balls. Peri-Wound Care Topical Primary Dressing Dakin's Solution 0.25%, 16 (oz) Discharge Instruction: Moisten gauze with Dakin's solution and pack lightly into wound Secondary Dressing Woven Gauze Sponge, Non-Sterile 4x4 in Discharge Instruction: Apply over primary dressing as directed. ABD Pad, 5x9 Discharge Instruction: Apply over primary dressing as directed. Secured With 5M Medipore H Soft Cloth Surgical T 4 [name] 2 (in/yd) ape Discharge Instruction: Secure dressing with tape as directed. Compression Wrap Compression Stockings Add-Ons Electronic Signature(s) Signed: 09/21/2021 5:19:20 PM By: Antonieta Iba Entered By: Antonieta Iba on 09/21/2021 14:48:17 -------------------------------------------------------------------------------- Wound Assessment Details  Patient Name: Date of Service: [name], [name] 09/21/2021 2:00 PM Medical Record Number: [id number] Patient Account Number: [id number] Date  of Birth/Sex: Treating RN: [birth year] (30 y.o. [name] Primary Care Cortland Crehan: PCP, NO Other Clinician: Referring Jaz Mallick: Treating Dalphine Cowie/Extender: Angus Palms Weeks in Treatment: 59 Wound Status Wound Number: 8 Primary Etiology: Pressure Ulcer Wound Location: Left Trochanter Wound Status: Open Wounding Event: Pressure Injury Comorbid History: Paraplegia Date Acquired: 05/09/2021 Weeks Of Treatment: 18 Clustered Wound: No Photos Wound Measurements Length: (cm) 1.3 Width: (cm) 1.8 Depth: (cm) 0.1 Area: (cm) 1.838 Volume: (cm) 0.184 % Reduction in Area: -377.4% % Reduction in Volume: -384.2% Epithelialization: Small (1-33%) Tunneling: No Undermining: No Wound Description Classification: Category/Stage IV Wound Margin: Distinct, outline attached Exudate Amount: Medium Exudate Type: Serous Exudate Color: amber Foul Odor After Cleansing: No Slough/Fibrino Yes Wound Bed Granulation Amount: Large (67-100%) Exposed Structure Granulation Quality: Red Fascia Exposed: No Necrotic Amount: None Present (0%) Fat Layer (Subcutaneous Tissue) Exposed: Yes Tendon Exposed: No Muscle Exposed: No Joint Exposed: No Bone Exposed: No Treatment Notes Wound #8 (Trochanter) Wound Laterality: Left Cleanser Soap and Water Discharge Instruction: May shower and wash wound with dial antibacterial soap and water prior to dressing change. Wound Cleanser Discharge Instruction: Cleanse the wound with wound cleanser prior to applying a clean dressing using gauze sponges, not tissue or cotton balls. Peri-Wound Care Topical Primary Dressing Promogran Prisma Matrix, 4.34 (sq in) (silver collagen) Discharge Instruction: Moisten collagen with saline or hydrogel Secondary Dressing Zetuvit Plus Silicone Border Dressing 4x4 (in/in) Discharge Instruction: Apply silicone border over primary dressing as directed. Secured With Compression Wrap Compression  Stockings Facilities manager) Signed: 09/21/2021 5:19:20 PM By: Antonieta Iba Entered By: Antonieta Iba on 09/21/2021 14:47:23 -------------------------------------------------------------------------------- Wound Assessment Details Patient Name: Date of Service: XAVIEN, DAUPHINAIS NIO L. 09/21/2021 2:00 PM Medical Record Number: [id number] Patient Account Number: [id number] Date of Birth/Sex: Treating RN: 08-04-1991 (30 y.o. [name] Primary Care Linsi Humann: PCP, NO Other Clinician: Referring Yennifer Segovia: Treating [name] Butler/Extender: Angus Palms Weeks in Treatment: 59 Wound Status Wound Number: 9 Primary Etiology: Pressure Ulcer Wound Location: Left Ischium Wound Status: Open Wounding Event: Pressure Injury Comorbid History: Paraplegia Date Acquired: 05/09/2021 Weeks Of Treatment: 18 Clustered Wound: No Photos Wound Measurements Length: (cm) 2.8 Width: (cm) 3 Depth: (cm) 0.4 Area: (cm) 6.597 Volume: (cm) 2.639 % Reduction in Area: -190.6% % Reduction in Volume: -1062.6% Epithelialization: Small (1-33%) Tunneling: No Undermining: No Wound Description Classification: Category/Stage III Wound Margin: Distinct, outline attached Exudate Amount: Medium Exudate Type: Serous Exudate Color: amber Foul Odor After Cleansing: No Slough/Fibrino No Wound Bed Granulation Amount: Small (1-33%) Exposed Structure Necrotic Amount: Large (67-100%) Fascia Exposed: No Necrotic Quality: Adherent Slough Fat Layer (Subcutaneous Tissue) Exposed: Yes Tendon Exposed: No Muscle Exposed: No Joint Exposed: No Bone Exposed: No Treatment Notes Wound #9 (Ischium) Wound Laterality: Left Cleanser Soap and Water Discharge Instruction: May shower and wash wound with dial antibacterial soap and water prior to dressing change. Wound Cleanser Discharge Instruction: Cleanse the wound with wound cleanser prior to applying a clean dressing using gauze sponges,  not tissue or cotton balls. Peri-Wound Care Topical Primary Dressing Santyl Ointment Discharge Instruction: Apply nickel thick amount to wound bed as instructed Secondary Dressing Zetuvit Plus Silicone Border Dressing 4x4 (in/in) Discharge Instruction: Apply silicone border over primary dressing as directed. Secured With Compression Wrap Compression Stockings Facilities manager) Signed: 09/21/2021 5:19:20 PM By: Antonieta Iba Entered By: Antonieta Iba on  09/21/2021 14:46:18 -------------------------------------------------------------------------------- Vitals Details Patient Name: Date of Service: JAESHAUN, RIVA 09/21/2021 2:00 PM Medical Record Number: [id number] Patient Account Number: [id number] Date of Birth/Sex: Treating RN: [birth year] (30 y.o. [name] Primary Care Consetta Cosner: PCP, NO Other Clinician: Referring Sherylann Vangorden: Treating Duanne Duchesne/Extender: [name] in Treatment: 59 Vital Signs Time Taken: 14:25 Temperature (F): 98.5 Height (in): 71 Pulse (bpm): 106 Source: Stated Respiratory Rate (breaths/min): 18 Weight (lbs): 136 Blood Pressure (mmHg): 158/103 Source: Stated Reference Range: 80 - 120 mg / dl Body Mass Index (BMI): 19 Electronic Signature(s) Signed: 09/21/2021 5:09:33 PM By: Zenaida Deed RN, BSN Entered By: Zenaida Deed on 09/21/2021 14:25:24

## 2021-09-28 ENCOUNTER — Other Ambulatory Visit: Payer: Self-pay

## 2021-09-28 ENCOUNTER — Encounter (HOSPITAL_BASED_OUTPATIENT_CLINIC_OR_DEPARTMENT_OTHER): Payer: Medicaid Other | Attending: Physician Assistant | Admitting: Physician Assistant

## 2021-09-28 DIAGNOSIS — E43 Unspecified severe protein-calorie malnutrition: Secondary | ICD-10-CM | POA: Diagnosis not present

## 2021-09-28 DIAGNOSIS — G8221 Paraplegia, complete: Secondary | ICD-10-CM | POA: Insufficient documentation

## 2021-09-28 DIAGNOSIS — L89223 Pressure ulcer of left hip, stage 3: Secondary | ICD-10-CM | POA: Insufficient documentation

## 2021-09-28 DIAGNOSIS — F4310 Post-traumatic stress disorder, unspecified: Secondary | ICD-10-CM | POA: Diagnosis not present

## 2021-09-28 DIAGNOSIS — Z7901 Long term (current) use of anticoagulants: Secondary | ICD-10-CM | POA: Diagnosis not present

## 2021-09-28 DIAGNOSIS — L89314 Pressure ulcer of right buttock, stage 4: Secondary | ICD-10-CM | POA: Insufficient documentation

## 2021-09-28 DIAGNOSIS — F17200 Nicotine dependence, unspecified, uncomplicated: Secondary | ICD-10-CM | POA: Diagnosis not present

## 2021-09-28 DIAGNOSIS — L89324 Pressure ulcer of left buttock, stage 4: Secondary | ICD-10-CM | POA: Diagnosis not present

## 2021-09-28 DIAGNOSIS — L89159 Pressure ulcer of sacral region, unspecified stage: Secondary | ICD-10-CM | POA: Insufficient documentation

## 2021-09-28 NOTE — Progress Notes (Addendum)
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 is Open. Original cause of wound was Pressure Injury. The date acquired was: 05/09/2021. The wound has been in treatment 19 weeks. The wound is located on the  Left Trochanter. The wound measures 0.7cm length x 1.1cm width x 0.1cm depth; 0.605cm^2 area and 0.06cm^3 volume. There is Fat Layer (Subcutaneous Tissue) exposed. There is no tunneling or undermining noted. There is a medium amount of serous drainage noted. The wound margin is distinct with the outline attached to the wound base. There is large (67-100%) red granulation within the wound bed. There is no necrotic tissue within the wound bed. Wound #9 status is Open. Original cause of wound was Pressure Injury. The date acquired was: 05/09/2021. The wound has been in treatment 19 weeks. The wound is located on the Left Ischium. The wound measures 2.2cm length x 2.5cm width x 0.3cm depth; 4.32cm^2 area and 1.296cm^3 volume. There is Fat Layer (Subcutaneous Tissue) exposed. There is no tunneling or undermining noted. There is a medium amount of serous drainage noted. The wound margin is distinct with the outline attached to the wound base. There is medium (34-66%) red, pink granulation within the wound bed. There is a medium (34-66%) amount of necrotic tissue within the wound bed including Adherent Slough. Assessment Active Problems ICD-10 Pressure ulcer of right buttock, stage 4 Pressure ulcer of left buttock, stage 4 Pressure ulcer of left hip, stage 3 Unspecified severe protein-calorie malnutrition Paraplegia, complete Other chronic  osteomyelitis, other site Procedures Wound #9 Pre-procedure diagnosis of Wound #9 is a Pressure Ulcer located on the Left Ischium . There was a Chemical/Enzymatic/Mechanical debridement performed by [name] Pilling, RN.Marland Kitchen Agent used was [employer]. There was no bleeding. The procedure was tolerated well. Post Debridement Measurements: 2.2cm length x 2.5cm width x 0.3cm depth; 1.296cm^3 volume. Post debridement Stage noted as Category/Stage III. Character of Wound/Ulcer Post Debridement requires further debridement. Post procedure Diagnosis Wound #9: Same as  Pre-Procedure Wound #8 Pre-procedure diagnosis of Wound #8 is a Pressure Ulcer located on the Left Trochanter . An Chemical Cauterization procedure was performed by [name] Keeler, PA. Post procedure Diagnosis Wound #8: Same as Pre-Procedure Notes: silver nitrate used. Plan Follow-up Appointments: Return Appointment in 1 week. - Thursdays Dr. Heber Carter or Dr. Dellia Nims Nurse Visit: - Monday wound vac change. Bathing/ Shower/ Hygiene: May shower with protection but do not get wound dressing(s) wet. Negative Presssure Wound Therapy: Wound #6 Right Ischium: Wound Vac to wound continuously at 150mm/hg pressure - apply to right ischium change twice a week. Black Foam Off-Loading: Turn and reposition every 2 hours - be sure to lift up off the chair with arms every hour while in wheelchair Other: - pillows under left calf to keep pressure of left lateral ankle Non Wound Condition: Protect area with: - Protect sacrum with vaseline or zinc oxide WOUND #10: - Ankle Wound Laterality: Left, Lateral Cleanser: Soap and Water Every Other Day/30 Days Discharge Instructions: May shower and wash wound with dial antibacterial soap and water prior to dressing change. Prim Dressing: Promogran Prisma Matrix, 4.34 (sq in) (silver collagen) Every Other Day/30 Days ary Discharge Instructions: Moisten collagen with saline or hydrogel Secondary Dressing: Zetuvit Plus Silicone Border Dressing 4x4 (in/in) Every Other Day/30 Days Discharge Instructions: Apply silicone border over primary dressing as directed. WOUND #11: - Sacrum Wound Laterality: Cleanser: Soap and Water 1 x Per Day/30 Days Discharge Instructions: May shower and wash wound with dial antibacterial soap and water prior to dressing change. Cleanser: Wound Cleanser 1 x Per Day/30 Days Discharge Instructions: Cleanse the wound with wound cleanser prior to applying a clean dressing using gauze sponges, not tissue or cotton balls. Peri-Wound Care: Zinc  Oxide Ointment 30g tube 1 x Per Day/30 Days Discharge Instructions: Apply Zinc Oxide to periwound with each dressing change Prim Dressing: Promogran Prisma Matrix, 4.34 (sq in) (silver collagen) 1 x Per Day/30 Days ary Discharge Instructions: Moisten collagen with saline or hydrogel Secondary Dressing: Woven Gauze Sponge, Non-Sterile 4x4 in 1 x Per Day/30 Days Discharge Instructions: Apply over primary dressing as directed. Secondary Dressing: ABD Pad, 5x9 1 x Per Day/30 Days Discharge Instructions: Apply over primary dressing as directed. Secured With: 71M Medipore H Soft Cloth Surgical T 4 x 2 (in/yd) 1 x Per Day/30 Days ape Discharge Instructions: Secure dressing with tape as directed. WOUND #6: - Ischium Wound Laterality: Right Cleanser: Soap and Water 2 x Per Week/30 Days Discharge Instructions: May shower and wash wound with dial antibacterial soap and water prior to dressing change. Cleanser: Wound Cleanser 2 x Per Week/30 Days Discharge Instructions: Cleanse the wound with wound cleanser prior to applying a clean dressing using gauze sponges, not tissue or cotton balls. Prim Dressing: wound vac 188mmHg continuous negative pressure 2 x Per Week/30 Days ary Discharge Instructions: black foam WOUND #8: - Trochanter Wound Laterality: Left Cleanser: Soap and Water Every Other Day/30 Days Discharge Instructions: May shower and wash wound with dial antibacterial soap and water prior to  dressing change. Cleanser: Wound Cleanser Every Other Day/30 Days Discharge Instructions: Cleanse the wound with wound cleanser prior to applying a clean dressing using gauze sponges, not tissue or cotton balls. Prim Dressing: Promogran Prisma Matrix, 4.34 (sq in) (silver collagen) Every Other Day/30 Days ary Discharge Instructions: Moisten collagen with saline or hydrogel Secondary Dressing: Zetuvit Plus Silicone Border Dressing 4x4 (in/in) Every Other Day/30 Days Discharge Instructions: Apply silicone  border over primary dressing as directed. WOUND #9: - Ischium Wound Laterality: Left Cleanser: Soap and Water 1 x Per Day/30 Days Discharge Instructions: May shower and wash wound with dial antibacterial soap and water prior to dressing change. Cleanser: Wound Cleanser 1 x Per Day/30 Days Discharge Instructions: Cleanse the wound with wound cleanser prior to applying a clean dressing using gauze sponges, not tissue or cotton balls. Prim Dressing: Santyl Ointment 1 x Per Day/30 Days ary Discharge Instructions: Apply nickel thick amount to wound bed as instructed Secondary Dressing: Zetuvit Plus Silicone Border Dressing 4x4 (in/in) 1 x Per Day/30 Days Discharge Instructions: Apply silicone border over primary dressing as directed. 1. Would recommend currently that we going to continue with the wound care measures as before and the patient is in agreement with that plan. This includes the use of the wound VAC which I think would do a good job. I am also can recommend Santyl be utilized for the left ischial location. For the other locations with him using silver collagen which I think is doing a great job as well. 2. We will going continue as well with appropriate offloading I think that still of utmost importance. 3. I am also going to suggest for the patient continue with monitoring for any signs of worsening or infection he knows what to look for let me know if anything changes. We will see patient back for reevaluation in 1 week here in the clinic. If anything worsens or changes patient will contact our office for additional recommendations. Electronic Signature(s) Signed: 09/28/2021 5:53:47 PM By: [name] Keeler PA-C Entered By: [name] on 09/28/2021 17:53:47 -------------------------------------------------------------------------------- SuperBill Details Patient Name: Date of Service: Veronia Beets 09/28/2021 Medical Record Number: [id number] Patient Account Number:  [id number] Date of Birth/Sex: Treating RN: 06/05/1991 (30 y.o. [name] Primary Care Provider: PCP, NO Other Clinician: Referring Provider: Treating Provider/Extender: [name] in Treatment: 60 Diagnosis Coding ICD-10 Codes Code Description L89.314 Pressure ulcer of right buttock, stage 4 L89.324 Pressure ulcer of left buttock, stage 4 L89.223 Pressure ulcer of left hip, stage 3 E43 Unspecified severe protein-calorie malnutrition G82.21 Paraplegia, complete M86.68 Other chronic osteomyelitis, other site Facility Procedures CPT4 Code: [id number] Description: 7602 - DEBRIDE W/O ANES NON SELECT Modifier: 77 Quantity: 1 CPT4 Code: [id number] Description: 7605 - WOUND VAC-50 SQ CM OR LESS Modifier: 44 Quantity: 1 Physician Procedures : CPT4 Code Description Modifier [id number] - WC PHYS LEVEL 4 - EST PT 25 ICD-10 Diagnosis Description L89.314 Pressure ulcer of right buttock, stage 4 L89.324 Pressure ulcer of left buttock, stage 4 L89.223 Pressure ulcer of left hip, stage 3 E43  Unspecified severe protein-calorie malnutrition Quantity: 1 : [id number] - WC PHYS CHEM CAUT GRAN TISSUE ICD-10 Diagnosis Description L89.223 Pressure ulcer of left hip, stage 3 Quantity: 1 Electronic Signature(s) Signed: 09/28/2021 5:54:21 PM By: [name] Keeler PA-C Entered By: [name] on 09/28/2021 17:54:20

## 2021-09-29 NOTE — Progress Notes (Signed)
[name] ([id number]) Visit Report for 09/28/2021 Arrival Information Details Patient Name: Date of Service: [name], [name] 09/28/2021 2:30 PM Medical Record Number: [id number] Patient Account Number: [id number] Date of Birth/Sex: Treating RN: 1991-11-03 (30 y.o. [name] Primary Care [name]: PCP, NO Other Clinician: Referring [name]: Treating [name]/Extender: [name] in Treatment: 60 Visit Information History Since Last Visit Added or deleted any medications: No Patient Arrived: Wheel Chair Any new allergies or adverse reactions: No Arrival Time: 15:06 Had a fall or experienced change in No Accompanied By: self activities of daily living that may affect Transfer Assistance: None risk of falls: Patient Identification Verified: Yes Signs or symptoms of abuse/neglect since last visito No Secondary Verification Process Completed: Yes Hospitalized since last visit: No Patient Requires Transmission-Based Precautions: No Implantable device outside of the clinic excluding No Patient Has Alerts: No cellular tissue based products placed in the center since last visit: Has Dressing in Place as Prescribed: Yes Pain Present Now: No Electronic Signature(s) Signed: 09/29/2021 10:21:29 AM By: [name] Entered By: [name] on 09/28/2021 15:07:44 -------------------------------------------------------------------------------- Encounter Discharge Information Details Patient Name: Date of Service: [name] March L. 09/28/2021 2:30 PM Medical Record Number: [id number] Patient Account Number: [id number] Date of Birth/Sex: Treating RN: [birth year] (30 y.o. [name] Primary Care [name]: PCP, NO Other Clinician: Referring [name]: Treating [name]/Extender: [name] in Treatment: 60 Encounter Discharge Information Items Discharge Condition: Stable Ambulatory Status:  Wheelchair Discharge Destination: Home Transportation: Private Auto Accompanied By: self Schedule Follow-up Appointment: Yes Clinical Summary of Care: Electronic Signature(s) Signed: 09/29/2021 6:11:26 PM By: [name] Stall RN, BSN Entered By: [name] on 09/28/2021 16:13:25 -------------------------------------------------------------------------------- Lower Extremity Assessment Details Patient Name: Date of Service: [name], [name] L. 09/28/2021 2:30 PM Medical Record Number: [id number] Patient Account Number: [id number] Date of Birth/Sex: Treating RN: 1991/07/14 (30 y.o. [name] Primary Care [name]: PCP, NO Other Clinician: Referring Jashira Cotugno: Treating [name]/Extender: [name] in Treatment: 60 Edema Assessment Assessed: [Left: Yes] [Right: No] Edema: [Left: N] [Right: o] Calf Left: Right: Point of Measurement: From Medial Instep 29 cm Ankle Left: Right: Point of Measurement: From Medial Instep 21 cm Vascular Assessment Pulses: Dorsalis Pedis Palpable: [Left:Yes] Electronic Signature(s) Signed: 09/29/2021 6:11:26 PM By: [name] Stall RN, BSN Entered By: [name] on 09/28/2021 15:35:25 -------------------------------------------------------------------------------- Multi-Disciplinary Care Plan Details Patient Name: Date of Service: [name] March L. 09/28/2021 2:30 PM Medical Record Number: [id number] Patient Account Number: [id number] Date of Birth/Sex: Treating RN: November 23, 1991 (30 y.o. [name] Primary Care [name]: PCP, NO Other Clinician: Referring [name]: Treating [name]/Extender: [name] in Treatment: 60 Multidisciplinary Care Plan reviewed with physician Active Inactive Pressure Nursing Diagnoses: Knowledge deficit related to causes and risk factors for pressure ulcer development Goals: Patient/caregiver will verbalize risk factors for pressure ulcer  development Date Initiated: 07/30/2020 Target Resolution Date: 10/19/2021 Goal Status: Active Interventions: Provide education on pressure ulcers Notes: 06/08/21: Patient recently had new pressure areas, target date extended. Wound/Skin Impairment Nursing Diagnoses: Impaired tissue integrity Goals: Patient/caregiver will verbalize understanding of skin care regimen Date Initiated: 01/05/2021 Target Resolution Date: 10/19/2021 Goal Status: Active Ulcer/skin breakdown will have a volume reduction of 50% by week 8 Date Initiated: 07/30/2020 Date Inactivated: 10/20/2020 Target Resolution Date: 09/27/2020 Goal Status: Unmet Unmet Reason: Infection Ulcer/skin breakdown will have a volume reduction of 80% by week 12 Date Initiated: 10/20/2020 Date Inactivated: 12/22/2020  Target Resolution Date: 11/19/2020 Unmet Reason: paraplegic, difficulty Goal Status: Unmet offloading Interventions: Provide education on ulcer and skin care Notes: 06/08/21: wound care ongoing. Electronic Signature(s) Signed: 09/29/2021 6:11:26 PM By: [name] Stall RN, BSN Entered By: [name] on 09/28/2021 15:40:14 -------------------------------------------------------------------------------- Negative Pressure Wound Therapy Application (NPWT) Details Patient Name: Date of Service: [name] 09/28/2021 2:30 PM Medical Record Number: [id number] Patient Account Number: [id number] Date of Birth/Sex: Treating RN: 1991/07/16 (30 y.o. [name] Primary Care [name]: PCP, NO Other Clinician: Referring [name]: Treating [name]/Extender: [name] in Treatment: 60 NPWT Application Performed for: Wound #6 Right Ischium Additional Injuries Covered: No Performed By: [name] Stall, RN Type: VAC System Coverage Size (sq cm): 13 Pressure Type: Constant Pressure Setting: 125 mmHG Drain Type: None Primary Contact: Non-Adherent Quantity of Sponges/Gauze Inserted: x1 black  foam bridge to right hip area. Sponge/Dressing Type: Foam, Black Date Initiated: 09/28/2021 Response to Treatment: tolerated well. Post Procedure Diagnosis Same as Pre-procedure Electronic Signature(s) Signed: 09/29/2021 6:11:26 PM By: [name] Stall RN, BSN Entered By: [name] on 09/28/2021 15:59:58 -------------------------------------------------------------------------------- Pain Assessment Details Patient Name: Date of Service: HARROLD, FITCHETT L. 09/28/2021 2:30 PM Medical Record Number: [id number] Patient Account Number: [id number] Date of Birth/Sex: Treating RN: 11/28/1990 (30 y.o. [name] Primary Care Mirca Yale: PCP, NO Other Clinician: Referring Matasha Smigelski: Treating Derin Granquist/Extender: [name] in Treatment: 60 Active Problems Location of Pain Severity and Description of Pain Patient Has Paino No Site Locations Pain Management and Medication Current Pain Management: Electronic Signature(s) Signed: 09/28/2021 5:50:27 PM By: Zenaida Deed RN, BSN Signed: 09/29/2021 10:21:29 AM By: [name] Entered By: [name] on 09/28/2021 15:08:32 -------------------------------------------------------------------------------- Patient/Caregiver Education Details Patient Name: Date of Service: [name] 11/2/2022andnbsp2:30 PM Medical Record Number: [id number] Patient Account Number: [id number] Date of Birth/Gender: Treating RN: 1991-05-18 (30 y.o. [name] Primary Care Physician: PCP, NO Other Clinician: Referring Physician: Treating Physician/Extender: [name] in Treatment: 60 Education Assessment Education Provided To: Patient Education Topics Provided Pressure: Handouts: Pressure Ulcers: Care and Offloading, Pressure Ulcers: Care and Offloading 2 Methods: Explain/Verbal Responses: Reinforcements needed Electronic Signature(s) Signed: 09/29/2021 6:11:26 PM By:  [name] Stall RN, BSN Signed: 09/29/2021 6:11:26 PM By: [name] Stall RN, BSN Entered By: [name] on 09/28/2021 15:40:58 -------------------------------------------------------------------------------- Wound Assessment Details Patient Name: Date of Service: [name] March L. 09/28/2021 2:30 PM Medical Record Number: [id number] Patient Account Number: [id number] Date of Birth/Sex: Treating RN: 27-Dec-1990 (30 y.o. [name] Primary Care Wynee Matarazzo: PCP, NO Other Clinician: Referring Anikka Marsan: Treating Clarabelle Oscarson/Extender: [name] in Treatment: 60 Wound Status Wound Number: 10 Primary Etiology: Pressure Ulcer Wound Location: Left, Lateral Ankle Wound Status: Open Wounding Event: Pressure Injury Comorbid History: Paraplegia Date Acquired: 08/13/2021 [name] Of Treatment: 5 Clustered Wound: No Photos Wound Measurements Length: (cm) 0.4 Width: (cm) 0.2 Depth: (cm) 0.3 Area: (cm) 0.063 Volume: (cm) 0.019 % Reduction in Area: 98.1% % Reduction in Volume: 97.1% Epithelialization: Medium (34-66%) Tunneling: No Wound Description Classification: Category/Stage III Wound Margin: Distinct, outline attached Exudate Amount: Medium Exudate Type: Serosanguineous Exudate Color: red, brown Foul Odor After Cleansing: No Slough/Fibrino No Wound Bed Granulation Amount: Large (67-100%) Exposed Structure Granulation Quality: Red, Pink Fascia Exposed: No Necrotic Amount: None Present (0%) Fat Layer (Subcutaneous Tissue) Exposed: Yes Tendon Exposed: No Muscle Exposed: No Joint Exposed: No Bone Exposed: No Treatment Notes Wound #10 (Ankle) Wound Laterality: Left,  Lateral Cleanser Soap and Water Discharge Instruction: May shower and wash wound with dial antibacterial soap and water prior to dressing change. Peri-Wound Care Topical Primary Dressing Promogran Prisma Matrix, 4.34 (sq in) (silver collagen) Discharge Instruction: Moisten collagen  with saline or hydrogel Secondary Dressing Zetuvit Plus Silicone Border Dressing 4x4 (in/in) Discharge Instruction: Apply silicone border over primary dressing as directed. Secured With Compression Wrap Compression Stockings Facilities manager) Signed: 09/28/2021 5:50:27 PM By: Zenaida Deed RN, BSN Signed: 09/29/2021 6:11:26 PM By: [name] Stall RN, BSN Entered By: [name] on 09/28/2021 15:37:51 -------------------------------------------------------------------------------- Wound Assessment Details Patient Name: Date of Service: [name], [name] NIO L. 09/28/2021 2:30 PM Medical Record Number: [id number] Patient Account Number: [id number] Date of Birth/Sex: Treating RN: 04-01-1991 (30 y.o. [name] Primary Care Toneka Fullen: PCP, NO Other Clinician: Referring Cristal Howatt: Treating September Mormile/Extender: [name] in Treatment: 60 Wound Status Wound Number: 11 Primary Etiology: Pressure Ulcer Wound Location: Sacrum Wound Status: Open Wounding Event: Pressure Injury Comorbid History: Paraplegia Date Acquired: 09/07/2021 [name] Of Treatment: 3 Clustered Wound: No Photos Wound Measurements Length: (cm) 0.3 Width: (cm) 1 Depth: (cm) 0.1 Area: (cm) 0.236 Volume: (cm) 0.024 % Reduction in Area: 78.5% % Reduction in Volume: 78.2% Epithelialization: Medium (34-66%) Tunneling: No Undermining: No Wound Description Classification: Category/Stage III Wound Margin: Flat and Intact Exudate Amount: Medium Exudate Type: Serosanguineous Exudate Color: red, brown Wound Bed Granulation Amount: Large (67-100%) Granulation Quality: Pink, Pale Necrotic Amount: None Present (0%) Foul Odor After Cleansing: No Slough/Fibrino No Exposed Structure Fascia Exposed: No Fat Layer (Subcutaneous Tissue) Exposed: Yes Tendon Exposed: No Muscle Exposed: No Joint Exposed: No Bone Exposed: No Treatment Notes Wound #11 (Sacrum) Cleanser Soap  and Water Discharge Instruction: May shower and wash wound with dial antibacterial soap and water prior to dressing change. Wound Cleanser Discharge Instruction: Cleanse the wound with wound cleanser prior to applying a clean dressing using gauze sponges, not tissue or cotton balls. Peri-Wound Care Zinc Oxide Ointment 30g tube Discharge Instruction: Apply Zinc Oxide to periwound with each dressing change Topical Primary Dressing Promogran Prisma Matrix, 4.34 (sq in) (silver collagen) Discharge Instruction: Moisten collagen with saline or hydrogel Secondary Dressing Woven Gauze Sponge, Non-Sterile 4x4 in Discharge Instruction: Apply over primary dressing as directed. ABD Pad, 5x9 Discharge Instruction: Apply over primary dressing as directed. Secured With 5M Medipore H Soft Cloth Surgical T 4 x 2 (in/yd) ape Discharge Instruction: Secure dressing with tape as directed. Compression Wrap Compression Stockings Add-Ons Electronic Signature(s) Signed: 09/28/2021 5:50:27 PM By: Zenaida Deed RN, BSN Signed: 09/29/2021 6:11:26 PM By: [name] Stall RN, BSN Entered By: [name] on 09/28/2021 15:38:31 -------------------------------------------------------------------------------- Wound Assessment Details Patient Name: Date of Service: [name], [name] NIO L. 09/28/2021 2:30 PM Medical Record Number: [id number] Patient Account Number: [id number] Date of Birth/Sex: Treating RN: Apr 04, 1991 (30 y.o. [name] Primary Care Jabe Jeanbaptiste: PCP, NO Other Clinician: Referring Marayah Higdon: Treating Emmaleah Meroney/Extender: [name] in Treatment: 60 Wound Status Wound Number: 6 Primary Etiology: Pressure Ulcer Wound Location: Right Ischium Wound Status: Open Wounding Event: Trauma Comorbid History: Paraplegia Date Acquired: 02/10/2021 [name] Of Treatment: 32 Clustered Wound: No Photos Wound Measurements Length: (cm) 5.2 Width: (cm) 2.5 Depth: (cm) 1.9 Area:  (cm) 10.21 Volume: (cm) 19.399 % Reduction in Area: 33.3% % Reduction in Volume: -1166.3% Epithelialization: None Tunneling: No Undermining: Yes Starting Position (o'clock): 9 Ending Position (o'clock): 1 Maximum Distance: (cm) 2 Wound Description Classification: Category/Stage IV Wound Margin: Well defined, not attached  Exudate Amount: Medium Exudate Type: Serous Exudate Color: amber Foul Odor After Cleansing: No Slough/Fibrino Yes Wound Bed Granulation Amount: Large (67-100%) Exposed Structure Granulation Quality: Red, Pink Fascia Exposed: No Necrotic Amount: Small (1-33%) Fat Layer (Subcutaneous Tissue) Exposed: Yes Necrotic Quality: Adherent Slough Tendon Exposed: No Muscle Exposed: No Joint Exposed: No Bone Exposed: No Treatment Notes Wound #6 (Ischium) Wound Laterality: Right Cleanser Soap and Water Discharge Instruction: May shower and wash wound with dial antibacterial soap and water prior to dressing change. Wound Cleanser Discharge Instruction: Cleanse the wound with wound cleanser prior to applying a clean dressing using gauze sponges, not tissue or cotton balls. Peri-Wound Care Topical Primary Dressing wound vac continuous negative pressure Discharge Instruction: black foam Secondary Dressing Secured With Compression Wrap Compression Stockings Add-Ons Electronic Signature(s) Signed: 09/28/2021 5:50:27 PM By: Zenaida Deed RN, BSN Signed: 09/29/2021 6:11:26 PM By: [name] Stall RN, BSN Entered By: [name] on 09/28/2021 15:37:19 -------------------------------------------------------------------------------- Wound Assessment Details Patient Name: Date of Service: [name], [name] NIO L. 09/28/2021 2:30 PM Medical Record Number: [id number] Patient Account Number: [id number] Date of Birth/Sex: Treating RN: 1991-08-07 (30 y.o. [name] Primary Care Shimon Trowbridge: PCP, NO Other Clinician: Referring Jago Carton: Treating  Elexus Barman/Extender: [name] in Treatment: 60 Wound Status Wound Number: 8 Primary Etiology: Pressure Ulcer Wound Location: Left Trochanter Wound Status: Open Wounding Event: Pressure Injury Comorbid History: Paraplegia Date Acquired: 05/09/2021 [name] Of Treatment: 19 Clustered Wound: No Photos Wound Measurements Length: (cm) 0.7 Width: (cm) 1.1 Depth: (cm) 0.1 Area: (cm) 0.605 Volume: (cm) 0.06 % Reduction in Area: -57.1% % Reduction in Volume: -57.9% Epithelialization: Small (1-33%) Tunneling: No Undermining: No Wound Description Classification: Category/Stage IV Wound Margin: Distinct, outline attached Exudate Amount: Medium Exudate Type: Serous Exudate Color: amber Foul Odor After Cleansing: No Slough/Fibrino No Wound Bed Granulation Amount: Large (67-100%) Exposed Structure Granulation Quality: Red Fascia Exposed: No Necrotic Amount: None Present (0%) Fat Layer (Subcutaneous Tissue) Exposed: Yes Tendon Exposed: No Muscle Exposed: No Joint Exposed: No Bone Exposed: No Treatment Notes Wound #8 (Trochanter) Wound Laterality: Left Cleanser Soap and Water Discharge Instruction: May shower and wash wound with dial antibacterial soap and water prior to dressing change. Wound Cleanser Discharge Instruction: Cleanse the wound with wound cleanser prior to applying a clean dressing using gauze sponges, not tissue or cotton balls. Peri-Wound Care Topical Primary Dressing Promogran Prisma Matrix, 4.34 (sq in) (silver collagen) Discharge Instruction: Moisten collagen with saline or hydrogel Secondary Dressing Zetuvit Plus Silicone Border Dressing 4x4 (in/in) Discharge Instruction: Apply silicone border over primary dressing as directed. Secured With Compression Wrap Compression Stockings Facilities manager) Signed: 09/28/2021 5:50:27 PM By: Zenaida Deed RN, BSN Signed: 09/29/2021 6:11:26 PM By: [name] Stall RN,  BSN Entered By: [name] on 09/28/2021 15:39:12 -------------------------------------------------------------------------------- Wound Assessment Details Patient Name: Date of Service: [name], [name] NIO L. 09/28/2021 2:30 PM Medical Record Number: [id number] Patient Account Number: [id number] Date of Birth/Sex: Treating RN: 1991/05/08 (30 y.o. [name] Primary Care Antuane Eastridge: PCP, NO Other Clinician: Referring Yusif Gnau: Treating Jenniefer Salak/Extender: [name] in Treatment: 60 Wound Status Wound Number: 9 Primary Etiology: Pressure Ulcer Wound Location: Left Ischium Wound Status: Open Wounding Event: Pressure Injury Comorbid History: Paraplegia Date Acquired: 05/09/2021 [name] Of Treatment: 19 Clustered Wound: No Photos Wound Measurements Length: (cm) 2.2 Width: (cm) 2.5 Depth: (cm) 0.3 Area: (cm) 4.32 Volume: (cm) 1.296 % Reduction in Area: -90.3% % Reduction in Volume: -470.9% Epithelialization: Small (1-33%) Tunneling: No Undermining:  No Wound Description Classification: Category/Stage III Wound Margin: Distinct, outline attached Exudate Amount: Medium Exudate Type: Serous Exudate Color: amber Foul Odor After Cleansing: No Slough/Fibrino Yes Wound Bed Granulation Amount: Medium (34-66%) Exposed Structure Granulation Quality: Red, Pink Fascia Exposed: No Necrotic Amount: Medium (34-66%) Fat Layer (Subcutaneous Tissue) Exposed: Yes Necrotic Quality: Adherent Slough Tendon Exposed: No Muscle Exposed: No Joint Exposed: No Bone Exposed: No Treatment Notes Wound #9 (Ischium) Wound Laterality: Left Cleanser Soap and Water Discharge Instruction: May shower and wash wound with dial antibacterial soap and water prior to dressing change. Wound Cleanser Discharge Instruction: Cleanse the wound with wound cleanser prior to applying a clean dressing using gauze sponges, not tissue or cotton balls. Peri-Wound  Care Topical Primary Dressing Santyl Ointment Discharge Instruction: Apply nickel thick amount to wound bed as instructed Secondary Dressing Zetuvit Plus Silicone Border Dressing 4x4 (in/in) Discharge Instruction: Apply silicone border over primary dressing as directed. Secured With Compression Wrap Compression Stockings Facilities manager) Signed: 09/28/2021 5:50:27 PM By: Zenaida Deed RN, BSN Signed: 09/29/2021 6:11:26 PM By: [name] Stall RN, BSN Entered By: [name] on 09/28/2021 15:39:41 -------------------------------------------------------------------------------- Vitals Details Patient Name: Date of Service: [name], [name] NIO L. 09/28/2021 2:30 PM Medical Record Number: [id number] Patient Account Number: [id number] Date of Birth/Sex: Treating RN: 04-11-1991 (30 y.o. [name] Primary Care Ronda Kazmi: PCP, NO Other Clinician: Referring Alaney [name]: Treating Zarion Oliff/Extender: [name] in Treatment: 60 Vital Signs Time Taken: 15:07 Temperature (F): 98.8 Height (in): 71 Pulse (bpm): 93 Weight (lbs): 136 Respiratory Rate (breaths/min): 18 Body Mass Index (BMI): 19 Blood Pressure (mmHg): 131/88 Reference Range: 80 - 120 mg / dl Electronic Signature(s) Signed: 09/29/2021 10:21:29 AM By: [name] Signed: 09/29/2021 10:21:29 AM By: [name] Entered By: [name] on 09/28/2021 15:08:05

## 2021-10-03 ENCOUNTER — Other Ambulatory Visit: Payer: Self-pay

## 2021-10-03 ENCOUNTER — Other Ambulatory Visit: Payer: Self-pay | Admitting: Physical Medicine and Rehabilitation

## 2021-10-03 ENCOUNTER — Encounter (HOSPITAL_BASED_OUTPATIENT_CLINIC_OR_DEPARTMENT_OTHER): Payer: Medicaid Other | Admitting: Internal Medicine

## 2021-10-03 DIAGNOSIS — L89159 Pressure ulcer of sacral region, unspecified stage: Secondary | ICD-10-CM | POA: Diagnosis not present

## 2021-10-03 MED ORDER — OXYCODONE HCL 10 MG PO TABS: 10.0000 mg | ORAL_TABLET | Freq: Four times a day (QID) | ORAL | 0 refills | Status: DC | PRN

## 2021-10-03 NOTE — Telephone Encounter (Signed)
Refill for tramadol sent from pharmacy

## 2021-10-04 ENCOUNTER — Telehealth: Payer: Self-pay

## 2021-10-04 NOTE — Progress Notes (Signed)
KAINALU, HEGGS ([id number]) Visit Report for 10/03/2021 Arrival Information Details Patient Name: Date of Service: [name], [name] 10/03/2021 2:00 PM Medical Record Number: [id number] Patient Account Number: [id number] Date of Birth/Sex: Treating RN: Mar 08, 1991 (30 y.o. [name] Primary Care Amillion Scobee: PCP, NO Other Clinician: Referring Clifton Kovacic: Treating Vlada Uriostegui/Extender: Malachi Pro in Treatment: 23 Visit Information History Since Last Visit Added or deleted any medications: No Patient Arrived: Wheel Chair Any new allergies or adverse reactions: No Arrival Time: 14:21 Had a fall or experienced change in No Accompanied By: self activities of daily living that may affect Transfer Assistance: None risk of falls: Patient Identification Verified: Yes Signs or symptoms of abuse/neglect since last visito No Secondary Verification Process Completed: Yes Hospitalized since last visit: No Patient Requires Transmission-Based Precautions: No Implantable device outside of the clinic excluding No Patient Has Alerts: No cellular tissue based products placed in the center since last visit: Has Dressing in Place as Prescribed: Yes Pain Present Now: No Notes pt removed VAC Saturday since seal did not hold Electronic Signature(s) Signed: 10/04/2021 5:12:04 PM By: Zenaida Deed RN, BSN Entered By: Zenaida Deed on 10/03/2021 14:22:28 -------------------------------------------------------------------------------- Encounter Discharge Information Details Patient Name: Date of Service: [name] NTO NIO L. 10/03/2021 2:00 PM Medical Record Number: [id number] Patient Account Number: [id number] Date of Birth/Sex: Treating RN: Feb 08, 1991 (30 y.o. [name] Primary Care Evon Lopezperez: PCP, NO Other Clinician: Referring Jocilyn Trego: Treating Akyah Lagrange/Extender: Malachi Pro in Treatment: 32 Encounter Discharge Information  Items Discharge Condition: Stable Ambulatory Status: Wheelchair Discharge Destination: Home Transportation: Private Auto Accompanied By: self Schedule Follow-up Appointment: Yes Clinical Summary of Care: Patient Declined Electronic Signature(s) Signed: 10/04/2021 5:12:04 PM By: Zenaida Deed RN, BSN Entered By: Zenaida Deed on 10/03/2021 15:20:19 -------------------------------------------------------------------------------- Negative Pressure Wound Therapy Maintenance (NPWT) Details Patient Name: Date of Service: [name], [name] NIO L. 10/03/2021 2:00 PM Medical Record Number: [id number] Patient Account Number: [id number] Date of Birth/Sex: Treating RN: [birth year] (30 y.o. [name] Primary Care Aveon Colquhoun: PCP, NO Other Clinician: Referring Sequoya Hogsett: Treating Charrisse Masley/Extender: Malachi Pro in Treatment: 61 NPWT Maintenance Performed for: Wound #6 Right Ischium Performed By: Zenaida Deed, RN Type: VAC System Coverage Size (sq cm): 13 Pressure Type: Constant Pressure Setting: 125 mmHG Drain Type: None Sponge/Dressing Type: Foam, Black Date Initiated: 09/28/2021 Dressing Removed: No Quantity of Sponges/Gauze Removed: pt removed at home Canister Changed: No Dressing Reapplied: No Quantity of Sponges/Gauze Inserted: 1 Respones T Treatment: o good Days On NPWT : 6 Electronic Signature(s) Signed: 10/04/2021 5:12:04 PM By: Zenaida Deed RN, BSN Entered By: Zenaida Deed on 10/03/2021 15:17:34 -------------------------------------------------------------------------------- Patient/Caregiver Education Details Patient Name: Date of Service: [name] 11/7/2022andnbsp2:00 PM Medical Record Number: [id number] Patient Account Number: [id number] Date of Birth/Gender: Treating RN: [birth year] (30 y.o. [name] Primary Care Physician: PCP, NO Other Clinician: Referring Physician: Treating Physician/Extender: Malachi Pro in Treatment: 53 Education Assessment Education Provided To: Patient Education Topics Provided Pressure: Methods: Explain/Verbal Responses: Reinforcements needed, State content correctly Wound/Skin Impairment: Methods: Explain/Verbal Responses: Reinforcements needed, State content correctly Electronic Signature(s) Signed: 10/04/2021 5:12:04 PM By: Zenaida Deed RN, BSN Entered By: Zenaida Deed on 10/03/2021 15:20:02 -------------------------------------------------------------------------------- Wound Assessment Details Patient Name: Date of Service: [name] NIO L. 10/03/2021 2:00 PM Medical Record Number: [id number] Patient Account Number: [id number] Date of Birth/Sex: Treating RN: 1991/03/15 (30 y.o. [name] Primary Care Erum Cercone: PCP, NO Other Clinician: Referring Ellenie Salome: Treating  Shalana Jardin/Extender: Lynne Logan Weeks in Treatment: 61 Wound Status Wound Number: 6 Primary Etiology: Pressure Ulcer Wound Location: Right Ischium Wound Status: Open Wounding Event: Trauma Comorbid History: Paraplegia Date Acquired: 02/10/2021 Weeks Of Treatment: 32 Clustered Wound: No Wound Measurements Length: (cm) 5.2 Width: (cm) 2.5 Depth: (cm) 1.9 Area: (cm) 10.21 Volume: (cm) 19.399 % Reduction in Area: 33.3% % Reduction in Volume: -1166.3% Epithelialization: None Wound Description Classification: Category/Stage IV Wound Margin: Well defined, not attached Exudate Amount: Medium Exudate Type: Serous Exudate Color: amber Foul Odor After Cleansing: No Slough/Fibrino Yes Wound Bed Granulation Amount: Large (67-100%) Exposed Structure Granulation Quality: Red, Pink Fascia Exposed: No Necrotic Amount: Small (1-33%) Fat Layer (Subcutaneous Tissue) Exposed: Yes Necrotic Quality: Adherent Slough Tendon Exposed: No Muscle Exposed: No Joint Exposed: No Bone Exposed: No Treatment Notes Wound #6 (Ischium)  Wound Laterality: Right Cleanser Soap and Water Discharge Instruction: May shower and wash wound with dial antibacterial soap and water prior to dressing change. Wound Cleanser Discharge Instruction: Cleanse the wound with wound cleanser prior to applying a clean dressing using gauze sponges, not tissue or cotton balls. Peri-Wound Care Topical Primary Dressing wound vac continuous negative pressure Discharge Instruction: black foam Secondary Dressing Secured With Compression Wrap Compression Stockings Add-Ons Electronic Signature(s) Signed: 10/04/2021 5:12:04 PM By: Zenaida Deed RN, BSN Entered By: Zenaida Deed on 10/03/2021 15:16:43 -------------------------------------------------------------------------------- Vitals Details Patient Name: Date of Service: [name] NTO NIO L. 10/03/2021 2:00 PM Medical Record Number: [id number] Patient Account Number: [id number] Date of Birth/Sex: Treating RN: [birth year] (30 y.o. [name] Primary Care Juell Radney: PCP, NO Other Clinician: Referring Daiana Vitiello: Treating Burnett Spray/Extender: Malachi Pro in Treatment: 61 Vital Signs Time Taken: 14:22 Temperature (F): 98.7 Height (in): 71 Pulse (bpm): 73 Source: Stated Respiratory Rate (breaths/min): 18 Weight (lbs): 136 Blood Pressure (mmHg): 144/93 Source: Stated Reference Range: 80 - 120 mg / dl Body Mass Index (BMI): 19 Electronic Signature(s) Signed: 10/04/2021 5:12:04 PM By: Zenaida Deed RN, BSN Entered By: Zenaida Deed on 10/03/2021 15:12:32

## 2021-10-04 NOTE — Telephone Encounter (Signed)
Oxycodone HCL PA put on Dr. Dahlia Client desk for signature

## 2021-10-04 NOTE — Progress Notes (Signed)
SWAN, ZAYED ([id number]) Visit Report for 10/03/2021 SuperBill Details Patient Name: Date of Service: RMANI, KELLOGG 10/03/2021 Medical Record Number: [id number] Patient Account Number: [id number] Date of Birth/Sex: Treating RN: [birth year] (30 y.o. 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

## 2021-10-06 ENCOUNTER — Encounter (HOSPITAL_BASED_OUTPATIENT_CLINIC_OR_DEPARTMENT_OTHER): Payer: Medicaid Other | Admitting: Internal Medicine

## 2021-10-06 ENCOUNTER — Other Ambulatory Visit: Payer: Self-pay

## 2021-10-06 DIAGNOSIS — L89159 Pressure ulcer of sacral region, unspecified stage: Secondary | ICD-10-CM | POA: Diagnosis not present

## 2021-10-06 NOTE — Progress Notes (Signed)
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 attached to the wound base. There is large (67-100%) red, pink granulation within the wound bed. There is a small (1-33%) amount of necrotic tissue within the wound bed including Adherent Slough. Wound #8  status is Open. Original cause of wound was Pressure Injury. The date acquired was: 05/09/2021. The wound has been in treatment 20 weeks. The wound is located on the Left Trochanter. The wound measures 0.3cm length x 0.4cm width x 0.1cm depth; 0.094cm^2 area and 0.009cm^3 volume. There is Fat Layer (Subcutaneous Tissue) exposed. There is no tunneling or undermining noted. There is a medium amount of serous drainage noted. The wound margin is distinct with the outline attached to the wound base. There is large (67-100%) red, pink granulation within the wound bed. There is no necrotic tissue within the wound bed. Wound #9 status is Open. Original cause of wound was Pressure Injury. The date acquired was: 05/09/2021. The wound has been in treatment 20 weeks. The wound is located on the Left Ischium. The wound measures 2.2cm length x 2.3cm width x 0.4cm depth; 3.974cm^2 area and 1.59cm^3 volume. There is Fat Layer (Subcutaneous Tissue) exposed. There is no tunneling or undermining noted. There is a medium amount  of serous drainage noted. The wound margin is distinct with the outline attached to the wound base. There is medium (34-66%) red, pink granulation within the wound bed. There is a medium (34-66%) amount of necrotic tissue within the wound bed including Adherent Slough. Assessment Active Problems ICD-10 Pressure ulcer of right buttock, stage 4 Pressure ulcer of left buttock, stage 4 Pressure ulcer of left hip, stage 3 Unspecified severe protein-calorie malnutrition Paraplegia, complete Other chronic osteomyelitis, other site Procedures Wound #9 Pre-procedure diagnosis of Wound #9 is a Pressure Ulcer located on the Left Ischium . There was a Excisional Skin/Subcutaneous Tissue Debridement with a total area of 5.06 sq cm performed by Ricard Dillon., MD. With the following instrument(s): Curette to remove Non-Viable tissue/material. Material removed includes Subcutaneous Tissue and Slough  and. No specimens were taken. A time out was conducted at 15:29, prior to the start of the procedure. A Minimum amount of bleeding was controlled with Pressure. The procedure was tolerated well. Post Debridement Measurements: 2.2cm length x 2.3cm width x 0.4cm depth; 1.59cm^3 volume. Post debridement Stage noted as Category/Stage III. Character of Wound/Ulcer Post Debridement is stable. Post procedure Diagnosis Wound #9: Same as Pre-Procedure Wound #8 Pre-procedure diagnosis of Wound #8 is a Pressure Ulcer located on the Left Trochanter . An Chemical Cauterization procedure was performed by Ricard Dillon., MD. Post procedure Diagnosis Wound #8: Same as Pre-Procedure Plan Follow-up Appointments: Return Appointment in 1 week. - Thursdays Dr. Heber Ramah or Dr. Dellia Nims Nurse Visit: - Monday wound vac change. Bathing/ Shower/ Hygiene: May shower with protection but do not get wound dressing(s) wet. Negative Presssure Wound Therapy: Wound #6 Right Ischium: Wound Vac to wound continuously at 113mm/hg pressure - apply to right ischium change twice a week. Black Foam Off-Loading: Turn and reposition every 2 hours - be sure to lift up off the chair with arms every hour while in wheelchair Other: - pillows under left calf to keep pressure of left lateral ankle Non Wound Condition: Protect area with: - Protect sacrum with vaseline or zinc oxide WOUND #10: - Ankle Wound Laterality: Left, Lateral Cleanser: Soap and Water Every Other Day/30 Days Discharge Instructions: May shower and wash wound with dial antibacterial soap and water prior to dressing change. Prim Dressing: Promogran Prisma Matrix, 4.34 (sq in) (silver collagen) Every Other Day/30 Days ary Discharge Instructions: Moisten collagen with saline or hydrogel Secondary Dressing: Zetuvit Plus Silicone Border Dressing 4x4 (in/in) Every Other Day/30 Days Discharge Instructions: Apply silicone border over primary dressing as directed. WOUND  #11: - Sacrum Wound Laterality: Cleanser: Soap and Water 1 x Per Day/30 Days Discharge Instructions: May shower and wash wound with dial antibacterial soap and water prior to dressing change. Cleanser: Wound Cleanser 1 x Per Day/30 Days Discharge Instructions: Cleanse the wound with wound cleanser prior to applying a clean dressing using gauze sponges, not tissue or cotton balls. Peri-Wound Care: Zinc Oxide Ointment 30g tube 1 x Per Day/30 Days Discharge Instructions: Apply Zinc Oxide to periwound with each dressing change Prim Dressing: Promogran Prisma Matrix, 4.34 (sq in) (silver collagen) 1 x Per Day/30 Days ary Discharge Instructions: Moisten collagen with saline or hydrogel Secondary Dressing: Woven Gauze Sponge, Non-Sterile 4x4 in 1 x Per Day/30 Days Discharge Instructions: Apply over primary dressing as directed. Secondary Dressing: ABD Pad, 5x9 1 x Per Day/30 Days Discharge Instructions: Apply over primary dressing as directed. Secured With: 70M Medipore H Soft Cloth Surgical T 4 x 2 (in/yd) 1 x Per Day/30 Days ape  Discharge Instructions: Secure dressing with tape as directed. WOUND #6: - Ischium Wound Laterality: Right Cleanser: Soap and Water 2 x Per Week/30 Days Discharge Instructions: May shower and wash wound with dial antibacterial soap and water prior to dressing change. Cleanser: Wound Cleanser 2 x Per Week/30 Days Discharge Instructions: Cleanse the wound with wound cleanser prior to applying a clean dressing using gauze sponges, not tissue or cotton balls. Prim Dressing: wound vac 167mmHg continuous negative pressure 2 x Per Week/30 Days ary Discharge Instructions: black foam WOUND #8: - Trochanter Wound Laterality: Left Cleanser: Soap and Water Every Other Day/30 Days Discharge Instructions: May shower and wash wound with dial antibacterial soap and water prior to dressing change. Cleanser: Wound Cleanser Every Other Day/30 Days Discharge Instructions: Cleanse the  wound with wound cleanser prior to applying a clean dressing using gauze sponges, not tissue or cotton balls. Prim Dressing: Promogran Prisma Matrix, 4.34 (sq in) (silver collagen) Every Other Day/30 Days ary Discharge Instructions: Moisten collagen with saline or hydrogel Secondary Dressing: Zetuvit Plus Silicone Border Dressing 4x4 (in/in) Every Other Day/30 Days Discharge Instructions: Apply silicone border over primary dressing as directed. WOUND #9: - Ischium Wound Laterality: Left Cleanser: Soap and Water 1 x Per Day/30 Days Discharge Instructions: May shower and wash wound with dial antibacterial soap and water prior to dressing change. Cleanser: Wound Cleanser 1 x Per Day/30 Days Discharge Instructions: Cleanse the wound with wound cleanser prior to applying a clean dressing using gauze sponges, not tissue or cotton balls. Prim Dressing: Santyl Ointment 1 x Per Day/30 Days ary Discharge Instructions: Apply nickel thick amount to wound bed as instructed Secondary Dressing: Zetuvit Plus Silicone Border Dressing 4x4 (in/in) 1 x Per Day/30 Days Discharge Instructions: Apply silicone border over primary dressing as directed. #1 I did not change any the primary dressings including Santyl to the left buttock, wound VAC on the right 2. He now has a new wheelchair Hank and uses arm strength to lift his buttocks off the chair he is doing this every 10 minutes 3. Has the specialized air mattress Electronic Signature(s) Signed: 10/06/2021 5:10:51 PM By: Linton Ham MD Entered By: Linton Ham on 10/06/2021 15:47:06 -------------------------------------------------------------------------------- SuperBill Details Patient Name: Date of Service: Veronia Beets 10/06/2021 Medical Record Number: [id number] Patient Account Number: [id number] Date of Birth/Sex: Treating RN: [birth year] (30 y.o. Marcheta Grammes Primary Care Provider: PCP, NO Other Clinician: Referring  Provider: Treating Provider/Extender: Pecola Lawless in Treatment: 61 Diagnosis Coding ICD-10 Codes Code Description L89.314 Pressure ulcer of right buttock, stage 4 L89.324 Pressure ulcer of left buttock, stage 4 L89.223 Pressure ulcer of left hip, stage 3 E43 Unspecified severe protein-calorie malnutrition G82.21 Paraplegia, complete M86.68 Other chronic osteomyelitis, other site Facility Procedures CPT4 Code: [id number] Description: [place] - DEB SUBQ TISSUE 20 SQ CM/< ICD-10 Diagnosis Description L89.324 Pressure ulcer of left buttock, stage 4 Modifier: Quantity: 1 CPT4 Code: [id number] Description: [id number] - WOUND VAC-50 SQ CM OR LESS ICD-10 Diagnosis Description L89.314 Pressure ulcer of right buttock, stage 4 Modifier: Quantity: 1 Physician Procedures : CPT4 Code Description Modifier [id number] - WC PHYS SUBQ TISS 20 SQ CM ICD-10 Diagnosis Description L89.324 Pressure ulcer of left buttock, stage 4 Quantity: 1 Electronic Signature(s) Signed: 10/06/2021 5:10:51 PM By: Linton Ham MD Entered By: Linton Ham on 10/06/2021 15:47:20

## 2021-10-07 NOTE — Progress Notes (Signed)
[name], [name] ([id number]) Visit Report for 10/06/2021 Arrival Information Details Patient Name: Date of Service: [name] 10/06/2021 2:15 PM Medical Record Number: [id number] Patient Account Number: [id number] Date of Birth/Sex: Treating RN: [birth year] (30 y.o. [name], [name] Primary Care Talah Cookston: PCP, NO Other Clinician: Referring Zali Kamaka: Treating Sicilia Killough/Extender: Dorian Heckle in Treatment: 61 Visit Information History Since Last Visit Added or deleted any medications: No Patient Arrived: Wheel Chair Any new allergies or adverse reactions: No Arrival Time: 14:25 Had a fall or experienced change in No Accompanied By: self activities of daily living that may affect Transfer Assistance: Manual risk of falls: Patient Identification Verified: Yes Signs or symptoms of abuse/neglect since last visito No Secondary Verification Process Completed: Yes Hospitalized since last visit: No Patient Requires Transmission-Based Precautions: No Implantable device outside of the clinic excluding No Patient Has Alerts: No cellular tissue based products placed in the center since last visit: Has Dressing in Place as Prescribed: Yes Pain Present Now: No Electronic Signature(s) Signed: 10/07/2021 8:29:36 AM By: [name] Ito Entered By: [name] Ito on 10/06/2021 14:25:48 -------------------------------------------------------------------------------- Encounter Discharge Information Details Patient Name: Date of Service: [name] Regulus. 10/06/2021 2:15 PM Medical Record Number: [id number] Patient Account Number: [id number] Date of Birth/Sex: Treating RN: [birth year] (30 y.o. [name] Primary Care Marti Acebo: PCP, NO Other Clinician: Referring Ameilia Rattan: Treating Anda Sobotta/Extender: Dorian Heckle in Treatment: 61 Encounter Discharge Information Items Post Procedure Vitals Discharge Condition:  Stable Temperature (F): 98.8 Ambulatory Status: Wheelchair Pulse (bpm): 82 Discharge Destination: Home Respiratory Rate (breaths/min): 18 Transportation: Private Auto Blood Pressure (mmHg): 128/74 Schedule Follow-up Appointment: Yes Clinical Summary of Care: Provided on 10/06/2021 Form Type Recipient Paper Patient Patient Electronic Signature(s) Signed: 10/06/2021 4:34:34 PM By: Antonieta Iba Entered By: Antonieta Iba on 10/06/2021 16:34:34 -------------------------------------------------------------------------------- Lower Extremity Assessment Details Patient Name: Date of Service: [name], [name] 10/06/2021 2:15 PM Medical Record Number: [id number] Patient Account Number: [id number] Date of Birth/Sex: Treating RN: [birth year] (30 y.o. [name] Primary Care Franziska Podgurski: PCP, NO Other Clinician: Referring Adiah Guereca: Treating Gissella Niblack/Extender: Dorian Heckle in Treatment: 61 Edema Assessment Assessed: [name]: Yes] Franne Forts: No] Edema: [Left: N] [Right: o] Calf Left: Right: Point of Measurement: From Medial Instep 29 cm Ankle Left: Right: Point of Measurement: From Medial Instep 21 cm Vascular Assessment Pulses: Dorsalis Pedis Palpable: [Left:Yes] Electronic Signature(s) Signed: 10/06/2021 3:00:40 PM By: Antonieta Iba Entered By: Antonieta Iba on 10/06/2021 15:00:40 -------------------------------------------------------------------------------- Multi Wound Chart Details Patient Name: Date of Service: [name] Regulus. 10/06/2021 2:15 PM Medical Record Number: [id number] Patient Account Number: [id number] Date of Birth/Sex: Treating RN: 06-Dec-1990 (30 y.o. [name] Primary Care Orlan Aversa: PCP, NO Other Clinician: Referring Karigan Cloninger: Treating [name]/Extender: Dorian Heckle in Treatment: 61 Vital Signs Height(in): 71 Pulse(bpm): 82 Weight(lbs): 136 Blood Pressure(mmHg): 128/74 Body Mass  Index(BMI): 19 Temperature(F): 98.8 Respiratory Rate(breaths/min): 18 Photos: [10:Left, Lateral Ankle] [11:Sacrum] [6:Right Ischium] Wound Location: [10:Pressure Injury] [11:Pressure Injury] [6:Trauma] Wounding Event: [10:Pressure Ulcer] [11:Pressure Ulcer] [6:Pressure Ulcer] Primary Etiology: [10:Paraplegia] [11:Paraplegia] [6:Paraplegia] Comorbid History: [10:08/13/2021] [11:09/07/2021] [6:02/10/2021] Date Acquired: [10:6] [11:4] [6:33] Weeks of Treatment: [10:Open] [11:Open] [6:Open] Wound Status: [10:0.6x0.9x0.2] [11:0.1x0.1x0.1] [6:5x2.9x1.7] Measurements L x W x D (cm) [10:0.424] [11:0.008] [6:11.388] A (cm) : rea [10:0.085] [11:0.001] [6:19.36] Volume (cm) : [10:87.10%] [11:99.30%] [6:25.60%] % Reduction in A rea: [10:87.10%] [11:99.10%] [6:-1163.70%] % Reduction in Volume: [6:11] Starting Position 1 (o'clock): [6:1] Ending Position 1 (o'clock): [6:2.8] Maximum Distance 1 (  cm): [10:N/A] [11:No] [6:Yes] Undermining: [10:Category/Stage III] [11:Category/Stage III] [6:Category/Stage IV] Classification: [10:Medium] [11:Medium] [6:Medium] Exudate A mount: [10:Serosanguineous] [11:Serosanguineous] [6:Serous] Exudate Type: [10:red, brown] [11:red, brown] [6:amber] Exudate Color: [10:Distinct, outline attached] [11:Flat and Intact] [6:Well defined, not attached] Wound Margin: [10:Large (67-100%)] [11:Large (67-100%)] [6:Large (67-100%)] Granulation A mount: [10:Red, Pink] [11:Pink, Pale] [6:Red, Pink] Granulation Quality: [10:None Present (0%)] [11:None Present (0%)] [6:Small (1-33%)] Necrotic A mount: [10:Fat Layer (Subcutaneous Tissue): Yes Fat Layer (Subcutaneous Tissue): Yes Fat Layer (Subcutaneous Tissue): Yes] Exposed Structures: [10:Fascia: No Tendon: No Muscle: No Joint: No Bone: No Medium (34-66%)] [11:Fascia: No Tendon: No Muscle: No Joint: No Bone: No Large (67-100%)] [6:Fascia: No Tendon: No Muscle: No Joint: No Bone: No None] Epithelialization: [10:N/A] [11:N/A]  [6:N/A] Debridement: [10:N/A] [11:N/A] [6:N/A] Tissue Debrided: [10:N/A] [11:N/A] [6:N/A] Level: [10:N/A] [11:N/A] [6:N/A] Debridement A (sq cm): [10:rea N/A] [11:N/A] [6:N/A] Instrument: [10:N/A] [11:N/A] [6:N/A] Bleeding: [10:N/A] [11:N/A] [6:N/A] Hemostasis A chieved: Debridement Treatment Response: N/A [11:N/A] [6:N/A] Post Debridement Measurements L x N/A [11:N/A] [6:N/A] W x D (cm) [10:N/A] [11:N/A] [6:N/A] Post Debridement Volume: (cm) [10:N/A] [11:N/A] [6:N/A] Post Debridement Stage: [10:N/A] [11:N/A] [6:Negative Pressure Wound Therapy] Procedures Performed: [11:8 9] [6:Maintenance (NPWT) N/A] Left Trochanter Left Ischium N/A Wound Location: Pressure Injury Pressure Injury N/A Wounding Event: Pressure Ulcer Pressure Ulcer N/A Primary Etiology: Paraplegia Paraplegia N/A Comorbid History: 05/09/2021 05/09/2021 N/A Date Acquired: 20 20 N/A Weeks of Treatment: Open Open N/A Wound Status: 0.3x0.4x0.1 2.2x2.3x0.4 N/A Measurements L x W x D (cm) 0.094 3.974 N/A A (cm) : rea 0.009 1.59 N/A Volume (cm) : 75.60% -75.10% N/A % Reduction in A rea: 76.30% -600.40% N/A % Reduction in Volume: No No N/A Undermining: Category/Stage IV Category/Stage III N/A Classification: Medium Medium N/A Exudate A mount: Serous Serous N/A Exudate Type: amber amber N/A Exudate Color: Distinct, outline attached Distinct, outline attached N/A Wound Margin: Large (67-100%) Medium (34-66%) N/A Granulation A mount: Red, Pink Red, Pink N/A Granulation Quality: None Present (0%) Medium (34-66%) N/A Necrotic A mount: Fat Layer (Subcutaneous Tissue): Yes Fat Layer (Subcutaneous Tissue): Yes N/A Exposed Structures: Fascia: No Fascia: No Tendon: No Tendon: No Muscle: No Muscle: No Joint: No Joint: No Bone: No Bone: No Medium (34-66%) Small (1-33%) N/A Epithelialization: N/A Debridement - Excisional N/A Debridement: N/A 15:29 N/A Pre-procedure Verification/Time Out Taken: N/A  Subcutaneous, Slough N/A Tissue Debrided: N/A Skin/Subcutaneous Tissue N/A Level: N/A 5.06 N/A Debridement A (sq cm): rea N/A Curette N/A Instrument: N/A Minimum N/A Bleeding: N/A Pressure N/A Hemostasis A chieved: N/A Procedure was tolerated well N/A Debridement Treatment Response: N/A 2.2x2.3x0.4 N/A Post Debridement Measurements L x W x D (cm) N/A 1.59 N/A Post Debridement Volume: (cm) N/A Category/Stage III N/A Post Debridement Stage: Chemical Cauterization Debridement N/A Procedures Performed: Treatment Notes Electronic Signature(s) Signed: 10/06/2021 5:10:51 PM By: Baltazar Najjar MD Signed: 10/06/2021 5:16:08 PM By: Shawn Stall RN, BSN Entered By: Baltazar Najjar on 10/06/2021 15:40:27 -------------------------------------------------------------------------------- Multi-Disciplinary Care Plan Details Patient Name: Date of Service: KHADIR, ROAM NIO L. 10/06/2021 2:15 PM Medical Record Number: [id number] Patient Account Number: [id number] Date of Birth/Sex: Treating RN: 1991/01/03 (30 y.o. [name] Primary Care Elysabeth Aust: PCP, NO Other Clinician: Referring Lakeesha Fontanilla: Treating Debby Clyne/Extender: Dorian Heckle in Treatment: 61 Multidisciplinary Care Plan reviewed with physician Active Inactive Pressure Nursing Diagnoses: Knowledge deficit related to causes and risk factors for pressure ulcer development Goals: Patient/caregiver will verbalize risk factors for pressure ulcer development Date Initiated: 07/30/2020 Target Resolution Date: 10/19/2021 Goal Status: Active Interventions: Provide education on  pressure ulcers Notes: 06/08/21: Patient recently had new pressure areas, target date extended. Wound/Skin Impairment Nursing Diagnoses: Impaired tissue integrity Goals: Patient/caregiver will verbalize understanding of skin care regimen Date Initiated: 01/05/2021 Target Resolution Date: 10/19/2021 Goal Status:  Active Ulcer/skin breakdown will have a volume reduction of 50% by week 8 Date Initiated: 07/30/2020 Date Inactivated: 10/20/2020 Target Resolution Date: 09/27/2020 Goal Status: Unmet Unmet Reason: Infection Ulcer/skin breakdown will have a volume reduction of 80% by week 12 Date Initiated: 10/20/2020 Date Inactivated: 12/22/2020 Target Resolution Date: 11/19/2020 Unmet Reason: paraplegic, difficulty Goal Status: Unmet offloading Interventions: Provide education on ulcer and skin care Notes: 06/08/21: wound care ongoing. Electronic Signature(s) Signed: 10/06/2021 5:16:41 PM By: Antonieta Iba Entered By: Antonieta Iba on 10/06/2021 14:45:26 -------------------------------------------------------------------------------- Negative Pressure Wound Therapy Maintenance (NPWT) Details Patient Name: Date of Service: TEIGE, ROUNTREE 10/06/2021 2:15 PM Medical Record Number: [id number] Patient Account Number: [id number] Date of Birth/Sex: Treating RN: 1991/03/01 (30 y.o. [name] Primary Care Kyelle Urbas: PCP, NO Other Clinician: Referring Rogan Ecklund: Treating Adel Burch/Extender: Dorian Heckle in Treatment: 61 NPWT Maintenance Performed for: Wound #6 Right Ischium Performed By: Antonieta Iba, RN Type: VAC System Coverage Size (sq cm): 14.5 Pressure Type: Constant Pressure Setting: 125 mmHG Drain Type: None Sponge/Dressing Type: Foam, Black Date Initiated: 09/28/2021 Dressing Removed: Yes Canister Changed: Yes Dressing Reapplied: Yes Days On NPWT : 9 Post Procedure Diagnosis Same as Pre-procedure Electronic Signature(s) Signed: 10/06/2021 5:16:41 PM By: Antonieta Iba Entered By: Antonieta Iba on 10/06/2021 14:56:23 -------------------------------------------------------------------------------- Pain Assessment Details Patient Name: Date of Service: REFORD, OLLIFF L. 10/06/2021 2:15 PM Medical Record Number: [id number] Patient Account  Number: [id number] Date of Birth/Sex: Treating RN: [birth year] (30 y.o. [name] Primary Care Sylwia Cuervo: PCP, NO Other Clinician: Referring Debra Calabretta: Treating Lakie Mclouth/Extender: Dorian Heckle in Treatment: 61 Active Problems Location of Pain Severity and Description of Pain Patient Has Paino No Site Locations Pain Management and Medication Current Pain Management: Electronic Signature(s) Signed: 10/06/2021 5:16:08 PM By: Shawn Stall RN, BSN Signed: 10/07/2021 8:29:36 AM By: [name] Ito Entered By: [name] Ito on 10/06/2021 14:30:35 -------------------------------------------------------------------------------- Patient/Caregiver Education Details Patient Name: Date of Service: [name] Regulus 11/10/2022andnbsp2:15 PM Medical Record Number: [id number] Patient Account Number: [id number] Date of Birth/Gender: Treating RN: 05-25-1991 (30 y.o. [name] Primary Care Physician: PCP, NO Other Clinician: Referring Physician: Treating Physician/Extender: Dorian Heckle in Treatment: 34 Education Assessment Education Provided To: Patient Education Topics Provided Pressure: Methods: Explain/Verbal, Printed Responses: State content correctly Wound/Skin Impairment: Methods: Explain/Verbal, Printed Responses: State content correctly Electronic Signature(s) Signed: 10/06/2021 5:16:41 PM By: Antonieta Iba Entered By: Antonieta Iba on 10/06/2021 14:45:47 -------------------------------------------------------------------------------- Wound Assessment Details Patient Name: Date of Service: DOYL, BITTING L. 10/06/2021 2:15 PM Medical Record Number: [id number] Patient Account Number: [id number] Date of Birth/Sex: Treating RN: 05-03-1991 (30 y.o. [name] Primary Care Yasmin Bronaugh: PCP, NO Other Clinician: Referring Ramsay Bognar: Treating Cambridge Deleo/Extender: Dorian Heckle in  Treatment: 61 Wound Status Wound Number: 10 Primary Etiology: Pressure Ulcer Wound Location: Left, Lateral Ankle Wound Status: Open Wounding Event: Pressure Injury Comorbid History: Paraplegia Date Acquired: 08/13/2021 Weeks Of Treatment: 6 Clustered Wound: No Photos Wound Measurements Length: (cm) 0.6 Width: (cm) 0.9 Depth: (cm) 0.2 Area: (cm) 0.424 Volume: (cm) 0.085 % Reduction in Area: 87.1% % Reduction in Volume: 87.1% Epithelialization: Medium (34-66%) Wound Description Classification: Category/Stage III Wound Margin: Distinct, outline attached Exudate Amount: Medium Exudate Type: Serosanguineous Exudate Color: red,  brown Foul Odor After Cleansing: No Slough/Fibrino No Wound Bed Granulation Amount: Large (67-100%) Exposed Structure Granulation Quality: Red, Pink Fascia Exposed: No Necrotic Amount: None Present (0%) Fat Layer (Subcutaneous Tissue) Exposed: Yes Tendon Exposed: No Muscle Exposed: No Joint Exposed: No Bone Exposed: No Treatment Notes Wound #10 (Ankle) Wound Laterality: Left, Lateral Cleanser Soap and Water Discharge Instruction: May shower and wash wound with dial antibacterial soap and water prior to dressing change. Peri-Wound Care Topical Primary Dressing Promogran Prisma Matrix, 4.34 (sq in) (silver collagen) Discharge Instruction: Moisten collagen with saline or hydrogel Secondary Dressing Zetuvit Plus Silicone Border Dressing 4x4 (in/in) Discharge Instruction: Apply silicone border over primary dressing as directed. Secured With Compression Wrap Compression Stockings Facilities manager) Signed: 10/06/2021 5:16:08 PM By: Shawn Stall RN, BSN Signed: 10/07/2021 8:29:36 AM By: [name] Ito Entered By: [name] Ito on 10/06/2021 14:37:03 -------------------------------------------------------------------------------- Wound Assessment Details Patient Name: Date of Service: ALFERD, OBRYANT 10/06/2021 2:15  PM Medical Record Number: [id number] Patient Account Number: [id number] Date of Birth/Sex: Treating RN: 09-17-1991 (30 y.o. [name] Primary Care Cyruss Arata: PCP, NO Other Clinician: Referring Roselind Klus: Treating Sammye Staff/Extender: Dorian Heckle in Treatment: 61 Wound Status Wound Number: 11 Primary Etiology: Pressure Ulcer Wound Location: Sacrum Wound Status: Open Wounding Event: Pressure Injury Comorbid History: Paraplegia Date Acquired: 09/07/2021 Weeks Of Treatment: 4 Clustered Wound: No Photos Wound Measurements Length: (cm) 0.1 Width: (cm) 0.1 Depth: (cm) 0.1 Area: (cm) 0.008 Volume: (cm) 0.001 % Reduction in Area: 99.3% % Reduction in Volume: 99.1% Epithelialization: Large (67-100%) Tunneling: No Undermining: No Wound Description Classification: Category/Stage III Wound Margin: Flat and Intact Exudate Amount: Medium Exudate Type: Serosanguineous Exudate Color: red, brown Foul Odor After Cleansing: No Slough/Fibrino No Wound Bed Granulation Amount: Large (67-100%) Exposed Structure Granulation Quality: Pink, Pale Fascia Exposed: No Necrotic Amount: None Present (0%) Fat Layer (Subcutaneous Tissue) Exposed: Yes Tendon Exposed: No Muscle Exposed: No Joint Exposed: No Bone Exposed: No Treatment Notes Wound #11 (Sacrum) Cleanser Soap and Water Discharge Instruction: May shower and wash wound with dial antibacterial soap and water prior to dressing change. Wound Cleanser Discharge Instruction: Cleanse the wound with wound cleanser prior to applying a clean dressing using gauze sponges, not tissue or cotton balls. Peri-Wound Care Zinc Oxide Ointment 30g tube Discharge Instruction: Apply Zinc Oxide to periwound with each dressing change Topical Primary Dressing Promogran Prisma Matrix, 4.34 (sq in) (silver collagen) Discharge Instruction: Moisten collagen with saline or hydrogel Secondary Dressing Woven Gauze Sponge,  Non-Sterile 4x4 in Discharge Instruction: Apply over primary dressing as directed. ABD Pad, 5x9 Discharge Instruction: Apply over primary dressing as directed. Secured With 33M Medipore H Soft Cloth Surgical T 4 x 2 (in/yd) ape Discharge Instruction: Secure dressing with tape as directed. Compression Wrap Compression Stockings Add-Ons Electronic Signature(s) Signed: 10/06/2021 5:16:41 PM By: Antonieta Iba Signed: 10/07/2021 8:29:36 AM By: [name] Ito Entered By: [name] Ito on 10/06/2021 14:54:42 -------------------------------------------------------------------------------- Wound Assessment Details Patient Name: Date of Service: [name] 10/06/2021 2:15 PM Medical Record Number: [id number] Patient Account Number: [id number] Date of Birth/Sex: Treating RN: September 29, 1991 (30 y.o. [name] Primary Care Emiline Mancebo: PCP, NO Other Clinician: Referring Broderick Fonseca: Treating Jacki Couse/Extender: Dorian Heckle in Treatment: 61 Wound Status Wound Number: 6 Primary Etiology: Pressure Ulcer Wound Location: Right Ischium Wound Status: Open Wounding Event: Trauma Comorbid History: Paraplegia Date Acquired: 02/10/2021 Weeks Of Treatment: 33 Clustered Wound: No Photos Wound Measurements Length: (cm) 5 Width: (cm) 2.9 Depth: (cm) 1.7  Area: (cm) 11.388 Volume: (cm) 19.36 % Reduction in Area: 25.6% % Reduction in Volume: -1163.7% Epithelialization: None Tunneling: No Undermining: Yes Starting Position (o'clock): 11 Ending Position (o'clock): 1 Maximum Distance: (cm) 2.8 Wound Description Classification: Category/Stage IV Wound Margin: Well defined, not attached Exudate Amount: Medium Exudate Type: Serous Exudate Color: amber Foul Odor After Cleansing: No Slough/Fibrino Yes Wound Bed Granulation Amount: Large (67-100%) Exposed Structure Granulation Quality: Red, Pink Fascia Exposed: No Necrotic Amount: Small (1-33%) Fat  Layer (Subcutaneous Tissue) Exposed: Yes Necrotic Quality: Adherent Slough Tendon Exposed: No Muscle Exposed: No Joint Exposed: No Bone Exposed: No Treatment Notes Wound #6 (Ischium) Wound Laterality: Right Cleanser Soap and Water Discharge Instruction: May shower and wash wound with dial antibacterial soap and water prior to dressing change. Wound Cleanser Discharge Instruction: Cleanse the wound with wound cleanser prior to applying a clean dressing using gauze sponges, not tissue or cotton balls. Peri-Wound Care Topical Primary Dressing wound vac continuous negative pressure Discharge Instruction: black foam Secondary Dressing Secured With Compression Wrap Compression Stockings Add-Ons Electronic Signature(s) Signed: 10/06/2021 5:16:41 PM By: Antonieta Iba Signed: 10/07/2021 8:29:36 AM By: [name] Ito Entered By: [name] Ito on 10/06/2021 14:55:31 -------------------------------------------------------------------------------- Wound Assessment Details Patient Name: Date of Service: [name] 10/06/2021 2:15 PM Medical Record Number: [id number] Patient Account Number: [id number] Date of Birth/Sex: Treating RN: 03/17/1991 (30 y.o. [name] Primary Care Jonaya Freshour: PCP, NO Other Clinician: Referring Montrail Mehrer: Treating Lulia Schriner/Extender: Dorian Heckle in Treatment: 61 Wound Status Wound Number: 8 Primary Etiology: Pressure Ulcer Wound Location: Left Trochanter Wound Status: Open Wounding Event: Pressure Injury Comorbid History: Paraplegia Date Acquired: 05/09/2021 Weeks Of Treatment: 20 Clustered Wound: No Photos Wound Measurements Length: (cm) 0.3 Width: (cm) 0.4 Depth: (cm) 0.1 Area: (cm) 0.094 Volume: (cm) 0.009 % Reduction in Area: 75.6% % Reduction in Volume: 76.3% Epithelialization: Medium (34-66%) Tunneling: No Undermining: No Wound Description Classification: Category/Stage IV Wound  Margin: Distinct, outline attached Exudate Amount: Medium Exudate Type: Serous Exudate Color: amber Foul Odor After Cleansing: No Slough/Fibrino No Wound Bed Granulation Amount: Large (67-100%) Exposed Structure Granulation Quality: Red, Pink Fascia Exposed: No Necrotic Amount: None Present (0%) Fat Layer (Subcutaneous Tissue) Exposed: Yes Tendon Exposed: No Muscle Exposed: No Joint Exposed: No Bone Exposed: No Treatment Notes Wound #8 (Trochanter) Wound Laterality: Left Cleanser Soap and Water Discharge Instruction: May shower and wash wound with dial antibacterial soap and water prior to dressing change. Wound Cleanser Discharge Instruction: Cleanse the wound with wound cleanser prior to applying a clean dressing using gauze sponges, not tissue or cotton balls. Peri-Wound Care Topical Primary Dressing Promogran Prisma Matrix, 4.34 (sq in) (silver collagen) Discharge Instruction: Moisten collagen with saline or hydrogel Secondary Dressing Zetuvit Plus Silicone Border Dressing 4x4 (in/in) Discharge Instruction: Apply silicone border over primary dressing as directed. Secured With Compression Wrap Compression Stockings Facilities manager) Signed: 10/06/2021 5:16:41 PM By: Antonieta Iba Signed: 10/07/2021 8:29:36 AM By: [name] Ito Entered By: [name] Ito on 10/06/2021 14:56:21 -------------------------------------------------------------------------------- Wound Assessment Details Patient Name: Date of Service: [name] 10/06/2021 2:15 PM Medical Record Number: [id number] Patient Account Number: [id number] Date of Birth/Sex: Treating RN: [birth year] (30 y.o. [name] Primary Care Holt Woolbright: PCP, NO Other Clinician: Referring Asiel Chrostowski: Treating Zeferino Mounts/Extender: Dorian Heckle in Treatment: 61 Wound Status Wound Number: 9 Primary Etiology: Pressure Ulcer Wound Location: Left Ischium Wound Status:  Open Wounding Event: Pressure Injury Comorbid History: Paraplegia Date Acquired: 05/09/2021 Weeks Of Treatment: 20  Clustered Wound: No Photos Wound Measurements Length: (cm) 2.2 Width: (cm) 2.3 Depth: (cm) 0.4 Area: (cm) 3.974 Volume: (cm) 1.59 % Reduction in Area: -75.1% % Reduction in Volume: -600.4% Epithelialization: Small (1-33%) Tunneling: No Undermining: No Wound Description Classification: Category/Stage III Wound Margin: Distinct, outline attached Exudate Amount: Medium Exudate Type: Serous Exudate Color: amber Foul Odor After Cleansing: No Slough/Fibrino Yes Wound Bed Granulation Amount: Medium (34-66%) Exposed Structure Granulation Quality: Red, Pink Fascia Exposed: No Necrotic Amount: Medium (34-66%) Fat Layer (Subcutaneous Tissue) Exposed: Yes Necrotic Quality: Adherent Slough Tendon Exposed: No Muscle Exposed: No Joint Exposed: No Bone Exposed: No Treatment Notes Wound #9 (Ischium) Wound Laterality: Left Cleanser Soap and Water Discharge Instruction: May shower and wash wound with dial antibacterial soap and water prior to dressing change. Wound Cleanser Discharge Instruction: Cleanse the wound with wound cleanser prior to applying a clean dressing using gauze sponges, not tissue or cotton balls. Peri-Wound Care Topical Primary Dressing Santyl Ointment Discharge Instruction: Apply nickel thick amount to wound bed as instructed Secondary Dressing Zetuvit Plus Silicone Border Dressing 4x4 (in/in) Discharge Instruction: Apply silicone border over primary dressing as directed. Secured With Compression Wrap Compression Stockings Facilities manager) Signed: 10/06/2021 5:16:41 PM By: Antonieta Iba Signed: 10/07/2021 8:29:36 AM By: [name] Ito Entered By: [name] Ito on 10/06/2021 14:57:03 -------------------------------------------------------------------------------- Vitals Details Patient Name: Date of Service: [name] 10/06/2021 2:15 PM Medical Record Number: [id number] Patient Account Number: [id number] Date of Birth/Sex: Treating RN: 01-27-1991 (30 y.o. [name] Primary Care Zareya Tuckett: PCP, NO Other Clinician: Referring Quetzalli Clos: Treating Kiri Hinderliter/Extender: Dorian Heckle in Treatment: 61 Vital Signs Time Taken: 14:25 Temperature (F): 98.8 Height (in): 71 Pulse (bpm): 82 Weight (lbs): 136 Respiratory Rate (breaths/min): 18 Body Mass Index (BMI): 19 Blood Pressure (mmHg): 128/74 Reference Range: 80 - 120 mg / dl Electronic Signature(s) Signed: 10/07/2021 8:29:36 AM By: [name] Ito Entered By: [name] Ito on 10/06/2021 14:30:18

## 2021-10-10 NOTE — Telephone Encounter (Signed)
Oxycodone approved Confirmation #:[id number] F   Effective Begin Date:10/05/2021 Effective End Date:04/03/2022.  Mr Stoneking notified.

## 2021-10-11 ENCOUNTER — Encounter (HOSPITAL_BASED_OUTPATIENT_CLINIC_OR_DEPARTMENT_OTHER): Payer: Medicaid Other | Admitting: Internal Medicine

## 2021-10-13 ENCOUNTER — Telehealth: Payer: Self-pay | Admitting: *Deleted

## 2021-10-13 ENCOUNTER — Encounter (HOSPITAL_BASED_OUTPATIENT_CLINIC_OR_DEPARTMENT_OTHER): Payer: Medicaid Other | Admitting: Internal Medicine

## 2021-10-13 NOTE — Telephone Encounter (Signed)
Prior auth for Tramadol submitted to Medicaid via [place] [employer]. Status:APPROVED  Effective Begin  Date:10/13/2021  Effective End Date:04/11/2022

## 2021-10-13 NOTE — Telephone Encounter (Deleted)
Prior auth for Tramadol submitted to Medicaid via [place] [employer].

## 2021-10-19 ENCOUNTER — Encounter (HOSPITAL_BASED_OUTPATIENT_CLINIC_OR_DEPARTMENT_OTHER): Payer: Medicaid Other | Admitting: Physician Assistant

## 2021-10-25 ENCOUNTER — Other Ambulatory Visit: Payer: Self-pay

## 2021-10-25 ENCOUNTER — Encounter (HOSPITAL_BASED_OUTPATIENT_CLINIC_OR_DEPARTMENT_OTHER): Payer: Medicaid Other | Admitting: Internal Medicine

## 2021-10-25 DIAGNOSIS — M8668 Other chronic osteomyelitis, other site: Secondary | ICD-10-CM

## 2021-10-25 DIAGNOSIS — L89159 Pressure ulcer of sacral region, unspecified stage: Secondary | ICD-10-CM | POA: Diagnosis not present

## 2021-10-25 DIAGNOSIS — L89324 Pressure ulcer of left buttock, stage 4: Secondary | ICD-10-CM | POA: Diagnosis not present

## 2021-10-25 DIAGNOSIS — L89314 Pressure ulcer of right buttock, stage 4: Secondary | ICD-10-CM

## 2021-10-25 DIAGNOSIS — L89223 Pressure ulcer of left hip, stage 3: Secondary | ICD-10-CM | POA: Diagnosis not present

## 2021-10-25 NOTE — Progress Notes (Signed)
SHAWNTE, DEMAREST ([id number]) Visit Report for 10/25/2021 Arrival Information Details Patient Name: Date of Service: IMMANUEL, FEDAK 10/25/2021 2:00 PM Medical Record Number: [id number] Patient Account Number: [id number] Date of Birth/Sex: Treating RN: 1991-10-09 (30 y.o. [name], [name] Primary Care Taheem Fricke: PCP, NO Other Clinician: Referring Darryl Willner: Treating Oline Belk/Extender: Malachi Pro in Treatment: 64 Visit Information History Since Last Visit Added or deleted any medications: No Patient Arrived: Wheel Chair Any new allergies or adverse reactions: No Arrival Time: 14:23 Had a fall or experienced change in No Accompanied By: self activities of daily living that may affect Transfer Assistance: None risk of falls: Patient Identification Verified: Yes Signs or symptoms of abuse/neglect since last visito No Patient Requires Transmission-Based Precautions: No Hospitalized since last visit: No Patient Has Alerts: No Implantable device outside of the clinic excluding No cellular tissue based products placed in the center since last visit: Has Dressing in Place as Prescribed: Yes Pain Present Now: No Electronic Signature(s) Signed: 10/25/2021 5:02:58 PM By: Karie Schwalbe RN Entered By: Karie Schwalbe on 10/25/2021 14:25:47 -------------------------------------------------------------------------------- Encounter Discharge Information Details Patient Name: Date of Service: [name] NTO NIO L. 10/25/2021 2:00 PM Medical Record Number: [id number] Patient Account Number: [id number] Date of Birth/Sex: Treating RN: [birth year] (30 y.o. [name] Primary Care Kentrell Hallahan: PCP, NO Other Clinician: Referring Fredi Hurtado: Treating Matti Minney/Extender: Malachi Pro in Treatment: 31 Encounter Discharge Information Items Discharge Condition: Stable Ambulatory Status: Wheelchair Discharge Destination:  Home Transportation: Private Auto Accompanied By: self Schedule Follow-up Appointment: Yes Clinical Summary of Care: Patient Declined Electronic Signature(s) Signed: 10/25/2021 5:53:22 PM By: Zenaida Deed RN, BSN Entered By: Zenaida Deed on 10/25/2021 15:44:02 -------------------------------------------------------------------------------- Lower Extremity Assessment Details Patient Name: Date of Service: BERMAN, GRAINGER NTO NIO L. 10/25/2021 2:00 PM Medical Record Number: [id number] Patient Account Number: [id number] Date of Birth/Sex: Treating RN: 1991/07/17 (30 y.o. [name] Primary Care Jamia Hoban: PCP, NO Other Clinician: Referring Shauntae Reitman: Treating Keelin Neville/Extender: Malachi Pro in Treatment: 64 Edema Assessment Assessed: [Left: No] [Right: No] Edema: [Left: N] [Right: o] Calf Left: Right: Point of Measurement: From Medial Instep 29 cm Ankle Left: Right: Point of Measurement: From Medial Instep 21 cm Electronic Signature(s) Signed: 10/25/2021 5:02:58 PM By: Karie Schwalbe RN Signed: 10/25/2021 5:44:57 PM By: Shawn Stall RN, BSN Entered By: Karie Schwalbe on 10/25/2021 14:27:59 -------------------------------------------------------------------------------- Multi Wound Chart Details Patient Name: Date of Service: [name] NTO NIO L. 10/25/2021 2:00 PM Medical Record Number: [id number] Patient Account Number: [id number] Date of Birth/Sex: Treating RN: [birth year] (30 y.o. [name] Primary Care Wataru Mccowen: PCP, NO Other Clinician: Referring Latreshia Beauchaine: Treating Jenevieve Kirschbaum/Extender: Malachi Pro in Treatment: 64 Vital Signs Height(in): 71 Pulse(bpm): 84 Weight(lbs): 136 Blood Pressure(mmHg): 149/84 Body Mass Index(BMI): 19 Temperature(F): 98.4 Respiratory Rate(breaths/min): 16 Photos: Left, Lateral Ankle Sacrum Right Ischium Wound Location: Pressure Injury Pressure Injury Trauma Wounding  Event: Pressure Ulcer Pressure Ulcer Pressure Ulcer Primary Etiology: Paraplegia Paraplegia Paraplegia Comorbid History: 08/13/2021 09/07/2021 02/10/2021 Date Acquired: 8 6 35 Weeks of Treatment: Healed - Epithelialized Healed - Epithelialized Open Wound Status: 0x0x0 0x0x0 5x2.5x1.6 Measurements L x W x D (cm) 0 0 9.817 A (cm) : rea 0 0 15.708 Volume (cm) : 100.00% 100.00% 35.90% % Reduction in A rea: 100.00% 100.00% -925.30% % Reduction in Volume: No No No Undermining: Category/Stage III Category/Stage III Category/Stage IV Classification: Medium None Present Medium Exudate A mount: Serosanguineous N/A Serous Exudate Type: red, brown N/A amber Exudate Color: Distinct,  outline attached Flat and Intact Well defined, not attached Wound Margin: Large (67-100%) Large (67-100%) Large (67-100%) Granulation A mount: Red, Pink Pink, Pale Red, Pink Granulation Quality: None Present (0%) None Present (0%) Small (1-33%) Necrotic A mount: Fat Layer (Subcutaneous Tissue): Yes Fat Layer (Subcutaneous Tissue): Yes Fat Layer (Subcutaneous Tissue): Yes Exposed Structures: Fascia: No Fascia: No Fascia: No Tendon: No Tendon: No Tendon: No Muscle: No Muscle: No Muscle: No Joint: No Joint: No Joint: No Bone: No Bone: No Bone: No Medium (34-66%) Large (67-100%) Small (1-33%) Epithelialization: Wound Number: 8 9 N/A Photos: N/A Left Trochanter Left Ischium N/A Wound Location: Pressure Injury Pressure Injury N/A Wounding Event: Pressure Ulcer Pressure Ulcer N/A Primary Etiology: Paraplegia Paraplegia N/A Comorbid History: 05/09/2021 05/09/2021 N/A Date Acquired: 22 22 N/A Weeks of Treatment: Open Open N/A Wound Status: 0.1x0.1x0.1 1.9x1.8x0.5 N/A Measurements L x W x D (cm) 0.008 2.686 N/A A (cm) : rea 0.001 1.343 N/A Volume (cm) : 97.90% -18.30% N/A % Reduction in A rea: 97.40% -491.60% N/A % Reduction in Volume: 10 Starting Position 1  (o'clock): 11 Ending Position 1 (o'clock): 0.2 Maximum Distance 1 (cm): No Yes N/A Undermining: Category/Stage IV Category/Stage III N/A Classification: Medium Medium N/A Exudate A mount: Serous Serous N/A Exudate Type: amber amber N/A Exudate Color: Distinct, outline attached Distinct, outline attached N/A Wound Margin: Large (67-100%) Medium (34-66%) N/A Granulation A mount: Red, Pink Red, Pink N/A Granulation Quality: None Present (0%) Medium (34-66%) N/A Necrotic A mount: Fat Layer (Subcutaneous Tissue): Yes Fat Layer (Subcutaneous Tissue): Yes N/A Exposed Structures: Fascia: No Fascia: No Tendon: No Tendon: No Muscle: No Muscle: No Joint: No Joint: No Bone: No Bone: No Medium (34-66%) Small (1-33%) N/A Epithelialization: Treatment Notes Electronic Signature(s) Signed: 10/25/2021 3:42:17 PM By: Geralyn Corwin DO Signed: 10/25/2021 5:44:57 PM By: Shawn Stall RN, BSN Entered By: Geralyn Corwin on 10/25/2021 15:09:47 -------------------------------------------------------------------------------- Multi-Disciplinary Care Plan Details Patient Name: Date of Service: [name] NTO NIO L. 10/25/2021 2:00 PM Medical Record Number: [id number] Patient Account Number: [id number] Date of Birth/Sex: Treating RN: Dec 26, 1990 (30 y.o. [name] Primary Care Kamia Insalaco: PCP, NO Other Clinician: Referring Daiya Tamer: Treating Shikha Bibb/Extender: Malachi Pro in Treatment: 20 Multidisciplinary Care Plan reviewed with physician Active Inactive Pressure Nursing Diagnoses: Knowledge deficit related to causes and risk factors for pressure ulcer development Goals: Patient/caregiver will verbalize risk factors for pressure ulcer development Date Initiated: 07/30/2020 Target Resolution Date: 11/16/2021 Goal Status: Active Interventions: Provide education on pressure ulcers Notes: 06/08/21: Patient recently had new pressure areas, target  date extended. Wound/Skin Impairment Nursing Diagnoses: Impaired tissue integrity Goals: Patient/caregiver will verbalize understanding of skin care regimen Date Initiated: 01/05/2021 Target Resolution Date: 11/16/2021 Goal Status: Active Ulcer/skin breakdown will have a volume reduction of 50% by week 8 Date Initiated: 07/30/2020 Date Inactivated: 10/20/2020 Target Resolution Date: 09/27/2020 Goal Status: Unmet Unmet Reason: Infection Ulcer/skin breakdown will have a volume reduction of 80% by week 12 Date Initiated: 10/20/2020 Date Inactivated: 12/22/2020 Target Resolution Date: 11/19/2020 Unmet Reason: paraplegic, difficulty Goal Status: Unmet offloading Interventions: Provide education on ulcer and skin care Notes: 06/08/21: wound care ongoing. Electronic Signature(s) Signed: 10/25/2021 5:53:22 PM By: Zenaida Deed RN, BSN Entered By: Zenaida Deed on 10/25/2021 14:29:14 -------------------------------------------------------------------------------- Negative Pressure Wound Therapy Maintenance (NPWT) Details Patient Name: Date of Service: CAMRON, MONDAY NIO L. 10/25/2021 2:00 PM Medical Record Number: [id number] Patient Account Number: [id number] Date of Birth/Sex: Treating RN: [birth year] (30 y.o. [name] Primary Care Dustan Hyams: PCP, NO Other  Clinician: Referring Jamyria Ozanich: Treating Cherylann Hobday/Extender: Malachi Pro in Treatment: 64 NPWT Maintenance Performed for: Wound #6 Right Ischium Additional Injuries Covered: No Performed By: Zenaida Deed, RN Type: VAC System Coverage Size (sq cm): 12.5 Pressure Type: Constant Pressure Setting: 125 mmHG Drain Type: None Sponge/Dressing Type: Foam, Black Date Initiated: 09/28/2021 Dressing Removed: No Quantity of Sponges/Gauze Removed: pr removed at home Canister Changed: No Dressing Reapplied: No Quantity of Sponges/Gauze Inserted: 1 Respones T Treatment: o tolerated well Days On NPWT  : 28 Post Procedure Diagnosis Same as Pre-procedure Electronic Signature(s) Signed: 10/25/2021 5:53:22 PM By: Zenaida Deed RN, BSN Entered By: Zenaida Deed on 10/25/2021 15:38:21 -------------------------------------------------------------------------------- Pain Assessment Details Patient Name: Date of Service: [name] 10/25/2021 2:00 PM Medical Record Number: [id number] Patient Account Number: [id number] Date of Birth/Sex: Treating RN: December 24, 1990 (30 y.o. [name] Primary Care Deirdre Gryder: PCP, NO Other Clinician: Referring Melea Prezioso: Treating Gaige Fussner/Extender: Malachi Pro in Treatment: 64 Active Problems Location of Pain Severity and Description of Pain Patient Has Paino No Site Locations Pain Management and Medication Current Pain Management: Electronic Signature(s) Signed: 10/25/2021 5:02:58 PM By: Karie Schwalbe RN Signed: 10/25/2021 5:44:57 PM By: Shawn Stall RN, BSN Entered By: Karie Schwalbe on 10/25/2021 14:27:45 -------------------------------------------------------------------------------- Patient/Caregiver Education Details Patient Name: Date of Service: GILFORD, LARDIZABAL 11/29/2022andnbsp2:00 PM Medical Record Number: [id number] Patient Account Number: [id number] Date of Birth/Gender: Treating RN: Jan 20, 1991 (30 y.o. [name] Primary Care Physician: PCP, NO Other Clinician: Referring Physician: Treating Physician/Extender: Malachi Pro in Treatment: 63 Education Assessment Education Provided To: Patient Education Topics Provided Pressure: Methods: Explain/Verbal Responses: Reinforcements needed, State content correctly Wound/Skin Impairment: Methods: Explain/Verbal Responses: Reinforcements needed, State content correctly Electronic Signature(s) Signed: 10/25/2021 5:53:22 PM By: Zenaida Deed RN, BSN Entered By: Zenaida Deed on 10/25/2021  14:29:37 -------------------------------------------------------------------------------- Wound Assessment Details Patient Name: Date of Service: [name] 10/25/2021 2:00 PM Medical Record Number: [id number] Patient Account Number: [id number] Date of Birth/Sex: Treating RN: [birth year] (30 y.o. [name] Primary Care Danh Bayus: PCP, NO Other Clinician: Referring Lemonte Al: Treating Quoc Tome/Extender: Malachi Pro in Treatment: 64 Wound Status Wound Number: 10 Primary Etiology: Pressure Ulcer Wound Location: Left, Lateral Ankle Wound Status: Healed - Epithelialized Wounding Event: Pressure Injury Comorbid History: Paraplegia Date Acquired: 08/13/2021 Weeks Of Treatment: 8 Clustered Wound: No Photos Wound Measurements Length: (cm) Width: (cm) Depth: (cm) Area: (cm) Volume: (cm) 0 % Reduction in Area: 100% 0 % Reduction in Volume: 100% 0 Epithelialization: Medium (34-66%) 0 Tunneling: No 0 Undermining: No Wound Description Classification: Category/Stage III Wound Margin: Distinct, outline attached Exudate Amount: Medium Exudate Type: Serosanguineous Exudate Color: red, brown Foul Odor After Cleansing: No Slough/Fibrino No Wound Bed Granulation Amount: Large (67-100%) Exposed Structure Granulation Quality: Red, Pink Fascia Exposed: No Necrotic Amount: None Present (0%) Fat Layer (Subcutaneous Tissue) Exposed: Yes Tendon Exposed: No Muscle Exposed: No Joint Exposed: No Bone Exposed: No Treatment Notes Wound #10 (Ankle) Wound Laterality: Left, Lateral Cleanser Peri-Wound Care Topical Primary Dressing Secondary Dressing Secured With Compression Wrap Compression Stockings Add-Ons Electronic Signature(s) Signed: 10/25/2021 5:53:22 PM By: Zenaida Deed RN, BSN Entered By: Zenaida Deed on 10/25/2021 15:06:37 -------------------------------------------------------------------------------- Wound Assessment  Details Patient Name: Date of Service: [name] 10/25/2021 2:00 PM Medical Record Number: [id number] Patient Account Number: [id number] Date of Birth/Sex: Treating RN: [birth year] (30 y.o. [name] Primary Care Rejina Odle: PCP, NO Other Clinician: Referring Kendell Gammon: Treating Mandi Mattioli/Extender: Mikey Bussing,  Orlie Dakin, Dorene Grebe Weeks in Treatment: 64 Wound Status Wound Number: 11 Primary Etiology: Pressure Ulcer Wound Location: Sacrum Wound Status: Healed - Epithelialized Wounding Event: Pressure Injury Comorbid History: Paraplegia Date Acquired: 09/07/2021 Weeks Of Treatment: 6 Clustered Wound: No Photos Wound Measurements Length: (cm) Width: (cm) Depth: (cm) Area: (cm) Volume: (cm) 0 % Reduction in Area: 100% 0 % Reduction in Volume: 100% 0 Epithelialization: Large (67-100%) 0 Tunneling: No 0 Undermining: No Wound Description Classification: Category/Stage III Wound Margin: Flat and Intact Exudate Amount: None Present Foul Odor After Cleansing: No Slough/Fibrino No Wound Bed Granulation Amount: Large (67-100%) Exposed Structure Granulation Quality: Pink, Pale Fascia Exposed: No Necrotic Amount: None Present (0%) Fat Layer (Subcutaneous Tissue) Exposed: Yes Tendon Exposed: No Muscle Exposed: No Joint Exposed: No Bone Exposed: No Treatment Notes Wound #11 (Sacrum) Cleanser Peri-Wound Care Topical Primary Dressing Secondary Dressing Secured With Compression Wrap Compression Stockings Add-Ons Electronic Signature(s) Signed: 10/25/2021 5:53:22 PM By: Zenaida Deed RN, BSN Entered By: Zenaida Deed on 10/25/2021 15:06:37 -------------------------------------------------------------------------------- Wound Assessment Details Patient Name: Date of Service: [name] 10/25/2021 2:00 PM Medical Record Number: [id number] Patient Account Number: [id number] Date of Birth/Sex: Treating RN: 21-Jan-1991 (30 y.o. [name] Primary Care Davante Gerke: PCP, NO Other Clinician: Referring Rachana Malesky: Treating Rock Sobol/Extender: Malachi Pro in Treatment: 64 Wound Status Wound Number: 6 Primary Etiology: Pressure Ulcer Wound Location: Right Ischium Wound Status: Open Wounding Event: Trauma Comorbid History: Paraplegia Date Acquired: 02/10/2021 Weeks Of Treatment: 35 Clustered Wound: No Photos Wound Measurements Length: (cm) 5 Width: (cm) 2.5 Depth: (cm) 1.6 Area: (cm) 9.817 Volume: (cm) 15.708 % Reduction in Area: 35.9% % Reduction in Volume: -925.3% Epithelialization: Small (1-33%) Tunneling: No Undermining: No Wound Description Classification: Category/Stage IV Wound Margin: Well defined, not attached Exudate Amount: Medium Exudate Type: Serous Exudate Color: amber Foul Odor After Cleansing: No Slough/Fibrino Yes Wound Bed Granulation Amount: Large (67-100%) Exposed Structure Granulation Quality: Red, Pink Fascia Exposed: No Necrotic Amount: Small (1-33%) Fat Layer (Subcutaneous Tissue) Exposed: Yes Necrotic Quality: Adherent Slough Tendon Exposed: No Muscle Exposed: No Joint Exposed: No Bone Exposed: No Treatment Notes Wound #6 (Ischium) Wound Laterality: Right Cleanser Soap and Water Discharge Instruction: May shower and wash wound with dial antibacterial soap and water prior to dressing change. Wound Cleanser Discharge Instruction: Cleanse the wound with wound cleanser prior to applying a clean dressing using gauze sponges, not tissue or cotton balls. Peri-Wound Care Topical Primary Dressing wound vac continuous negative pressure Discharge Instruction: black foam Secondary Dressing Secured With Compression Wrap Compression Stockings Add-Ons Electronic Signature(s) Signed: 10/25/2021 5:02:58 PM By: Karie Schwalbe RN Signed: 10/25/2021 5:44:57 PM By: Shawn Stall RN, BSN Entered By: Karie Schwalbe on 10/25/2021  14:56:23 -------------------------------------------------------------------------------- Wound Assessment Details Patient Name: Date of Service: [name] NTO NIO L. 10/25/2021 2:00 PM Medical Record Number: [id number] Patient Account Number: [id number] Date of Birth/Sex: Treating RN: 1991/10/25 (30 y.o. [name] Primary Care Marcelene Weidemann: PCP, NO Other Clinician: Referring Casidee Jann: Treating Zalyn Amend/Extender: Malachi Pro in Treatment: 64 Wound Status Wound Number: 8 Primary Etiology: Pressure Ulcer Wound Location: Left Trochanter Wound Status: Open Wounding Event: Pressure Injury Comorbid History: Paraplegia Date Acquired: 05/09/2021 Weeks Of Treatment: 22 Clustered Wound: No Photos Wound Measurements Length: (cm) 0.1 Width: (cm) 0.1 Depth: (cm) 0.1 Area: (cm) 0.008 Volume: (cm) 0.001 % Reduction in Area: 97.9% % Reduction in Volume: 97.4% Epithelialization: Medium (34-66%) Tunneling: No Undermining: No Wound Description Classification: Category/Stage IV Wound Margin: Distinct, outline  attached Exudate Amount: Medium Exudate Type: Serous Exudate Color: amber Foul Odor After Cleansing: No Slough/Fibrino No Wound Bed Granulation Amount: Large (67-100%) Exposed Structure Granulation Quality: Red, Pink Fascia Exposed: No Necrotic Amount: None Present (0%) Fat Layer (Subcutaneous Tissue) Exposed: Yes Tendon Exposed: No Muscle Exposed: No Joint Exposed: No Bone Exposed: No Treatment Notes Wound #8 (Trochanter) Wound Laterality: Left Cleanser Soap and Water Discharge Instruction: May shower and wash wound with dial antibacterial soap and water prior to dressing change. Wound Cleanser Discharge Instruction: Cleanse the wound with wound cleanser prior to applying a clean dressing using gauze sponges, not tissue or cotton balls. Peri-Wound Care Topical Primary Dressing Secondary Dressing Zetuvit Plus Silicone Border Dressing  4x4 (in/in) Discharge Instruction: Apply silicone border over primary dressing as directed. Secured With Compression Wrap Compression Stockings Facilities manager) Signed: 10/25/2021 5:53:22 PM By: Zenaida Deed RN, BSN Entered By: Zenaida Deed on 10/25/2021 15:07:44 -------------------------------------------------------------------------------- Wound Assessment Details Patient Name: Date of Service: ARDON, FRANKLIN NTO NIO L. 10/25/2021 2:00 PM Medical Record Number: [id number] Patient Account Number: [id number] Date of Birth/Sex: Treating RN: 08/03/1991 (30 y.o. [name] Primary Care Sammi Stolarz: PCP, NO Other Clinician: Referring Viney Acocella: Treating Xan Sparkman/Extender: Malachi Pro in Treatment: 64 Wound Status Wound Number: 9 Primary Etiology: Pressure Ulcer Wound Location: Left Ischium Wound Status: Open Wounding Event: Pressure Injury Comorbid History: Paraplegia Date Acquired: 05/09/2021 Weeks Of Treatment: 22 Clustered Wound: No Photos Wound Measurements Length: (cm) 1.9 Width: (cm) 1.8 Depth: (cm) 0.5 Area: (cm) 2.686 Volume: (cm) 1.343 % Reduction in Area: -18.3% % Reduction in Volume: -491.6% Epithelialization: Small (1-33%) Tunneling: No Undermining: Yes Starting Position (o'clock): 10 Ending Position (o'clock): 11 Maximum Distance: (cm) 0.2 Wound Description Classification: Category/Stage III Wound Margin: Distinct, outline attached Exudate Amount: Medium Exudate Type: Serous Exudate Color: amber Foul Odor After Cleansing: No Slough/Fibrino Yes Wound Bed Granulation Amount: Medium (34-66%) Exposed Structure Granulation Quality: Red, Pink Fascia Exposed: No Necrotic Amount: Medium (34-66%) Fat Layer (Subcutaneous Tissue) Exposed: Yes Necrotic Quality: Adherent Slough Tendon Exposed: No Muscle Exposed: No Joint Exposed: No Bone Exposed: No Treatment Notes Wound #9 (Ischium) Wound Laterality:  Left Cleanser Soap and Water Discharge Instruction: May shower and wash wound with dial antibacterial soap and water prior to dressing change. Wound Cleanser Discharge Instruction: Cleanse the wound with wound cleanser prior to applying a clean dressing using gauze sponges, not tissue or cotton balls. Peri-Wound Care Topical Primary Dressing Santyl Ointment Discharge Instruction: Apply nickel thick amount to wound bed as instructed Secondary Dressing Zetuvit Plus Silicone Border Dressing 4x4 (in/in) Discharge Instruction: Apply silicone border over primary dressing as directed. Secured With Compression Wrap Compression Stockings Facilities manager) Signed: 10/25/2021 5:02:58 PM By: Karie Schwalbe RN Signed: 10/25/2021 5:44:57 PM By: Shawn Stall RN, BSN Entered By: Karie Schwalbe on 10/25/2021 14:58:10 -------------------------------------------------------------------------------- Vitals Details Patient Name: Date of Service: [name] 10/25/2021 2:00 PM Medical Record Number: [id number] Patient Account Number: [id number] Date of Birth/Sex: Treating RN: Mar 21, 1991 (30 y.o. [name] Primary Care Laverle Pillard: PCP, NO Other Clinician: Referring Keil Pickering: Treating Dariush Mcnellis/Extender: Malachi Pro in Treatment: 64 Vital Signs Time Taken: 14:26 Temperature (F): 98.4 Height (in): 71 Pulse (bpm): 84 Weight (lbs): 136 Respiratory Rate (breaths/min): 16 Body Mass Index (BMI): 19 Blood Pressure (mmHg): 149/84 Reference Range: 80 - 120 mg / dl Electronic Signature(s) Signed: 10/25/2021 5:02:58 PM By: Karie Schwalbe RN Entered By: Karie Schwalbe on 10/25/2021 14:27:26

## 2021-10-25 NOTE — Progress Notes (Addendum)
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 Pressure ulcer of left buttock, stage 4 Pressure ulcer of left hip, stage 3 Unspecified severe protein-calorie malnutrition Paraplegia, complete Other chronic osteomyelitis, other site Two of the 4  wounds have healed. The remaining wounds are on each buttocks. He has been using the wound VAC to the right side and I recommended continuing this. I recommended continuing Santyl to the left buttocks. No obvious signs of infection. Plan 1. Santyl to the left buttocks daily 2. Wound VAC to the right buttocksoochanged twice weekly 3. Follow-up in 1 week Electronic Signature(s) Signed: 10/25/2021 3:42:17 PM By: Kalman Shan DO Entered By: Kalman Shan on 10/25/2021 15:37:55 -------------------------------------------------------------------------------- HxROS Details Patient Name: Date of Service: [name] Pavlov L. 10/25/2021 2:00 PM Medical Record Number: [id number] Patient Account Number: [id number] Date of Birth/Sex: Treating RN: [birth year] (30 y.o. [name] Primary Care Provider: PCP, NO Other Clinician: Referring Provider: Treating Provider/Extender: America Brown in Treatment: 64 Information Obtained From Patient Eyes Medical History: Negative for: Cataracts; Glaucoma; Optic Neuritis Ear/Nose/Mouth/Throat Medical History: Negative for: Chronic sinus problems/congestion; Middle ear problems Hematologic/Lymphatic Medical History: Negative for: Anemia; Hemophilia; Human Immunodeficiency Virus; Lymphedema; Sickle Cell Disease Respiratory Medical History: Negative for: Aspiration; Asthma; Chronic  Obstructive Pulmonary Disease (COPD); Pneumothorax; Sleep Apnea; Tuberculosis Cardiovascular Medical History: Negative for: Angina; Arrhythmia; Congestive Heart Failure; Coronary Artery Disease; Deep Vein Thrombosis; Hypertension; Hypotension; Myocardial Infarction; Peripheral Arterial Disease; Peripheral Venous Disease; Phlebitis; Vasculitis Gastrointestinal Medical History: Negative for: Cirrhosis ; Colitis; Crohns; Hepatitis A; Hepatitis B; Hepatitis C Endocrine Medical History: Negative for: Type I Diabetes; Type II  Diabetes Genitourinary Medical History: Negative for: End Stage Renal Disease Immunological Medical History: Negative for: Lupus Erythematosus; Raynauds; Scleroderma Integumentary (Skin) Medical History: Negative for: History of Burn Musculoskeletal Medical History: Negative for: Gout; Rheumatoid Arthritis; Osteoarthritis; Osteomyelitis Neurologic Medical History: Positive for: Paraplegia Negative for: Dementia; Neuropathy; Quadriplegia; Seizure Disorder Oncologic Medical History: Negative for: Received Chemotherapy; Received Radiation Psychiatric Medical History: Negative for: Anorexia/bulimia; Confinement Anxiety Immunizations Pneumococcal Vaccine: Received Pneumococcal Vaccination: No Implantable Devices None Family and Social History Cancer: No; Diabetes: No; Heart Disease: No; Hereditary Spherocytosis: No; Hypertension: Yes - Mother; Kidney Disease: No; Lung Disease: No; Seizures: No; Stroke: No; Thyroid Problems: No; Tuberculosis: No; Current every day smoker; Marital Status - Single; Alcohol Use: Never; Drug Use: Current History - pot; Caffeine Use: Rarely; Financial Concerns: No; Food, Clothing or Shelter Needs: No; Support System Lacking: No; Transportation Concerns: No Electronic Signature(s) Signed: 10/25/2021 3:42:17 PM By: Kalman Shan DO Signed: 10/25/2021 5:44:57 PM By: [name] RN, BSN Entered By: Kalman Shan on 10/25/2021 15:11:14 -------------------------------------------------------------------------------- SuperBill Details Patient Name: Date of Service: Veronia Beets. 10/25/2021 Medical Record Number: [id number] Patient Account Number: [id number] Date of Birth/Sex: Treating RN: 1991/06/09 (30 y.o. [name] Primary Care Provider: PCP, NO Other Clinician: Referring Provider: Treating Provider/Extender: America Brown in Treatment: 64 Diagnosis Coding ICD-10 Codes Code Description L89.314  Pressure ulcer of right buttock, stage 4 L89.324 Pressure ulcer of left buttock, stage 4 L89.223 Pressure ulcer of left hip, stage 3 E43 Unspecified severe protein-calorie malnutrition G82.21 Paraplegia, complete M86.68 Other chronic osteomyelitis, other site Facility Procedures CPT4 Code: [id number] Description: [id number] - WOUND VAC-50 SQ CM OR LESS Modifier: Quantity: 1 Physician Procedures : CPT4 Code Description Modifier [id number] - WC PHYS LEVEL 3 - EST PT ICD-10 Diagnosis Description L89.314 Pressure ulcer of right buttock, stage 4 L89.324 Pressure ulcer of left buttock, stage 4 L89.223 Pressure ulcer of left hip, stage 3 M86.68  Other chronic osteomyelitis, other site Quantity: 1 Electronic Signature(s) Signed: 10/26/2021 4:52:58 PM By: Baruch Gouty RN, BSN Signed: 10/27/2021 9:22:28 AM By: Kalman Shan DO Previous Signature: 10/25/2021 3:42:17 PM Version By: Kalman Shan DO Entered By: Baruch Gouty on 10/26/2021 10:53:57

## 2021-11-01 ENCOUNTER — Encounter (HOSPITAL_BASED_OUTPATIENT_CLINIC_OR_DEPARTMENT_OTHER): Payer: Medicaid Other | Admitting: Internal Medicine

## 2021-11-02 ENCOUNTER — Encounter (HOSPITAL_BASED_OUTPATIENT_CLINIC_OR_DEPARTMENT_OTHER): Payer: Medicaid Other | Admitting: Physician Assistant

## 2021-11-03 ENCOUNTER — Other Ambulatory Visit: Payer: Self-pay

## 2021-11-03 ENCOUNTER — Encounter (HOSPITAL_BASED_OUTPATIENT_CLINIC_OR_DEPARTMENT_OTHER): Payer: Self-pay

## 2021-11-03 ENCOUNTER — Encounter (HOSPITAL_BASED_OUTPATIENT_CLINIC_OR_DEPARTMENT_OTHER): Payer: Medicaid Other | Admitting: Internal Medicine

## 2021-11-09 ENCOUNTER — Encounter (HOSPITAL_BASED_OUTPATIENT_CLINIC_OR_DEPARTMENT_OTHER): Payer: Medicaid Other | Admitting: Physician Assistant

## 2021-11-11 ENCOUNTER — Encounter (HOSPITAL_COMMUNITY): Payer: Self-pay

## 2021-11-11 ENCOUNTER — Emergency Department (HOSPITAL_COMMUNITY): Payer: Medicaid Other

## 2021-11-11 ENCOUNTER — Inpatient Hospital Stay (HOSPITAL_COMMUNITY)
Admission: EM | Admit: 2021-11-11 | Discharge: 2021-11-15 | DRG: 391 | Disposition: A | Discharge status: Home / Self Care | Payer: Medicaid Other | Attending: Internal Medicine | Admitting: Internal Medicine

## 2021-11-11 DIAGNOSIS — S24103S Unspecified injury at T7-T10 level of thoracic spinal cord, sequela: Secondary | ICD-10-CM | POA: Diagnosis not present

## 2021-11-11 DIAGNOSIS — K529 Noninfective gastroenteritis and colitis, unspecified: Secondary | ICD-10-CM | POA: Diagnosis present

## 2021-11-11 DIAGNOSIS — Z79899 Other long term (current) drug therapy: Secondary | ICD-10-CM | POA: Diagnosis not present

## 2021-11-11 DIAGNOSIS — D509 Iron deficiency anemia, unspecified: Secondary | ICD-10-CM | POA: Diagnosis present

## 2021-11-11 DIAGNOSIS — K259 Gastric ulcer, unspecified as acute or chronic, without hemorrhage or perforation: Secondary | ICD-10-CM | POA: Diagnosis present

## 2021-11-11 DIAGNOSIS — R112 Nausea with vomiting, unspecified: Secondary | ICD-10-CM | POA: Diagnosis not present

## 2021-11-11 DIAGNOSIS — Z20822 Contact with and (suspected) exposure to covid-19: Secondary | ICD-10-CM

## 2021-11-11 DIAGNOSIS — G8220 Paraplegia, unspecified: Secondary | ICD-10-CM | POA: Diagnosis present

## 2021-11-11 DIAGNOSIS — A09 Infectious gastroenteritis and colitis, unspecified: Principal | ICD-10-CM | POA: Diagnosis present

## 2021-11-11 DIAGNOSIS — L89314 Pressure ulcer of right buttock, stage 4: Secondary | ICD-10-CM | POA: Diagnosis not present

## 2021-11-11 DIAGNOSIS — G894 Chronic pain syndrome: Secondary | ICD-10-CM | POA: Diagnosis present

## 2021-11-11 DIAGNOSIS — R197 Diarrhea, unspecified: Secondary | ICD-10-CM | POA: Diagnosis not present

## 2021-11-11 DIAGNOSIS — L8990 Pressure ulcer of unspecified site, unspecified stage: Secondary | ICD-10-CM | POA: Diagnosis present

## 2021-11-11 DIAGNOSIS — Z833 Family history of diabetes mellitus: Secondary | ICD-10-CM | POA: Diagnosis not present

## 2021-11-11 DIAGNOSIS — R1013 Epigastric pain: Secondary | ICD-10-CM | POA: Diagnosis not present

## 2021-11-11 DIAGNOSIS — K592 Neurogenic bowel, not elsewhere classified: Secondary | ICD-10-CM | POA: Diagnosis present

## 2021-11-11 DIAGNOSIS — K3189 Other diseases of stomach and duodenum: Secondary | ICD-10-CM | POA: Diagnosis present

## 2021-11-11 DIAGNOSIS — K5900 Constipation, unspecified: Secondary | ICD-10-CM | POA: Diagnosis present

## 2021-11-11 DIAGNOSIS — R630 Anorexia: Secondary | ICD-10-CM | POA: Diagnosis present

## 2021-11-11 DIAGNOSIS — Z87891 Personal history of nicotine dependence: Secondary | ICD-10-CM

## 2021-11-11 DIAGNOSIS — W3400XS Accidental discharge from unspecified firearms or gun, sequela: Secondary | ICD-10-CM | POA: Diagnosis not present

## 2021-11-11 DIAGNOSIS — K449 Diaphragmatic hernia without obstruction or gangrene: Secondary | ICD-10-CM | POA: Diagnosis present

## 2021-11-11 DIAGNOSIS — Z681 Body mass index (BMI) 19 or less, adult: Secondary | ICD-10-CM

## 2021-11-11 DIAGNOSIS — R933 Abnormal findings on diagnostic imaging of other parts of digestive tract: Secondary | ICD-10-CM | POA: Diagnosis not present

## 2021-11-11 DIAGNOSIS — Z8249 Family history of ischemic heart disease and other diseases of the circulatory system: Secondary | ICD-10-CM | POA: Diagnosis not present

## 2021-11-11 DIAGNOSIS — R109 Unspecified abdominal pain: Secondary | ICD-10-CM

## 2021-11-11 DIAGNOSIS — R509 Fever, unspecified: Secondary | ICD-10-CM | POA: Diagnosis present

## 2021-11-11 DIAGNOSIS — M25752 Osteophyte, left hip: Secondary | ICD-10-CM | POA: Diagnosis present

## 2021-11-11 DIAGNOSIS — G9589 Other specified diseases of spinal cord: Secondary | ICD-10-CM | POA: Diagnosis present

## 2021-11-11 DIAGNOSIS — L89313 Pressure ulcer of right buttock, stage 3: Secondary | ICD-10-CM | POA: Diagnosis present

## 2021-11-11 DIAGNOSIS — K2970 Gastritis, unspecified, without bleeding: Secondary | ICD-10-CM | POA: Diagnosis present

## 2021-11-11 LAB — COMPREHENSIVE METABOLIC PANEL
ALT: 33 U/L (ref 0–44)
AST: 36 U/L (ref 15–41)
Albumin: 3.5 g/dL (ref 3.5–5.0)
Alkaline Phosphatase: 74 U/L (ref 38–126)
Anion gap: 8 (ref 5–15)
BUN: 11 mg/dL (ref 6–20)
CO2: 28 mmol/L (ref 22–32)
Calcium: 9.4 mg/dL (ref 8.9–10.3)
Chloride: 102 mmol/L (ref 98–111)
Creatinine, Ser: 1.01 mg/dL (ref 0.61–1.24)
GFR, Estimated: >60 mL/min (ref >60)
Glucose, Bld: 151 mg/dL — ABNORMAL HIGH (ref 70–99)
Potassium: 4.2 mmol/L (ref 3.5–5.1)
Sodium: 138 mmol/L (ref 135–145)
Total Bilirubin: 0.6 mg/dL (ref 0.3–1.2)
Total Protein: 7.9 g/dL (ref 6.5–8.1)

## 2021-11-11 LAB — CBC WITH DIFFERENTIAL/PLATELET
Abs Immature Granulocytes: 0.1 10*3/uL — ABNORMAL HIGH (ref 0.00–0.07)
Basophils Absolute: 0.1 10*3/uL (ref 0.0–0.1)
Basophils Relative: 0 %
Eosinophils Absolute: 0 10*3/uL (ref 0.0–0.5)
Eosinophils Relative: 0 %
HCT: 42 % (ref 39.0–52.0)
Hemoglobin: 12.4 g/dL — ABNORMAL LOW (ref 13.0–17.0)
Immature Granulocytes: 1 %
Lymphocytes Relative: 2 %
Lymphs Abs: 0.4 10*3/uL — ABNORMAL LOW (ref 0.7–4.0)
MCH: 21.6 pg — ABNORMAL LOW (ref 26.0–34.0)
MCHC: 29.5 g/dL — ABNORMAL LOW (ref 30.0–36.0)
MCV: 73 fL — ABNORMAL LOW (ref 80.0–100.0)
Monocytes Absolute: 0.7 10*3/uL (ref 0.1–1.0)
Monocytes Relative: 3 %
Neutro Abs: 19.3 10*3/uL — ABNORMAL HIGH (ref 1.7–7.7)
Neutrophils Relative %: 94 %
Platelets: 442 10*3/uL — ABNORMAL HIGH (ref 150–400)
RBC: 5.75 MIL/uL (ref 4.22–5.81)
RDW: 19.1 % — ABNORMAL HIGH (ref 11.5–15.5)
WBC: 20.5 10*3/uL — ABNORMAL HIGH (ref 4.0–10.5)
nRBC: 0 % (ref 0.0–0.2)

## 2021-11-11 LAB — RESP PANEL BY RT-PCR (FLU A&B, COVID) ARPGX2
Influenza A by PCR: NEGATIVE
Influenza B by PCR: NEGATIVE
SARS Coronavirus 2 by RT PCR: NEGATIVE

## 2021-11-11 LAB — LIPASE, BLOOD: Lipase: 21 U/L (ref 11–51)

## 2021-11-11 LAB — LACTIC ACID, PLASMA: Lactic Acid, Venous: 2.3 mmol/L (ref 0.5–1.9)

## 2021-11-11 MED ORDER — SODIUM CHLORIDE 0.9 % IV SOLN: 1.0000 g | Freq: Once | INTRAVENOUS | Status: DC

## 2021-11-11 MED ORDER — IOHEXOL 300 MG/ML  SOLN
100.0000 mL | Freq: Once | INTRAMUSCULAR | Status: AC | PRN
  Administered 2021-11-11: 100 mL via INTRAVENOUS

## 2021-11-11 MED ORDER — PIPERACILLIN-TAZOBACTAM 3.375 G IVPB 30 MIN
3.3750 g | Freq: Once | INTRAVENOUS | Status: AC
  Administered 2021-11-11: 3.375 g via INTRAVENOUS
  Filled 2021-11-11: qty 50

## 2021-11-11 MED ORDER — ONDANSETRON 4 MG PO TBDP
4.0000 mg | ORAL_TABLET | Freq: Once | ORAL | Status: AC
  Administered 2021-11-11: 4 mg via ORAL
  Filled 2021-11-11: qty 1

## 2021-11-11 MED ORDER — PIPERACILLIN-TAZOBACTAM 3.375 G IVPB
3.3750 g | Freq: Three times a day (TID) | INTRAVENOUS | Status: DC
  Administered 2021-11-12 – 2021-11-14 (×9): 3.375 g via INTRAVENOUS
  Filled 2021-11-11 (×9): qty 50

## 2021-11-11 MED ORDER — METRONIDAZOLE 500 MG/100ML IV SOLN: 500.0000 mg | Freq: Two times a day (BID) | INTRAVENOUS | Status: DC

## 2021-11-11 MED ORDER — SODIUM CHLORIDE 0.9 % IV BOLUS
1000.0000 mL | Freq: Once | INTRAVENOUS | Status: AC
  Administered 2021-11-11: 1000 mL via INTRAVENOUS

## 2021-11-11 MED ORDER — MORPHINE SULFATE (PF) 4 MG/ML IV SOLN
4.0000 mg | Freq: Once | INTRAVENOUS | Status: AC
  Administered 2021-11-11: 4 mg via INTRAVENOUS
  Filled 2021-11-11: qty 1

## 2021-11-11 NOTE — ED Notes (Signed)
Patient transported to CT 

## 2021-11-11 NOTE — ED Provider Notes (Signed)
Emergency Medicine Provider Triage Evaluation Note  [name] , a 30 y.o. male  was evaluated in triage.  Pt complains of nausea, vomiting, diarrhea, HA since this morning. Patient also complaining of diffuse abdominal pain. Patient denies sick contacts.   Review of Systems  Positive: N/V/D, abdominal pain, headache Negative: SOB, CP, cough, sore throat  Physical Exam  BP (!) 132/98 (BP Location: Left Arm)    Pulse (!) 140    Temp 98.5 F (36.9 C) (Oral)    Resp 16    Ht 6' (1.829 m)    Wt 61.2 kg    SpO2 100%    BMI 18.31 kg/m  Gen:   Awake, no distress   Resp:  Normal effort  MSK:   Patient is paraplegic  Other:  Diffuse abdominal pain  Medical Decision Making  Medically screening exam initiated at 7:30 PM.  Appropriate orders placed.  Daelin L Morain was informed that the remainder of the evaluation will be completed by another provider, this initial triage assessment does not replace that evaluation, and the importance of remaining in the ED until their evaluation is complete.     Al Decant, PA-C 11/11/21 1932    Bethann Berkshire, MD 11/11/21 [phone]

## 2021-11-11 NOTE — Progress Notes (Signed)
Pharmacy Antibiotic Note  [name] is a 30 y.o. male admitted on 11/11/2021 with  intra-abdominal infection .  Pharmacy has been consulted for zosyn dosing. Patient with a history of pseudomonas infection.  Estimated Creatinine Clearance: 91.7 mL/min (by C-G formula based on SCr of 1.01 mg/dL). WBC 20.5; T 98.5 F  Plan: Zosyn 3.375g IV q8h (4 hour infusion) Trend WBC, Fever, Renal function, & Clinical course F/u cultures, clinical course, WBC, fever De-escalate when able  Height: 6' (182.9 cm) Weight: 61.2 kg (135 lb) IBW/kg (Calculated) : 77.6  Temp (24hrs), Avg:98.5 F (36.9 C), Min:98.5 F (36.9 C), Max:98.5 F (36.9 C)  Recent Labs  Lab 11/11/21 1940  WBC 20.5*  CREATININE 1.01    Estimated Creatinine Clearance: 91.7 mL/min (by C-G formula based on SCr of 1.01 mg/dL).    No Known Allergies  Antimicrobials this admission: zosyn 12/16 >>   Microbiology results: Pending  Thank you for allowing pharmacy to be a part of this patients care.  Delmar Landau, PharmD, BCPS 11/11/2021 11:13 PM ED Clinical Pharmacist -  [phone]
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
## 2021-11-11 NOTE — ED Triage Notes (Signed)
Pt states that he has been having n/v/d all day with some fevers at home.

## 2021-11-12 ENCOUNTER — Encounter (HOSPITAL_COMMUNITY): Payer: Self-pay | Admitting: Family Medicine

## 2021-11-12 DIAGNOSIS — R112 Nausea with vomiting, unspecified: Principal | ICD-10-CM | POA: Insufficient documentation

## 2021-11-12 DIAGNOSIS — R197 Diarrhea, unspecified: Secondary | ICD-10-CM | POA: Insufficient documentation

## 2021-11-12 LAB — COMPREHENSIVE METABOLIC PANEL
ALT: 49 U/L — ABNORMAL HIGH (ref 0–44)
AST: 37 U/L (ref 15–41)
Albumin: 2.8 g/dL — ABNORMAL LOW (ref 3.5–5.0)
Alkaline Phosphatase: 67 U/L (ref 38–126)
Anion gap: 5 (ref 5–15)
BUN: 9 mg/dL (ref 6–20)
CO2: 31 mmol/L (ref 22–32)
Calcium: 8.5 mg/dL — ABNORMAL LOW (ref 8.9–10.3)
Chloride: 101 mmol/L (ref 98–111)
Creatinine, Ser: 0.84 mg/dL (ref 0.61–1.24)
GFR, Estimated: >60 mL/min (ref >60)
Glucose, Bld: 97 mg/dL (ref 70–99)
Potassium: 4 mmol/L (ref 3.5–5.1)
Sodium: 137 mmol/L (ref 135–145)
Total Bilirubin: 0.1 mg/dL — ABNORMAL LOW (ref 0.3–1.2)
Total Protein: 6.7 g/dL (ref 6.5–8.1)

## 2021-11-12 LAB — URINALYSIS, ROUTINE W REFLEX MICROSCOPIC
Bilirubin Urine: NEGATIVE
Glucose, UA: NEGATIVE mg/dL
Ketones, ur: NEGATIVE mg/dL
Leukocytes,Ua: NEGATIVE
Nitrite: POSITIVE — AB
Protein, ur: 100 mg/dL — AB
Specific Gravity, Urine: 1.02 (ref 1.005–1.030)
pH: 8.5 — ABNORMAL HIGH (ref 5.0–8.0)

## 2021-11-12 LAB — URINALYSIS, MICROSCOPIC (REFLEX)

## 2021-11-12 LAB — CBC
HCT: 36.3 % — ABNORMAL LOW (ref 39.0–52.0)
Hemoglobin: 10.5 g/dL — ABNORMAL LOW (ref 13.0–17.0)
MCH: 21.4 pg — ABNORMAL LOW (ref 26.0–34.0)
MCHC: 28.9 g/dL — ABNORMAL LOW (ref 30.0–36.0)
MCV: 74.1 fL — ABNORMAL LOW (ref 80.0–100.0)
Platelets: 391 10*3/uL (ref 150–400)
RBC: 4.9 MIL/uL (ref 4.22–5.81)
RDW: 18.9 % — ABNORMAL HIGH (ref 11.5–15.5)
WBC: 16.8 10*3/uL — ABNORMAL HIGH (ref 4.0–10.5)
nRBC: 0 % (ref 0.0–0.2)

## 2021-11-12 LAB — LACTIC ACID, PLASMA: Lactic Acid, Venous: 2 mmol/L (ref 0.5–1.9)

## 2021-11-12 MED ORDER — HYDROMORPHONE HCL 1 MG/ML IJ SOLN
0.5000 mg | INTRAMUSCULAR | Status: DC | PRN
  Administered 2021-11-12 – 2021-11-15 (×7): 1 mg via INTRAVENOUS
  Filled 2021-11-12 (×8): qty 1

## 2021-11-12 MED ORDER — ONDANSETRON HCL 4 MG/2ML IJ SOLN: 4.0000 mg | Freq: Four times a day (QID) | INTRAMUSCULAR | Status: DC | PRN

## 2021-11-12 MED ORDER — CHLORHEXIDINE GLUCONATE CLOTH 2 % EX PADS
6.0000 | MEDICATED_PAD | Freq: Every day | CUTANEOUS | Status: DC
  Administered 2021-11-12 – 2021-11-15 (×4): 6 via TOPICAL

## 2021-11-12 MED ORDER — SODIUM CHLORIDE 0.9% FLUSH
3.0000 mL | Freq: Two times a day (BID) | INTRAVENOUS | Status: DC
  Administered 2021-11-12 – 2021-11-14 (×6): 3 mL via INTRAVENOUS

## 2021-11-12 MED ORDER — ACETAMINOPHEN 325 MG PO TABS: 650.0000 mg | ORAL_TABLET | Freq: Four times a day (QID) | ORAL | Status: DC | PRN

## 2021-11-12 MED ORDER — ACETAMINOPHEN 650 MG RE SUPP: 650.0000 mg | Freq: Four times a day (QID) | RECTAL | Status: DC | PRN

## 2021-11-12 MED ORDER — ENOXAPARIN SODIUM 40 MG/0.4ML IJ SOSY
40.0000 mg | PREFILLED_SYRINGE | Freq: Every day | INTRAMUSCULAR | Status: DC
  Administered 2021-11-13 – 2021-11-14 (×2): 40 mg via SUBCUTANEOUS
  Filled 2021-11-12 (×2): qty 0.4

## 2021-11-12 MED ORDER — ONDANSETRON HCL 4 MG PO TABS: 4.0000 mg | ORAL_TABLET | Freq: Four times a day (QID) | ORAL | Status: DC | PRN

## 2021-11-12 MED ORDER — LACTATED RINGERS IV BOLUS
500.0000 mL | Freq: Once | INTRAVENOUS | Status: AC
  Administered 2021-11-12: 500 mL via INTRAVENOUS

## 2021-11-12 MED ORDER — BACLOFEN 10 MG PO TABS
40.0000 mg | ORAL_TABLET | Freq: Three times a day (TID) | ORAL | Status: DC
  Administered 2021-11-12 – 2021-11-15 (×15): 40 mg via ORAL
  Filled 2021-11-12: qty 2
  Filled 2021-11-12: qty 4
  Filled 2021-11-12: qty 2
  Filled 2021-11-12 (×2): qty 4
  Filled 2021-11-12: qty 2
  Filled 2021-11-12: qty 4
  Filled 2021-11-12: qty 2
  Filled 2021-11-12: qty 4
  Filled 2021-11-12: qty 2
  Filled 2021-11-12 (×9): qty 4

## 2021-11-12 MED ORDER — LACTATED RINGERS IV SOLN: INTRAVENOUS | Status: AC
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
## 2021-11-13 LAB — COMPREHENSIVE METABOLIC PANEL
ALT: 42 U/L (ref 0–44)
AST: 26 U/L (ref 15–41)
Albumin: 2.9 g/dL — ABNORMAL LOW (ref 3.5–5.0)
Alkaline Phosphatase: 62 U/L (ref 38–126)
Anion gap: 7 (ref 5–15)
BUN: 7 mg/dL (ref 6–20)
CO2: 27 mmol/L (ref 22–32)
Calcium: 8.6 mg/dL — ABNORMAL LOW (ref 8.9–10.3)
Chloride: 102 mmol/L (ref 98–111)
Creatinine, Ser: 0.88 mg/dL (ref 0.61–1.24)
GFR, Estimated: >60 mL/min (ref >60)
Glucose, Bld: 87 mg/dL (ref 70–99)
Potassium: 3.8 mmol/L (ref 3.5–5.1)
Sodium: 136 mmol/L (ref 135–145)
Total Bilirubin: 0.5 mg/dL (ref 0.3–1.2)
Total Protein: 6.5 g/dL (ref 6.5–8.1)

## 2021-11-13 LAB — CBC
HCT: 35.6 % — ABNORMAL LOW (ref 39.0–52.0)
Hemoglobin: 10.3 g/dL — ABNORMAL LOW (ref 13.0–17.0)
MCH: 20.9 pg — ABNORMAL LOW (ref 26.0–34.0)
MCHC: 28.9 g/dL — ABNORMAL LOW (ref 30.0–36.0)
MCV: 72.4 fL — ABNORMAL LOW (ref 80.0–100.0)
Platelets: 374 10*3/uL (ref 150–400)
RBC: 4.92 MIL/uL (ref 4.22–5.81)
RDW: 18.4 % — ABNORMAL HIGH (ref 11.5–15.5)
WBC: 8.3 10*3/uL (ref 4.0–10.5)
nRBC: 0 % (ref 0.0–0.2)

## 2021-11-13 LAB — LACTIC ACID, PLASMA: Lactic Acid, Venous: 0.9 mmol/L (ref 0.5–1.9)

## 2021-11-13 LAB — MAGNESIUM: Magnesium: 1.9 mg/dL (ref 1.7–2.4)

## 2021-11-13 MED ORDER — BOOST PLUS PO LIQD
237.0000 mL | Freq: Three times a day (TID) | ORAL | Status: DC
  Administered 2021-11-13 – 2021-11-14 (×3): 237 mL via ORAL
  Filled 2021-11-13 (×5): qty 237

## 2021-11-13 MED ORDER — ZINC SULFATE 220 (50 ZN) MG PO CAPS
220.0000 mg | ORAL_CAPSULE | Freq: Every day | ORAL | Status: DC
  Administered 2021-11-13 – 2021-11-15 (×3): 220 mg via ORAL
  Filled 2021-11-13 (×3): qty 1
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
## 2021-11-14 ENCOUNTER — Inpatient Hospital Stay (HOSPITAL_COMMUNITY): Payer: Medicaid Other

## 2021-11-14 DIAGNOSIS — R933 Abnormal findings on diagnostic imaging of other parts of digestive tract: Secondary | ICD-10-CM

## 2021-11-14 DIAGNOSIS — R1013 Epigastric pain: Secondary | ICD-10-CM

## 2021-11-14 LAB — CBC
HCT: 33.6 % — ABNORMAL LOW (ref 39.0–52.0)
Hemoglobin: 9.9 g/dL — ABNORMAL LOW (ref 13.0–17.0)
MCH: 21.1 pg — ABNORMAL LOW (ref 26.0–34.0)
MCHC: 29.5 g/dL — ABNORMAL LOW (ref 30.0–36.0)
MCV: 71.6 fL — ABNORMAL LOW (ref 80.0–100.0)
Platelets: 391 10*3/uL (ref 150–400)
RBC: 4.69 MIL/uL (ref 4.22–5.81)
RDW: 18.1 % — ABNORMAL HIGH (ref 11.5–15.5)
WBC: 7.5 10*3/uL (ref 4.0–10.5)
nRBC: 0 % (ref 0.0–0.2)

## 2021-11-14 MED ORDER — ENSURE ENLIVE PO LIQD: 237.0000 mL | Freq: Two times a day (BID) | ORAL | Status: DC

## 2021-11-14 MED ORDER — JUVEN PO PACK
1.0000 | PACK | Freq: Two times a day (BID) | ORAL | Status: DC
  Administered 2021-11-15: 10:00:00 1 via ORAL
  Filled 2021-11-14: qty 1

## 2021-11-14 MED ORDER — ENOXAPARIN SODIUM 40 MG/0.4ML IJ SOSY
40.0000 mg | PREFILLED_SYRINGE | Freq: Every day | INTRAMUSCULAR | Status: DC
  Administered 2021-11-15: 10:00:00 40 mg via SUBCUTANEOUS
  Filled 2021-11-14: qty 0.4

## 2021-11-14 MED ORDER — SODIUM CHLORIDE 0.9 % IV SOLN: INTRAVENOUS | Status: DC

## 2021-11-14 MED ORDER — BISACODYL 10 MG RE SUPP
10.0000 mg | Freq: Once | RECTAL | Status: AC
  Administered 2021-11-14: 15:00:00 10 mg via RECTAL
  Filled 2021-11-14: qty 1

## 2021-11-14 MED ORDER — ADULT MULTIVITAMIN W/MINERALS CH
1.0000 | ORAL_TABLET | Freq: Every day | ORAL | Status: DC
  Administered 2021-11-14 – 2021-11-15 (×2): 1 via ORAL
  Filled 2021-11-14 (×2): qty 1

## 2021-11-14 MED ORDER — PANTOPRAZOLE SODIUM 40 MG PO TBEC
40.0000 mg | DELAYED_RELEASE_TABLET | Freq: Two times a day (BID) | ORAL | Status: DC
  Administered 2021-11-14 – 2021-11-15 (×2): 40 mg via ORAL
  Filled 2021-11-14 (×3): qty 1
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
## 2021-11-14 NOTE — Progress Notes (Addendum)
PT Cancellation Note  Patient Details Name: [name] MRN: [id number] DOB: [birth year]   Cancelled Treatment:    Reason Eval/Treat Not Completed: PT screened, no needs identified, will sign off Pt reports he is functioning at baseline and has had no changes since admission. Declines OOB mobility at this time stating. "I can get into that chair and do everything I need to do." Reports he will get into his w/c later after taking a nap. Has support from significant other and mother who is an Char[employer] fundraiser. Reviewed importance of OOB and pressure relief. Verbalized understanding. Will sign off.   Blake Divine A Hendrik Donath 11/14/2021, 2:30 PM [place], PT, DPT Acute Rehabilitation Services Pager [phone] Office [phone]

## 2021-11-14 NOTE — Consult Note (Addendum)
WOC Nurse Consult Note: Reason for Consult: Consult requested for buttocks/sacrum.  Pt has several chronic wounds and states he is followed by the outpatient wound care center and they have greatly improved.  Wound type: Sacrum and upper bilat buttocks with pink dry scar tissue from previous wounds which have healed.  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 Right outer ischium chronic healing Stage 3 pressure injury; .5X.5X.1cm, pink and dry, no drainage Right lower ischium with chronic healing Stage 3 pressure injury; 1X3X.2cm, red and moist, small amt tan drainage Pressure Injury POA: Yes Dressing procedure/placement/frequency: Pt states he has been using wet to dry dressings and will follow-up with the outpatient wound care center after discharge.  Topical treatment orders provided for bedside nurses to perform as follows:  1. Apply moist gauze packing to left ischium Q day and cover with ABD pad and tape.  2. Apply foam dressing to right ischium wounds in 2 locations and change Q 3 days or PRN soiling. Please re-consult if further assistance is needed.  Thank-you,  Cammie Mcgee MSN, RN, CWOCN, [place], CNS [phone]
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
## 2021-11-14 NOTE — Progress Notes (Signed)
OT Cancellation Note  Patient Details Name: [name] MRN: [id number] DOB: [birth year]   Cancelled Treatment:    Reason Eval/Treat Not Completed: OT screened, no needs identified, will sign off. Pt reports he is functioning at his baseline and has reliable assistance of his fiance at home and mom, who is an Charity fundraiser, [employer]. Reminded pt in importance of pressure relief in bed and w/c and OOB activity with nursing staff. Pt verbalized understanding.   Evern Bio 11/14/2021, 1:45 PM Martie Round, OTR/L Acute Rehabilitation Services Pager: [phone] Office: [phone]

## 2021-11-14 NOTE — Progress Notes (Signed)
Pharmacy Antibiotic Note  [name] is a 30 y.o. male admitted on 11/11/2021 with  Colitis .  Pharmacy has been consulted for Zosyn dosing.  ID: IAI with h/o pseudomonas. Colitis. Recented treated with antibiotics for infected stage IV decubitus ulcer .  - Tmax 99.  Scr <1, Lactic acid down WNL  Zosyn 12/16 >>   12/16 Cdiff >> sent  12/16 GI panel >> sent  COVID neg  Plan: Zosyn 3.375g IV q8hrs. No renal adjustment needed at this time Pharmacy will sign off. Please reconsult for further dosing assitance.   Height: 6' (182.9 cm) Weight: 61.2 kg (135 lb) IBW/kg (Calculated) : 77.6  Temp (24hrs), Avg:98.5 F (36.9 C), Min:98.1 F (36.7 C), Max:99 F (37.2 C)  Recent Labs  Lab 11/11/21 1940 11/11/21 2307 11/12/21 0500 11/12/21 0611 11/12/21 0808 11/13/21 0059 11/14/21 0052  WBC 20.5*  --  16.8*  --   --  8.3 7.5  CREATININE 1.01  --   --   --  0.84 0.88  --   LATICACIDVEN  --  2.3*  --  2.0*  --  0.9  --     Estimated Creatinine Clearance: 105.3 mL/min (by C-G formula based on SCr of 0.88 mg/dL).    No Known Allergies  Rafe Mackowski S. Merilynn Finland, PharmD, BCPS Clinical Staff Pharmacist [email or web address]   Pasty Spillers 11/14/2021 10:15 AM
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
## 2021-11-15 ENCOUNTER — Encounter (HOSPITAL_COMMUNITY)
Admission: EM | Disposition: A | Discharge status: Home / Self Care | Payer: Self-pay | Source: Home / Self Care | Attending: Internal Medicine

## 2021-11-15 ENCOUNTER — Inpatient Hospital Stay (HOSPITAL_COMMUNITY): Payer: Medicaid Other | Admitting: Critical Care Medicine

## 2021-11-15 ENCOUNTER — Encounter (HOSPITAL_COMMUNITY): Payer: Self-pay | Admitting: Family Medicine

## 2021-11-15 DIAGNOSIS — K2970 Gastritis, unspecified, without bleeding: Secondary | ICD-10-CM

## 2021-11-15 DIAGNOSIS — K259 Gastric ulcer, unspecified as acute or chronic, without hemorrhage or perforation: Secondary | ICD-10-CM

## 2021-11-15 HISTORY — PX: BIOPSY: SHX5522

## 2021-11-15 HISTORY — PX: ESOPHAGOGASTRODUODENOSCOPY (EGD) WITH PROPOFOL: SHX5813

## 2021-11-15 SURGERY — ESOPHAGOGASTRODUODENOSCOPY (EGD) WITH PROPOFOL
Anesthesia: Monitor Anesthesia Care

## 2021-11-15 MED ORDER — METRONIDAZOLE 500 MG PO TABS: 500.0000 mg | ORAL_TABLET | Freq: Two times a day (BID) | ORAL | 0 refills | Status: AC

## 2021-11-15 MED ORDER — MIDAZOLAM HCL 5 MG/5ML IJ SOLN
INTRAMUSCULAR | Status: DC | PRN
Start: 2021-11-15 — End: 2021-11-15
  Administered 2021-11-15 (×2): 1 mg via INTRAVENOUS

## 2021-11-15 MED ORDER — ZINC SULFATE 220 (50 ZN) MG PO CAPS: 220.0000 mg | ORAL_CAPSULE | Freq: Every day | ORAL | 1 refills | Status: DC

## 2021-11-15 MED ORDER — SODIUM CHLORIDE 0.9 % IV SOLN: INTRAVENOUS | Status: DC

## 2021-11-15 MED ORDER — PROPOFOL 500 MG/50ML IV EMUL
INTRAVENOUS | Status: DC | PRN
  Administered 2021-11-15: 200 ug/kg/min via INTRAVENOUS

## 2021-11-15 MED ORDER — PROPOFOL 10 MG/ML IV BOLUS
INTRAVENOUS | Status: DC | PRN
  Administered 2021-11-15: 20 mg via INTRAVENOUS

## 2021-11-15 MED ORDER — PANTOPRAZOLE SODIUM 40 MG PO TBEC: 40.0000 mg | DELAYED_RELEASE_TABLET | Freq: Two times a day (BID) | ORAL | 3 refills | Status: DC

## 2021-11-15 MED ORDER — AMOXICILLIN-POT CLAVULANATE 875-125 MG PO TABS: 1.0000 | ORAL_TABLET | Freq: Two times a day (BID) | ORAL | 0 refills | Status: DC

## 2021-11-15 MED ORDER — POLYETHYLENE GLYCOL 3350 17 G PO PACK: 17.0000 g | PACK | Freq: Every day | ORAL | Status: DC

## 2021-11-15 MED ORDER — POLYETHYLENE GLYCOL 3350 17 G PO PACK: 17.0000 g | PACK | Freq: Every day | ORAL | 0 refills | Status: DC

## 2021-11-15 MED ORDER — CEPHALEXIN 500 MG PO CAPS: 500.0000 mg | ORAL_CAPSULE | Freq: Three times a day (TID) | ORAL | 0 refills | Status: AC

## 2021-11-15 SURGICAL SUPPLY — 15 items
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
## 2021-11-15 NOTE — Transfer of Care (Signed)
Immediate Anesthesia Transfer of Care Note  Patient: [name]  Procedure(s) Performed: ESOPHAGOGASTRODUODENOSCOPY (EGD) WITH PROPOFOL BIOPSY  Patient Location: PACU  Anesthesia Type:MAC  Level of Consciousness: drowsy  Airway & Oxygen Therapy: Patient Spontanous Breathing and Patient connected to nasal cannula oxygen  Post-op Assessment: Report given to RN and Post -op Vital signs reviewed and stable  Post vital signs: Reviewed and stable  Last Vitals:  Vitals Value Taken Time  BP    Temp    Pulse 94 11/15/21 0759  Resp 10 11/15/21 0759  SpO2 100 % 11/15/21 0759  Vitals shown include unvalidated device data.  Last Pain:  Vitals:   11/15/21 0710  TempSrc: Oral  PainSc: 7          Complications: No notable events documented.

## 2021-11-15 NOTE — Social Work (Signed)
°  Transition of Care [facility]) Screening Note   Patient Details  Name: DEL OVERFELT Date of Birth: 07-10-1991   Transition of Care [facility]) CM/SW Contact:    Jimmy Picket, LCSW Phone Number: 11/15/2021, 11:15 AM    Transition of Care Department [facility]) has reviewed patient and no TOC needs have been identified at this time. We will continue to monitor patient advancement through interdisciplinary progression rounds. If new patient transition needs arise, please place a TOC consult.

## 2021-11-15 NOTE — Op Note (Signed)
[facility] Patient Name: [name] Procedure Date : 11/15/2021 MRN: [id number] Attending MD: Justice Britain , MD Date of Birth: 11-11-1991 CSN: [id number] Age: 30 Admit Type: Inpatient Procedure:                Upper GI endoscopy Indications:              Epigastric abdominal pain, Iron deficiency anemia,                            Abnormal CT of the GI tract Providers:                Justice Britain, MD, Kary Kos RN, RN, Tyna Jaksch Technician Referring MD:             [facility] Medicines:                Monitored Anesthesia Care Complications:            No immediate complications. Estimated Blood Loss:     Estimated blood loss was minimal. Procedure:                Pre-Anesthesia Assessment:                           - Prior to the procedure, a History and Physical                            was performed, and patient medications and                            allergies were reviewed. The patient's tolerance of                            previous anesthesia was also reviewed. The risks                            and benefits of the procedure and the sedation                            options and risks were discussed with the patient.                            All questions were answered, and informed consent                            was obtained. Prior Anticoagulants: The patient has                            taken no previous anticoagulant or antiplatelet                            agents. ASA Grade Assessment: II - A patient with  mild systemic disease. After reviewing the risks                            and benefits, the patient was deemed in                            satisfactory condition to undergo the procedure.                           After obtaining informed consent, the endoscope was                            passed under direct vision. Throughout the                             procedure, the patient's blood pressure, pulse, and                            oxygen saturations were monitored continuously. The                            [id number]-H190 ([id number]) Olympus endoscope was introduced                            through the mouth, and advanced to the second part                            of duodenum. The upper GI endoscopy was                            accomplished without difficulty. The patient                            tolerated the procedure. Scope In: Scope Out: Findings:      No gross lesions were noted in the entire esophagus.      The Z-line was regular and was found 40 cm from the incisors.      A 2 cm hiatal hernia was present.      A single 10 mm submucosal papule (nodule) with no bleeding and no       stigmata of recent bleeding was found in the gastric antrum. Negative       pillow sign. Tunneled biopsies were taken with a cold forceps for       histology to try to define pathology.      Two non-bleeding superficial gastric ulcers with a clean ulcer base       (Forrest Class III) were found on the greater curvature of the stomach.       The largest lesion was 10 mm in largest dimension.      Patchy moderate inflammation characterized by erosions, erythema,       friability and granularity was found in the entire examined stomach.       Biopsies were taken with a cold forceps for histology and Helicobacter       pylori testing.      Diffuse mildly erythematous mucosa without active bleeding and with no  stigmata of bleeding was found in the duodenal bulb, in the first       portion of the duodenum and in the second portion of the duodenum.       Biopsies were taken with a cold forceps for histology.      Moderate angulation deformity was noted in the visualized duodenum. Impression:               - No gross lesions in esophagus. Z-line regular, 40                            cm from the incisors.                           - 2 cm  hiatal hernia.                           - A single submucosal papule (nodule) found in the                            stomach. Tunnel biopsied.                           - Non-bleeding gastric ulcers with a clean ulcer                            base (Forrest Class III). Gastritis - biopsied.                           - Erythematous duodenopathy - biopsied.                           - Duodenal angulation deformity noted. Recommendation:           - The patient will be observed post-procedure,                            until all discharge criteria are met.                           - Return patient to hospital ward for possible                            discharge same day.                           - Advance diet as tolerated.                           - Use Protonix (pantoprazole) 40 mg PO BID.                           - Observe patient's clinical course.                           - Await pathology results and treat HP if positive.                           -  If patient wants to follow up in clinic, he may                            to discuss consideration of colonoscopy for follow                            up of "colitis" on recent imaging as well as his                            IDA/Microcytic Anemia that he has had. Some                            component of this is likely from gastric disease                            but should keep in mind.                           - Strong consideration of repeat upper endoscopy in                            3-4 months to check healing of gastric ulceration                            and inflammation.                           - The findings and recommendations were discussed                            with the patient.                           - The findings and recommendations were discussed                            with the referring physician. Procedure Code(s):        --- Professional ---                           [phone],  Esophagogastroduodenoscopy, flexible,                            transoral; with biopsy, single or multiple Diagnosis Code(s):        --- Professional ---                           K44.9, Diaphragmatic hernia without obstruction or                            gangrene                           K31.89, Other diseases of stomach and duodenum  K25.9, Gastric ulcer, unspecified as acute or                            chronic, without hemorrhage or perforation                           K29.70, Gastritis, unspecified, without bleeding                           R10.13, Epigastric pain                           D50.9, Iron deficiency anemia, unspecified                           R93.3, Abnormal findings on diagnostic imaging of                            other parts of digestive tract CPT copyright 2019 [employer]. All rights reserved. The codes documented in this report are preliminary and upon coder review may  be revised to meet current compliance requirements. Justice Britain, MD 11/15/2021 8:00:57 AM Number of Addenda: 0

## 2021-11-15 NOTE — Anesthesia Procedure Notes (Signed)
Procedure Name: MAC Date/Time: 11/15/2021 7:35 AM Performed by: Wilburn Cornelia, CRNA Pre-anesthesia Checklist: Emergency Drugs available, Patient identified, Suction available, Patient being monitored and Timeout performed Patient Re-evaluated:Patient Re-evaluated prior to induction Oxygen Delivery Method: Nasal cannula Placement Confirmation: positive ETCO2 and breath sounds checked- equal and bilateral Dental Injury: Teeth and Oropharynx as per pre-operative assessment

## 2021-11-15 NOTE — Progress Notes (Signed)
Pt discharged home with his wife in stable condition

## 2021-11-15 NOTE — Anesthesia Postprocedure Evaluation (Signed)
Anesthesia Post Note  Patient: [name]  Procedure(s) Performed: ESOPHAGOGASTRODUODENOSCOPY (EGD) WITH PROPOFOL BIOPSY     Patient location during evaluation: Endoscopy Anesthesia Type: MAC Level of consciousness: awake Pain management: pain level controlled Vital Signs Assessment: post-procedure vital signs reviewed and stable Respiratory status: spontaneous breathing, nonlabored ventilation, respiratory function stable and patient connected to nasal cannula oxygen Cardiovascular status: stable and blood pressure returned to baseline Postop Assessment: no apparent nausea or vomiting Anesthetic complications: no   No notable events documented.  Last Vitals:  Vitals:   11/15/21 0841 11/15/21 1622  BP: 125/84 119/85  Pulse: 76 70  Resp: 13 20  Temp: (!) 36.4 C 36.7 C  SpO2: 100% 100%    Last Pain:  Vitals:   11/15/21 1622  TempSrc: Oral  PainSc:                  Sharnika Binney P Nalleli Largent

## 2021-11-15 NOTE — Anesthesia Preprocedure Evaluation (Addendum)
Anesthesia Evaluation  Patient identified by MRN, date of birth, ID band Patient awake    Reviewed: Allergy & Precautions, NPO status , Patient's Chart, lab work & pertinent test results  Airway Mallampati: II  TM Distance: >3 FB Neck ROM: Full    Dental no notable dental hx.    Pulmonary former smoker,    Pulmonary exam normal breath sounds clear to auscultation       Cardiovascular negative cardio ROS Normal cardiovascular exam Rhythm:Regular Rate:Normal     Neuro/Psych PSYCHIATRIC DISORDERS Anxiety Injury of thoracic spinal cord Paraplegia     GI/Hepatic negative GI ROS, (+)     substance abuse  ,   Endo/Other  negative endocrine ROS  Renal/GU negative Renal ROS     Musculoskeletal Stage I pressure ulcer of sacral region   Abdominal   Peds  Hematology  (+) anemia ,   Anesthesia Other Findings epigastric pain  Reproductive/Obstetrics                            Anesthesia Physical Anesthesia Plan  ASA: 3  Anesthesia Plan: MAC   Post-op Pain Management:    Induction: Intravenous  PONV Risk Score and Plan: 1 and Propofol infusion and Treatment may vary due to age or medical condition  Airway Management Planned: Nasal Cannula  Additional Equipment:   Intra-op Plan:   Post-operative Plan:   Informed Consent: I have reviewed the patients History and Physical, chart, labs and discussed the procedure including the risks, benefits and alternatives for the proposed anesthesia with the patient or authorized representative who has indicated his/her understanding and acceptance.     Dental advisory given  Plan Discussed with: CRNA  Anesthesia Plan Comments:        Anesthesia Quick Evaluation

## 2021-11-15 NOTE — Interval H&P Note (Signed)
History and Physical Interval Note:  11/15/2021 7:32 AM  [name]  has presented today for surgery, with the diagnosis of epigastric pain.  The various methods of treatment have been discussed with the patient and family. After consideration of risks, benefits and other options for treatment, the patient has consented to  Procedure(s): ESOPHAGOGASTRODUODENOSCOPY (EGD) WITH PROPOFOL (N/A) as a surgical intervention.  The patient's history has been reviewed, patient examined, no change in status, stable for surgery.  I have reviewed the patient's chart and labs.  Questions were answered to the patient's satisfaction.     [employer]

## 2021-11-16 ENCOUNTER — Encounter (HOSPITAL_BASED_OUTPATIENT_CLINIC_OR_DEPARTMENT_OTHER): Payer: Medicaid Other | Admitting: Physician Assistant

## 2021-11-16 ENCOUNTER — Encounter (HOSPITAL_COMMUNITY): Payer: Self-pay | Admitting: Gastroenterology

## 2021-11-16 ENCOUNTER — Encounter: Payer: Self-pay | Admitting: Gastroenterology

## 2021-11-16 LAB — SURGICAL PATHOLOGY

## 2021-11-17 LAB — VITAMIN C: Vitamin C: 0.6 mg/dL (ref 0.4–2.0)

## 2021-11-18 LAB — VITAMIN A: Vitamin A (Retinoic Acid): 43.7 ug/dL (ref 18.9–57.3)

## 2021-11-21 LAB — ZINC: Zinc: 60 ug/dL (ref 44–115)

## 2021-11-30 ENCOUNTER — Encounter (HOSPITAL_BASED_OUTPATIENT_CLINIC_OR_DEPARTMENT_OTHER): Payer: Medicaid Other | Attending: Physician Assistant | Admitting: Physician Assistant

## 2021-11-30 ENCOUNTER — Other Ambulatory Visit: Payer: Self-pay

## 2021-11-30 DIAGNOSIS — G8221 Paraplegia, complete: Secondary | ICD-10-CM | POA: Insufficient documentation

## 2021-11-30 DIAGNOSIS — M8668 Other chronic osteomyelitis, other site: Secondary | ICD-10-CM | POA: Insufficient documentation

## 2021-11-30 DIAGNOSIS — L89314 Pressure ulcer of right buttock, stage 4: Secondary | ICD-10-CM | POA: Insufficient documentation

## 2021-11-30 DIAGNOSIS — L89324 Pressure ulcer of left buttock, stage 4: Secondary | ICD-10-CM | POA: Diagnosis present

## 2021-11-30 DIAGNOSIS — E43 Unspecified severe protein-calorie malnutrition: Secondary | ICD-10-CM | POA: Insufficient documentation

## 2021-11-30 DIAGNOSIS — L89223 Pressure ulcer of left hip, stage 3: Secondary | ICD-10-CM | POA: Diagnosis not present

## 2021-11-30 NOTE — Progress Notes (Addendum)
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 zinc oxide WOUND #6: - Ischium Wound Laterality: Right Cleanser: Soap and Water 1 x Per Day/Other:90 days Discharge Instructions: May shower and wash wound with dial antibacterial soap and water prior to dressing change. Cleanser: Wound Cleanser 1 x Per Day/Other:90  days Discharge Instructions: Cleanse the wound with wound cleanser prior to applying a clean dressing using gauze sponges, not tissue or cotton balls. Prim Dressing: Woven Gauze Sponges, Sterile 4x4 (in/in) 1 x Per Day/Other:90 days ary Discharge Instructions: moisten with saline and pack lightly into wound Secondary Dressing: Zetuvit Plus Silicone Border Dressing 4x4 (in/in) 1 x Per Day/Other:90 days Discharge Instructions: Apply silicone border over primary dressing as directed. WOUND #9: - Ischium Wound Laterality: Left Cleanser: Soap and Water  1 x Per Day/30 Days Discharge Instructions: May shower and wash wound with dial antibacterial soap and water prior to dressing change. Cleanser: Wound Cleanser 1 x Per Day/30 Days Discharge Instructions: Cleanse the wound with wound cleanser prior to applying a clean dressing using gauze sponges, not tissue or cotton balls. Prim Dressing: Santyl Ointment 1 x Per Day/30 Days ary Discharge Instructions: Apply nickel thick amount to wound bed as instructed Secondary Dressing: Zetuvit Plus Silicone Border Dressing 4x4 (in/in) 1 x Per Day/30 Days Discharge Instructions: Apply silicone border over primary dressing as directed. 1. Would recommend currently that we going continue with the wound care measures as before using wet-to-dry dressings Santyl over the smaller wound in the left ischial location. 2. I am also can recommend that the patient continue with appropriate and aggressive offloading I think that still can be of utmost importance. 3. We will discontinue the wound VAC at this point. We will see patient back for reevaluation in 3 weeks here in the clinic. If anything worsens or changes patient will contact our office for additional recommendations. Electronic Signature(s) Signed: 11/30/2021 2:56:49 PM By: Worthy Keeler PA-C Entered By: Worthy Keeler on 11/30/2021  14:56:49 -------------------------------------------------------------------------------- SuperBill Details Patient Name: Date of Service: [name] 11/30/2021 Medical Record Number: [id number] Patient Account Number: [id number] Date of Birth/Sex: Treating RN: 09-Oct-1991 (31 y.o. 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, other site Facility Procedures CPT4 Code: [id number] Description: [place] VISIT-LEV 3 EST PT Modifier: Quantity: 1 Physician Procedures : CPT4 Code Description Modifier [id number] - WC PHYS LEVEL 3 - EST PT ICD-10 Diagnosis Description L89.314 Pressure ulcer of right buttock, stage 4 L89.324 Pressure ulcer of left buttock, stage 4 L89.223 Pressure ulcer of left hip, stage 3 G82.21  Paraplegia, complete Quantity: 1 Electronic Signature(s) Signed: 12/01/2021 5:42:44 PM By: Baruch Gouty RN, BSN Signed: 12/07/2021 5:05:54 PM By: Worthy Keeler PA-C Previous Signature: 11/30/2021 2:57:06 PM Version By: Worthy Keeler PA-C Entered By: Baruch Gouty on 12/01/2021 16:00:41

## 2021-12-02 ENCOUNTER — Encounter: Payer: Self-pay | Admitting: Physical Medicine and Rehabilitation

## 2021-12-02 ENCOUNTER — Other Ambulatory Visit: Payer: Self-pay

## 2021-12-02 ENCOUNTER — Encounter
Payer: Medicaid Other | Attending: Physical Medicine and Rehabilitation | Admitting: Physical Medicine and Rehabilitation

## 2021-12-02 VITALS — BP 134/76 | HR 73 | Temp 98.9°F | Ht 72.0 in | Wt 130.0 lb

## 2021-12-02 DIAGNOSIS — Z79891 Long term (current) use of opiate analgesic: Secondary | ICD-10-CM | POA: Insufficient documentation

## 2021-12-02 DIAGNOSIS — G8220 Paraplegia, unspecified: Secondary | ICD-10-CM | POA: Diagnosis not present

## 2021-12-02 DIAGNOSIS — Z5181 Encounter for therapeutic drug level monitoring: Secondary | ICD-10-CM | POA: Diagnosis not present

## 2021-12-02 DIAGNOSIS — M898X9 Other specified disorders of bone, unspecified site: Secondary | ICD-10-CM | POA: Diagnosis present

## 2021-12-02 DIAGNOSIS — Z993 Dependence on wheelchair: Secondary | ICD-10-CM | POA: Diagnosis present

## 2021-12-02 DIAGNOSIS — G894 Chronic pain syndrome: Secondary | ICD-10-CM | POA: Insufficient documentation

## 2021-12-02 MED ORDER — TRAMADOL HCL 50 MG PO TABS: 100.0000 mg | ORAL_TABLET | Freq: Four times a day (QID) | ORAL | 5 refills | Status: DC

## 2021-12-02 MED ORDER — TIZANIDINE HCL 4 MG PO TABS: 8.0000 mg | ORAL_TABLET | Freq: Two times a day (BID) | ORAL | 5 refills | Status: DC

## 2021-12-02 MED ORDER — OXYCODONE HCL 10 MG PO TABS: 10.0000 mg | ORAL_TABLET | Freq: Four times a day (QID) | ORAL | 0 refills | Status: DC | PRN
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
## 2021-12-02 NOTE — Patient Instructions (Signed)
Pt is a 31 yr old male with T10 ASIA A paraplegia, neurogenic bowel and bladder and back pain here  For f/u- also had DVT and Pressure ulcer- sacrum- Heels have healed. 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 Here for f/u on SCI/paraplegia.    Will refer to Ortho for L hip severe heterotopic ossification. Placed referral to Dr Josefine Class at [facility]- who did surgery for another of my patients 1 year ago.   2. Spasticity-  Will restart Zanaflex 8 mg 2x/day- #60 tabs and 5 refills- suggestion- start at 4 mg (1 tab) 2x/day for 3-7 days, then increase to 2 tabs 2x/day.   3. Con't Baclofen 40 mg 4x/day for spasticity   4. Went over NEEDs to get him cathing- increase risk of UTIs to not cath; uses condom catheter- but also long term (?>10 years) increases risk of being put on dialysis.    5. Will renew Oxycodone 10 mg q6 hours prn #120 and Tramadol 100 mg QID #240- 5 refills.  Discussed need to stick to opiate contract requirements.   6. Will wait on on Urology appt with Dr Hall Busing in [place] for now per pt request- but said he will start cathing.    7. F/U in 3- 19months double appt- to see how doing on paraplegia-

## 2021-12-06 NOTE — Progress Notes (Signed)
MOE, GRACA ([id number]) Visit Report for 11/30/2021 Arrival Information Details Patient Name: Date of Service: [name], [name] 11/30/2021 2:15 PM Medical Record Number: [id number] Patient Account Number: [id number] Date of Birth/Sex: Treating RN: 05-25-1991 (31 y.o. [name] Primary Care Apoorva Bugay: Orion Crook Other Clinician: Referring Terease Marcotte: Treating Reshma Hoey/Extender: [name] in Treatment: 54 Visit Information History Since Last Visit Added or deleted any medications: No Patient Arrived: Wheel Chair Any new allergies or adverse reactions: No Arrival Time: 14:13 Had [name] fall or experienced change in No Accompanied By: girlfriend activities of daily living that may affect Transfer Assistance: Manual risk of falls: Patient Identification Verified: Yes Signs or symptoms of abuse/neglect since last visito No Secondary Verification Process Completed: Yes Hospitalized since last visit: Yes Patient Requires Transmission-Based Precautions: No Implantable device outside of the clinic excluding No Patient Has Alerts: No cellular tissue based products placed in the center since last visit: Has Dressing in Place as Prescribed: Yes Pain Present Now: No Electronic Signature(s) Signed: 12/06/2021 3:01:20 PM By: [name] Ito Entered By: [name] Ito on 11/30/2021 14:19:58 -------------------------------------------------------------------------------- Clinic Level of Care Assessment Details Patient Name: Date of Service: [name] 11/30/2021 2:15 PM Medical Record Number: [id number] Patient Account Number: [id number] Date of Birth/Sex: Treating RN: Dec 31, 1990 (31 y.o. [name] Primary Care Cosmo Tetreault: Orion Crook Other Clinician: Referring Gabriellah Rabel: Treating Khanh Cordner/Extender: [name] in Treatment: 69 Clinic Level of Care Assessment Items TOOL 4 Quantity Score []  - 0 Use  when only an EandM is performed on FOLLOW-UP visit ASSESSMENTS - Nursing Assessment / Reassessment X- 1 10 Reassessment of Co-morbidities (includes updates in patient status) X- 1 5 Reassessment of Adherence to Treatment Plan ASSESSMENTS - Wound and Skin [name] ssessment / Reassessment []  - 0 Simple Wound Assessment / Reassessment - one wound X- 2 5 Complex Wound Assessment / Reassessment - multiple wounds []  - 0 Dermatologic / Skin Assessment (not related to wound area) ASSESSMENTS - Focused Assessment []  - 0 Circumferential Edema Measurements - multi extremities []  - 0 Nutritional Assessment / Counseling / Intervention []  - 0 Lower Extremity Assessment (monofilament, tuning fork, pulses) []  - 0 Peripheral Arterial Disease Assessment (using hand held doppler) ASSESSMENTS - Ostomy and/or Continence Assessment and Care []  - 0 Incontinence Assessment and Management []  - 0 Ostomy Care Assessment and Management (repouching, etc.) PROCESS - Coordination of Care X - Simple Patient / Family Education for ongoing care 1 15 []  - 0 Complex (extensive) Patient / Family Education for ongoing care X- 1 10 Staff obtains Consents, Records, T Results / Process Orders est []  - 0 Staff telephones HHA, Nursing Homes / Clarify orders / etc []  - 0 Routine Transfer to another Facility (non-emergent condition) []  - 0 Routine Hospital Admission (non-emergent condition) []  - 0 New Admissions / / Ordering NPWT Apligraf, etc. , []  - 0 Emergency Hospital Admission (emergent condition) X- 1 10 Simple Discharge Coordination []  - 0 Complex (extensive) Discharge Coordination PROCESS - Special Needs []  - 0 Pediatric / Minor Patient Management []  - 0 Isolation Patient Management []  - 0 Hearing / Language / Visual special needs []  - 0 Assessment of Community assistance (transportation, D/C planning, etc.) []  - 0 Additional assistance / Altered mentation []  - 0 Support  Surface(s) Assessment (bed, cushion, seat, etc.) INTERVENTIONS - Wound Cleansing / Measurement []  - 0 Simple Wound Cleansing - one wound X- 2 5 Complex Wound Cleansing - multiple  wounds X- 1 5 Wound Imaging (photographs - any number of wounds) []  - 0 Wound Tracing (instead of photographs) []  - 0 Simple Wound Measurement - one wound X- 2 5 Complex Wound Measurement - multiple wounds INTERVENTIONS - Wound Dressings X - Small Wound Dressing one or multiple wounds 2 10 []  - 0 Medium Wound Dressing one or multiple wounds []  - 0 Large Wound Dressing one or multiple wounds X- 1 5 Application of Medications - topical []  - 0 Application of Medications - injection INTERVENTIONS - Miscellaneous []  - 0 External ear exam []  - 0 Specimen Collection (cultures, biopsies, blood, body fluids, etc.) []  - 0 Specimen(s) / Culture(s) sent or taken to Lab for analysis []  - 0 Patient Transfer (multiple staff / Nurse, adultHoyer Lift / Similar devices) []  - 0 Simple Staple / Suture removal (25 or less) []  - 0 Complex Staple / Suture removal (26 or more) []  - 0 Hypo / Hyperglycemic Management (close monitor of Blood Glucose) []  - 0 Ankle / Brachial Index (ABI) - do not check if billed separately X- 1 5 Vital Signs Has the patient been seen at the hospital within the last three years: Yes Total Score: 115 Level Of Care: New/Established - Level 3 Electronic Signature(s) Signed: 11/30/2021 5:43:10 PM By: Zenaida DeedBoehlein, Linda RN, BSN Entered By: Zenaida DeedBoehlein, Linda on 11/30/2021 15:04:29 -------------------------------------------------------------------------------- Lower Extremity Assessment Details Patient Name: Date of Service: [name], [name] 11/30/2021 2:15 PM Medical Record Number: [id number] Patient Account Number: [id number] Date of Birth/Sex: Treating RN: 06/10/1991 (31 y.o. [name]) Boehlein, Linda Primary Care Krithik Mapel: Orion CrookPassmore, Tewana Other Clinician: Referring Million Maharaj: Treating Gladiola Madore/Extender:  Angus PalmsStone III, Hoyt [name], [name] Weeks in Treatment: 5969 Electronic Signature(s) Signed: 11/30/2021 5:43:10 PM By: Zenaida DeedBoehlein, Linda RN, BSN Entered By: Zenaida DeedBoehlein, Linda on 11/30/2021 14:40:51 -------------------------------------------------------------------------------- Multi-Disciplinary Care Plan Details Patient Name: Date of Service: [name] RegulusRUSSELL, [name] 11/30/2021 2:15 PM Medical Record Number: [id number] Patient Account Number: [id number] Date of Birth/Sex: Treating RN: 04/17/1991 (30 y.o. [name]) Boehlein, Linda Primary Care Logen Fowle: Orion CrookPassmore, Tewana Other Clinician: Referring Nadene Witherspoon: Treating Beryl Balz/Extender: Albertine GratesStone III, Hoyt [name], [name] Weeks in Treatment: 69 Multidisciplinary Care Plan reviewed with physician Active Inactive Pressure Nursing Diagnoses: Knowledge deficit related to causes and risk factors for pressure ulcer development Goals: Patient/caregiver will verbalize risk factors for pressure ulcer development Date Initiated: 07/30/2020 Target Resolution Date: 12/14/2021 Goal Status: Active Interventions: Provide education on pressure ulcers Notes: 06/08/21: Patient recently had new pressure areas, target date extended. Wound/Skin Impairment Nursing Diagnoses: Impaired tissue integrity Goals: Patient/caregiver will verbalize understanding of skin care regimen Date Initiated: 01/05/2021 Target Resolution Date: 12/14/2021 Goal Status: Active Ulcer/skin breakdown will have [name] volume reduction of 50% by week 8 Date Initiated: 07/30/2020 Date Inactivated: 10/20/2020 Target Resolution Date: 09/27/2020 Goal Status: Unmet Unmet Reason: Infection Ulcer/skin breakdown will have [name] volume reduction of 80% by week 12 Date Initiated: 10/20/2020 Date Inactivated: 12/22/2020 Target Resolution Date: 11/19/2020 Unmet Reason: paraplegic, difficulty Goal Status: Unmet offloading Interventions: Provide education on ulcer and skin care Notes: 06/08/21: wound care ongoing. Electronic  Signature(s) Signed: 11/30/2021 5:43:10 PM By: Zenaida DeedBoehlein, Linda RN, BSN Entered By: Zenaida DeedBoehlein, Linda on 11/30/2021 14:50:03 -------------------------------------------------------------------------------- Pain Assessment Details Patient Name: Date of Service: [name] RegulusRUSSELL, [name] 11/30/2021 2:15 PM Medical Record Number: [id number] Patient Account Number: [id number] Date of Birth/Sex: Treating RN: 01/23/1991 (30 y.o. [name]) Boehlein, Linda Primary Care Nashia Remus: Orion CrookPassmore, Tewana Other Clinician: Referring Labrisha Wuellner: Treating Emarion Toral/Extender: Albertine GratesStone III, Hoyt [name], [name] Weeks in Treatment: 6169 Active Problems Location of  Pain Severity and Description of Pain Patient Has Paino No Site Locations Pain Management and Medication Current Pain Management: Electronic Signature(s) Signed: 11/30/2021 5:43:10 PM By: Zenaida Deed RN, BSN Signed: 12/06/2021 3:01:20 PM By: [name] Ito Entered By: [name] Ito on 11/30/2021 14:20:43 -------------------------------------------------------------------------------- Patient/Caregiver Education Details Patient Name: Date of Service: [name] 1/4/2023andnbsp2:15 PM Medical Record Number: [id number] Patient Account Number: [id number] Date of Birth/Gender: Treating RN: [birth year] (30 y.o. [name] Primary Care Physician: Orion Crook Other Clinician: Referring Physician: Treating Physician/Extender: [name] in Treatment: 80 Education Assessment Education Provided To: Patient Education Topics Provided Pressure: Methods: Explain/Verbal Responses: Reinforcements needed, State content correctly Wound/Skin Impairment: Methods: Explain/Verbal Responses: Reinforcements needed, State content correctly Electronic Signature(s) Signed: 11/30/2021 5:43:10 PM By: Zenaida Deed RN, BSN Entered By: Zenaida Deed on 11/30/2021  14:50:22 -------------------------------------------------------------------------------- Wound Assessment Details Patient Name: Date of Service: [name] March L. 11/30/2021 2:15 PM Medical Record Number: [id number] Patient Account Number: [id number] Date of Birth/Sex: Treating RN: Jan 08, 1991 (30 y.o. [name] Primary Care Medina Degraffenreid: Orion Crook Other Clinician: Referring Tiannah Greenly: Treating Tova Vater/Extender: Angus Palms Weeks in Treatment: 69 Wound Status Wound Number: 6 Primary Etiology: Pressure Ulcer Wound Location: Right Ischium Wound Status: Open Wounding Event: Trauma Comorbid History: Paraplegia Date Acquired: 02/10/2021 Weeks Of Treatment: 41 Clustered Wound: No Photos Wound Measurements Length: (cm) 4.2 Width: (cm) 1.8 Depth: (cm) 1.5 Area: (cm) 5.938 Volume: (cm) 8.906 % Reduction in Area: 61.2% % Reduction in Volume: -481.3% Epithelialization: Small (1-33%) Tunneling: Yes Position (o'clock): 12 Maximum Distance: (cm) 1.4 Undermining: No Wound Description Classification: Category/Stage IV Wound Margin: Well defined, not attached Exudate Amount: Medium Exudate Type: Serosanguineous Exudate Color: red, brown Foul Odor After Cleansing: No Slough/Fibrino Yes Wound Bed Granulation Amount: Large (67-100%) Exposed Structure Granulation Quality: Red Fascia Exposed: No Necrotic Amount: None Present (0%) Fat Layer (Subcutaneous Tissue) Exposed: Yes Tendon Exposed: No Muscle Exposed: No Joint Exposed: No Bone Exposed: No Electronic Signature(s) Signed: 11/30/2021 5:43:10 PM By: Zenaida Deed RN, BSN Entered By: Zenaida Deed on 11/30/2021 14:42:28 -------------------------------------------------------------------------------- Wound Assessment Details Patient Name: Date of Service: [name] March L. 11/30/2021 2:15 PM Medical Record Number: [id number] Patient Account Number: [id number] Date of  Birth/Sex: Treating RN: [birth year] (30 y.o. [name] Primary Care Mazie Fencl: Orion Crook Other Clinician: Referring Kashara Blocher: Treating Karlis Cregg/Extender: Angus Palms Weeks in Treatment: 69 Wound Status Wound Number: 8 Primary Etiology: Pressure Ulcer Wound Location: Left Trochanter Wound Status: Open Wounding Event: Pressure Injury Comorbid History: Paraplegia Date Acquired: 05/09/2021 Weeks Of Treatment: 28 Clustered Wound: No Photos Wound Measurements Length: (cm) Width: (cm) Depth: (cm) Area: (cm) Volume: (cm) 0 % Reduction in Area: 100% 0 % Reduction in Volume: 100% 0 Epithelialization: Medium (34-66%) 0 0 Wound Description Classification: Category/Stage IV Wound Margin: Distinct, outline attached Exudate Amount: Medium Exudate Type: Serous Exudate Color: amber Foul Odor After Cleansing: No Slough/Fibrino No Wound Bed Granulation Amount: Large (67-100%) Exposed Structure Granulation Quality: Red, Pink Fascia Exposed: No Necrotic Amount: None Present (0%) Fat Layer (Subcutaneous Tissue) Exposed: Yes Tendon Exposed: No Muscle Exposed: No Joint Exposed: No Bone Exposed: No Electronic Signature(s) Signed: 11/30/2021 5:43:10 PM By: Zenaida Deed RN, BSN Signed: 12/06/2021 3:01:20 PM By: [name] Ito Entered By: [name] Ito on 11/30/2021 14:25:31 -------------------------------------------------------------------------------- Wound Assessment Details Patient Name: Date of Service: [name] March L. 11/30/2021 2:15 PM Medical Record Number: [id number] Patient Account Number: [id number] Date of Birth/Sex: Treating RN: [birth year] (  31 y.o. [name]) Boehlein, Linda Primary Care Charisse Wendell: Orion CrookPassmore, Tewana Other Clinician: Referring Kimmy Parish: Treating Breniyah Romm/Extender: Angus PalmsStone III, Hoyt [name], [name] Weeks in Treatment: 69 Wound Status Wound Number: 9 Primary Etiology: Pressure Ulcer Wound Location: Left Ischium Wound  Status: Open Wounding Event: Pressure Injury Comorbid History: Paraplegia Date Acquired: 05/09/2021 Weeks Of Treatment: 28 Clustered Wound: No Photos Wound Measurements Length: (cm) 1.8 Width: (cm) 1.5 Depth: (cm) 0.2 Area: (cm) 2.121 Volume: (cm) 0.424 % Reduction in Area: 6.6% % Reduction in Volume: -86.8% Epithelialization: Small (1-33%) Tunneling: No Undermining: No Wound Description Classification: Category/Stage III Wound Margin: Distinct, outline attached Exudate Amount: Medium Exudate Type: Serous Exudate Color: amber Foul Odor After Cleansing: No Slough/Fibrino Yes Wound Bed Granulation Amount: Large (67-100%) Exposed Structure Granulation Quality: Red Fascia Exposed: No Necrotic Amount: Small (1-33%) Fat Layer (Subcutaneous Tissue) Exposed: Yes Necrotic Quality: Adherent Slough Tendon Exposed: No Muscle Exposed: No Joint Exposed: No Bone Exposed: No Electronic Signature(s) Signed: 11/30/2021 5:43:10 PM By: Zenaida DeedBoehlein, Linda RN, BSN Entered By: Zenaida DeedBoehlein, Linda on 11/30/2021 14:42:58 -------------------------------------------------------------------------------- Vitals Details Patient Name: Date of Service: [name] MarchUSSELL, [name] 11/30/2021 2:15 PM Medical Record Number: [id number] Patient Account Number: [id number] Date of Birth/Sex: Treating RN: 10/11/1991 (30 y.o. [name]) Boehlein, Linda Primary Care Afrika Brick: Orion CrookPassmore, Tewana Other Clinician: Referring Malahki Gasaway: Treating Sanja Elizardo/Extender: Albertine GratesStone III, Hoyt [name], [name] Weeks in Treatment: 2269 Vital Signs Time Taken: 14:20 Temperature (F): 98.9 Height (in): 71 Pulse (bpm): 78 Weight (lbs): 136 Respiratory Rate (breaths/min): 16 Body Mass Index (BMI): 19 Blood Pressure (mmHg): 139/76 Reference Range: 80 - 120 mg / dl Electronic Signature(s) Signed: 12/06/2021 3:01:20 PM By: [name] Itoawkins, Destiny Entered By: [name] Itoawkins, Destiny on 11/30/2021 14:20:26

## 2021-12-07 LAB — DRUG TOX MONITOR 1 W/CONF, ORAL FLD
Amphetamines: NEGATIVE ng/mL (ref <10)
Barbiturates: NEGATIVE ng/mL (ref <10)
Benzodiazepines: NEGATIVE ng/mL (ref <0.50)
Buprenorphine: NEGATIVE ng/mL (ref <0.10)
Cocaine: NEGATIVE ng/mL (ref <5.0)
Cotinine: 230.6 ng/mL — ABNORMAL HIGH (ref <5.0)
Fentanyl: NEGATIVE ng/mL (ref <0.10)
Heroin Metabolite: NEGATIVE ng/mL (ref <1.0)
MARIJUANA: POSITIVE ng/mL — AB (ref <2.5)
MDMA: NEGATIVE ng/mL (ref <10)
Meprobamate: NEGATIVE ng/mL (ref <2.5)
Methadone: NEGATIVE ng/mL (ref <5.0)
Nicotine Metabolite: POSITIVE ng/mL — AB (ref <5.0)
Opiates: NEGATIVE ng/mL (ref <2.5)
Phencyclidine: NEGATIVE ng/mL (ref <10)
THC: 59.7 ng/mL — ABNORMAL HIGH (ref <2.5)
Tapentadol: NEGATIVE ng/mL (ref <5.0)
Tramadol: >500 ng/mL — ABNORMAL HIGH (ref <5.0)
Tramadol: POSITIVE ng/mL — AB (ref <5.0)
Zolpidem: NEGATIVE ng/mL (ref <5.0)

## 2021-12-07 LAB — DRUG TOX ALC METAB W/CON, ORAL FLD: Alcohol Metabolite: NEGATIVE ng/mL (ref <25)

## 2021-12-12 ENCOUNTER — Ambulatory Visit: Payer: Self-pay | Admitting: Nurse Practitioner

## 2021-12-12 ENCOUNTER — Telehealth: Payer: Self-pay | Admitting: *Deleted

## 2021-12-12 NOTE — Telephone Encounter (Signed)
Oral swab is positive for prescribed Tramadol as expected. Reported he had not taken oxycodone in a month so it is expected to be negative. Unfortunately he is positive again for marijuana. He received a warning letter about this 12/10/20.

## 2021-12-21 ENCOUNTER — Encounter (HOSPITAL_BASED_OUTPATIENT_CLINIC_OR_DEPARTMENT_OTHER): Payer: Medicaid Other | Admitting: Physician Assistant

## 2021-12-23 ENCOUNTER — Ambulatory Visit: Payer: Medicaid Other | Admitting: Gastroenterology

## 2021-12-28 ENCOUNTER — Encounter (HOSPITAL_BASED_OUTPATIENT_CLINIC_OR_DEPARTMENT_OTHER): Payer: Medicaid Other | Admitting: Physician Assistant

## 2022-01-04 ENCOUNTER — Other Ambulatory Visit: Payer: Self-pay

## 2022-01-04 ENCOUNTER — Encounter (HOSPITAL_BASED_OUTPATIENT_CLINIC_OR_DEPARTMENT_OTHER): Payer: Medicaid Other | Attending: Physician Assistant | Admitting: Physician Assistant

## 2022-01-04 DIAGNOSIS — L89610 Pressure ulcer of right heel, unstageable: Secondary | ICD-10-CM | POA: Insufficient documentation

## 2022-01-04 DIAGNOSIS — L89223 Pressure ulcer of left hip, stage 3: Secondary | ICD-10-CM | POA: Insufficient documentation

## 2022-01-04 DIAGNOSIS — M8668 Other chronic osteomyelitis, other site: Secondary | ICD-10-CM | POA: Insufficient documentation

## 2022-01-04 DIAGNOSIS — G8221 Paraplegia, complete: Secondary | ICD-10-CM | POA: Diagnosis not present

## 2022-01-04 DIAGNOSIS — L89324 Pressure ulcer of left buttock, stage 4: Secondary | ICD-10-CM | POA: Insufficient documentation

## 2022-01-04 DIAGNOSIS — E43 Unspecified severe protein-calorie malnutrition: Secondary | ICD-10-CM | POA: Diagnosis not present

## 2022-01-04 DIAGNOSIS — L89314 Pressure ulcer of right buttock, stage 4: Secondary | ICD-10-CM | POA: Diagnosis present

## 2022-01-04 NOTE — Progress Notes (Signed)
[name], [name] ([id number]) Visit Report for 01/04/2022 Chief Complaint Document Details Patient Name: Date of Service: [name], [name] 01/04/2022 1:30 PM Medical Record Number: [id number] Patient Account Number: [id number] Date of Birth/Sex: Treating RN: 23-May-1991 (30 y.o. [name] Primary Care Provider: Orion Crook Other Clinician: Referring Provider: Treating Provider/Extender: Roselyn Meier Weeks in Treatment: 71 Information Obtained from: Patient Chief Complaint 07/30/2020; patient is here for pressure ulcers x4 in the setting of recent T10-T11 paraplegia Electronic Signature(s) Signed: 01/04/2022 1:58:22 PM By: Lenda Kelp PA-C Entered By: Lenda Kelp on 01/04/2022 13:58:22 -------------------------------------------------------------------------------- Problem List Details Patient Name: Date of Service: [name], [name] NTO NIO L. 01/04/2022 1:30 PM Medical Record Number: [id number] Patient Account Number: [id number] Date of Birth/Sex: Treating RN: 03-May-1991 (30 y.o. [name] Primary Care Provider: Orion Crook Other Clinician: Referring Provider: Treating Provider/Extender: Roselyn Meier Weeks in Treatment: 83 Active Problems ICD-10 Encounter Code Description Active Date MDM Diagnosis L89.314 Pressure ulcer of right buttock, stage 4 07/30/2020 No Yes L89.324 Pressure ulcer of left buttock, stage 4 02/16/2021 No Yes L89.223 Pressure ulcer of left hip, stage 3 06/08/2021 No Yes E43 Unspecified severe protein-calorie malnutrition 07/30/2020 No Yes G82.21 Paraplegia, complete 07/30/2020 No Yes M86.68 Other chronic osteomyelitis, other site 09/10/2020 No Yes Inactive Problems Resolved Problems ICD-10 Code Description Active Date Resolved Date L89.610 Pressure ulcer of right heel, unstageable 07/30/2020 07/30/2020 L89.523 Pressure ulcer of left ankle, stage 3 07/30/2020 07/30/2020 L89.322 Pressure ulcer of left buttock,  stage 2 08/13/2020 08/13/2020 Electronic Signature(s) Signed: 01/04/2022 1:58:06 PM By: Lenda Kelp PA-C Entered By: Lenda Kelp on 01/04/2022 13:58:05

## 2022-01-05 NOTE — Progress Notes (Signed)
[name] ([id number]) Visit Report for 01/04/2022 Arrival Information Details Patient Name: Date of Service: [name], [name] 01/04/2022 1:30 PM Medical Record Number: [id number] Patient Account Number: [id number] Date of Birth/Sex: Treating RN: [birth year] (30 y.o. [name] Primary Care [name]: [name] Other Clinician: Referring [name]: Treating [name]/Extender: [name] in Treatment: 87 Visit Information History Since Last Visit Added or deleted any medications: No Patient Arrived: Wheel Chair Any new allergies or adverse reactions: No Arrival Time: 13:54 Had [name] fall or experienced change in No Accompanied By: self activities of daily living that may affect Transfer Assistance: Manual risk of falls: Patient Identification Verified: Yes Signs or symptoms of abuse/neglect since last visito No Secondary Verification Process Completed: Yes Hospitalized since last visit: No Patient Requires Transmission-Based Precautions: No Implantable device outside of the clinic excluding No Patient Has Alerts: No cellular tissue based products placed in the center since last visit: Has Dressing in Place as Prescribed: Yes Pain Present Now: No Electronic Signature(s) Signed: 01/04/2022 2:07:25 PM By: [name] Ito Entered By: [name] Ito on 01/04/2022 13:56:54 -------------------------------------------------------------------------------- Clinic Level of Care Assessment Details Patient Name: Date of Service: [name], [name] 01/04/2022 1:30 PM Medical Record Number: [id number] Patient Account Number: [id number] Date of Birth/Sex: Treating RN: 22-Feb-1991 (30 y.o. [name] Primary Care [name]: [name] Other Clinician: Referring [name]: Treating [name]/Extender: [name] [name] in Treatment: 34 Clinic Level of Care Assessment Items TOOL 4 Quantity Score X- 1 0 Use when only  an EandM is performed on FOLLOW-UP visit ASSESSMENTS - Nursing Assessment / Reassessment X- 1 10 Reassessment of Co-morbidities (includes updates in patient status) X- 1 5 Reassessment of Adherence to Treatment Plan ASSESSMENTS - Wound and Skin [name] ssessment / Reassessment []  - 0 Simple Wound Assessment / Reassessment - one wound X- 2 5 Complex Wound Assessment / Reassessment - multiple wounds X- 1 10 Dermatologic / Skin Assessment (not related to wound area) ASSESSMENTS - Focused Assessment []  - 0 Circumferential Edema Measurements - multi extremities X- 1 10 Nutritional Assessment / Counseling / Intervention []  - 0 Lower Extremity Assessment (monofilament, tuning fork, pulses) []  - 0 Peripheral Arterial Disease Assessment (using hand held doppler) ASSESSMENTS - Ostomy and/or Continence Assessment and Care []  - 0 Incontinence Assessment and Management []  - 0 Ostomy Care Assessment and Management (repouching, etc.) PROCESS - Coordination of Care []  - 0 Simple Patient / Family Education for ongoing care X- 1 20 Complex (extensive) Patient / Family Education for ongoing care X- 1 10 Staff obtains , Records, T Results / Process Orders est []  - 0 Staff telephones HHA, Nursing Homes / Clarify orders / etc []  - 0 Routine Transfer to another Facility (non-emergent condition) []  - 0 Routine Hospital Admission (non-emergent condition) []  - 0 New Admissions / / Ordering NPWT Apligraf, etc. , []  - 0 Emergency Hospital Admission (emergent condition) []  - 0 Simple Discharge Coordination X- 1 15 Complex (extensive) Discharge Coordination PROCESS - Special Needs []  - 0 Pediatric / Minor Patient Management []  - 0 Isolation Patient Management []  - 0 Hearing / Language / Visual special needs []  - 0 Assessment of Community assistance (transportation, D/C planning, etc.) []  - 0 Additional assistance / Altered mentation []  - 0 Support Surface(s)  Assessment (bed, cushion, seat, etc.) INTERVENTIONS - Wound Cleansing / Measurement []  - 0 Simple Wound Cleansing - one wound X- 2 5 Complex Wound Cleansing - multiple wounds  X- 1 5 Wound Imaging (photographs - any number of wounds) []  - 0 Wound Tracing (instead of photographs) []  - 0 Simple Wound Measurement - one wound X- 2 5 Complex Wound Measurement - multiple wounds INTERVENTIONS - Wound Dressings X - Small Wound Dressing one or multiple wounds 1 10 X- 1 15 Medium Wound Dressing one or multiple wounds []  - 0 Large Wound Dressing one or multiple wounds []  - 0 Application of Medications - topical []  - 0 Application of Medications - injection INTERVENTIONS - Miscellaneous []  - 0 External ear exam []  - 0 Specimen Collection (cultures, biopsies, blood, body fluids, etc.) []  - 0 Specimen(s) / Culture(s) sent or taken to Lab for analysis []  - 0 Patient Transfer (multiple staff / Nurse, adult / Similar devices) []  - 0 Simple Staple / Suture removal (25 or less) []  - 0 Complex Staple / Suture removal (26 or more) []  - 0 Hypo / Hyperglycemic Management (close monitor of Blood Glucose) []  - 0 Ankle / Brachial Index (ABI) - do not check if billed separately X- 1 5 Vital Signs Has the patient been seen at the hospital within the last three years: Yes Total Score: 145 Level Of Care: New/Established - Level 4 Electronic Signature(s) Signed: 01/04/2022 5:26:44 PM By: [name] Stall RN, BSN Entered By: [name] on 01/04/2022 17:25:23 -------------------------------------------------------------------------------- Encounter Discharge Information Details Patient Name: Date of Service: [name] March L. 01/04/2022 1:30 PM Medical Record Number: [id number] Patient Account Number: [id number] Date of Birth/Sex: Treating RN: [birth year] (30 y.o. [name] Primary Care [name]: [name] Other Clinician: Referring [name]: Treating [name]/Extender: [name] in Treatment: 54 Encounter Discharge Information Items Discharge Condition: Stable Ambulatory Status: Wheelchair Discharge Destination: Home Transportation: Private Auto Accompanied By: self Schedule Follow-up Appointment: Yes Clinical Summary of Care: Electronic Signature(s) Signed: 01/04/2022 5:26:44 PM By: [name] Stall RN, BSN Entered By: [name] on 01/04/2022 17:26:10 -------------------------------------------------------------------------------- Lower Extremity Assessment Details Patient Name: Date of Service: [name], [name] NIO L. 01/04/2022 1:30 PM Medical Record Number: [id number] Patient Account Number: [id number] Date of Birth/Sex: Treating RN: February 22, 1991 (30 y.o. [name] Primary Care [place] Dani: [name] Other Clinician: Referring Hazelgrace Bonham: Treating Myrella Fahs/Extender: [name] [name] in Treatment: 67 Electronic Signature(s) Signed: 01/04/2022 2:28:14 PM By: Antonieta Iba Entered By: Antonieta Iba on 01/04/2022 14:28:14 -------------------------------------------------------------------------------- Multi-Disciplinary Care Plan Details Patient Name: Date of Service: [name], [name] NIO L. 01/04/2022 1:30 PM Medical Record Number: [id number] Patient Account Number: [id number] Date of Birth/Sex: Treating RN: 08-Nov-1991 (30 y.o. [name] Primary Care Marwan Lipe: [name] Other Clinician: Referring Jakory Matsuo: Treating Lattie Cervi/Extender: [name] in Treatment: 27 Multidisciplinary Care Plan reviewed with physician Active Inactive Pressure Nursing Diagnoses: Knowledge deficit related to causes and risk factors for pressure ulcer development Goals: Patient/caregiver will verbalize risk factors for pressure ulcer development Date Initiated: 07/30/2020 Target Resolution Date: 01/27/2022 Goal Status: Active Interventions: Provide education on pressure  ulcers Notes: 06/08/21: Patient recently had new pressure areas, target date extended. Wound/Skin Impairment Nursing Diagnoses: Impaired tissue integrity Goals: Patient/caregiver will verbalize understanding of skin care regimen Date Initiated: 01/05/2021 Target Resolution Date: 01/27/2022 Goal Status: Active Ulcer/skin breakdown will have [name] volume reduction of 50% by week 8 Date Initiated: 07/30/2020 Date Inactivated: 10/20/2020 Target Resolution Date: 09/27/2020 Goal Status: Unmet Unmet Reason: Infection Ulcer/skin breakdown will have [name] volume reduction of 80% by week 12 Date Initiated: 10/20/2020 Date Inactivated: 12/22/2020 Target Resolution Date: 11/19/2020  Unmet Reason: paraplegic, difficulty Goal Status: Unmet offloading Interventions: Provide education on ulcer and skin care Notes: 06/08/21: wound care ongoing. Electronic Signature(s) Signed: 01/04/2022 5:26:44 PM By: [name] Stall RN, BSN Entered By: [name] on 01/04/2022 14:53:04 -------------------------------------------------------------------------------- Pain Assessment Details Patient Name: Date of Service: [name], [name] NIO L. 01/04/2022 1:30 PM Medical Record Number: [id number] Patient Account Number: [id number] Date of Birth/Sex: Treating RN: July 28, 1991 (30 y.o. [name] Primary Care Chayah Mckee: [name] Other Clinician: Referring Lorenza Shakir: Treating Lakeva Hollon/Extender: [name] [name] in Treatment: 20 Active Problems Location of Pain Severity and Description of Pain Patient Has Paino No Site Locations Pain Management and Medication Current Pain Management: Electronic Signature(s) Signed: 01/04/2022 2:07:25 PM By: [name] Ito Signed: 01/04/2022 4:24:10 PM By: Antonieta Iba Entered By: [name] Ito on 01/04/2022 13:57:46 -------------------------------------------------------------------------------- Patient/Caregiver Education Details Patient Name: Date of  Service: [name] 2/8/2023andnbsp1:30 PM Medical Record Number: [id number] Patient Account Number: [id number] Date of Birth/Gender: Treating RN: Jan 22, 1991 (30 y.o. [name] Primary Care Physician: [name] Other Clinician: Referring Physician: Treating Physician/Extender: [name] in Treatment: 46 Education Assessment Education Provided To: Patient Education Topics Provided Wound/Skin Impairment: Handouts: Skin Care Do's and Dont's Methods: Explain/Verbal Responses: Reinforcements needed Electronic Signature(s) Signed: 01/04/2022 5:26:44 PM By: [name] Stall RN, BSN Entered By: [name] on 01/04/2022 14:56:38 -------------------------------------------------------------------------------- Wound Assessment Details Patient Name: Date of Service: [name] March L. 01/04/2022 1:30 PM Medical Record Number: [id number] Patient Account Number: [id number] Date of Birth/Sex: Treating RN: 1991-04-20 (30 y.o. [name] Primary Care Terese Heier: [name] Other Clinician: Referring Eann Cleland: Treating Hamdi Vari/Extender: [name] [name] in Treatment: 72 Wound Status Wound Number: 6 Primary Etiology: Pressure Ulcer Wound Location: Right Ischium Wound Status: Open Wounding Event: Trauma Comorbid History: Paraplegia Date Acquired: 02/10/2021 [name] Of Treatment: 46 Clustered Wound: No Photos Wound Measurements Length: (cm) 3.7 Width: (cm) 1.7 Depth: (cm) 1.1 Area: (cm) 4.94 Volume: (cm) 5.434 % Reduction in Area: 67.7% % Reduction in Volume: -254.7% Epithelialization: Small (1-33%) Tunneling: No Undermining: No Wound Description Classification: Category/Stage IV Wound Margin: Well defined, not attached Exudate Amount: Medium Exudate Type: Serosanguineous Exudate Color: red, brown Foul Odor After Cleansing: No Slough/Fibrino Yes Wound Bed Granulation Amount: Large (67-100%)  Exposed Structure Granulation Quality: Red Fascia Exposed: No Necrotic Amount: None Present (0%) Fat Layer (Subcutaneous Tissue) Exposed: Yes Tendon Exposed: No Muscle Exposed: No Joint Exposed: No Bone Exposed: No Treatment Notes Wound #6 (Ischium) Wound Laterality: Right Cleanser Soap and Water Discharge Instruction: May shower and wash wound with dial antibacterial soap and water prior to dressing change. Wound Cleanser Discharge Instruction: Cleanse the wound with wound cleanser prior to applying [name] clean dressing using gauze sponges, not tissue or cotton balls. Peri-Wound Care Topical Primary Dressing Woven Gauze Sponges, Sterile 4x4 (in/in) Discharge Instruction: moisten with saline and pack lightly into wound Secondary Dressing Zetuvit Plus Silicone Border Dressing 4x4 (in/in) Discharge Instruction: Apply silicone border over primary dressing as directed. Secured With Compression Wrap Compression Stockings Facilities manager) Signed: 01/04/2022 4:24:10 PM By: Antonieta Iba Signed: 01/05/2022 8:04:36 AM By: Redmond Pulling RN, BSN Previous Signature: 01/04/2022 2:07:25 PM Version By: [name] Ito Entered By: Redmond Pulling on 01/04/2022 14:28:07 -------------------------------------------------------------------------------- Wound Assessment Details Patient Name: Date of Service: [name], [name] 01/04/2022 1:30 PM Medical Record Number: [id number] Patient Account Number: [id number] Date of Birth/Sex: Treating RN: 11-13-1991 (30 y.o. [name] Primary Care Gaynor Ferreras: Fleeta Emmer,  Enid Derryewana Other Clinician: Referring Rilee Knoll: Treating Kirin Pastorino/Extender: [name] MeierStone III, Hoyt Passmore, Tewana [name] in Treatment: 4274 Wound Status Wound Number: 9 Primary Etiology: Pressure Ulcer Wound Location: Left Ischium Wound Status: Open Wounding Event: Pressure Injury Comorbid History: Paraplegia Date Acquired: 05/09/2021 [name] Of Treatment: 33 Clustered Wound:  No Photos Wound Measurements Length: (cm) 1.5 Width: (cm) 2 Depth: (cm) 0.5 Area: (cm) 2.356 Volume: (cm) 1.178 % Reduction in Area: -3.8% % Reduction in Volume: -418.9% Epithelialization: Small (1-33%) Tunneling: No Undermining: No Wound Description Classification: Category/Stage III Wound Margin: Distinct, outline attached Exudate Amount: Medium Exudate Type: Serous Exudate Color: amber Foul Odor After Cleansing: No Slough/Fibrino Yes Wound Bed Granulation Amount: Large (67-100%) Exposed Structure Granulation Quality: Red Fascia Exposed: No Necrotic Amount: Small (1-33%) Fat Layer (Subcutaneous Tissue) Exposed: Yes Necrotic Quality: Adherent Slough Tendon Exposed: No Muscle Exposed: No Joint Exposed: No Bone Exposed: No Treatment Notes Wound #9 (Ischium) Wound Laterality: Left Cleanser Soap and Water Discharge Instruction: May shower and wash wound with dial antibacterial soap and water prior to dressing change. Wound Cleanser Discharge Instruction: Cleanse the wound with wound cleanser prior to applying [name] clean dressing using gauze sponges, not tissue or cotton balls. Peri-Wound Care Topical Primary Dressing saline wet to dry Discharge Instruction: saline moisten gauze lightly packed into wound bed. Secondary Dressing Zetuvit Plus Silicone Border Dressing 4x4 (in/in) Discharge Instruction: Apply silicone border over primary dressing as directed. Secured With Compression Wrap Compression Stockings Facilities managerAdd-Ons Electronic Signature(s) Signed: 01/04/2022 4:24:10 PM By: Antonieta IbaBarnhart, Jodi Signed: 01/05/2022 8:04:36 AM By: Redmond PullingPalmer, Carrie RN, BSN Previous Signature: 01/04/2022 2:07:25 PM Version By: [name] Itoawkins, Destiny Entered By: Redmond PullingPalmer, Carrie on 01/04/2022 14:29:14 -------------------------------------------------------------------------------- Vitals Details Patient Name: Date of Service: [name], [name] 01/04/2022 1:30 PM Medical Record Number: [id number] Patient  Account Number: [id number] Date of Birth/Sex: Treating RN: 12/23/1990 (30 y.o. [name] MichaelsM) Barnhart, Jodi Primary Care Jenee Spaugh: [name] CrookPassmore, Tewana Other Clinician: Referring Rovena Hearld: Treating Ellington Greenslade/Extender: [name] MeierStone III, Hoyt Passmore, Tewana [name] in Treatment: 1374 Vital Signs Time Taken: 13:57 Temperature (F): 98.8 Height (in): 71 Pulse (bpm): 89 Weight (lbs): 136 Respiratory Rate (breaths/min): 16 Body Mass Index (BMI): 19 Blood Pressure (mmHg): 147/92 Reference Range: 80 - 120 mg / dl Electronic Signature(s) Signed: 01/04/2022 2:07:25 PM By: [name] Itoawkins, Destiny Entered By: [name] Itoawkins, Destiny on 01/04/2022 13:57:26

## 2022-01-10 ENCOUNTER — Other Ambulatory Visit: Payer: Self-pay | Admitting: Physical Medicine and Rehabilitation

## 2022-01-10 MED ORDER — OXYCODONE HCL 10 MG PO TABS: 10.0000 mg | ORAL_TABLET | Freq: Four times a day (QID) | ORAL | 0 refills | Status: DC | PRN

## 2022-01-25 ENCOUNTER — Encounter (HOSPITAL_BASED_OUTPATIENT_CLINIC_OR_DEPARTMENT_OTHER): Payer: Medicaid Other | Admitting: Physician Assistant

## 2022-02-01 ENCOUNTER — Encounter (HOSPITAL_BASED_OUTPATIENT_CLINIC_OR_DEPARTMENT_OTHER): Payer: Medicaid Other | Attending: Physician Assistant | Admitting: Physician Assistant

## 2022-02-01 ENCOUNTER — Other Ambulatory Visit: Payer: Self-pay

## 2022-02-01 DIAGNOSIS — L89324 Pressure ulcer of left buttock, stage 4: Secondary | ICD-10-CM | POA: Diagnosis not present

## 2022-02-01 DIAGNOSIS — L89223 Pressure ulcer of left hip, stage 3: Secondary | ICD-10-CM | POA: Diagnosis not present

## 2022-02-01 DIAGNOSIS — M8668 Other chronic osteomyelitis, other site: Secondary | ICD-10-CM | POA: Insufficient documentation

## 2022-02-01 DIAGNOSIS — L89314 Pressure ulcer of right buttock, stage 4: Secondary | ICD-10-CM | POA: Diagnosis present

## 2022-02-01 DIAGNOSIS — G8221 Paraplegia, complete: Secondary | ICD-10-CM | POA: Diagnosis not present

## 2022-02-01 DIAGNOSIS — E43 Unspecified severe protein-calorie malnutrition: Secondary | ICD-10-CM | POA: Insufficient documentation

## 2022-02-01 NOTE — Progress Notes (Addendum)
[name], [name] ([id number]) Visit Report for 02/01/2022 Arrival Information Details Patient Name: Date of Service: [name], [name] 02/01/2022 1:00 PM Medical Record Number: [id number] Patient Account Number: [id number] Date of Birth/Sex: Treating RN: 1990-12-21 (30 y.o. [name], [name] Primary Care Fontaine Hehl: Orion Crook Other Clinician: Referring Naziya Hegwood: Treating Lei Dower/Extender: Eilene Ghazi in Treatment: 68 Visit Information History Since Last Visit Added or deleted any medications: No Patient Arrived: Wheel Chair Any new allergies or adverse reactions: No Arrival Time: 13:17 Had a fall or experienced change in No Accompanied By: self activities of daily living that may affect Transfer Assistance: Manual risk of falls: Patient Identification Verified: Yes Signs or symptoms of abuse/neglect since last visito No Secondary Verification Process Completed: Yes Hospitalized since last visit: No Patient Requires Transmission-Based Precautions: No Has Dressing in Place as Prescribed: Yes Patient Has Alerts: No Pain Present Now: No Electronic Signature(s) Signed: 02/01/2022 4:18:19 PM By: Fonnie Mu RN Entered By: Fonnie Mu on 02/01/2022 13:17:59 -------------------------------------------------------------------------------- Encounter Discharge Information Details Patient Name: Date of Service: [name] March L. 02/01/2022 1:00 PM Medical Record Number: [id number] Patient Account Number: [id number] Date of Birth/Sex: Treating RN: Feb 21, 1991 (30 y.o. [name], [name] Primary Care Mavrik Bynum: Orion Crook Other Clinician: Referring Shawn Carattini: Treating Candi Profit/Extender: Roselyn Meier Weeks in Treatment: 41 Encounter Discharge Information Items Post Procedure Vitals Discharge Condition: Stable Temperature (F): 98.7 Ambulatory Status: Wheelchair Pulse (bpm): 74 Discharge Destination:  Home Respiratory Rate (breaths/min): 17 Transportation: Private Auto Blood Pressure (mmHg): 134/74 Accompanied By: self Schedule Follow-up Appointment: Yes Clinical Summary of Care: Patient Declined Electronic Signature(s) Signed: 02/01/2022 4:18:19 PM By: Fonnie Mu RN Entered By: Fonnie Mu on 02/01/2022 13:47:12 -------------------------------------------------------------------------------- Lower Extremity Assessment Details Patient Name: Date of Service: [name] March L. 02/01/2022 1:00 PM Medical Record Number: [id number] Patient Account Number: [id number] Date of Birth/Sex: Treating RN: [birth year] (30 y.o. [name] Primary Care Tatyanna Cronk: Orion Crook Other Clinician: Referring Lysandra Loughmiller: Treating Glynna Failla/Extender: Roselyn Meier Weeks in Treatment: 76 Electronic Signature(s) Signed: 02/01/2022 4:18:19 PM By: Fonnie Mu RN Entered By: Fonnie Mu on 02/01/2022 13:18:44 -------------------------------------------------------------------------------- Multi-Disciplinary Care Plan Details Patient Name: Date of Service: [name], [name] NIO L. 02/01/2022 1:00 PM Medical Record Number: [id number] Patient Account Number: [id number] Date of Birth/Sex: Treating RN: 1991-11-06 (30 y.o. [name] Primary Care Julaine Zimny: Orion Crook Other Clinician: Referring Junelle Hashemi: Treating Adelei Scobey/Extender: Eilene Ghazi in Treatment: 27 Multidisciplinary Care Plan reviewed with physician Active Inactive Electronic Signature(s) Signed: 04/17/2022 3:58:36 PM By: Fonnie Mu RN Previous Signature: 02/01/2022 4:18:19 PM Version By: Fonnie Mu RN Entered By: Fonnie Mu on 04/07/2022 14:51:11 -------------------------------------------------------------------------------- Pain Assessment Details Patient Name: Date of Service: [name], [name] NIO L. 02/01/2022 1:00 PM Medical Record  Number: [id number] Patient Account Number: [id number] Date of Birth/Sex: Treating RN: Apr 06, 1991 (30 y.o. [name] Primary Care Kirstyn Lean: Orion Crook Other Clinician: Referring Markevious Ehmke: Treating Tori Cupps/Extender: Roselyn Meier Weeks in Treatment: 78 Active Problems Location of Pain Severity and Description of Pain Patient Has Paino No Site Locations Pain Management and Medication Current Pain Management: Electronic Signature(s) Signed: 02/01/2022 4:18:19 PM By: Fonnie Mu RN Entered By: Fonnie Mu on 02/01/2022 13:18:38 -------------------------------------------------------------------------------- Patient/Caregiver Education Details Patient Name: Date of Service: [name] 3/8/2023andnbsp1:00 PM Medical Record Number: [id number] Patient Account Number: [id number] Date of Birth/Gender: Treating RN: January 29, 1991 (30 y.o. [name] Primary Care Physician: Orion Crook Other Clinician: Referring  Physician: Treating Physician/Extender: Eilene Ghazi in Treatment: 48 Education Assessment Education Provided To: Patient Education Topics Provided Pressure: Methods: Explain/Verbal Responses: Reinforcements needed, State content correctly Electronic Signature(s) Signed: 02/01/2022 4:18:19 PM By: Fonnie Mu RN Entered By: Fonnie Mu on 02/01/2022 13:46:20 -------------------------------------------------------------------------------- Wound Assessment Details Patient Name: Date of Service: [name] March L. 02/01/2022 1:00 PM Medical Record Number: [id number] Patient Account Number: [id number] Date of Birth/Sex: Treating RN: 07/29/1991 (30 y.o. [name], [name] Primary Care Cherrie Franca: Orion Crook Other Clinician: Referring Neelie Welshans: Treating Mylo Driskill/Extender: Roselyn Meier Weeks in Treatment: 39 Wound Status Wound Number: 6 Primary  Etiology: Pressure Ulcer Wound Location: Right Ischium Wound Status: Open Wounding Event: Trauma Comorbid History: Paraplegia Date Acquired: 02/10/2021 Weeks Of Treatment: 50 Clustered Wound: No Photos Wound Measurements Length: (cm) 3.4 Width: (cm) 1.5 Depth: (cm) 0.4 Area: (cm) 4.006 Volume: (cm) 1.602 % Reduction in Area: 73.8% % Reduction in Volume: -4.6% Epithelialization: Medium (34-66%) Tunneling: No Undermining: No Wound Description Classification: Category/Stage IV Wound Margin: Well defined, not attached Exudate Amount: Medium Exudate Type: Serosanguineous Exudate Color: red, brown Foul Odor After Cleansing: No Slough/Fibrino Yes Wound Bed Granulation Amount: Large (67-100%) Exposed Structure Granulation Quality: Red Fascia Exposed: No Necrotic Amount: None Present (0%) Fat Layer (Subcutaneous Tissue) Exposed: Yes Tendon Exposed: No Muscle Exposed: No Joint Exposed: No Bone Exposed: No Electronic Signature(s) Signed: 02/01/2022 4:18:19 PM By: Fonnie Mu RN Signed: 02/01/2022 4:44:26 PM By: Shawn Stall RN, BSN Entered By: Shawn Stall on 02/01/2022 13:28:36 -------------------------------------------------------------------------------- Wound Assessment Details Patient Name: Date of Service: [name] March L. 02/01/2022 1:00 PM Medical Record Number: [id number] Patient Account Number: [id number] Date of Birth/Sex: Treating RN: 09/21/1991 (30 y.o. [name], [name] Primary Care Cinsere Mizrahi: Orion Crook Other Clinician: Referring Adamariz Gillott: Treating Merritt Mccravy/Extender: Roselyn Meier Weeks in Treatment: 73 Wound Status Wound Number: 9 Primary Etiology: Pressure Ulcer Wound Location: Left Ischium Wound Status: Open Wounding Event: Pressure Injury Comorbid History: Paraplegia Date Acquired: 05/09/2021 Weeks Of Treatment: 37 Clustered Wound: No Photos Wound Measurements Length: (cm) 0.9 Width: (cm) 1.7 Depth: (cm)  0.4 Area: (cm) 1.202 Volume: (cm) 0.481 % Reduction in Area: 47% % Reduction in Volume: -111.9% Epithelialization: Small (1-33%) Tunneling: No Undermining: No Wound Description Classification: Category/Stage III Wound Margin: Distinct, outline attached Exudate Amount: Medium Exudate Type: Serous Exudate Color: amber Foul Odor After Cleansing: No Slough/Fibrino Yes Wound Bed Granulation Amount: Large (67-100%) Exposed Structure Granulation Quality: Red Fascia Exposed: No Necrotic Amount: Small (1-33%) Fat Layer (Subcutaneous Tissue) Exposed: Yes Necrotic Quality: Adherent Slough Tendon Exposed: No Muscle Exposed: No Joint Exposed: No Bone Exposed: No Electronic Signature(s) Signed: 02/01/2022 4:18:19 PM By: Fonnie Mu RN Signed: 02/01/2022 4:44:26 PM By: Shawn Stall RN, BSN Entered By: Shawn Stall on 02/01/2022 13:29:10 -------------------------------------------------------------------------------- Vitals Details Patient Name: Date of Service: [name] March L. 02/01/2022 1:00 PM Medical Record Number: [id number] Patient Account Number: [id number] Date of Birth/Sex: Treating RN: May 03, 1991 (30 y.o. [name], [name] Primary Care Michole Lecuyer: Orion Crook Other Clinician: Referring Mackinley Cassaday: Treating Starlit Raburn/Extender: Roselyn Meier Weeks in Treatment: 65 Vital Signs Time Taken: 13:18 Temperature (F): 98.8 Height (in): 71 Pulse (bpm): 75 Weight (lbs): 136 Respiratory Rate (breaths/min): 17 Body Mass Index (BMI): 19 Blood Pressure (mmHg): 131/74 Reference Range: 80 - 120 mg / dl Electronic Signature(s) Signed: 02/01/2022 4:18:19 PM By: Fonnie Mu RN Signed: 02/01/2022 4:18:19 PM By: Fonnie Mu RN Entered By: Fonnie Mu on 02/01/2022 13:18:31

## 2022-02-01 NOTE — Progress Notes (Addendum)
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 ulcer of right buttock, stage 4 Pressure ulcer of left buttock, stage 4 Pressure ulcer of left hip, stage 3 Unspecified severe protein-calorie malnutrition Paraplegia, complete Other chronic osteomyelitis, other site Procedures Wound #9 Pre-procedure diagnosis of Wound #9  is a Pressure Ulcer located on the Left Ischium . There was a Excisional Skin/Subcutaneous Tissue Debridement with a total area of 1.53 sq cm performed by Lenda Kelp, PA. With the following instrument(s): Curette to remove Viable and Non-Viable tissue/material. Material removed includes Subcutaneous Tissue and Slough and after achieving pain control using Lidocaine. No specimens were taken. A time out was conducted at 13:40, prior to the start of the procedure. A Minimum amount of bleeding was controlled with Pressure. The procedure was tolerated well with a pain level of 0 throughout and a pain level of 0 following the procedure. Post Debridement  Measurements: 0.9cm length x 1.7cm width x 0.4cm depth; 0.481cm^3 volume. Post debridement Stage noted as Category/Stage III. Character of Wound/Ulcer Post Debridement is improved. Post procedure Diagnosis Wound #9: Same as Pre-Procedure Plan Follow-up Appointments: Return Appointment in 2 weeks. - with [name], PAC and Maryruth Bun, RN Room # 9 Bathing/ Shower/ Hygiene: May shower with protection but do not get wound dressing(s) wet. Off-Loading: Turn and reposition every 2 hours - be sure to lift up off the chair with arms every hour while in wheelchair Other: - pillows under left calf to keep pressure of left lateral ankle Non Wound Condition: Protect area with: - Protect sacrum with vaseline or zinc oxide WOUND #6: - Ischium Wound Laterality: Right Cleanser: Soap and Water 1 x Per Day/Other:90 days Discharge Instructions: May shower and wash wound with dial antibacterial soap and water prior to dressing change. Cleanser: Wound Cleanser 1 x Per Day/Other:90 days Discharge Instructions: Cleanse the wound with wound cleanser prior to applying a clean dressing using gauze sponges, not tissue or cotton balls. Prim Dressing: Woven Gauze Sponges, Sterile 4x4 (in/in) 1 x Per Day/Other:90 days ary Discharge Instructions: moisten with saline and pack  lightly into wound Secondary Dressing: Zetuvit Plus Silicone Border Dressing 4x4 (in/in) 1 x Per Day/Other:90 days Discharge Instructions: Apply silicone border over primary dressing as directed. WOUND #9: - Ischium Wound Laterality: Left Cleanser: Soap and Water 1 x Per Day/30 Days Discharge Instructions: May shower and wash wound with dial antibacterial soap and water prior to dressing change. Cleanser: Wound Cleanser 1 x Per Day/30 Days Discharge Instructions: Cleanse the wound with wound cleanser prior to applying a clean dressing using gauze sponges, not tissue or cotton balls. Prim Dressing: saline wet to dry 1 x Per Day/30 Days ary Discharge Instructions: saline moisten gauze lightly packed into wound bed. Secondary Dressing: Zetuvit Plus Silicone Border Dressing 4x4 (in/in) 1 x Per Day/30 Days Discharge Instructions: Apply silicone border over primary dressing as directed. 1. Would recommend that we going continue with the wound care measures as before and the patient is in agreement with plan this includes the use of the saline wet-to-dry dressings he does seem to been doing well with this. 2. Also can recommend that we have the patient continue with the Zetuvit bordered foam dressing to cover or something equally good that he is using at home. He is also doing great staying off of this that shows and how well the wounds are healing. We will see patient back for reevaluation in 2 weeks here in the clinic. If anything worsens or changes patient will contact our office for additional recommendations. Electronic Signature(s) Signed: 02/01/2022 1:44:55 PM By: Lenda Kelp PA-C Entered By: Lenda Kelp on 02/01/2022 13:44:55 -------------------------------------------------------------------------------- SuperBill Details Patient Name: Date of Service: [name] 02/01/2022 Medical Record Number: [id number] Patient Account Number: [id number] Date of Birth/Sex: Treating  RN: 04-08-1991 (30 y.o. M) Primary Care Provider: Orion Crook Other Clinician: Referring Provider: Treating Provider/Extender: Roselyn Meier Weeks in Treatment: 91 Diagnosis Coding ICD-10 Codes Code Description L89.314 Pressure ulcer of right buttock, stage 4 L89.324 Pressure ulcer of left buttock, stage 4 L89.223 Pressure ulcer of left hip, stage 3 E43 Unspecified severe protein-calorie malnutrition G82.21 Paraplegia, complete M86.68 Other chronic osteomyelitis, other site Facility Procedures CPT4 Code: [id number] Description: 11042 - DEB SUBQ TISSUE 20 SQ CM/< ICD-10 Diagnosis Description L89.324 Pressure ulcer of left buttock, stage 4 Modifier: Quantity: 1 Physician Procedures : CPT4 Code Description Modifier  [id number] 99214 - WC PHYS LEVEL 4 - EST PT 25 ICD-10 Diagnosis Description L89.314 Pressure ulcer of right buttock, stage 4 L89.324 Pressure ulcer of left buttock, stage 4 L89.223 Pressure ulcer of left hip, stage 3 E43  Unspecified severe protein-calorie malnutrition Quantity: 1 : [id number] 11042 - WC PHYS SUBQ TISS 20 SQ CM ICD-10 Diagnosis Description L89.324 Pressure ulcer of left buttock, stage 4 Quantity: 1 Electronic Signature(s) Signed: 02/01/2022 1:45:21 PM By: Lenda Kelp PA-C Entered By: Lenda Kelp on 02/01/2022 13:45:20

## 2022-02-15 ENCOUNTER — Other Ambulatory Visit: Payer: Self-pay | Admitting: Physical Medicine and Rehabilitation

## 2022-02-15 ENCOUNTER — Encounter (HOSPITAL_BASED_OUTPATIENT_CLINIC_OR_DEPARTMENT_OTHER): Payer: Medicaid Other | Admitting: Physician Assistant

## 2022-02-16 MED ORDER — OXYCODONE HCL 10 MG PO TABS: 10.0000 mg | ORAL_TABLET | Freq: Four times a day (QID) | ORAL | 0 refills | Status: DC | PRN

## 2022-02-27 ENCOUNTER — Encounter
Payer: Medicaid Other | Attending: Physical Medicine and Rehabilitation | Admitting: Physical Medicine and Rehabilitation

## 2022-02-27 ENCOUNTER — Encounter: Payer: Self-pay | Admitting: Physical Medicine and Rehabilitation

## 2022-02-27 VITALS — BP 130/77 | HR 96 | Temp 97.9°F | Ht 72.0 in | Wt 141.2 lb

## 2022-02-27 DIAGNOSIS — N319 Neuromuscular dysfunction of bladder, unspecified: Secondary | ICD-10-CM | POA: Insufficient documentation

## 2022-02-27 DIAGNOSIS — G8220 Paraplegia, unspecified: Secondary | ICD-10-CM | POA: Diagnosis present

## 2022-02-27 DIAGNOSIS — G894 Chronic pain syndrome: Secondary | ICD-10-CM | POA: Insufficient documentation

## 2022-02-27 DIAGNOSIS — M898X9 Other specified disorders of bone, unspecified site: Secondary | ICD-10-CM | POA: Diagnosis present

## 2022-02-27 DIAGNOSIS — R252 Cramp and spasm: Secondary | ICD-10-CM | POA: Insufficient documentation

## 2022-02-27 DIAGNOSIS — R29818 Other symptoms and signs involving the nervous system: Secondary | ICD-10-CM | POA: Diagnosis present

## 2022-02-27 DIAGNOSIS — Z993 Dependence on wheelchair: Secondary | ICD-10-CM | POA: Diagnosis present

## 2022-02-27 MED ORDER — BACLOFEN 20 MG PO TABS: 40.0000 mg | ORAL_TABLET | Freq: Four times a day (QID) | ORAL | 11 refills | Status: DC

## 2022-02-27 MED ORDER — TIZANIDINE HCL 4 MG PO TABS: ORAL_TABLET | ORAL | 5 refills | Status: DC

## 2022-02-27 NOTE — Patient Instructions (Signed)
Pt is a 31 yr old male with T10 ASIA A paraplegia, neurogenic bowel and bladder and back pain here  For f/u- also had DVT and Pressure ulcer- sacrum- Heels have healed. ?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 ?Here for f/u on SCI/paraplegia.  ? ? ?Having more spasms- will increase Zanaflex/Tizanidine to 8 mg in Am but increase it to 12 mg nightly- if needs more, can do a max of 16 mg nightly- -  ? ?2. Maintain Baclofen 40 mg 4x/day-- sent in 1 year refills- ? ? ?3. Con't tramadol 100 mg QID - has 2-3 refills left.  ? ? ?4. Just sent in Oxycodone 10 mg prn for severe pain- hasn't been able to get from pharmacy- even though sen tin 3/23- out of stock- suggest can change the pharmacy.  ? ? ?5. Write written Rx for in/out catheters   either single use or red rubber catheters- 4-6x/day. Rx given.  ? ?6. Seeing Dr Redmond Pulling about Heterotopic ossification- to have surgery this month.  ? ?7. Seeing wound care for pressure ulcers- no issues there.  ?Doesn't need referral to Plastics at this time.  ? ?8.Marland Kitchen F/U in 3 months- double appt- SCI ?
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
## 2022-03-30 ENCOUNTER — Encounter (HOSPITAL_BASED_OUTPATIENT_CLINIC_OR_DEPARTMENT_OTHER): Payer: Medicaid Other | Attending: Internal Medicine | Admitting: Internal Medicine

## 2022-04-07 ENCOUNTER — Emergency Department (HOSPITAL_COMMUNITY)
Admission: EM | Admit: 2022-04-07 | Discharge: 2022-04-08 | Discharge status: Against Medical Advice | Payer: Medicaid Other | Attending: Emergency Medicine | Admitting: Emergency Medicine

## 2022-04-07 ENCOUNTER — Other Ambulatory Visit: Payer: Self-pay

## 2022-04-07 ENCOUNTER — Encounter (HOSPITAL_COMMUNITY): Payer: Self-pay | Admitting: *Deleted

## 2022-04-07 DIAGNOSIS — R319 Hematuria, unspecified: Secondary | ICD-10-CM | POA: Diagnosis not present

## 2022-04-07 DIAGNOSIS — J029 Acute pharyngitis, unspecified: Secondary | ICD-10-CM | POA: Diagnosis present

## 2022-04-07 DIAGNOSIS — L89309 Pressure ulcer of unspecified buttock, unspecified stage: Secondary | ICD-10-CM | POA: Diagnosis not present

## 2022-04-07 DIAGNOSIS — Z5321 Procedure and treatment not carried out due to patient leaving prior to being seen by health care provider: Secondary | ICD-10-CM | POA: Diagnosis not present

## 2022-04-07 DIAGNOSIS — Z4800 Encounter for change or removal of nonsurgical wound dressing: Secondary | ICD-10-CM | POA: Diagnosis not present

## 2022-04-07 DIAGNOSIS — G8220 Paraplegia, unspecified: Secondary | ICD-10-CM | POA: Insufficient documentation

## 2022-04-07 LAB — CBC WITH DIFFERENTIAL/PLATELET
Abs Immature Granulocytes: 0.01 10*3/uL (ref 0.00–0.07)
Basophils Absolute: 0 10*3/uL (ref 0.0–0.1)
Basophils Relative: 0 %
Eosinophils Absolute: 0.1 10*3/uL (ref 0.0–0.5)
Eosinophils Relative: 1 %
HCT: 41.4 % (ref 39.0–52.0)
Hemoglobin: 12.7 g/dL — ABNORMAL LOW (ref 13.0–17.0)
Immature Granulocytes: 0 %
Lymphocytes Relative: 31 %
Lymphs Abs: 2.2 10*3/uL (ref 0.7–4.0)
MCH: 22.8 pg — ABNORMAL LOW (ref 26.0–34.0)
MCHC: 30.7 g/dL (ref 30.0–36.0)
MCV: 74.2 fL — ABNORMAL LOW (ref 80.0–100.0)
Monocytes Absolute: 0.5 10*3/uL (ref 0.1–1.0)
Monocytes Relative: 8 %
Neutro Abs: 4.2 10*3/uL (ref 1.7–7.7)
Neutrophils Relative %: 60 %
Platelets: 336 10*3/uL (ref 150–400)
RBC: 5.58 MIL/uL (ref 4.22–5.81)
RDW: 15.2 % (ref 11.5–15.5)
WBC: 7 10*3/uL (ref 4.0–10.5)
nRBC: 0 % (ref 0.0–0.2)

## 2022-04-07 LAB — URINALYSIS, ROUTINE W REFLEX MICROSCOPIC
Bilirubin Urine: NEGATIVE
Glucose, UA: NEGATIVE mg/dL
Hgb urine dipstick: NEGATIVE
Ketones, ur: 5 mg/dL — AB
Leukocytes,Ua: NEGATIVE
Nitrite: NEGATIVE
Protein, ur: NEGATIVE mg/dL
Specific Gravity, Urine: 1.024 (ref 1.005–1.030)
pH: 5 (ref 5.0–8.0)

## 2022-04-07 LAB — BASIC METABOLIC PANEL
Anion gap: 8 (ref 5–15)
BUN: 11 mg/dL (ref 6–20)
CO2: 27 mmol/L (ref 22–32)
Calcium: 9.3 mg/dL (ref 8.9–10.3)
Chloride: 104 mmol/L (ref 98–111)
Creatinine, Ser: 0.75 mg/dL (ref 0.61–1.24)
GFR, Estimated: >60 mL/min (ref >60)
Glucose, Bld: 104 mg/dL — ABNORMAL HIGH (ref 70–99)
Potassium: 4 mmol/L (ref 3.5–5.1)
Sodium: 139 mmol/L (ref 135–145)

## 2022-04-07 NOTE — ED Provider Triage Note (Signed)
Emergency Medicine Provider Triage Evaluation Note ? ?Jaris Nelia Shi , a 31 y.o. male  was evaluated in triage.  Pt complains of dark and malodorous urine, pressure ulcers on his bottom, and transient sore throat.  Sore throat only lasted a few days.  Urine changes began 3-4 days ago.  Endorses subjective low-grade fever.  Pressure ulcers have been slowly developing, as patient is paraplegic.  Cannot feel from the bellybutton down.  Unable to discern whether he has suprapubic tenderness or not for this reason.  Denies chest pain, shortness of breath, dizziness, lightheadedness, cough, difficulty swallowing, congestion. ? ?Review of Systems  ?Positive: As above ?Negative: As above ? ?Physical Exam  ?BP 117/80 (BP Location: Right Arm)   Pulse 85   Temp 97.8 ?F (36.6 ?C) (Oral)   Resp 14   Ht 6' (1.829 m)   Wt 64 kg   SpO2 91%   BMI 19.14 kg/m?  ?Gen:   Awake, no distress   ?Resp:  Normal effort, CTAB ?MSK:   Moves extremities without difficulty  ?Other:  Unable to discern whether the patient has suprapubic tenderness due to paraplegic status.  Afebrile.  Not tachycardic.  No cervical adenopathy appreciated. ? ?Medical Decision Making  ?Medically screening exam initiated at 7:21 PM.  Appropriate orders placed.  Rishan L Boomhower was informed that the remainder of the evaluation will be completed by another provider, this initial triage assessment does not replace that evaluation, and the importance of remaining in the ED until their evaluation is complete. ? ?Labs, imaging ordered ?  ?Cecil Cobbs, PA-C ?04/07/22 1938 ? ?

## 2022-04-07 NOTE — ED Triage Notes (Signed)
The pt is a paraplegic his urine is dark  and he has had a sore throat  and he has ulcers on his buttocks   his pain has increased for 4 days low grade temp also ?

## 2022-04-09 LAB — URINE CULTURE: Culture: NO GROWTH

## 2022-04-12 ENCOUNTER — Other Ambulatory Visit: Payer: Self-pay | Admitting: Physical Medicine and Rehabilitation

## 2022-04-15 ENCOUNTER — Other Ambulatory Visit: Payer: Self-pay

## 2022-04-15 ENCOUNTER — Emergency Department
Admission: EM | Admit: 2022-04-15 | Discharge: 2022-04-15 | Disposition: A | Discharge status: Home / Self Care | Payer: Medicaid Other | Attending: Emergency Medicine | Admitting: Emergency Medicine

## 2022-04-15 ENCOUNTER — Emergency Department: Payer: Medicaid Other

## 2022-04-15 DIAGNOSIS — K529 Noninfective gastroenteritis and colitis, unspecified: Secondary | ICD-10-CM | POA: Insufficient documentation

## 2022-04-15 DIAGNOSIS — J029 Acute pharyngitis, unspecified: Secondary | ICD-10-CM | POA: Diagnosis not present

## 2022-04-15 DIAGNOSIS — Z20822 Contact with and (suspected) exposure to covid-19: Secondary | ICD-10-CM | POA: Insufficient documentation

## 2022-04-15 DIAGNOSIS — R109 Unspecified abdominal pain: Secondary | ICD-10-CM | POA: Diagnosis present

## 2022-04-15 LAB — COMPREHENSIVE METABOLIC PANEL
ALT: 16 U/L (ref 0–44)
AST: 19 U/L (ref 15–41)
Albumin: 3.9 g/dL (ref 3.5–5.0)
Alkaline Phosphatase: 67 U/L (ref 38–126)
Anion gap: 8 (ref 5–15)
BUN: 10 mg/dL (ref 6–20)
CO2: 27 mmol/L (ref 22–32)
Calcium: 8.9 mg/dL (ref 8.9–10.3)
Chloride: 106 mmol/L (ref 98–111)
Creatinine, Ser: 0.75 mg/dL (ref 0.61–1.24)
GFR, Estimated: >60 mL/min (ref >60)
Glucose, Bld: 127 mg/dL — ABNORMAL HIGH (ref 70–99)
Potassium: 4.4 mmol/L (ref 3.5–5.1)
Sodium: 141 mmol/L (ref 135–145)
Total Bilirubin: 0.3 mg/dL (ref 0.3–1.2)
Total Protein: 7.9 g/dL (ref 6.5–8.1)

## 2022-04-15 LAB — CBC
HCT: 46.3 % (ref 39.0–52.0)
Hemoglobin: 13.4 g/dL (ref 13.0–17.0)
MCH: 21.8 pg — ABNORMAL LOW (ref 26.0–34.0)
MCHC: 28.9 g/dL — ABNORMAL LOW (ref 30.0–36.0)
MCV: 75.4 fL — ABNORMAL LOW (ref 80.0–100.0)
Platelets: 383 10*3/uL (ref 150–400)
RBC: 6.14 MIL/uL — ABNORMAL HIGH (ref 4.22–5.81)
RDW: 15.7 % — ABNORMAL HIGH (ref 11.5–15.5)
WBC: 5.2 10*3/uL (ref 4.0–10.5)
nRBC: 0 % (ref 0.0–0.2)

## 2022-04-15 LAB — URINALYSIS, ROUTINE W REFLEX MICROSCOPIC
Bilirubin Urine: NEGATIVE
Glucose, UA: NEGATIVE mg/dL
Hgb urine dipstick: NEGATIVE
Ketones, ur: NEGATIVE mg/dL
Leukocytes,Ua: NEGATIVE
Nitrite: NEGATIVE
Protein, ur: NEGATIVE mg/dL
Specific Gravity, Urine: 1.025 (ref 1.005–1.030)
pH: 6 (ref 5.0–8.0)

## 2022-04-15 LAB — RESP PANEL BY RT-PCR (FLU A&B, COVID) ARPGX2
Influenza A by PCR: NEGATIVE
Influenza B by PCR: NEGATIVE
SARS Coronavirus 2 by RT PCR: NEGATIVE

## 2022-04-15 LAB — LIPASE, BLOOD: Lipase: 31 U/L (ref 11–51)

## 2022-04-15 LAB — GROUP A STREP BY PCR: Group A Strep by PCR: NOT DETECTED

## 2022-04-15 MED ORDER — ONDANSETRON 8 MG PO TBDP: 8.0000 mg | ORAL_TABLET | Freq: Three times a day (TID) | ORAL | 0 refills | Status: DC | PRN

## 2022-04-15 MED ORDER — BACLOFEN 10 MG PO TABS
20.0000 mg | ORAL_TABLET | Freq: Once | ORAL | Status: AC
  Administered 2022-04-15: 20 mg via ORAL
  Filled 2022-04-15: qty 2

## 2022-04-15 MED ORDER — METRONIDAZOLE 500 MG PO TABS: 500.0000 mg | ORAL_TABLET | Freq: Three times a day (TID) | ORAL | 0 refills | Status: AC

## 2022-04-15 MED ORDER — SODIUM CHLORIDE 0.9 % IV BOLUS: 1000.0000 mL | Freq: Once | INTRAVENOUS | Status: DC

## 2022-04-15 MED ORDER — ONDANSETRON HCL 4 MG/2ML IJ SOLN
4.0000 mg | Freq: Once | INTRAMUSCULAR | Status: DC
  Filled 2022-04-15: qty 2

## 2022-04-15 MED ORDER — CEPHALEXIN 500 MG PO CAPS: 500.0000 mg | ORAL_CAPSULE | Freq: Three times a day (TID) | ORAL | 0 refills | Status: AC

## 2022-04-15 NOTE — ED Notes (Signed)
Attempted to draw labs X2 without success. Lab called.

## 2022-04-15 NOTE — Progress Notes (Signed)
Attempted PIV x2, unsuccessfully due to valves, unable to thread IV catheter.  Pt refuses any further attempts. States he prefers to take any medications orally.  Secure chat sent to [facility]

## 2022-04-15 NOTE — Discharge Instructions (Addendum)
Take the antibiotics as prescribed and finish the full 10-day course.  You may take the Zofran as needed for nausea.  Follow-up with your doctor within the next week.  Return to the ER for new, worsening, or persistent severe nausea, vomiting, fever, diarrhea, blood in the stool, pus or other drainage from your buttock wound, weakness, difficulty breathing, or any other new or worsening symptoms that concern you.

## 2022-04-15 NOTE — ED Notes (Signed)
Attempted IV stick. Stick unsuccessful.
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
## 2022-04-15 NOTE — ED Notes (Signed)
Attempted to draw blood  unsuccessful. 

## 2022-04-15 NOTE — ED Triage Notes (Signed)
Pt states he he has been having abd pain and nausea- pt states this has happened before and he had "an infection" but was unable to say what kind- pt states he has problems with his urination

## 2022-04-15 NOTE — ED Notes (Signed)
Lab with pt in triage 2

## 2022-05-24 ENCOUNTER — Emergency Department
Admission: EM | Admit: 2022-05-24 | Discharge: 2022-05-24 | Disposition: A | Discharge status: Home / Self Care | Payer: Medicaid Other | Attending: Emergency Medicine | Admitting: Emergency Medicine

## 2022-05-24 ENCOUNTER — Other Ambulatory Visit: Payer: Self-pay

## 2022-05-24 ENCOUNTER — Emergency Department: Payer: Medicaid Other

## 2022-05-24 DIAGNOSIS — K5909 Other constipation: Secondary | ICD-10-CM | POA: Diagnosis not present

## 2022-05-24 DIAGNOSIS — R109 Unspecified abdominal pain: Secondary | ICD-10-CM | POA: Diagnosis present

## 2022-05-24 DIAGNOSIS — N3000 Acute cystitis without hematuria: Secondary | ICD-10-CM | POA: Insufficient documentation

## 2022-05-24 LAB — URINALYSIS, ROUTINE W REFLEX MICROSCOPIC
Bilirubin Urine: NEGATIVE
Glucose, UA: NEGATIVE mg/dL
Ketones, ur: NEGATIVE mg/dL
Nitrite: POSITIVE — AB
Protein, ur: 100 mg/dL — AB
RBC / HPF: >50 RBC/hpf — ABNORMAL HIGH (ref 0–5)
Specific Gravity, Urine: 1.024 (ref 1.005–1.030)
Squamous Epithelial / HPF: NONE SEEN (ref 0–5)
WBC, UA: >50 WBC/hpf — ABNORMAL HIGH (ref 0–5)
pH: 7 (ref 5.0–8.0)

## 2022-05-24 LAB — COMPREHENSIVE METABOLIC PANEL
ALT: 16 U/L (ref 0–44)
AST: 13 U/L — ABNORMAL LOW (ref 15–41)
Albumin: 4.4 g/dL (ref 3.5–5.0)
Alkaline Phosphatase: 52 U/L (ref 38–126)
Anion gap: 6 (ref 5–15)
BUN: 20 mg/dL (ref 6–20)
CO2: 27 mmol/L (ref 22–32)
Calcium: 9.6 mg/dL (ref 8.9–10.3)
Chloride: 111 mmol/L (ref 98–111)
Creatinine, Ser: 0.78 mg/dL (ref 0.61–1.24)
GFR, Estimated: >60 mL/min (ref >60)
Glucose, Bld: 103 mg/dL — ABNORMAL HIGH (ref 70–99)
Potassium: 4.7 mmol/L (ref 3.5–5.1)
Sodium: 144 mmol/L (ref 135–145)
Total Bilirubin: 0.5 mg/dL (ref 0.3–1.2)
Total Protein: 7.8 g/dL (ref 6.5–8.1)

## 2022-05-24 LAB — CBC WITH DIFFERENTIAL/PLATELET
Abs Immature Granulocytes: 0.01 10*3/uL (ref 0.00–0.07)
Basophils Absolute: 0 10*3/uL (ref 0.0–0.1)
Basophils Relative: 1 %
Eosinophils Absolute: 0 10*3/uL (ref 0.0–0.5)
Eosinophils Relative: 1 %
HCT: 45.6 % (ref 39.0–52.0)
Hemoglobin: 13.6 g/dL (ref 13.0–17.0)
Immature Granulocytes: 0 %
Lymphocytes Relative: 36 %
Lymphs Abs: 2.3 10*3/uL (ref 0.7–4.0)
MCH: 22.6 pg — ABNORMAL LOW (ref 26.0–34.0)
MCHC: 29.8 g/dL — ABNORMAL LOW (ref 30.0–36.0)
MCV: 75.9 fL — ABNORMAL LOW (ref 80.0–100.0)
Monocytes Absolute: 0.5 10*3/uL (ref 0.1–1.0)
Monocytes Relative: 8 %
Neutro Abs: 3.5 10*3/uL (ref 1.7–7.7)
Neutrophils Relative %: 54 %
Platelets: 358 10*3/uL (ref 150–400)
RBC: 6.01 MIL/uL — ABNORMAL HIGH (ref 4.22–5.81)
RDW: 15.5 % (ref 11.5–15.5)
WBC: 6.4 10*3/uL (ref 4.0–10.5)
nRBC: 0 % (ref 0.0–0.2)

## 2022-05-24 LAB — LIPASE, BLOOD: Lipase: 27 U/L (ref 11–51)

## 2022-05-24 MED ORDER — POLYETHYLENE GLYCOL 3350 17 G PO PACK
PACK | ORAL | 0 refills | Status: DC
Start: 2022-05-24 — End: 2022-08-25

## 2022-05-24 MED ORDER — FOSFOMYCIN TROMETHAMINE 3 G PO PACK
3.0000 g | PACK | Freq: Once | ORAL | Status: AC
  Administered 2022-05-24: 3 g via ORAL
  Filled 2022-05-24: qty 3

## 2022-05-24 MED ORDER — FLEET ENEMA 7-19 GM/118ML RE ENEM: 1.0000 | ENEMA | Freq: Once | RECTAL | Status: DC

## 2022-05-24 MED ORDER — DOCUSATE SODIUM 250 MG PO CAPS: 250.0000 mg | ORAL_CAPSULE | Freq: Every day | ORAL | 0 refills | Status: AC

## 2022-05-24 MED ORDER — LACTULOSE 10 GM/15ML PO SOLN: 20.0000 g | Freq: Once | ORAL | Status: DC

## 2022-05-24 MED ORDER — FOSFOMYCIN TROMETHAMINE 3 G PO PACK: 3.0000 g | PACK | Freq: Once | ORAL | Status: DC

## 2022-05-24 MED ORDER — FOSFOMYCIN TROMETHAMINE 3 G PO PACK: 3.0000 g | PACK | ORAL | 0 refills | Status: AC

## 2022-05-24 NOTE — Discharge Instructions (Addendum)
I'd take Miralax 2-3 x daily until bowel movements are soft and regular  Take the FOSFOMYCIN antibiotic today, on Saturday, and Tuesday  Try to keep your bowel movements regular  Take DAILY colace/stool softener to help prevent UTIs and impaction
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
## 2022-05-24 NOTE — ED Triage Notes (Addendum)
Pt arrives with c/o flank pain and urinary symptoms that include decreased outpt, darker color urine, and bladder fullness that started about 1-2 weeks ago. Pt denies fever.

## 2022-05-24 NOTE — ED Notes (Signed)
Pt is attempting to disimpact himself as he does at home. Given supplies

## 2022-05-26 LAB — URINE CULTURE: Culture: >=100000 — AB

## 2022-06-19 ENCOUNTER — Encounter
Payer: Medicaid Other | Attending: Physical Medicine and Rehabilitation | Admitting: Physical Medicine and Rehabilitation

## 2022-06-19 DIAGNOSIS — R29818 Other symptoms and signs involving the nervous system: Secondary | ICD-10-CM | POA: Insufficient documentation

## 2022-06-19 DIAGNOSIS — Z993 Dependence on wheelchair: Secondary | ICD-10-CM | POA: Insufficient documentation

## 2022-06-19 DIAGNOSIS — N319 Neuromuscular dysfunction of bladder, unspecified: Secondary | ICD-10-CM | POA: Insufficient documentation

## 2022-06-19 DIAGNOSIS — G894 Chronic pain syndrome: Secondary | ICD-10-CM | POA: Insufficient documentation

## 2022-06-19 DIAGNOSIS — M898X9 Other specified disorders of bone, unspecified site: Secondary | ICD-10-CM | POA: Insufficient documentation

## 2022-06-19 DIAGNOSIS — R252 Cramp and spasm: Secondary | ICD-10-CM | POA: Insufficient documentation

## 2022-06-19 DIAGNOSIS — G8220 Paraplegia, unspecified: Secondary | ICD-10-CM | POA: Insufficient documentation
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
## 2022-08-25 ENCOUNTER — Emergency Department
Admission: EM | Admit: 2022-08-25 | Discharge: 2022-08-25 | Disposition: A | Discharge status: Home / Self Care | Payer: Medicaid Other | Attending: Emergency Medicine | Admitting: Emergency Medicine

## 2022-08-25 ENCOUNTER — Emergency Department: Payer: Medicaid Other

## 2022-08-25 DIAGNOSIS — L89159 Pressure ulcer of sacral region, unspecified stage: Secondary | ICD-10-CM | POA: Diagnosis not present

## 2022-08-25 DIAGNOSIS — N3091 Cystitis, unspecified with hematuria: Secondary | ICD-10-CM | POA: Diagnosis not present

## 2022-08-25 DIAGNOSIS — K5901 Slow transit constipation: Secondary | ICD-10-CM | POA: Insufficient documentation

## 2022-08-25 DIAGNOSIS — N3001 Acute cystitis with hematuria: Secondary | ICD-10-CM

## 2022-08-25 DIAGNOSIS — K5900 Constipation, unspecified: Secondary | ICD-10-CM | POA: Diagnosis present

## 2022-08-25 LAB — LACTIC ACID, PLASMA: Lactic Acid, Venous: 1.6 mmol/L (ref 0.5–1.9)

## 2022-08-25 LAB — URINALYSIS, ROUTINE W REFLEX MICROSCOPIC
Bacteria, UA: NONE SEEN
Bilirubin Urine: NEGATIVE
Glucose, UA: NEGATIVE mg/dL
Ketones, ur: 20 mg/dL — AB
Nitrite: POSITIVE — AB
Protein, ur: 100 mg/dL — AB
RBC / HPF: >50 RBC/hpf — ABNORMAL HIGH (ref 0–5)
Specific Gravity, Urine: >1.046 — ABNORMAL HIGH (ref 1.005–1.030)
WBC, UA: >50 WBC/hpf — ABNORMAL HIGH (ref 0–5)
pH: 7 (ref 5.0–8.0)

## 2022-08-25 LAB — COMPREHENSIVE METABOLIC PANEL
ALT: 11 U/L (ref 0–44)
AST: 16 U/L (ref 15–41)
Albumin: 3.8 g/dL (ref 3.5–5.0)
Alkaline Phosphatase: 61 U/L (ref 38–126)
Anion gap: 6 (ref 5–15)
BUN: 14 mg/dL (ref 6–20)
CO2: 26 mmol/L (ref 22–32)
Calcium: 8.8 mg/dL — ABNORMAL LOW (ref 8.9–10.3)
Chloride: 107 mmol/L (ref 98–111)
Creatinine, Ser: 0.83 mg/dL (ref 0.61–1.24)
GFR, Estimated: >60 mL/min (ref >60)
Glucose, Bld: 95 mg/dL (ref 70–99)
Potassium: 3.9 mmol/L (ref 3.5–5.1)
Sodium: 139 mmol/L (ref 135–145)
Total Bilirubin: 0.9 mg/dL (ref 0.3–1.2)
Total Protein: 8.1 g/dL (ref 6.5–8.1)

## 2022-08-25 LAB — CBC
HCT: 41.6 % (ref 39.0–52.0)
Hemoglobin: 12.6 g/dL — ABNORMAL LOW (ref 13.0–17.0)
MCH: 22.3 pg — ABNORMAL LOW (ref 26.0–34.0)
MCHC: 30.3 g/dL (ref 30.0–36.0)
MCV: 73.8 fL — ABNORMAL LOW (ref 80.0–100.0)
Platelets: 438 10*3/uL — ABNORMAL HIGH (ref 150–400)
RBC: 5.64 MIL/uL (ref 4.22–5.81)
RDW: 14.4 % (ref 11.5–15.5)
WBC: 10.2 10*3/uL (ref 4.0–10.5)
nRBC: 0 % (ref 0.0–0.2)

## 2022-08-25 LAB — LIPASE, BLOOD: Lipase: 24 U/L (ref 11–51)

## 2022-08-25 MED ORDER — SENNOSIDES-DOCUSATE SODIUM 8.6-50 MG PO TABS: 1.0000 | ORAL_TABLET | Freq: Every day | ORAL | 0 refills | Status: DC

## 2022-08-25 MED ORDER — SULFAMETHOXAZOLE-TRIMETHOPRIM 800-160 MG PO TABS: 1.0000 | ORAL_TABLET | Freq: Two times a day (BID) | ORAL | 0 refills | Status: DC

## 2022-08-25 MED ORDER — IOHEXOL 300 MG/ML  SOLN
100.0000 mL | Freq: Once | INTRAMUSCULAR | Status: AC | PRN
  Administered 2022-08-25: 100 mL via INTRAVENOUS

## 2022-08-25 MED ORDER — MAGNESIUM CITRATE PO SOLN
1.0000 | Freq: Once | ORAL | Status: AC
  Administered 2022-08-25: 1 via ORAL
  Filled 2022-08-25: qty 296

## 2022-08-25 MED ORDER — CEPHALEXIN 500 MG PO CAPS: 500.0000 mg | ORAL_CAPSULE | Freq: Four times a day (QID) | ORAL | 0 refills | Status: AC

## 2022-08-25 MED ORDER — POLYETHYLENE GLYCOL 3350 17 G PO PACK: 17.0000 g | PACK | Freq: Every day | ORAL | 0 refills | Status: DC

## 2022-08-25 MED ORDER — SODIUM CHLORIDE 0.9 % IV SOLN
1.0000 g | Freq: Once | INTRAVENOUS | Status: AC
  Administered 2022-08-25: 1 g via INTRAVENOUS
  Filled 2022-08-25: qty 10

## 2022-08-25 MED ORDER — METRONIDAZOLE 500 MG PO TABS: 500.0000 mg | ORAL_TABLET | Freq: Two times a day (BID) | ORAL | 0 refills | Status: DC

## 2022-08-25 MED ORDER — MAGNESIUM CITRATE PO SOLN: 1.0000 | Freq: Once | ORAL | 0 refills | Status: AC

## 2022-08-25 MED ORDER — DOCUSATE SODIUM 50 MG/5ML PO LIQD
50.0000 mg | Freq: Once | ORAL | Status: AC
  Administered 2022-08-25: 50 mg via ORAL
  Filled 2022-08-25: qty 10

## 2022-08-25 MED ORDER — CIPROFLOXACIN IN D5W 200 MG/100ML IV SOLN
200.0000 mg | Freq: Once | INTRAVENOUS | Status: AC
  Administered 2022-08-25: 200 mg via INTRAVENOUS
  Filled 2022-08-25: qty 100

## 2022-08-25 MED ORDER — OXYCODONE-ACETAMINOPHEN 5-325 MG PO TABS: 1.0000 | ORAL_TABLET | Freq: Once | ORAL | Status: DC

## 2022-08-25 MED ORDER — SODIUM CHLORIDE 0.9 % IV BOLUS
1000.0000 mL | Freq: Once | INTRAVENOUS | Status: AC
  Administered 2022-08-25: 1000 mL via INTRAVENOUS

## 2022-08-25 NOTE — ED Notes (Signed)
Pt complained of "itching and burning" at IV site. IV site red and irritated in appearance extending up to mid bicep. IV stopped and provider notified. Received verbal orders to discontinue cipro.

## 2022-08-25 NOTE — ED Triage Notes (Signed)
Pt sts that he has urine that smell like ammonia. Pt also has pressure ulcers and is having a constipation issue. PT says that he takes a stool softener and when his stool comes out its soft but has not had a BM in 1 week.
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
## 2022-08-25 NOTE — ED Notes (Signed)
Signing pad did not work, pt verbalized understanding of DC instructions. 

## 2022-08-27 LAB — URINE CULTURE

## 2022-08-30 ENCOUNTER — Emergency Department
Admission: EM | Admit: 2022-08-30 | Discharge: 2022-08-30 | Disposition: A | Discharge status: Home / Self Care | Payer: Medicaid Other | Attending: Emergency Medicine | Admitting: Emergency Medicine

## 2022-08-30 ENCOUNTER — Other Ambulatory Visit: Payer: Self-pay

## 2022-08-30 ENCOUNTER — Encounter: Payer: Self-pay | Admitting: Intensive Care

## 2022-08-30 DIAGNOSIS — K5900 Constipation, unspecified: Secondary | ICD-10-CM | POA: Diagnosis not present

## 2022-08-30 LAB — CULTURE, BLOOD (ROUTINE X 2)
Culture: NO GROWTH
Culture: NO GROWTH
Special Requests: ADEQUATE
Special Requests: ADEQUATE

## 2022-08-30 MED ORDER — POLYETHYLENE GLYCOL 3350 17 G PO PACK: 17.0000 g | PACK | Freq: Every day | ORAL | Status: DC
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
## 2022-08-30 NOTE — ED Triage Notes (Signed)
Patient presents with constipation. Seen on 08/25/22 and sent home with antibiotics for UTI and constipation medicine. Reports he still has not moved his bowels.

## 2022-08-30 NOTE — Discharge Instructions (Addendum)
   Thank you for choosing us for your health care today!  Please see your primary doctor this week for a follow up appointment.   If you do not have a primary doctor call the following clinics to establish care:  If you have insurance:  [facility] [phone] [address] [place] [address]   [facility]  [phone] [address] [place] [address]   If you do not have insurance:  [facility]  [phone] [address]  [place] [address]  Sometimes, in the early stages of certain disease courses it is difficult to detect in the emergency department evaluation -- so, it is important that you continue to monitor your symptoms and call your doctor right away or return to the emergency department if you develop any new or worsening symptoms.  It was my pleasure to care for you today.   Paydon Carll S. Jasyn Mey, MD  

## 2022-09-18 ENCOUNTER — Other Ambulatory Visit: Payer: Self-pay

## 2022-09-18 ENCOUNTER — Inpatient Hospital Stay: Payer: Medicaid Other | Admitting: Certified Registered"

## 2022-09-18 ENCOUNTER — Encounter
Admission: EM | Disposition: A | Discharge status: Home / Self Care | Payer: Self-pay | Source: Home / Self Care | Attending: Internal Medicine

## 2022-09-18 ENCOUNTER — Encounter: Payer: Self-pay | Admitting: Emergency Medicine

## 2022-09-18 ENCOUNTER — Inpatient Hospital Stay
Admission: EM | Admit: 2022-09-18 | Discharge: 2022-09-22 | DRG: 853 | Disposition: A | Discharge status: Home / Self Care | Payer: Medicaid Other | Attending: Internal Medicine | Admitting: Internal Medicine

## 2022-09-18 DIAGNOSIS — F17210 Nicotine dependence, cigarettes, uncomplicated: Secondary | ICD-10-CM | POA: Diagnosis present

## 2022-09-18 DIAGNOSIS — Z79899 Other long term (current) drug therapy: Secondary | ICD-10-CM | POA: Diagnosis not present

## 2022-09-18 DIAGNOSIS — N529 Male erectile dysfunction, unspecified: Secondary | ICD-10-CM | POA: Diagnosis present

## 2022-09-18 DIAGNOSIS — G8220 Paraplegia, unspecified: Secondary | ICD-10-CM | POA: Diagnosis not present

## 2022-09-18 DIAGNOSIS — L89313 Pressure ulcer of right buttock, stage 3: Secondary | ICD-10-CM

## 2022-09-18 DIAGNOSIS — N390 Urinary tract infection, site not specified: Secondary | ICD-10-CM | POA: Diagnosis present

## 2022-09-18 DIAGNOSIS — N3091 Cystitis, unspecified with hematuria: Secondary | ICD-10-CM | POA: Diagnosis present

## 2022-09-18 DIAGNOSIS — L89320 Pressure ulcer of left buttock, unstageable: Secondary | ICD-10-CM | POA: Diagnosis not present

## 2022-09-18 DIAGNOSIS — L89310 Pressure ulcer of right buttock, unstageable: Secondary | ICD-10-CM | POA: Diagnosis not present

## 2022-09-18 DIAGNOSIS — N319 Neuromuscular dysfunction of bladder, unspecified: Secondary | ICD-10-CM | POA: Diagnosis not present

## 2022-09-18 DIAGNOSIS — L89324 Pressure ulcer of left buttock, stage 4: Secondary | ICD-10-CM | POA: Diagnosis present

## 2022-09-18 DIAGNOSIS — N3090 Cystitis, unspecified without hematuria: Secondary | ICD-10-CM | POA: Diagnosis not present

## 2022-09-18 DIAGNOSIS — B964 Proteus (mirabilis) (morganii) as the cause of diseases classified elsewhere: Secondary | ICD-10-CM | POA: Diagnosis present

## 2022-09-18 DIAGNOSIS — E876 Hypokalemia: Secondary | ICD-10-CM | POA: Diagnosis present

## 2022-09-18 DIAGNOSIS — A419 Sepsis, unspecified organism: Principal | ICD-10-CM | POA: Diagnosis present

## 2022-09-18 DIAGNOSIS — R319 Hematuria, unspecified: Secondary | ICD-10-CM | POA: Diagnosis present

## 2022-09-18 DIAGNOSIS — W3400XS Accidental discharge from unspecified firearms or gun, sequela: Secondary | ICD-10-CM

## 2022-09-18 DIAGNOSIS — K592 Neurogenic bowel, not elsewhere classified: Secondary | ICD-10-CM | POA: Diagnosis present

## 2022-09-18 DIAGNOSIS — K5909 Other constipation: Secondary | ICD-10-CM | POA: Diagnosis present

## 2022-09-18 DIAGNOSIS — Z789 Other specified health status: Principal | ICD-10-CM

## 2022-09-18 DIAGNOSIS — Z881 Allergy status to other antibiotic agents status: Secondary | ICD-10-CM | POA: Diagnosis not present

## 2022-09-18 DIAGNOSIS — E440 Moderate protein-calorie malnutrition: Secondary | ICD-10-CM | POA: Diagnosis present

## 2022-09-18 DIAGNOSIS — E46 Unspecified protein-calorie malnutrition: Secondary | ICD-10-CM | POA: Diagnosis present

## 2022-09-18 DIAGNOSIS — Z79891 Long term (current) use of opiate analgesic: Secondary | ICD-10-CM

## 2022-09-18 DIAGNOSIS — F17200 Nicotine dependence, unspecified, uncomplicated: Secondary | ICD-10-CM | POA: Diagnosis present

## 2022-09-18 DIAGNOSIS — Z681 Body mass index (BMI) 19 or less, adult: Secondary | ICD-10-CM | POA: Diagnosis not present

## 2022-09-18 DIAGNOSIS — S24103S Unspecified injury at T7-T10 level of thoracic spinal cord, sequela: Secondary | ICD-10-CM

## 2022-09-18 DIAGNOSIS — S24109A Unspecified injury at unspecified level of thoracic spinal cord, initial encounter: Secondary | ICD-10-CM | POA: Diagnosis present

## 2022-09-18 HISTORY — PX: DEBRIDMENT OF DECUBITUS ULCER: SHX6276

## 2022-09-18 HISTORY — PX: APPLICATION OF WOUND VAC: SHX5189

## 2022-09-18 LAB — URINALYSIS, ROUTINE W REFLEX MICROSCOPIC
Bilirubin Urine: NEGATIVE
Glucose, UA: NEGATIVE mg/dL
Ketones, ur: 5 mg/dL — AB
Nitrite: POSITIVE — AB
Protein, ur: 100 mg/dL — AB
RBC / HPF: >50 RBC/hpf — ABNORMAL HIGH (ref 0–5)
Specific Gravity, Urine: 1.024 (ref 1.005–1.030)
Squamous Epithelial / HPF: NONE SEEN (ref 0–5)
WBC, UA: >50 WBC/hpf — ABNORMAL HIGH (ref 0–5)
pH: 5 (ref 5.0–8.0)

## 2022-09-18 LAB — BASIC METABOLIC PANEL
Anion gap: 10 (ref 5–15)
BUN: 13 mg/dL (ref 6–20)
CO2: 21 mmol/L — ABNORMAL LOW (ref 22–32)
Calcium: 8.8 mg/dL — ABNORMAL LOW (ref 8.9–10.3)
Chloride: 106 mmol/L (ref 98–111)
Creatinine, Ser: 0.85 mg/dL (ref 0.61–1.24)
GFR, Estimated: >60 mL/min (ref >60)
Glucose, Bld: 83 mg/dL (ref 70–99)
Potassium: 3.3 mmol/L — ABNORMAL LOW (ref 3.5–5.1)
Sodium: 137 mmol/L (ref 135–145)

## 2022-09-18 LAB — CBC
HCT: 37 % — ABNORMAL LOW (ref 39.0–52.0)
Hemoglobin: 11.2 g/dL — ABNORMAL LOW (ref 13.0–17.0)
MCH: 22 pg — ABNORMAL LOW (ref 26.0–34.0)
MCHC: 30.3 g/dL (ref 30.0–36.0)
MCV: 72.5 fL — ABNORMAL LOW (ref 80.0–100.0)
Platelets: 551 10*3/uL — ABNORMAL HIGH (ref 150–400)
RBC: 5.1 MIL/uL (ref 4.22–5.81)
RDW: 14.5 % (ref 11.5–15.5)
WBC: 17.8 10*3/uL — ABNORMAL HIGH (ref 4.0–10.5)
nRBC: 0 % (ref 0.0–0.2)

## 2022-09-18 LAB — MAGNESIUM: Magnesium: 1.9 mg/dL (ref 1.7–2.4)

## 2022-09-18 LAB — HIV ANTIBODY (ROUTINE TESTING W REFLEX): HIV Screen 4th Generation wRfx: NONREACTIVE

## 2022-09-18 SURGERY — DEBRIDMENT OF DECUBITUS ULCER
Anesthesia: Monitor Anesthesia Care | Site: Buttocks

## 2022-09-18 MED ORDER — 0.9 % SODIUM CHLORIDE (POUR BTL) OPTIME
TOPICAL | Status: DC | PRN
  Administered 2022-09-18: 1000 mL

## 2022-09-18 MED ORDER — ONDANSETRON HCL 4 MG/2ML IJ SOLN: 4.0000 mg | Freq: Four times a day (QID) | INTRAMUSCULAR | Status: DC | PRN

## 2022-09-18 MED ORDER — PROPOFOL 1000 MG/100ML IV EMUL
INTRAVENOUS | Status: AC
  Filled 2022-09-18: qty 100

## 2022-09-18 MED ORDER — ADULT MULTIVITAMIN W/MINERALS CH
1.0000 | ORAL_TABLET | Freq: Every day | ORAL | Status: DC
  Administered 2022-09-18 – 2022-09-20 (×2): 1 via ORAL
  Filled 2022-09-18 (×4): qty 1

## 2022-09-18 MED ORDER — ONDANSETRON HCL 4 MG PO TABS: 4.0000 mg | ORAL_TABLET | Freq: Four times a day (QID) | ORAL | Status: DC | PRN

## 2022-09-18 MED ORDER — DAKINS (1/4 STRENGTH) 0.125 % EX SOLN
CUTANEOUS | Status: DC | PRN
  Administered 2022-09-18: 1

## 2022-09-18 MED ORDER — ACETAMINOPHEN 650 MG RE SUPP: 650.0000 mg | Freq: Four times a day (QID) | RECTAL | Status: DC | PRN

## 2022-09-18 MED ORDER — OXYCODONE HCL 5 MG PO TABS
10.0000 mg | ORAL_TABLET | Freq: Four times a day (QID) | ORAL | Status: DC | PRN
  Administered 2022-09-18 – 2022-09-22 (×10): 10 mg via ORAL
  Filled 2022-09-18 (×10): qty 2

## 2022-09-18 MED ORDER — BUPIVACAINE-EPINEPHRINE (PF) 0.25% -1:200000 IJ SOLN
INTRAMUSCULAR | Status: AC
  Filled 2022-09-18: qty 30

## 2022-09-18 MED ORDER — ACETAMINOPHEN 325 MG PO TABS
650.0000 mg | ORAL_TABLET | Freq: Four times a day (QID) | ORAL | Status: DC | PRN
  Administered 2022-09-19: 650 mg via ORAL
  Filled 2022-09-18: qty 2

## 2022-09-18 MED ORDER — SODIUM CHLORIDE 0.9 % IV SOLN: 1.0000 g | INTRAVENOUS | Status: DC

## 2022-09-18 MED ORDER — PHENYLEPHRINE 80 MCG/ML (10ML) SYRINGE FOR IV PUSH (FOR BLOOD PRESSURE SUPPORT)
PREFILLED_SYRINGE | INTRAVENOUS | Status: DC | PRN
  Administered 2022-09-18: 160 ug via INTRAVENOUS
  Administered 2022-09-18 (×2): 80 ug via INTRAVENOUS

## 2022-09-18 MED ORDER — SODIUM CHLORIDE 0.9 % IV SOLN
1.0000 g | Freq: Once | INTRAVENOUS | Status: DC
  Filled 2022-09-18: qty 20

## 2022-09-18 MED ORDER — SODIUM CHLORIDE 0.9 % IV SOLN: INTRAVENOUS | Status: DC

## 2022-09-18 MED ORDER — PROPOFOL 10 MG/ML IV BOLUS
INTRAVENOUS | Status: AC
  Filled 2022-09-18: qty 20

## 2022-09-18 MED ORDER — VANCOMYCIN HCL IN DEXTROSE 1-5 GM/200ML-% IV SOLN: 1000.0000 mg | Freq: Once | INTRAVENOUS | Status: DC

## 2022-09-18 MED ORDER — SODIUM CHLORIDE 0.9 % IV SOLN
1.0000 g | INTRAVENOUS | Status: DC
  Administered 2022-09-19 – 2022-09-22 (×4): 1 g via INTRAVENOUS
  Filled 2022-09-18: qty 10
  Filled 2022-09-18: qty 1
  Filled 2022-09-18 (×2): qty 10

## 2022-09-18 MED ORDER — BACLOFEN 10 MG PO TABS
40.0000 mg | ORAL_TABLET | Freq: Four times a day (QID) | ORAL | Status: DC
  Administered 2022-09-18 – 2022-09-19 (×3): 40 mg via ORAL
  Filled 2022-09-18 (×3): qty 4

## 2022-09-18 MED ORDER — POTASSIUM CHLORIDE CRYS ER 20 MEQ PO TBCR
40.0000 meq | EXTENDED_RELEASE_TABLET | Freq: Two times a day (BID) | ORAL | Status: AC
  Filled 2022-09-18 (×2): qty 2

## 2022-09-18 MED ORDER — FENTANYL CITRATE (PF) 100 MCG/2ML IJ SOLN: 25.0000 ug | INTRAMUSCULAR | Status: DC | PRN

## 2022-09-18 MED ORDER — ENOXAPARIN SODIUM 40 MG/0.4ML IJ SOSY
40.0000 mg | PREFILLED_SYRINGE | INTRAMUSCULAR | Status: DC
  Administered 2022-09-18 – 2022-09-21 (×4): 40 mg via SUBCUTANEOUS
  Filled 2022-09-18 (×4): qty 0.4

## 2022-09-18 MED ORDER — MIDAZOLAM HCL 2 MG/2ML IJ SOLN
INTRAMUSCULAR | Status: AC
  Filled 2022-09-18: qty 2

## 2022-09-18 MED ORDER — VANCOMYCIN HCL 1500 MG/300ML IV SOLN
1500.0000 mg | Freq: Once | INTRAVENOUS | Status: AC
  Administered 2022-09-18: 1500 mg via INTRAVENOUS
  Filled 2022-09-18: qty 300

## 2022-09-18 MED ORDER — FENTANYL CITRATE (PF) 100 MCG/2ML IJ SOLN
INTRAMUSCULAR | Status: DC | PRN
  Administered 2022-09-18: 25 ug via INTRAVENOUS

## 2022-09-18 MED ORDER — SENNOSIDES-DOCUSATE SODIUM 8.6-50 MG PO TABS
1.0000 | ORAL_TABLET | Freq: Every day | ORAL | Status: DC
  Administered 2022-09-18 – 2022-09-21 (×2): 1 via ORAL
  Filled 2022-09-18 (×5): qty 1

## 2022-09-18 MED ORDER — DAKINS (1/4 STRENGTH) 0.125 % EX SOLN
CUTANEOUS | Status: AC
  Filled 2022-09-18: qty 473

## 2022-09-18 MED ORDER — FENTANYL CITRATE (PF) 100 MCG/2ML IJ SOLN
INTRAMUSCULAR | Status: AC
  Filled 2022-09-18: qty 2

## 2022-09-18 MED ORDER — SODIUM CHLORIDE 0.9 % IV BOLUS (SEPSIS)
1000.0000 mL | Freq: Once | INTRAVENOUS | Status: AC
  Administered 2022-09-18: 1000 mL via INTRAVENOUS

## 2022-09-18 MED ORDER — MIDAZOLAM HCL 2 MG/2ML IJ SOLN
INTRAMUSCULAR | Status: DC | PRN
  Administered 2022-09-18: 2 mg via INTRAVENOUS

## 2022-09-18 MED ORDER — PROPOFOL 500 MG/50ML IV EMUL
INTRAVENOUS | Status: DC | PRN
  Administered 2022-09-18: 100 ug/kg/min via INTRAVENOUS

## 2022-09-18 MED ORDER — POLYETHYLENE GLYCOL 3350 17 G PO PACK
17.0000 g | PACK | Freq: Every day | ORAL | Status: DC
  Administered 2022-09-18 – 2022-09-22 (×4): 17 g via ORAL
  Filled 2022-09-18 (×5): qty 1

## 2022-09-18 MED ORDER — NICOTINE 7 MG/24HR TD PT24
7.0000 mg | MEDICATED_PATCH | Freq: Every day | TRANSDERMAL | Status: DC
  Administered 2022-09-18 – 2022-09-19 (×2): 7 mg via TRANSDERMAL
  Filled 2022-09-18 (×5): qty 1

## 2022-09-18 MED ORDER — VANCOMYCIN HCL IN DEXTROSE 1-5 GM/200ML-% IV SOLN
1000.0000 mg | Freq: Two times a day (BID) | INTRAVENOUS | Status: DC
  Administered 2022-09-19 – 2022-09-21 (×6): 1000 mg via INTRAVENOUS
  Filled 2022-09-18 (×6): qty 200

## 2022-09-18 SURGICAL SUPPLY — 39 items
APL PRP STRL LF DISP 70% ISPRP (MISCELLANEOUS)
BLADE CLIPPER SURG (BLADE) ×3 IMPLANT
BLADE SURG 15 STRL LF DISP TIS (BLADE) ×3 IMPLANT
BLADE SURG 15 STRL SS (BLADE) ×2
BNDG GAUZE DERMACEA FLUFF 4 (GAUZE/BANDAGES/DRESSINGS) IMPLANT
BNDG GZE DERMACEA 4 6PLY (GAUZE/BANDAGES/DRESSINGS)
CANISTER WOUND CARE 500ML ATS (WOUND CARE) ×3 IMPLANT
CHLORAPREP W/TINT 26 (MISCELLANEOUS) ×3 IMPLANT
DRAIN PENROSE 12X.25 LTX STRL (MISCELLANEOUS) IMPLANT
DRAIN PENROSE 5/8X18 LTX STRL (DRAIN) IMPLANT
DRAPE LAPAROTOMY 100X77 ABD (DRAPES) ×3 IMPLANT
DRAPE LAPAROTOMY 77X122 PED (DRAPES) ×3 IMPLANT
DRSG VAC ATS MED SENSATRAC (GAUZE/BANDAGES/DRESSINGS) ×3 IMPLANT
ELECT REM PT RETURN 9FT ADLT (ELECTROSURGICAL) ×2
ELECTRODE REM PT RTRN 9FT ADLT (ELECTROSURGICAL) ×3 IMPLANT
GAUZE 4X4 16PLY ~~LOC~~+RFID DBL (SPONGE) ×3 IMPLANT
GAUZE SPONGE 4X4 12PLY STRL (GAUZE/BANDAGES/DRESSINGS) IMPLANT
GLOVE BIO SURGEON STRL SZ 6.5 (GLOVE) ×3 IMPLANT
GLOVE BIOGEL PI IND STRL 6.5 (GLOVE) ×3 IMPLANT
GOWN STRL REUS W/ TWL LRG LVL3 (GOWN DISPOSABLE) ×6 IMPLANT
GOWN STRL REUS W/TWL LRG LVL3 (GOWN DISPOSABLE) ×4
KIT TURNOVER KIT A (KITS) ×3 IMPLANT
MANIFOLD NEPTUNE II (INSTRUMENTS) ×3 IMPLANT
NEEDLE HYPO 22GX1.5 SAFETY (NEEDLE) ×3 IMPLANT
NS IRRIG 1000ML POUR BTL (IV SOLUTION) ×3 IMPLANT
PACK BASIN MINOR ARMC (MISCELLANEOUS) ×3 IMPLANT
PAD ABD DERMACEA PRESS 5X9 (GAUZE/BANDAGES/DRESSINGS) IMPLANT
PULSAVAC PLUS IRRIG FAN TIP (DISPOSABLE) ×2
SOL PREP PVP 2OZ (MISCELLANEOUS) ×2
SOLUTION PREP PVP 2OZ (MISCELLANEOUS) ×6 IMPLANT
SPONGE T-LAP 18X18 ~~LOC~~+RFID (SPONGE) ×3 IMPLANT
SUT ETHILON 3-0 FS-10 30 BLK (SUTURE)
SUTURE EHLN 3-0 FS-10 30 BLK (SUTURE) IMPLANT
SWAB CULTURE AMIES ANAERIB BLU (MISCELLANEOUS) IMPLANT
SYR 10ML LL (SYRINGE) ×3 IMPLANT
SYR BULB IRRIG 60ML STRL (SYRINGE) ×3 IMPLANT
TIP FAN IRRIG PULSAVAC PLUS (DISPOSABLE) IMPLANT
TIP HIGH FLOW IRRIGATION COAX (MISCELLANEOUS) IMPLANT
TRAP FLUID SMOKE EVACUATOR (MISCELLANEOUS) ×3 IMPLANT

## 2022-09-18 NOTE — Op Note (Addendum)
ATTENDING Surgeon(s): Herbert Pun, MD   ANESTHESIA: General   PRE-OPERATIVE DIAGNOSIS: Left ischial decubitus pressure ulcer   POST-OPERATIVE DIAGNOSIS: Same   PROCEDURE(S):  1.) Sharp excisional debridement of left ischial decubitus pressure ulcer 2.) Deep open Bone biopsy  3.) Negative pressure dressing placement (DME)   INTRAOPERATIVE FINDINGS:  Pre operative measurements:  A 48 sq cm area ulcer with purulent and necrotic tissue   Post operative measurements  A 60 sq cm area stage 4 ulcer       ESTIMATED BLOOD LOSS: 25 mL    SPECIMENS: Ischial tuberosity bone   COMPLICATIONS: None apparent   CONDITION AT END OF PROCEDURE: Hemodynamically stable and awake   INDICATIONS FOR PROCEDURE:  Patient is a 31 year old male with chronic bilateral ischial tuberosity ulcers. Left ischial tuberosity with necrotic and purulent tissue.      DETAILS OF PROCEDURE: After informed consent,patient was taken to the OR. Time out performed. MAC induced. Patient placed on left lateral decubitus position. The left gluteal and posterior thigh area was cleaned and draped in sterile fashion. With #15 blade and scissors, necrotic tissue from the left ischial area was resected. Ischemic fat tissue and muscle and bone were debrided down to the ischial tuberosity bone. Bone biopsy take with Rongeur. Further debridement of the tissue was done with Pulse Lavage. Hemostasis achieved. Negative pressure dressing was applied in steps. Patient tolerated the procedure well.

## 2022-09-18 NOTE — Assessment & Plan Note (Signed)
Smoking cessation has been discussed with patient in detail We will place patient on a nicotine transdermal patch 

## 2022-09-18 NOTE — ED Triage Notes (Addendum)
Pt via POV from home. Pt c/o urinary tract infection. States he has been seen here multiple times and has been started with oral abx twice before but the symptoms have not subsided. Pt states he also has a sacral wound that he would like checked out, states he completed his abx treatment a week ago with no relief. Pt is paraplegic. Pt is A&Ox4 and NAD

## 2022-09-18 NOTE — Assessment & Plan Note (Addendum)
Replacing potassium and magnesium as needed

## 2022-09-18 NOTE — Assessment & Plan Note (Signed)
Treatment as outlined in 2 

## 2022-09-18 NOTE — ED Notes (Signed)
Patient wet self after given urine sample. Patient given warm wipes, cleaned self, and assisted into new brief. Warm blankets given at this time.

## 2022-09-18 NOTE — Consult Note (Signed)
WOC Nurse Consult Note: Patient receiving care in [facility] ED7. Reason for Consult: right ischial wound Wound type: very nearly healed PI to right ischium. Left ischium to be operated on by Dr. Windell Moment. Pressure Injury POA: Yes Measurement: Deep int the right ischial wound there are 2 fissures that just need to re-epithelialize to be completely healed.  Both wound beds are pink, there is no drainage, no odor.  The patient tells me he just "keeps it covered". Today there is dry gauze and an ABD pad over the area. Wound bed: pink Drainage (amount, consistency, odor) none Periwound: contracted Dressing procedure/placement/frequency: Place DRY 2 x 2 gauzes into the deep crevices of the RIGHT ischial wound. Cover with folded ABD pad, tape in place.  I have also ordered a low air loss mattress for pressure reduction, and the use of bilateral prevalon heel lift boots.  Monitor the wound area(s) for worsening of condition such as: Signs/symptoms of infection,  Increase in size,  Development of or worsening of odor, Development of pain, or increased pain at the affected locations.  Notify the medical team if any of these develop.  Thank you for the consult.  Discussed plan of care with the patient.  [place] nurse will not follow at this time.  Please re-consult the [place] team if needed.  Val Riles, RN, MSN, CWOCN, CNS-BC, pager [phone]

## 2022-09-18 NOTE — ED Provider Notes (Signed)
[facility] Provider Note    Event Date/Time   First MD Initiated Contact with Patient 09/18/22 [phone]     (approximate)   History   Chief Complaint: Hematuria   HPI  [name] is a 31 y.o. male with a past history of paraplegia with neurogenic bowel and bladder and chronic sacral decubitus ulcers who comes the ED complaining of persistent UTI despite 2 courses of oral antibiotics.  He notes that his urine is cloudy, and he has chills and sweats at home.  Reports eating okay, no vomiting.  Denies pain but also notes that he has very limited pain perception.  He also reports that he is worried that his decubitus wound is getting infected due to a foul odor.  He lives at home and has not been able to go to wound care regularly.     Physical Exam   Triage Vital Signs: ED Triage Vitals  Enc Vitals Group     BP 09/18/22 0911 139/87     Pulse Rate 09/18/22 0911 100     Resp 09/18/22 0911 16     Temp 09/18/22 0911 98.1 F (36.7 C)     Temp Source 09/18/22 0911 Oral     SpO2 09/18/22 0911 100 %     Weight 09/18/22 0911 135 lb (61.2 kg)     Height 09/18/22 0911 6' (1.829 m)     Head Circumference --      Peak Flow --      Pain Score 09/18/22 0913 0     Pain Loc --      Pain Edu? --      Excl. in GC? --     Most recent vital signs: Vitals:   09/18/22 0911 09/18/22 1000  BP: 139/87 110/69  Pulse: 100 97  Resp: 16 16  Temp: 98.1 F (36.7 C)   SpO2: 100% 99%    General: Awake, no distress.  CV:  Good peripheral perfusion.  Regular rate and rhythm Resp:  Normal effort.  Clear to auscultation bilaterally Abd:  No distention.  Soft nontender.  Condom catheter in place Other:  Stage III decubitus ulcer overlying the right ischium with healthy soft tissue and granulation.  There is a stage IV decubitus ulcer overlying the left ischium with extensive necrotic tissue and purulent drainage.  No crepitus.  There is surrounding erythema and  warmth.   ED Results / Procedures / Treatments   Labs (all labs ordered are listed, but only abnormal results are displayed) Labs Reviewed  URINALYSIS, ROUTINE W REFLEX MICROSCOPIC - Abnormal; Notable for the following components:      Result Value   Color, Urine AMBER (*)    APPearance TURBID (*)    Hgb urine dipstick LARGE (*)    Ketones, ur 5 (*)    Protein, ur 100 (*)    Nitrite POSITIVE (*)    Leukocytes,Ua LARGE (*)    RBC / HPF >50 (*)    WBC, UA >50 (*)    Bacteria, UA RARE (*)    All other components within normal limits  BASIC METABOLIC PANEL - Abnormal; Notable for the following components:   Potassium 3.3 (*)    CO2 21 (*)    Calcium 8.8 (*)    All other components within normal limits  CBC - Abnormal; Notable for the following components:   WBC 17.8 (*)    Hemoglobin 11.2 (*)    HCT 37.0 (*)  MCV 72.5 (*)    MCH 22.0 (*)    Platelets 551 (*)    All other components within normal limits  HIV ANTIBODY (ROUTINE TESTING W REFLEX)  MAGNESIUM     EKG    RADIOLOGY    PROCEDURES:  Procedures   MEDICATIONS ORDERED IN ED: Medications  sodium chloride 0.9 % bolus 1,000 mL (1,000 mLs Intravenous New Bag/Given 09/18/22 1222)  enoxaparin (LOVENOX) injection 40 mg (has no administration in time range)  acetaminophen (TYLENOL) tablet 650 mg (has no administration in time range)    Or  acetaminophen (TYLENOL) suppository 650 mg (has no administration in time range)  ondansetron (ZOFRAN) tablet 4 mg (has no administration in time range)    Or  ondansetron (ZOFRAN) injection 4 mg (has no administration in time range)  0.9 %  sodium chloride infusion (has no administration in time range)  cefTRIAXone (ROCEPHIN) 1 g in sodium chloride 0.9 % 100 mL IVPB (has no administration in time range)  oxyCODONE (Oxy IR/ROXICODONE) immediate release tablet 10 mg (has no administration in time range)  polyethylene glycol (MIRALAX / GLYCOLAX) packet 17 g (has no  administration in time range)  senna-docusate (Senokot-S) tablet 1 tablet (has no administration in time range)  baclofen (LIORESAL) tablet 40 mg (has no administration in time range)  multivitamin with minerals tablet 1 tablet (has no administration in time range)  potassium chloride SA (KLOR-CON M) CR tablet 40 mEq (has no administration in time range)  nicotine (NICODERM CQ - dosed in mg/24 hr) patch 7 mg (has no administration in time range)  vancomycin (VANCOREADY) IVPB 1500 mg/300 mL (1,500 mg Intravenous New Bag/Given 09/18/22 1221)     IMPRESSION / MDM / ASSESSMENT AND PLAN / ED COURSE  I reviewed the triage vital signs and the nursing notes.                              Differential diagnosis includes, but is not limited to, UTI, infected decubitus wound, dehydration, AKI, electrolyte abnormality  Patient's presentation is most consistent with acute presentation with potential threat to life or bodily function.  Patient presents with persistent cloudy urine.  Urinalysis does show evidence of pyuria and likely cystitis.  His left decubitus wound is necrotic and clearly infected.  No signs of necrotizing fasciitis currently.  He also has a leukocytosis of 18,000.  Concern for MDR infection.  Vancomycin and meropenem are ordered.  Case discussed with general surgery and hospitalist for further evaluation and management.  He is not currently septic.       FINAL CLINICAL IMPRESSION(S) / ED DIAGNOSES   Final diagnoses:  Failure of outpatient treatment  Paraplegia (HCC)  Pressure injury of left buttock, stage 4 (HCC)  Pressure injury of right buttock, stage 3 ([place])  Cystitis     Rx / DC Orders   ED Discharge Orders     None        Note:  This document was prepared using Dragon voice recognition software and may include unintentional dictation errors.   Carrie Mew, MD 09/18/22 [phone]
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
## 2022-09-18 NOTE — Transfer of Care (Signed)
Immediate Anesthesia Transfer of Care Note  Patient: [name]  Procedure(s) Performed: DEBRIDMENT OF LEFT DECUBITUS ULCER (Buttocks) APPLICATION OF WOUND VAC (Buttocks)  Patient Location: PACU  Anesthesia Type:General  Level of Consciousness: awake and alert   Airway & Oxygen Therapy: Patient Spontanous Breathing and Patient connected to face mask oxygen  Post-op Assessment: Report given to RN and Post -op Vital signs reviewed and stable  Post vital signs: Reviewed and stable  Last Vitals:  Vitals Value Taken Time  BP 115/71 09/18/22 1547  Temp    Pulse 71 09/18/22 1551  Resp 15 09/18/22 1551  SpO2 100 % 09/18/22 1551  Vitals shown include unvalidated device data.  Last Pain:  Vitals:   09/18/22 1348  TempSrc: Temporal  PainSc: 0-No pain         Complications: No notable events documented.

## 2022-09-18 NOTE — Progress Notes (Signed)
Pharmacy Antibiotic Note  [name] is a 31 y.o. male admitted on 09/18/2022 with decubitis stage IV ulcer of ischium with drainage concerning for infection and possible acute on chronic UTI. PMH paraplegia, neurogenic bladder, stage I pressure ulcer of sacral region, injury of thoracic spinal cord. Pharmacy has been consulted for vancomycin dosing.   Plan: Vancomycin 1500 mg loading dose  Vancomycin 1,000 mg every 12 hours TBW; Vd 0.72 L/kg; Scr 0.8 Goal AUC 400-600 Estimated AUC 455.5, Cmin 10.9  Also on ceftriaxone 1 gram every 24 hours  Follow renal function and clinical course  Height: 6' (182.9 cm) Weight: 61.2 kg (135 lb) IBW/kg (Calculated) : 77.6  Temp (24hrs), Avg:98.1 F (36.7 C), Min:98.1 F (36.7 C), Max:98.1 F (36.7 C)  Recent Labs  Lab 09/18/22 0919  WBC 17.8*  CREATININE 0.85    Estimated Creatinine Clearance: 108 mL/min (by C-G formula based on SCr of 0.85 mg/dL).    Allergies  Allergen Reactions   Ciprofloxacin Itching and Other (See Comments)    Itching and phlebitis at IV site    Antimicrobials this admission: vancomycin 10/23 >>  ceftriaxone 10/23 >>  Meropenem ordered 10/23 (no doses given)  Dose adjustments this admission: N/a  Microbiology results: None  Thank you for allowing pharmacy to be a part of this patient's care.   Glean Salvo, PharmD Clinical Pharmacist  09/18/2022 11:51 AM

## 2022-09-18 NOTE — Assessment & Plan Note (Addendum)
Patient is paraplegic and has several decubitus ulcers including area of ulceration over the right ischium with necrotic tissue and foul-smelling drainage concerning for infection.  Postsurgical debridement and wound VAC.  VAC changed on 10/25.  Initial plan was to keep patient in hospital and changed wound VAC again on 10/30 and then discharged with wound VAC to be changed on 11/3.   However, patient eager to not stay in the hospital.  Wound care able to meet with patient and fianc and she received training on how to use wound VAC.  Plan will be to change wound VAC on Mondays Wednesdays and Fridays.  Patient's first follow-up with wound care clinic is on 11/2.  Continue antibiotics x3 more days for total of 7 days of therapy.

## 2022-09-18 NOTE — Anesthesia Procedure Notes (Signed)
Date/Time: 09/18/2022 2:30 PM  Performed by: Johnna Acosta, CRNAPre-anesthesia Checklist: Patient identified, Emergency Drugs available, Suction available, Patient being monitored and Timeout performed Patient Re-evaluated:Patient Re-evaluated prior to induction Oxygen Delivery Method: Simple face mask Preoxygenation: Pre-oxygenation with 100% oxygen Induction Type: IV induction
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
## 2022-09-18 NOTE — Progress Notes (Signed)
PHARMACY -  BRIEF ANTIBIOTIC NOTE   Pharmacy has received consult(s) for vancomycin and meropenem from an ED provider.  The patient's profile has been reviewed for ht/wt/allergies/indication/available labs.    One time order(s) placed for meropenem 1 gram x 1, vancomycin 1,000 mg x 1  Further antibiotics/pharmacy consults should be ordered by admitting physician if indicated.                       Thank you, Wynelle Cleveland 09/18/2022  10:32 AM
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
## 2022-09-18 NOTE — ED Notes (Signed)
This RN and Lattie Haw, RN attempted for IV with no success. Patient refusing to left this RN attempt for 2nd try and refusing IV in hand. IV team consult placed at this time.

## 2022-09-18 NOTE — Assessment & Plan Note (Addendum)
Patient with history of neurogenic bladder and significant pyuria on admission, foul-smelling.  Currently on empiric Rocephin.  Prior urine culture from 06/28 yielded pansensitive Proteus mirabilis. We will place patient empirically on Rocephin 1 g IV daily Urine cultures unequivocal.  Antibiotics x3 more days by mouth to complete 7 days of therapy

## 2022-09-19 ENCOUNTER — Encounter: Payer: Self-pay | Admitting: General Surgery

## 2022-09-19 DIAGNOSIS — E46 Unspecified protein-calorie malnutrition: Secondary | ICD-10-CM | POA: Diagnosis present

## 2022-09-19 DIAGNOSIS — N3090 Cystitis, unspecified without hematuria: Secondary | ICD-10-CM

## 2022-09-19 DIAGNOSIS — N319 Neuromuscular dysfunction of bladder, unspecified: Secondary | ICD-10-CM | POA: Diagnosis not present

## 2022-09-19 DIAGNOSIS — G8220 Paraplegia, unspecified: Secondary | ICD-10-CM

## 2022-09-19 DIAGNOSIS — A419 Sepsis, unspecified organism: Secondary | ICD-10-CM | POA: Diagnosis not present

## 2022-09-19 DIAGNOSIS — L89320 Pressure ulcer of left buttock, unstageable: Secondary | ICD-10-CM

## 2022-09-19 LAB — BASIC METABOLIC PANEL
Anion gap: 7 (ref 5–15)
BUN: 11 mg/dL (ref 6–20)
CO2: 21 mmol/L — ABNORMAL LOW (ref 22–32)
Calcium: 7.9 mg/dL — ABNORMAL LOW (ref 8.9–10.3)
Chloride: 106 mmol/L (ref 98–111)
Creatinine, Ser: 0.74 mg/dL (ref 0.61–1.24)
GFR, Estimated: >60 mL/min (ref >60)
Glucose, Bld: 82 mg/dL (ref 70–99)
Potassium: 3.6 mmol/L (ref 3.5–5.1)
Sodium: 134 mmol/L — ABNORMAL LOW (ref 135–145)

## 2022-09-19 LAB — CBC
HCT: 29.7 % — ABNORMAL LOW (ref 39.0–52.0)
Hemoglobin: 9.3 g/dL — ABNORMAL LOW (ref 13.0–17.0)
MCH: 22.2 pg — ABNORMAL LOW (ref 26.0–34.0)
MCHC: 31.3 g/dL (ref 30.0–36.0)
MCV: 71.1 fL — ABNORMAL LOW (ref 80.0–100.0)
Platelets: 445 10*3/uL — ABNORMAL HIGH (ref 150–400)
RBC: 4.18 MIL/uL — ABNORMAL LOW (ref 4.22–5.81)
RDW: 13.9 % (ref 11.5–15.5)
WBC: 13.8 10*3/uL — ABNORMAL HIGH (ref 4.0–10.5)
nRBC: 0 % (ref 0.0–0.2)

## 2022-09-19 LAB — PROCALCITONIN: Procalcitonin: <0.1 ng/mL

## 2022-09-19 LAB — LACTIC ACID, PLASMA: Lactic Acid, Venous: 0.6 mmol/L (ref 0.5–1.9)

## 2022-09-19 MED ORDER — ENSURE ENLIVE PO LIQD
237.0000 mL | Freq: Three times a day (TID) | ORAL | Status: DC
  Administered 2022-09-20 – 2022-09-22 (×7): 237 mL via ORAL

## 2022-09-19 MED ORDER — VITAMIN C 500 MG PO TABS
500.0000 mg | ORAL_TABLET | Freq: Two times a day (BID) | ORAL | Status: DC
  Administered 2022-09-20 – 2022-09-21 (×4): 500 mg via ORAL
  Filled 2022-09-19 (×5): qty 1

## 2022-09-19 MED ORDER — ZINC SULFATE 220 (50 ZN) MG PO CAPS
220.0000 mg | ORAL_CAPSULE | Freq: Every day | ORAL | Status: DC
  Filled 2022-09-19 (×2): qty 1

## 2022-09-19 MED ORDER — BACLOFEN 10 MG PO TABS
40.0000 mg | ORAL_TABLET | Freq: Four times a day (QID) | ORAL | Status: DC
  Administered 2022-09-19 – 2022-09-22 (×12): 40 mg via ORAL
  Filled 2022-09-19 (×2): qty 2
  Filled 2022-09-19 (×5): qty 4
  Filled 2022-09-19: qty 2
  Filled 2022-09-19 (×2): qty 4
  Filled 2022-09-19 (×2): qty 2
  Filled 2022-09-19: qty 4

## 2022-09-19 NOTE — Progress Notes (Signed)
Patient is alert and oriented x4. Frequently asks for assistance. Placed patient on air mattress bed as requested. Placed dressing to R-buttocks according to order. Patient complained of having pain. When asked what does it feel like and if he had feeling in his buttock area, he stated that he could not feel it but that the sweating that he has lets him know that he is hurting. Gave prn pain med. He had bowel movement x2. Denied additional needs.
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
## 2022-09-19 NOTE — Consult Note (Signed)
[facility] Nurse Consult Note: Patient receiving care in [facility]. Reason for Consult: FIRST VAC change to left ischial wound Wednesday 09/20/22. Wound type: surgical I have placed the patient on the [place] communication f/u list for [facility] change tomorrow 09/20/22.  Val Riles, RN, MSN, CWOCN, CNS-BC, pager [phone]
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
## 2022-09-19 NOTE — Assessment & Plan Note (Addendum)
Patient met criteria for sepsis on admission given leukocytosis, urine versus decubitus ulcer source, heart rate greater than 90 and temperature greater than 100.5.  Sepsis itself has resolved.  Treating underlying infection.  Patient completed 4 days of IV Rocephin and Flagyl.  Discharged on 3 more days of Flagyl and Augmentin.  Wound care as below.
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
## 2022-09-19 NOTE — Progress Notes (Addendum)
Gave patient's pain med upon request. Patient inquired about receiving baclofen and I explained to him the times of the doses. Patient states that that is different than his home dose. Educated him regarding hospital med scheduling. Patient will speak to provider upon rounding regarding baclofen admin times. Temperature was elevated but not within the parameters for tylenol which is 101 and above. Will inform day shift to monitor.

## 2022-09-19 NOTE — Assessment & Plan Note (Signed)
  Nutrition to see 

## 2022-09-19 NOTE — Plan of Care (Signed)
Patient remained free of falls this shift. Wound vac in place. Dressing to right buttock area. Pain meds given throughout day. IV ABX infused with no issues. VSS. Will continue to monitor.

## 2022-09-20 DIAGNOSIS — N319 Neuromuscular dysfunction of bladder, unspecified: Secondary | ICD-10-CM | POA: Diagnosis not present

## 2022-09-20 DIAGNOSIS — L89320 Pressure ulcer of left buttock, unstageable: Secondary | ICD-10-CM | POA: Diagnosis not present

## 2022-09-20 DIAGNOSIS — N3090 Cystitis, unspecified without hematuria: Secondary | ICD-10-CM | POA: Diagnosis not present

## 2022-09-20 DIAGNOSIS — A419 Sepsis, unspecified organism: Secondary | ICD-10-CM | POA: Diagnosis not present

## 2022-09-20 DIAGNOSIS — E440 Moderate protein-calorie malnutrition: Secondary | ICD-10-CM | POA: Diagnosis present

## 2022-09-20 LAB — SURGICAL PATHOLOGY

## 2022-09-20 LAB — VITAMIN D 25 HYDROXY (VIT D DEFICIENCY, FRACTURES): Vit D, 25-Hydroxy: 6.02 ng/mL — ABNORMAL LOW (ref 30–100)

## 2022-09-20 MED ORDER — MELATONIN 5 MG PO TABS
5.0000 mg | ORAL_TABLET | Freq: Every evening | ORAL | Status: DC | PRN
  Administered 2022-09-20: 5 mg via ORAL
  Filled 2022-09-20: qty 1

## 2022-09-20 NOTE — Progress Notes (Signed)
Patient ID: [name], male   DOB: [birth year], 31 y.o.   MRN: [id number]     [facility] Hospital Day(s): 2.   Interval History: Patient seen and examined, no acute events or new complaints overnight. Patient reports feeling well. No issues with wound VAC changes.   Vital signs in last 24 hours: [min-max] current  Temp:  [97.4 F (36.3 C)-98.1 F (36.7 C)] 97.9 F (36.6 C) (10/25 1558) Pulse Rate:  [63-83] 68 (10/25 1558) Resp:  [15-20] 19 (10/25 1558) BP: (105-136)/(56-80) 106/56 (10/25 1558) SpO2:  [97 %-100 %] 99 % (10/25 1558)     Height: 6' (182.9 cm) Weight: 61.2 kg BMI (Calculated): 18.31   Physical Exam:  Constitutional: alert, cooperative and no distress  Extremity: left ischial area with negative pressure dressing in place no erythema   Labs:     Latest Ref Rng & Units 09/19/2022    5:38 AM 09/18/2022    9:19 AM 08/25/2022    4:10 PM  CBC  WBC 4.0 - 10.5 K/uL 13.8  17.8  10.2   Hemoglobin 13.0 - 17.0 g/dL 9.3  11.2  12.6   Hematocrit 39.0 - 52.0 % 29.7  37.0  41.6   Platelets 150 - 400 K/uL 445  551  438       Latest Ref Rng & Units 09/19/2022    5:38 AM 09/18/2022    9:19 AM 08/25/2022    4:10 PM  CMP  Glucose 70 - 99 mg/dL 82  83  95   BUN 6 - 20 mg/dL 11  13  14    Creatinine 0.61 - 1.24 mg/dL 0.74  0.85  0.83   Sodium 135 - 145 mmol/L 134  137  139   Potassium 3.5 - 5.1 mmol/L 3.6  3.3  3.9   Chloride 98 - 111 mmol/L 106  106  107   CO2 22 - 32 mmol/L 21  21  26    Calcium 8.9 - 10.3 mg/dL 7.9  8.8  8.8   Total Protein 6.5 - 8.1 g/dL   8.1   Total Bilirubin 0.3 - 1.2 mg/dL   0.9   Alkaline Phos 38 - 126 U/L   61   AST 15 - 41 U/L   16   ALT 0 - 44 U/L   11     Imaging studies: No new pertinent imaging studies   Assessment/Plan:  31 y.o. male with left ischial pressure ulcer 2 Days Post-Op s/p cleansing and debridement and negative pressure dressing placement, complicated by pertinent comorbidities including chronic recurrent  UTI.  Negative pressure dressing changed today by wound care nurse.  There was no issues or complications during exchange.  I agree with plan to continue negative pressure dressings as planned.   Arnold Long, MD

## 2022-09-20 NOTE — TOC Progression Note (Signed)
Transition of Care [facility]) - Progression Note    Patient Details  Name: [name] MRN: [id number] Date of Birth: January 23, 1991  Transition of Care [facility]) CM/SW Contact  Beverly Sessions, RN Phone Number: 09/20/2022, 2:20 PM  Clinical Narrative:     Medicaid vac form completed and secure emailed to tracy with [employer] Patient is not wanting to remain inpatient until Monday  He states that his fiances aunt is a nurse and will be able to manage the wound vac.  Per Olivia Mackie with [employer] she would need to come and be educated on the vac prior to discharge MD and surgeon in agreement Vac will be delivered tomorrow morning Patient request that I call his fiances aunt [facility]) and arrange a time for her to come. [phone] her voicemail box is full so I sent a text requesting a return call Per Farmer RN Melody should would be available tomorrow at 1030 or 11 for education  Expected Discharge Plan: [place] Barriers to Discharge: Continued Medical Work up  Expected Discharge Plan and Services Expected Discharge Plan: [place] arrangements for the past 2 months: Apartment                                       Social Determinants of Health (SDOH) Interventions    Readmission Risk Interventions    10/15/2020    3:33 PM  Readmission Risk Prevention Plan  Transportation Screening Complete  PCP or Specialist Appt within 5-7 Days Complete  Home Care Screening Complete  Medication Review (RN CM) Complete
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
## 2022-09-20 NOTE — Assessment & Plan Note (Signed)
Nutrition Status: Nutrition Problem: Moderate Malnutrition Etiology: chronic illness (chronic non-healing wounds) Signs/Symptoms: moderate fat depletion, moderate muscle depletion, severe muscle depletion   Nutrition following.  On Magic cup 3 times daily plus Ensure Enlive and vitamin supplementation.

## 2022-09-20 NOTE — Progress Notes (Signed)
Cross Cover Patient request sleep aide. Melatonin ordered as needed at bedtime

## 2022-09-20 NOTE — Consult Note (Signed)
[facility] Nurse Consult Note: Reason for Consult: NPWT left ischium; s/p debridement 10/23 Patient with history of multiple pressure injuries that have all healed, this wound is self reported to have begun about a month ago Wound type: Stage 4 Pressure Injury  Pressure Injury POA: Yes Measurement: 5cm x 3cm x 2.5cm  Wound JOA:CZYSA, pink, dark in base appears to be from cautery during surgery; oozing at one site, pressure held Drainage (amount, consistency, odor) serosanguinous in canister, no odor Periwound: intact, evidence of previous full thickness wounds Dressing procedure/placement/frequency: Removed old NPWT dressing, verified with surgeon. 1pc of black foam removed Patient does not want the NPWT dressing bridged to the hip despite my explaining rationale for this Filled wound with  _1__ piece of black foam Sealed NPWT dressing at 164mm HG Patient received IV/PO pain medication per bedside nurse prior to dressing change Patient tolerated procedure well; patient does not have sensation in this area but does request pain meds and antispasmodics prior to dressing change Patient is able to turn and reposition himself well for dressing change. He also reports to me that he has a "nurse that specializes in wound care" that can change the NPWT dressing at home, he would like to be DCed soon.  I have relayed that message to the surgeon and  [facility] staff.  This dressing is a non complicated NPWT dressing and could be managed by the bedside nurse.  [place], [place], [place]

## 2022-09-21 ENCOUNTER — Other Ambulatory Visit (HOSPITAL_COMMUNITY): Payer: Self-pay

## 2022-09-21 DIAGNOSIS — L89324 Pressure ulcer of left buttock, stage 4: Secondary | ICD-10-CM | POA: Diagnosis not present

## 2022-09-21 DIAGNOSIS — L89320 Pressure ulcer of left buttock, unstageable: Secondary | ICD-10-CM | POA: Diagnosis not present

## 2022-09-21 DIAGNOSIS — G8220 Paraplegia, unspecified: Secondary | ICD-10-CM | POA: Diagnosis not present

## 2022-09-21 LAB — CBC
HCT: 32 % — ABNORMAL LOW (ref 39.0–52.0)
Hemoglobin: 9.9 g/dL — ABNORMAL LOW (ref 13.0–17.0)
MCH: 22.2 pg — ABNORMAL LOW (ref 26.0–34.0)
MCHC: 30.9 g/dL (ref 30.0–36.0)
MCV: 71.7 fL — ABNORMAL LOW (ref 80.0–100.0)
Platelets: 509 10*3/uL — ABNORMAL HIGH (ref 150–400)
RBC: 4.46 MIL/uL (ref 4.22–5.81)
RDW: 14.3 % (ref 11.5–15.5)
WBC: 6.5 10*3/uL (ref 4.0–10.5)
nRBC: 0 % (ref 0.0–0.2)

## 2022-09-21 LAB — BASIC METABOLIC PANEL
Anion gap: 6 (ref 5–15)
BUN: 10 mg/dL (ref 6–20)
CO2: 28 mmol/L (ref 22–32)
Calcium: 8.4 mg/dL — ABNORMAL LOW (ref 8.9–10.3)
Chloride: 106 mmol/L (ref 98–111)
Creatinine, Ser: 0.75 mg/dL (ref 0.61–1.24)
GFR, Estimated: >60 mL/min (ref >60)
Glucose, Bld: 113 mg/dL — ABNORMAL HIGH (ref 70–99)
Potassium: 3.9 mmol/L (ref 3.5–5.1)
Sodium: 140 mmol/L (ref 135–145)

## 2022-09-21 MED ORDER — VITAMIN D 25 MCG (1000 UNIT) PO TABS
5000.0000 [IU] | ORAL_TABLET | Freq: Every day | ORAL | Status: DC
  Filled 2022-09-21: qty 5

## 2022-09-21 MED ORDER — METRONIDAZOLE 500 MG PO TABS
500.0000 mg | ORAL_TABLET | Freq: Two times a day (BID) | ORAL | Status: DC
  Administered 2022-09-21 – 2022-09-22 (×2): 500 mg via ORAL
  Filled 2022-09-21 (×2): qty 1

## 2022-09-21 NOTE — Progress Notes (Signed)
Pts IV infiltrated. Pt is a difficult stick so an IV team consult was placed. When IV team nurse came around pt refused IV and stated he wanted to wait until the morning to get another one.
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
## 2022-09-21 NOTE — TOC Progression Note (Addendum)
Transition of Care [facility]) - Progression Note    Patient Details  Name: CATCHER DEHOYOS MRN: [id number] Date of Birth: [birth year]  Transition of Care [facility]) CM/SW Contact  Candie Chroman, LCSW Phone Number: 09/21/2022, 10:19 AM  Clinical Narrative:  Home wound vac has been delivered to the room. Tried calling fiance's aunt, Hope, but voicemail is full. Sent her text message asking for call back.   11:07 am: Met with patient. Provided update and asked him to have [place] call back if he talks to her. Patient asked if his fiance' could provide dressing chances if given education. He did confirm both fiance' and her aunt would not be able to come in tomorrow morning for teaching.  Expected Discharge Plan: [place] Barriers to Discharge: Continued Medical Work up  Expected Discharge Plan and Services Expected Discharge Plan: [place] arrangements for the past 2 months: Apartment                                       Social Determinants of Health (SDOH) Interventions    Readmission Risk Interventions    10/15/2020    3:33 PM  Readmission Risk Prevention Plan  Transportation Screening Complete  PCP or Specialist Appt within 5-7 Days Complete  Home Care Screening Complete  Medication Review (RN CM) Complete

## 2022-09-21 NOTE — Progress Notes (Signed)
Consulted for I.V. restart. The patient  requested r/s be done in the morning. RN aware. Will consult when patient ready.

## 2022-09-22 DIAGNOSIS — E440 Moderate protein-calorie malnutrition: Secondary | ICD-10-CM

## 2022-09-22 DIAGNOSIS — A419 Sepsis, unspecified organism: Secondary | ICD-10-CM | POA: Diagnosis not present

## 2022-09-22 DIAGNOSIS — G8220 Paraplegia, unspecified: Secondary | ICD-10-CM | POA: Diagnosis not present

## 2022-09-22 DIAGNOSIS — L89320 Pressure ulcer of left buttock, unstageable: Secondary | ICD-10-CM | POA: Diagnosis not present

## 2022-09-22 LAB — AEROBIC/ANAEROBIC CULTURE W GRAM STAIN (SURGICAL/DEEP WOUND): Gram Stain: NONE SEEN

## 2022-09-22 LAB — VITAMIN E
Vitamin E (Alpha Tocopherol): 7.3 mg/L (ref 5.9–19.4)
Vitamin E(Gamma Tocopherol): 1.5 mg/L (ref 0.7–4.9)

## 2022-09-22 LAB — VITAMIN A: Vitamin A (Retinoic Acid): 9.3 ug/dL — ABNORMAL LOW (ref 18.9–57.3)

## 2022-09-22 MED ORDER — AMOXICILLIN-POT CLAVULANATE 875-125 MG PO TABS: 1.0000 | ORAL_TABLET | Freq: Two times a day (BID) | ORAL | 0 refills | Status: DC

## 2022-09-22 MED ORDER — METHOCARBAMOL 500 MG PO TABS
500.0000 mg | ORAL_TABLET | Freq: Once | ORAL | Status: AC
  Administered 2022-09-22: 500 mg via ORAL
  Filled 2022-09-22: qty 1

## 2022-09-22 MED ORDER — METRONIDAZOLE 500 MG PO TABS: 500.0000 mg | ORAL_TABLET | Freq: Two times a day (BID) | ORAL | 0 refills | Status: AC

## 2022-09-22 MED ORDER — VITAMIN D3 25 MCG PO TABS: 5000.0000 [IU] | ORAL_TABLET | Freq: Every day | ORAL | 1 refills | Status: DC

## 2022-09-22 MED ORDER — ASCORBIC ACID 500 MG PO TABS: 500.0000 mg | ORAL_TABLET | Freq: Two times a day (BID) | ORAL | 1 refills | Status: DC

## 2022-09-22 MED ORDER — OXYCODONE HCL 10 MG PO TABS: 10.0000 mg | ORAL_TABLET | Freq: Four times a day (QID) | ORAL | 0 refills | Status: DC | PRN

## 2022-09-22 MED ORDER — ZINC SULFATE 220 (50 ZN) MG PO CAPS: 220.0000 mg | ORAL_CAPSULE | Freq: Every day | ORAL | 1 refills | Status: DC

## 2022-09-22 MED ORDER — ENSURE ENLIVE PO LIQD: 237.0000 mL | Freq: Three times a day (TID) | ORAL | 30 refills | Status: DC
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
## 2022-09-22 NOTE — Progress Notes (Signed)
Patient discharged to home via family/private with all belongings. Patient expressed full understanding of wound vac teaching from [place], [place] nurse. Patient received all supplies needed to manage wounds at this time. PIVX1 removed with cathter intact. AVS reviewed with patient and family and patient expressed full understanding. No complications at the time of discharge.

## 2022-09-22 NOTE — TOC Progression Note (Addendum)
Transition of Care [facility]) - Progression Note    Patient Details  Name: [name] MRN: [id number] Date of Birth: [birth year]  Transition of Care [facility]) CM/SW [place], LCSW Phone Number: 09/22/2022, 8:56 AM  Clinical Narrative:   [facility] liaison is aware of plan for [place] RN to complete vac dressing change education with fiance' this afternoon.   3:33 pm: Tried calling [facility] x 2 to see what time patient's appt is on 11/2. Left a voicemail.   Expected Discharge Plan: [place] Barriers to Discharge: Continued Medical Work up  Expected Discharge Plan and Services Expected Discharge Plan: [place] arrangements for the past 2 months: Apartment                                       Social Determinants of Health (SDOH) Interventions    Readmission Risk Interventions    10/15/2020    3:33 PM  Readmission Risk Prevention Plan  Transportation Screening Complete  PCP or Specialist Appt within 5-7 Days Complete  Home Care Screening Complete  Medication Review (RN CM) Complete

## 2022-09-22 NOTE — Consult Note (Signed)
[place] Nurse wound follow up Wound type: surgical  Measurement: see Wednesday note  Wound bed: mostly clean; yellow in the base Drainage (amount, consistency, odor) serosanguinous  Periwound: intact  Dressing procedure/placement/frequency: Removed old NPWT dressing Patient does not want dressing bridged, and because family is learning to do this I would not suggest bridging (make the dressing much more complicated) Filled wound with  _1__ piece of black foam, Sealed NPWT dressing at 123mm HG Patient received PO pain medication per bedside nurse prior to dressing change Patient tolerated procedure well  Family to perform VAC dressings per patient's request. Unsure of DC follow up plan at this time. Patient's girlfriend at bedside to learn dressing change.  TOC nurse to arrange follow up as needed. Hospitalist aware of teaching session. [facility] staff aware that family is learning to perform NPWT dressings. No HHRN involved   Discussed POC with patient and bedside nurse.  Re consult if needed, will not follow at this time. Thanks  Vedanshi Massaro [employer], RN,CWOCN, CNS, [place] [phone])

## 2022-09-22 NOTE — TOC Transition Note (Signed)
Transition of Care [facility]) - CM/SW Discharge Note   Patient Details  Name: [name] MRN: [id number] Date of Birth: 1991-02-14  Transition of Care [facility]) CM/SW Contact:  Candie Chroman, LCSW Phone Number: 09/22/2022, 4:00 PM   Clinical Narrative:  Patient has orders to discharge home today. [facility] liaison is aware. Tried calling wound care clinic again but no answer. Sent secure chat to PA at wound care clinic to see if we can provide update that way. No further concerns. CSW signing off.  Final next level of care: Home/Self Care Barriers to Discharge: Barriers Resolved   Patient Goals and CMS Choice        Discharge Placement                Patient to be transferred to facility by: Fiance'   Patient and family notified of of transfer: 09/22/22  Discharge Plan and Services                DME Arranged: Vac DME Agency: [facility] Date DME Agency Contacted: 09/22/22   Representative spoke with at DME Agency: Leontine Locket            Social Determinants of Health ([place]) Interventions     Readmission Risk Interventions    10/15/2020    3:33 PM  Readmission Risk Prevention Plan  Transportation Screening Complete  PCP or Specialist Appt within 5-7 Days Complete  Home Care Screening Complete  Medication Review (RN CM) Complete

## 2022-09-22 NOTE — Plan of Care (Signed)

## 2022-09-22 NOTE — TOC Progression Note (Deleted)
Transition of Care [facility]) - Progression Note    Patient Details  Name: [name] MRN: [id number] Date of Birth: 25-Jun-1991  Transition of Care [facility]) CM/SW [place], LCSW Phone Number: 09/22/2022, 3:58 PM  Clinical Narrative:  Patient has orders to discharge home today. [facility] liaison is aware. Tried calling around care clinic again but no answer. Sent secure chat to PA at wound care clinic to see if we can provide update that way.  Expected Discharge Plan: [place] Barriers to Discharge: Continued Medical Work up  Expected Discharge Plan and Services Expected Discharge Plan: [place] arrangements for the past 2 months: Apartment Expected Discharge Date: 09/22/22                                     Social Determinants of Health (SDOH) Interventions    Readmission Risk Interventions    10/15/2020    3:33 PM  Readmission Risk Prevention Plan  Transportation Screening Complete  PCP or Specialist Appt within 5-7 Days Complete  Home Care Screening Complete  Medication Review (RN CM) Complete

## 2022-09-27 LAB — VITAMIN C: Vitamin C: 0.6 mg/dL (ref 0.4–2.0)

## 2022-09-28 ENCOUNTER — Ambulatory Visit: Payer: Medicaid Other | Admitting: Physician Assistant

## 2022-09-29 LAB — ZINC: Zinc: 55 ug/dL (ref 44–115)

## 2022-09-29 LAB — COPPER, SERUM: Copper: 143 ug/dL — ABNORMAL HIGH (ref 69–132)

## 2022-10-02 ENCOUNTER — Ambulatory Visit: Payer: Medicaid Other | Admitting: Internal Medicine

## 2022-10-03 ENCOUNTER — Encounter: Payer: Self-pay | Admitting: Dietician

## 2022-10-03 NOTE — Progress Notes (Signed)
Nutrition Brief Note   Vitamin labs sent out while pt was hospitalized to ensure levels are adequate to support wound healing. Vitamin labs returned as follows:    Spoke with pt via phone; recommended:  - cholecalciferol 5000 units po daily x 30 days -vitamin A 10,000 units po daily x 30 days  -Vitamin C 500mg  po BID -Zinc 50mg  (elemental) po daily x 30 days   Recommended for patient to follow up with PCP in 30-60 days to have labs rechecked.   Koleen Distance MS, RD, LDN Please refer to [facility] for RD and/or RD on-call/weekend/after hours pager

## 2022-10-03 NOTE — Anesthesia Postprocedure Evaluation (Signed)
Anesthesia Post Note  Patient: [name]  Procedure(s) Performed: DEBRIDMENT OF LEFT DECUBITUS ULCER (Buttocks) APPLICATION OF WOUND VAC (Buttocks)  Patient location during evaluation: PACU Anesthesia Type: MAC Level of consciousness: awake and alert Pain management: pain level controlled Vital Signs Assessment: post-procedure vital signs reviewed and stable Respiratory status: spontaneous breathing, nonlabored ventilation, respiratory function stable and patient connected to nasal cannula oxygen Cardiovascular status: blood pressure returned to baseline and stable Postop Assessment: no apparent nausea or vomiting Anesthetic complications: no   No notable events documented.   Last Vitals:  Vitals:   09/22/22 0749 09/22/22 0833  BP: 126/64   Pulse: (!) 53 60  Resp: 17   Temp: 36.6 C   SpO2: 100%     Last Pain:  Vitals:   09/22/22 0833  TempSrc:   PainSc: 8                  Martha Clan

## 2022-10-13 ENCOUNTER — Other Ambulatory Visit
Admission: RE | Admit: 2022-10-13 | Discharge: 2022-10-13 | Disposition: A | Discharge status: Home / Self Care | Payer: Medicaid Other | Source: Ambulatory Visit | Attending: Physician Assistant | Admitting: Physician Assistant

## 2022-10-13 ENCOUNTER — Encounter: Payer: Medicaid Other | Attending: Internal Medicine | Admitting: Physician Assistant

## 2022-10-13 DIAGNOSIS — L89324 Pressure ulcer of left buttock, stage 4: Secondary | ICD-10-CM | POA: Insufficient documentation

## 2022-10-13 DIAGNOSIS — E440 Moderate protein-calorie malnutrition: Secondary | ICD-10-CM | POA: Insufficient documentation

## 2022-10-13 DIAGNOSIS — B999 Unspecified infectious disease: Secondary | ICD-10-CM | POA: Insufficient documentation

## 2022-10-13 DIAGNOSIS — G8221 Paraplegia, complete: Secondary | ICD-10-CM | POA: Diagnosis not present

## 2022-10-15 LAB — AEROBIC CULTURE W GRAM STAIN (SUPERFICIAL SPECIMEN): Gram Stain: NONE SEEN

## 2022-10-16 NOTE — Progress Notes (Signed)
KEN, BONN ([id number]) [phone] [email or web address] Page 1 of 5 Visit Report for 10/13/2022 Abuse Risk Screen Details Patient Name: Date of Service: [name], [name] 10/13/2022 8:45 A M Medical Record Number: [id number] Patient Account Number: [id number] Date of Birth/Sex: Treating RN: 1991/02/26 (31 y.o. Arthur Holms Primary Care Charde Macfarlane: PA SSMO RE, [place] Other Clinician: Referring Davida Falconi: Treating Ayane Delancey/Extender: [name] Self, Referral Weeks in Treatment: 0 Abuse Risk Screen Items Answer ABUSE RISK SCREEN: Has anyone close to you tried to hurt or harm you recentlyo No Do you feel uncomfortable with anyone in your familyo No Has anyone forced you do things that you didnt want to doo No Electronic Signature(s) Signed: 10/16/2022 1:46:28 PM By: Elliot Gurney, BSN, RN, CWS, Kim RN, BSN Entered By: Elliot Gurney, BSN, RN, CWS, Kim on 10/13/2022 09:16:56 -------------------------------------------------------------------------------- Activities of Daily Living Details Patient Name: Date of Service: [name], [name] NIO [name]. 10/13/2022 8:45 A M Medical Record Number: [id number] Patient Account Number: [id number] Date of Birth/Sex: Treating RN: 03-24-1991 (31 y.o. Arthur Holms Primary Care Anzleigh Slaven: PA SSMO RE, [place] Other Clinician: Referring Taneesha Edgin: Treating Rolf Fells/Extender: [name] Self, Referral Weeks in Treatment: 0 Activities of Daily Living Items Answer Activities of Daily Living (Please select one for each item) Drive Automobile Completely Able T Medications ake Completely Able Use T elephone Completely Able Care for Appearance Completely Able Use T oilet Completely Able Bath / Shower Completely Able Dress Self Completely Able Feed Self Completely Able Walk Completely Able Get In / Out Bed Completely Able Housework Completely CYNCERE, SONTAG ([id number]) [phone] [id number] Page 2 of 5 Prepare Meals  Completely Able Handle Money Completely Able Shop for Self Completely Able Electronic Signature(s) Signed: 10/16/2022 1:46:28 PM By: Elliot Gurney, BSN, RN, CWS, Kim RN, BSN Entered By: Elliot Gurney, BSN, RN, CWS, Kim on 10/13/2022 09:17:17 -------------------------------------------------------------------------------- Education Screening Details Patient Name: Date of Service: [name], [name] NIO [name]. 10/13/2022 8:45 A M Medical Record Number: [id number] Patient Account Number: [id number] Date of Birth/Sex: Treating RN: January 23, 1991 (31 y.o. Arthur Holms Primary Care Miamor Ayler: PA SSMO RE, [place] Other Clinician: Referring Vickki Igou: Treating Bailley Guilford/Extender: [name] Self, Referral Weeks in Treatment: 0 Primary Learner Assessed: Patient Learning Preferences/Education Level/Primary Language Learning Preference: Explanation Highest Education Level: College or Above Preferred Language: English Cognitive Barrier Language Barrier: No Translator Needed: No Memory Deficit: No Emotional Barrier: No Cultural/Religious Beliefs Affecting Medical Care: No Physical Barrier Impaired Vision: No Impaired Hearing: No Decreased Hand dexterity: No Knowledge/Comprehension Knowledge Level: High Comprehension Level: High Ability to understand written instructions: High Ability to understand verbal instructions: High Motivation Anxiety Level: Calm Cooperation: Cooperative Education Importance: Acknowledges Need Interest in Health Problems: Asks Questions Perception: Coherent Willingness to Engage in Self-Management High Activities: Readiness to Engage in Self-Management High Activities: Electronic Signature(s) Signed: 10/16/2022 1:46:28 PM By: Elliot Gurney, BSN, RN, CWS, Kim RN, BSN Entered By: Elliot Gurney, BSN, RN, CWS, Kim on 10/13/2022 09:17:45 [name], [name] ([id number]) [phone] [email or web address] Page 3 of  5 -------------------------------------------------------------------------------- Fall Risk Assessment Details Patient Name: Date of Service: [name], [name] 10/13/2022 8:45 A M Medical Record Number: [id number] Patient Account Number: [id number] Date of Birth/Sex: Treating RN: 11/11/1991 (31 y.o. Arthur Holms Primary Care Noami Bove: PA SSMO RE, [place] Other Clinician: Referring Purity Irmen: Treating Damontae Loppnow/Extender: [name] Self, Referral Weeks in Treatment: 0 Fall Risk Assessment Items Have you had 2 or more falls in the last 12 monthso 0 No Have you had any fall that resulted in injury  in the last 12 monthso 0 No FALLS RISK SCREEN History of falling - immediate or within 3 months 0 No Secondary diagnosis (Do you have 2 or more medical diagnoseso) 0 No Ambulatory aid None/bed rest/wheelchair/nurse 0 Yes Crutches/cane/walker 0 No Furniture 0 No Intravenous therapy Access/Saline/Heparin Lock 0 No Gait/Transferring Normal/ bed rest/ wheelchair 0 Yes Weak (short steps with or without shuffle, stooped but able to lift head while walking, may seek 0 No support from furniture) Impaired (short steps with shuffle, may have difficulty arising from chair, head down, impaired 0 No balance) Mental Status Oriented to own ability 0 Yes Electronic Signature(s) Signed: 10/16/2022 1:46:28 PM By: Elliot Gurney, BSN, RN, CWS, Kim RN, BSN Entered By: Elliot Gurney, BSN, RN, CWS, Kim on 10/13/2022 09:18:04 -------------------------------------------------------------------------------- Foot Assessment Details Patient Name: Date of Service: [name], [name] NIO [name]. 10/13/2022 8:45 A M Medical Record Number: [id number] Patient Account Number: [id number] Date of Birth/Sex: Treating RN: 01-04-1991 (31 y.o. Arthur Holms Primary Care Harshil Cavallaro: PA SSMO RE, [place] Other Clinician: Referring Rodert Hinch: Treating Raelee Rossmann/Extender: [name] Self, Referral Weeks in Treatment: 0 Foot Assessment Items Site  Locations [place] ([id number]) [phone] [id number] Page 4 of 5 + = Sensation present, - = Sensation absent, C = Callus, U = Ulcer R = Redness, W = Warmth, M = Maceration, PU = Pre-ulcerative lesion F = Fissure, S = Swelling, D = Dryness Assessment Right: Left: Other Deformity: No No Prior Foot Ulcer: No No Prior Amputation: No No Charcot Joint: No No Ambulatory Status: Non-ambulatory Assistance Device: Wheelchair GaitCamera operator) Signed: 10/16/2022 1:46:28 PM By: Elliot Gurney, BSN, RN, CWS, Kim RN, BSN Entered By: Elliot Gurney, BSN, RN, CWS, Kim on 10/13/2022 09:18:56 -------------------------------------------------------------------------------- Nutrition Risk Screening Details Patient Name: Date of Service: [name], [name] NIO [name]. 10/13/2022 8:45 A M Medical Record Number: [id number] Patient Account Number: [id number] Date of Birth/Sex: Treating RN: 06-03-1991 (31 y.o. Arthur Holms Primary Care Ade Stmarie: PA SSMO RE, [place] Other Clinician: Referring Lavena Loretto: Treating Breton Berns/Extender: [name] Self, Referral Weeks in Treatment: 0 Height (in): 72 Weight (lbs): 135 Body Mass Index (BMI): 18.3 Nutrition Risk Screening Items Score Screening NUTRITION RISK SCREEN: I have an illness or condition that made me change the kind and/or amount of food I eat 0 No I eat fewer than two meals per day 0 No I eat few fruits and vegetables, or milk products 0 No I have three or more drinks of beer, liquor or wine almost every day 0 No I have tooth or mouth problems that make it hard for me to eat 0 No [name], [name] ([id number]) [id number] Page 5 of 5 I don't always have enough money to buy the food I need 0 No I eat alone most of the time 0 No I take three or more different prescribed or over-the-counter drugs a day 0 No Without wanting to, I have lost or gained 10 pounds in the last six months 0 No I am not  always physically able to shop, cook and/or feed myself 0 No Nutrition Protocols Good Risk Protocol 0 No interventions needed Moderate Risk Protocol High Risk Proctocol Risk Level: Good Risk Score: 0 Electronic Signature(s) Signed: 10/16/2022 1:46:28 PM By: Elliot Gurney, BSN, RN, CWS, Kim RN, BSN Entered By: Elliot Gurney, BSN, RN, CWS, Kim on 10/13/2022 09:18:35

## 2022-10-16 NOTE — Progress Notes (Signed)
[name] ([id number]) [id number] Page 1 of 8 Visit Report for 10/13/2022 Chief Complaint Document Details Patient Name: Date of Service: [name], [name] 10/13/2022 8:45 A M Medical Record Number: [id number] Patient Account Number: [id number] Date of Birth/Sex: Treating RN: December 27, 1990 (31 y.o. [name] Primary Care Provider: PA [name], [name] [name] Other Clinician: Referring Provider: Treating Provider/Extender: [name] Self, Referral Weeks in Treatment: 0 Information Obtained from: Patient Chief Complaint Pressure ulcer left ischium stage 4 Electronic Signature(s) Signed: 10/13/2022 9:46:54 AM By: [name] Kelp PA-C Entered By: [name] on 10/13/2022 09:46:54 -------------------------------------------------------------------------------- HPI Details Patient Name: Date of Service: [name]. 10/13/2022 8:45 A M Medical Record Number: [id number] Patient Account Number: [id number] Date of Birth/Sex: Treating RN: [birth year] (31 y.o. [name] Primary Care Provider: PA [name], [place] [name] Other Clinician: Referring Provider: Treating Provider/Extender: [name] Self, Referral Weeks in Treatment: 0 History of Present Illness HPI Description: 10-13-2022 upon evaluation today patient presents for initial evaluation here in this clinic although this is a individual who may have seen earlier in the year even in the [address] clinic. He has been a long-term patient of mine. With that being said he unfortunately has a wound which is still open and he tells me that this started somewhere to get worse around the August 1 timeframe. Although these are wounds that he has had coming and going for quite some time. Fortunately there does not appear to be any systemic infection at this point though he was recently in the hospital due to this. He was placed on 7 days of Augmentin and Flagyl although I think he may need this little  bit longer he still having some discomfort in the wound itself. With that being said he has been using a wound VAC he has had a lot of drainage so it he tells me that girlfriend I believe is the one who is changing the wound VAC she was taught how to do this when he was in the hospital. With that being said I do think that we are on the right track as far as that is concerned specially with the depth of the wound present fortunately there is no direct bone exposure though this definitely extends to muscle is definitely a stage IV pressure ulcer. He does tell me that he has been sitting about 8 hours a day I did explain that he definitely needs to be in the bed more often and rotating frequently from side to side. I do not believe that he has any signs of sepsis at this point which is good news. Patient does have a history of being a paraplegic he also is moderate protein calorie malnourished and that he is supposed to be increasing protein based on review of his notes from the hospital. Electronic Signature(s) [name] ([id number]) [id number] Page 2 of 8 Signed: 10/13/2022 10:49:49 AM By: [name] Kelp PA-C Entered By: [name] on 10/13/2022 10:49:49 -------------------------------------------------------------------------------- Physical Exam Details Patient Name: Date of Service: [name], [name]. 10/13/2022 8:45 A M Medical Record Number: [id number] Patient Account Number: [id number] Date of Birth/Sex: Treating RN: 09-Dec-1990 (31 y.o. [name] Primary Care Provider: PA [name], [place] [name] Other Clinician: Referring Provider: Treating Provider/Extender: [name] Self, Referral Weeks in Treatment: 0 Constitutional patient is hypertensive.. pulse regular and within target range for patient.Marland Kitchen respirations regular, non-labored and within target range for patient.Marland Kitchen temperature within target range for  patient.. Well-nourished and  well-hydrated in no acute distress. Eyes conjunctiva clear no eyelid edema noted. pupils equal round and reactive to light and accommodation. Ears, Nose, Mouth, and Throat no gross abnormality of ear auricles or external auditory canals. normal hearing noted during conversation. mucus membranes moist. Respiratory normal breathing without difficulty. Musculoskeletal normal gait and posture. no significant deformity or arthritic changes, no loss or range of motion, no clubbing. Psychiatric this patient is able to make decisions and demonstrates good insight into disease process. Alert and Oriented x 3. pleasant and cooperative. Notes Upon inspection patient's wound bed actually showed signs of decent granulation in some areas although there is definitely some drainage that appears to be present. There is no evidence of infection as far as significant redness or erythema around the edges of the wound however he is tender when I do gently touch and even probed the wound bed. This makes me a little concerned about the possibility of infection especially considering the history of having increased drainage. He was not on the Augmentin and Flagyl but 7 days this probably was not sufficient to completely clear what we are looking at here. 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 Electronic Signature(s) Signed: 10/13/2022 10:50:43 AM By: [name] Kelp PA-C Entered By: [name] on 10/13/2022 10:50:43 -------------------------------------------------------------------------------- Physician Orders Details Patient Name: Date of Service: [name], [name] 10/13/2022 8:45 A M Medical Record Number: [id number] Patient Account Number: [id number] Date of Birth/Sex: Treating RN: 11-05-1991 (31 y.o. [name] Primary Care Provider: PA [name]  [name], [name] [name] Other Clinician: [name], [name] ([id number]) [id number] Page 3 of 8 Referring Provider: Treating Provider/Extender: [name] Self, Referral Weeks in Treatment: 0 Verbal / Phone Orders: No Diagnosis Coding ICD-10 Coding Code Description L89.324 Pressure ulcer of left buttock, stage 4 G82.21 Paraplegia, complete Bathing/ Shower/ Hygiene Clean wound with Normal Saline or wound cleanser. Negative Pressure Wound Therapy Wound VAC settings at continuous pressure. Use foam to wound cavity. Please order WHITE foam to fill any tunnel/s and/or undermining when necessary. Change VAC dressing 2 X WEEK. Change canister as indicated when full. - black foam in base of wound, bridge over to hip. At home, Patient's [facility] had been trained to change to wound vac. Wound Treatment Wound #1 - Ischium Wound Laterality: Left Prim Dressing: Gauze ary Discharge Instructions: As directed: moistened with Dakins Solution Secondary Dressing: (BORDER) Zetuvit Plus SILICONE BORDER Dressing 5x5 (in/in) Discharge Instructions: Please do not put silicone bordered dressings under wraps. Use non-bordered dressing only. Laboratory Bacteria identified in Wound by Culture (MICRO) - Left Ischium LOINC Code: [phone] Convenience Name: Wound culture routine Patient Medications llergies: ciprofloxacin A Notifications Medication Indication Start End 10/13/2022 amoxicillin-pot clavulanate DOSE 1 - oral 875 mg-125 mg tablet - 1 tablet oral taken 2 times per day for 14 days. 10/13/2022 metronidazole DOSE 1 - oral 500 mg tablet - 1 tablet oral taken 3 times per day for 14 days Electronic Signature(s) Signed: 10/13/2022 11:04:06 AM By: Elliot Gurney, BSN, RN, CWS, Kim RN, BSN Signed: 10/13/2022 2:32:55 PM By: [name] Kelp PA-C Previous Signature: 10/13/2022 10:54:22 AM Version By: [name] Kelp PA-C Entered By: Elliot Gurney, BSN, RN, CWS, Kim on 10/13/2022  11:04:06 -------------------------------------------------------------------------------- Problem List Details Patient Name: Date of Service: [name], [name] NTO NIO [name]. 10/13/2022 8:45 A M Medical  Record Number: [id number] Patient Account Number: [id number] Date of Birth/Sex: Treating RN: September 28, 1991 (31 y.o. [name] Primary Care Provider: PA [name], [place] [name] Other Clinician: Referring Provider: Treating Provider/Extender: [name] Self, Referral Weeks in Treatment: 0 [name], [name] ([id number]) [id number] Page 4 of 8 Active Problems ICD-10 Encounter Code Description Active Date MDM Diagnosis L89.324 Pressure ulcer of left buttock, stage 4 10/13/2022 No Yes G82.21 Paraplegia, complete 10/13/2022 No Yes E44.0 Moderate protein-calorie malnutrition 10/13/2022 No Yes Inactive Problems Resolved Problems Electronic Signature(s) Signed: 10/13/2022 9:56:40 AM By: [name] Kelp PA-C Previous Signature: 10/13/2022 9:46:22 AM Version By: [name] Kelp PA-C Entered By: [name] on 10/13/2022 09:56:39 -------------------------------------------------------------------------------- Progress Note Details Patient Name: Date of Service: [name]. 10/13/2022 8:45 A M Medical Record Number: [id number] Patient Account Number: [id number] Date of Birth/Sex: Treating RN: October 04, 1991 (31 y.o. [name] Primary Care Provider: PA [name], [place] [name] Other Clinician: Referring Provider: Treating Provider/Extender: [name] Self, Referral Weeks in Treatment: 0 Subjective Chief Complaint Information obtained from Patient Pressure ulcer left ischium stage 4 History of Present Illness (HPI) 10-13-2022 upon evaluation today patient presents for initial evaluation here in this clinic although this is a individual who may have seen earlier in the year even in the [place] clinic. He has been a long-term patient of mine. With that being said he  unfortunately has a wound which is still open and he tells me that this started somewhere to get worse around the August 1 timeframe. Although these are wounds that he has had coming and going for quite some time. Fortunately there does not appear to be any systemic infection at this point though he was recently in the hospital due to this. He was placed on 7 days of Augmentin and Flagyl although I think he may need this little bit longer he still having some discomfort in the wound itself. With that being said he has been using a wound VAC he has had a lot of drainage so it he tells me that girlfriend I believe is the one who is changing the wound VAC she was taught how to do this when he was in the hospital. With that being said I do think that we are on the right track as far as that is concerned specially with the depth of the wound present fortunately there is no direct bone exposure though this definitely extends to muscle is definitely a stage IV pressure ulcer. He does tell me that he has been sitting about 8 hours a day I did explain that he definitely needs to be in the bed more often and rotating frequently from side to side. I do not believe that he has any signs of sepsis at this point which is good news. Patient does have a history of being a paraplegic he also is moderate protein calorie malnourished and that he is supposed to be increasing protein based on review of his notes from the hospital. Patient History Allergies ciprofloxacin (Reaction: itching) Social History [name], [name] ([id number]) [id number] Page 5 of 8 Current every day smoker - 3 day, Marital Status - Single, Alcohol Use - Never, Drug Use - Current History - Marijuana, Caffeine Use - Never. Medical History Endocrine Denies history of Type I Diabetes, Type II Diabetes Neurologic Patient has history of Paraplegia Review of Systems (ROS) Constitutional Symptoms (General  Health) Denies complaints or symptoms of Fatigue, Fever, Chills. Eyes Denies complaints or symptoms of Dry Eyes,  Vision Changes, Glasses / Contacts. Ear/Nose/Mouth/Throat Denies complaints or symptoms of Difficult clearing ears, Sinusitis. Hematologic/Lymphatic Denies complaints or symptoms of Bleeding / Clotting Disorders, Human Immunodeficiency Virus. Respiratory Denies complaints or symptoms of Chronic or frequent coughs, Shortness of Breath. Cardiovascular Denies complaints or symptoms of Chest pain, LE edema. Gastrointestinal Denies complaints or symptoms of Frequent diarrhea, Nausea, Vomiting. Genitourinary Denies complaints or symptoms of Kidney failure/ Dialysis, Incontinence/dribbling. Immunological Denies complaints or symptoms of Hives, Itching. Integumentary (Skin) Complains or has symptoms of Wounds, Bleeding or bruising tendency. Musculoskeletal Denies complaints or symptoms of Muscle Pain, Muscle Weakness. Psychiatric Denies complaints or symptoms of Anxiety, Claustrophobia. Objective Constitutional patient is hypertensive.. pulse regular and within target range for patient.. respirations regular, non-labored and within target range for patient.. temperature within target range for patient.. Well-nourished and well-hydrated in no acute distress. Vitals Time Taken: 9:11 AM, Height: 72 in, Weight: 135 lbs, BMI: 18.3, Temperature: 98.4 F, Pulse: 87 bpm, Respiratory Rate: 16 breaths/min, Blood Pressure: 148/90 mmHg. Eyes conjunctiva clear no eyelid edema noted. pupils equal round and reactive to light and accommodation. Ears, Nose, Mouth, and Throat no gross abnormality of ear auricles or external auditory canals. normal hearing noted during conversation. mucus membranes moist. Respiratory normal breathing without difficulty. Musculoskeletal normal gait and posture. no significant deformity or arthritic changes, no loss or range of motion, no  clubbing. Psychiatric this patient is able to make decisions and demonstrates good insight into disease process. Alert and Oriented x 3. pleasant and cooperative. General Notes: Upon inspection patient's wound bed actually showed signs of decent granulation in some areas although there is definitely some drainage that appears to be present. There is no evidence of infection as far as significant redness or erythema around the edges of the wound however he is tender when I do gently touch and even probed the wound bed. This makes me a little concerned about the possibility of infection especially considering the history of having increased drainage. He was not on the Augmentin and Flagyl but 7 days this probably was not sufficient to completely clear what we are looking at here. 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 Integumentary (Hair, Skin) Wound #1 status is Open. Original cause of wound was Pressure Injury. The date acquired was: 06/27/2022. The wound is located on the Left Ischium. The wound measures 3.5cm length x 6cm width x 2.5cm depth; 16.493cm^2 area and 41.233cm^3 volume. There is muscle and Fat Layer (Subcutaneous Tissue) exposed. There is no tunneling noted, however, there is undermining starting at 8:00 and ending at 3:00 with a maximum distance of 4.5cm. There is a large amount of serous drainage noted. There is large (67-100%) red, hyper - granulation within the wound bed. There is a small (1-33%) amount of necrotic tissue within the wound bed including Adherent Slough. Assessment Active Problems ICD-10 [name], [name] ([id number]) [id number] Page 6 of 8 Pressure ulcer of left buttock, stage 4 Paraplegia, complete Moderate protein-calorie malnutrition Plan Bathing/ Shower/ Hygiene: Clean wound  with Normal Saline or wound cleanser. Negative Pressure Wound Therapy: Wound VAC settings at <MEASUR T>Irven Coe. Use foam to wound cavity. Please order WHITE foam to fill any tunnel/s and/or undermining when necessary. Change VAC dressing 2 X WEEK. Change canister as indicated when full. - black foam in base of wound, bridge over to hip. At home,  Patient's [employer] had been trained to change to wound vac. The following medication(s) was prescribed: amoxicillin-pot clavulanate oral 875 mg-125 mg tablet 1 1 tablet oral taken 2 times per day for 14 days. starting 10/13/2022 metronidazole oral 500 mg tablet 1 1 tablet oral taken 3 times per day for 14 days starting 10/13/2022 WOUND #1: - Ischium Wound Laterality: Left Prim Dressing: Gauze ary Discharge Instructions: As directed: moistened with Dakins Solution Secondary Dressing: (BORDER) Zetuvit Plus SILICONE BORDER Dressing 5x5 (in/in) Discharge Instructions: Please do not put silicone bordered dressings under wraps. Use non-bordered dressing only. 1. Based on what I am seeing currently I do believe that the patient would benefit from a wound culture to see what we are dealing with at this point. I did do this from the deepest part of the wound currently so we will just get surface stuff. 2. I did also go ahead and send in prescriptions today for Augmentin and Flagyl which I think is going to be good to have him do for at least the next 2 weeks to ensure that everything completely cleared and that there is no active infection. 3. I am also can recommend based on overseeing currently that we have the patient go ahead and initiate a continuation of treatment with the wound VAC which I think is appropriate. We did actually just use Dakin's moistened gauze packing today as he did not bring the wound VAC with him. 4. We did have a conversation extensively today about offloading and I think that that is going to be something that he definitely needs  to continue to focus on. It is very important for him to keep pressure off of this area and allow this to heal appropriately. He voiced understanding. This means that he needs to be changing positions when he is in the bed at least every 1-2 hours and subsequently also needs to really try not to sit for 8 hours a day like he has been doing currently. That can be bad for this wound and its appropriate healing. He voiced understanding. These are all things that we have discussed before but I did want to reiterate them today as well. We will see patient back for reevaluation in 2 weeks here in the clinic. If anything worsens or changes patient will contact our office for additional recommendations. Electronic Signature(s) Signed: 10/13/2022 10:56:01 AM By: [name] Kelp PA-C Entered By: [name] on 10/13/2022 10:56:00 -------------------------------------------------------------------------------- ROS/PFSH Details Patient Name: Date of Service: [name], [name]. 10/13/2022 8:45 A M Medical Record Number: [id number] Patient Account Number: [id number] Date of Birth/Sex: Treating RN: [birth year] (31 y.o. [name] Primary Care Provider: PA [name], [place] [name] Other Clinician: Referring Provider: Treating Provider/Extender: [name] Self, Referral Weeks in Treatment: 0 Constitutional Symptoms (General Health) Complaints and Symptoms: Negative for: Fatigue; Fever; Chills Eyes Complaints and Symptoms: Negative for: Dry Eyes; Vision Changes; Glasses / Contacts [name], [name] ([id number]) [id number] Page 7 of 8 Ear/Nose/Mouth/Throat Complaints and Symptoms: Negative for: Difficult clearing ears; Sinusitis Hematologic/Lymphatic Complaints and Symptoms: Negative for: Bleeding / Clotting Disorders; Human Immunodeficiency Virus Respiratory Complaints and Symptoms: Negative for: Chronic or frequent coughs; Shortness of  Breath Cardiovascular Complaints and Symptoms: Negative for: Chest pain; LE edema Gastrointestinal Complaints and Symptoms: Negative for: Frequent diarrhea; Nausea; Vomiting Genitourinary Complaints and Symptoms: Negative for: Kidney failure/ Dialysis; Incontinence/dribbling Immunological Complaints and Symptoms: Negative for: Hives; Itching Integumentary (Skin) Complaints and Symptoms: Positive for: Wounds; Bleeding or bruising tendency Musculoskeletal Complaints  and Symptoms: Negative for: Muscle Pain; Muscle Weakness Psychiatric Complaints and Symptoms: Negative for: Anxiety; Claustrophobia Endocrine Medical History: Negative for: Type I Diabetes; Type II Diabetes Neurologic Medical History: Positive for: Paraplegia Oncologic Immunizations Pneumococcal Vaccine: Received Pneumococcal Vaccination: No Implantable Devices None Family and Social History Current every day smoker - 3 day; Marital Status - Single; Alcohol Use: Never; Drug Use: Current History - Marijuana; Caffeine Use: Never Electronic Signature(s) Signed: 10/13/2022 2:32:55 PM By: [name] Kelp PA-C Signed: 10/16/2022 1:46:28 PM By: Elliot Gurney BSN, RN, CWS, Kim RN, BSN [id number] [place] [phone] By: Elliot Gurney, BSN, RN, CWS, Kim RN, BSN [phone].pdf Page 8 of 8 Signed: 10/16/2022 1:46:28 Entered By: Elliot Gurney, BSN, RN, CWS, Kim on 10/13/2022 09:16:48 -------------------------------------------------------------------------------- SuperBill Details Patient Name: Date of Service: [name] 10/13/2022 Medical Record Number: [id number] Patient Account Number: [id number] Date of Birth/Sex: Treating RN: 1991-04-21 (31 y.o. [name] Primary Care Provider: PA [name], [place] [name] Other Clinician: Referring Provider: Treating Provider/Extender: [name] Self, Referral Weeks in Treatment: 0 Diagnosis Coding ICD-10 Codes Code Description [phone] Pressure ulcer of left  buttock, stage 4 G82.21 Paraplegia, complete E44.0 Moderate protein-calorie malnutrition Facility Procedures : CPT4 Code: [id number] Description: 99214 - WOUND CARE VISIT-LEV 4 EST PT Modifier: Quantity: 1 Physician Procedures : CPT4 Code Description Modifier [id number] 99214 - WC PHYS LEVEL 4 - EST PT ICD-10 Diagnosis Description L89.324 Pressure ulcer of left buttock, stage 4 G82.21 Paraplegia, complete E44.0 Moderate protein-calorie malnutrition Quantity: 1 Electronic Signature(s) Signed: 10/13/2022 10:56:19 AM By: [name] Kelp PA-C Entered By: [name] on 10/13/2022 10:56:19

## 2022-10-16 NOTE — Progress Notes (Signed)
TAMARA, KENYON ([id number]) [id number] Page 1 of 9 Visit Report for 10/13/2022 Allergy List Details Patient Name: Date of Service: [name], [name] 10/13/2022 8:45 [name] M Medical Record Number: [id number] Patient Account Number: [id number] Date of Birth/Sex: Treating RN: 07-09-1991 (31 y.o. Arthur Holms Primary Care Derica Leiber: PA SSMO RE, [place] Other Clinician: Referring Jerimyah Vandunk: Treating [place] Pelto/Extender: [name] Self, Referral Weeks in Treatment: 0 Allergies Active Allergies ciprofloxacin Reaction: itching Allergy Notes Electronic Signature(s) Signed: 10/13/2022 10:27:02 AM By: Elliot Gurney, BSN, RN, CWS, Kim RN, BSN Entered By: Elliot Gurney, BSN, RN, CWS, Kim on 10/13/2022 10:27:02 -------------------------------------------------------------------------------- Arrival Information Details Patient Name: Date of Service: [name], [name] 10/13/2022 8:45 [name] M Medical Record Number: [id number] Patient Account Number: [id number] Date of Birth/Sex: Treating RN: Mar 02, 1991 (31 y.o. Arthur Holms Primary Care Blaize Nipper: PA SSMO RE, [place] Other Clinician: Referring Charley Miske: Treating Zhaniya Swallows/Extender: [name] Self, Referral Weeks in Treatment: 0 Visit Information Patient Arrived: Wheel Chair Arrival Time: 09:09 Accompanied By: self Transfer Assistance: Manual Patient Identification Verified: Yes Secondary Verification Process Completed: Yes Patient Has Alerts: Yes Patient Alerts: NOT diabetic Electronic Signature(s) Signed: 10/16/2022 1:46:28 PM By: Elliot Gurney, BSN, RN, CWS, Kim RN, BSN Entered By: Elliot Gurney, BSN, RN, CWS, Kim on 10/13/2022 09:10:41 [name] ([id number]) [id number] Page 2 of 9 -------------------------------------------------------------------------------- Clinic Level of Care Assessment Details Patient Name: Date of Service: [name], [name] 10/13/2022 8:45 [name] M Medical Record Number:  [id number] Patient Account Number: [id number] Date of Birth/Sex: Treating RN: 1991-10-24 (31 y.o. Arthur Holms Primary Care Iran Rowe: PA SSMO RE, [place] Other Clinician: Referring Killian Ress: Treating Livianna Petraglia/Extender: [name] Self, Referral Weeks in Treatment: 0 Clinic Level of Care Assessment Items TOOL 2 Quantity Score []  - 0 Use when only an EandM is performed on the INITIAL visit ASSESSMENTS - Nursing Assessment / Reassessment X- 1 20 General Physical Exam (combine w/ comprehensive assessment (listed just below) when performed on new pt. evals) X- 1 25 Comprehensive Assessment (HX, ROS, Risk Assessments, Wounds Hx, etc.) ASSESSMENTS - Wound and Skin [name] ssessment / Reassessment X - Simple Wound Assessment / Reassessment - one wound 1 5 []  - 0 Complex Wound Assessment / Reassessment - multiple wounds []  - 0 Dermatologic / Skin Assessment (not related to wound area) ASSESSMENTS - Ostomy and/or Continence Assessment and Care []  - 0 Incontinence Assessment and Management []  - 0 Ostomy Care Assessment and Management (repouching, etc.) PROCESS - Coordination of Care X - Simple Patient / Family Education for ongoing care 1 15 []  - 0 Complex (extensive) Patient / Family Education for ongoing care X- 1 10 Staff obtains , Records, T Results / Process Orders est []  - 0 Staff telephones HHA, Nursing Homes / Clarify orders / etc []  - 0 Routine Transfer to another Facility (non-emergent condition) []  - 0 Routine Hospital Admission (non-emergent condition) X- 1 15 New Admissions / / Ordering NPWT Apligraf, etc. , []  - 0 Emergency Hospital Admission (emergent condition) X- 1 10 Simple Discharge Coordination []  - 0 Complex (extensive) Discharge Coordination PROCESS - Special Needs []  - 0 Pediatric / Minor Patient Management []  - 0 Isolation Patient Management []  - 0 Hearing / Language / Visual special needs []  - 0 Assessment of Community  assistance (transportation, D/C planning, etc.) []  - 0 Additional assistance / Altered mentation []  - 0 Support Surface(s) Assessment (bed, cushion, seat, etc.) INTERVENTIONS - Wound Cleansing / Measurement Hawe, Geovonni L ( ) [id number] Page 3 of  9 X- 1 5 Wound Imaging (photographs - any number of wounds) []  - 0 Wound Tracing (instead of photographs) X- 1 5 Simple Wound Measurement - one wound []  - 0 Complex Wound Measurement - multiple wounds X- 1 5 Simple Wound Cleansing - one wound []  - 0 Complex Wound Cleansing - multiple wounds INTERVENTIONS - Wound Dressings []  - 0 Small Wound Dressing one or multiple wounds X- 1 15 Medium Wound Dressing one or multiple wounds []  - 0 Large Wound Dressing one or multiple wounds []  - 0 Application of Medications - injection INTERVENTIONS - Miscellaneous []  - 0 External ear exam []  - 0 Specimen Collection (cultures, biopsies, blood, body fluids, etc.) []  - 0 Specimen(s) / Culture(s) sent or taken to Lab for analysis []  - 0 Patient Transfer (multiple staff / Nurse, adultHoyer Lift / Similar devices) []  - 0 Simple Staple / Suture removal (25 or less) []  - 0 Complex Staple / Suture removal (26 or more) []  - 0 Hypo / Hyperglycemic Management (close monitor of Blood Glucose) []  - 0 Ankle / Brachial Index (ABI) - do not check if billed separately Has the patient been seen at the hospital within the last three years: Yes Total Score: 130 Level Of Care: New/Established - Level 4 Electronic Signature(s) Signed: 10/16/2022 1:46:28 PM By: Elliot GurneyWoody, BSN, RN, CWS, Kim RN, BSN Entered By: Elliot GurneyWoody, BSN, RN, CWS, Kim on 10/13/2022 10:31:26 -------------------------------------------------------------------------------- Encounter Discharge Information Details Patient Name: Date of Service: [name], [name] 10/13/2022 8:45 [name] M Medical Record Number: [id number] Patient Account Number: [id number] Date of Birth/Sex:  Treating RN: 04/27/1991 (31 y.o. Arthur HolmsM) Woody, Kim Primary Care Chrisotpher Rivero: [place] Other Clinician: Referring Karan Ramnauth: Treating Dortha Neighbors/Extender: Allen DerryStone, Hoyt Self, Referral Weeks in Treatment: 0 Encounter Discharge Information Items Discharge Condition: Stable Ambulatory Status: Wheelchair Discharge Destination: Home Transportation: Private Auto Schedule Follow-up Appointment: Yes Clinical Summary of Care: Electronic Signature(sZandra Abts) Nagengast, Aldine L ([id number]) [id number] Page 4 of 9 Signed: 10/13/2022 10:34:03 AM By: Elliot GurneyWoody, BSN, RN, CWS, Kim RN, BSN Entered By: Elliot GurneyWoody, BSN, RN, CWS, Kim on 10/13/2022 10:34:03 -------------------------------------------------------------------------------- Lower Extremity Assessment Details Patient Name: Date of Service: [name], [name] 10/13/2022 8:45 [name] M Medical Record Number: [id number] Patient Account Number: [id number] Date of Birth/Sex: Treating RN: 04/10/1991 (31 y.o. Arthur HolmsM) Woody, Kim Primary Care Monaca Wadas: [place], [place] Other Clinician: Referring Jaquayla Hege: Treating Valery Chance/Extender: Allen DerryStone, [place] Self, Referral Weeks in Treatment: 0 Electronic Signature(s) Signed: 10/16/2022 1:46:28 PM By: Elliot GurneyWoody, BSN, RN, CWS, Kim RN, BSN Entered By: Elliot GurneyWoody, BSN, RN, CWS, Kim on 10/13/2022 09:30:18 -------------------------------------------------------------------------------- Multi Wound Chart Details Patient Name: Date of Service: [name], [name] 10/13/2022 8:45 [name] M Medical Record Number: [id number] Patient Account Number: [id number] Date of Birth/Sex: Treating RN: 10/07/1991 (31 y.o. Arthur HolmsM) Woody, Kim Primary Care Kynley Metzger: [place], [place] Other Clinician: Referring Shaynah Hund: Treating Chatara Lucente/Extender: Allen DerryStone, Hoyt Self, Referral Weeks in Treatment: 0 Vital Signs Height(in): 72 Pulse(bpm): 87 Weight(lbs): 135 Blood Pressure(mmHg): 148/90 Body Mass Index(BMI): 18.3 Temperature(F): 98.4 Respiratory  Rate(breaths/min): 16 [1:Photos: No Photos Left Ischium Wound Location: Pressure Injury Wounding Event: Pressure Ulcer Primary Etiology: Paraplegia Comorbid History: 06/27/2022 Date Acquired: 0 Weeks of Treatment: Open Wound Status: No Wound Recurrence: 3.5x6x2.5 Measurements L x W x D  (cm) 16.493 [name] (cm) : rea 41.233 Volume (cm) : 8 Starting Position 1 (o'clock): 3 Ending Position 1 (o'clock):] [N/[name]:N/[name] N/[name] N/[name] N/[name] N/[name] N/[name] N/[name] N/[name] N/[name] N/[name] N/[name] N/[name]] [name], Mehki L ([id number]) [1:4.5  Maximum Distance 1 (cm): Yes Undermining: Category/Stage IV Classification: Large Exudate [name] mount: Serous Exudate Type: amber Exudate Color: Large (67-100%) Granulation [name] mount: Red, Hyper-granulation Granulation Quality: Small (1-33%) Necrotic [name]  mount: Fat Layer (Subcutaneous Tissue): Yes N/[name] Exposed Structures: Muscle: Yes Fascia: No Tendon: No Joint: No Bone: No Small (1-33%) Epithelialization:] [N/[name]:N/[name] N/[name] N/[name] N/[name] N/[name] N/[name] N/[name] N/[name] N/[name]] Treatment Notes Electronic Signature(s) Signed: 10/16/2022 1:46:28 PM By: Elliot Gurney, BSN, RN, CWS, Kim RN, BSN Entered By: Elliot Gurney, BSN, RN, CWS, Kim on 10/13/2022 09:50:56 -------------------------------------------------------------------------------- Multi-Disciplinary Care Plan Details Patient Name: Date of Service: [name], [name] NIO L. 10/13/2022 8:45 [name] M Medical Record Number: [id number] Patient Account Number: [id number] Date of Birth/Sex: Treating RN: December 07, 1990 (31 y.o. Arthur Holms Primary Care Chantae Soo: PA SSMO RE, [place] Other Clinician: Referring Pasqualino Witherspoon: Treating Vivyan Biggers/Extender: [name] Self, Referral Weeks in Treatment: 0 Active Inactive Necrotic Tissue Nursing Diagnoses: Impaired tissue integrity related to necrotic/devitalized tissue Knowledge deficit related to management of necrotic/devitalized tissue Goals: Necrotic/devitalized tissue will be minimized in the wound bed Date Initiated: 10/13/2022 Target Resolution Date: 10/13/2022 Goal Status:  Active Patient/caregiver will verbalize understanding of reason and process for debridement of necrotic tissue Date Initiated: 10/13/2022 Target Resolution Date: 10/13/2022 Goal Status: Active Interventions: Assess patient pain level pre-, during and post procedure and prior to discharge Provide education on necrotic tissue and debridement process Treatment Activities: Excisional debridement : 10/13/2022 Notes: Orientation to the Wound Care Program Nursing Diagnoses: Knowledge deficit related to the wound healing center program REESE, STOCKMAN ([id number]) [id number] Page 6 of 9 Goals: Patient/caregiver will verbalize understanding of the Wound Healing Center Program Date Initiated: 10/13/2022 Target Resolution Date: 10/13/2022 Goal Status: Active Interventions: Provide education on orientation to the wound center Notes: Soft Tissue Infection Nursing Diagnoses: Impaired tissue integrity Knowledge deficit related to disease process and management Knowledge deficit related to home infection control: handwashing, handling of soiled dressings, supply storage Potential for infection: soft tissue Goals: Patient will remain free of wound infection Date Initiated: 10/13/2022 Target Resolution Date: 10/13/2022 Goal Status: Active Interventions: Assess signs and symptoms of infection every visit Notes: Wound/Skin Impairment Nursing Diagnoses: Impaired tissue integrity Knowledge deficit related to ulceration/compromised skin integrity Goals: Patient/caregiver will verbalize understanding of skin care regimen Date Initiated: 10/13/2022 Target Resolution Date: 10/13/2022 Goal Status: Active Ulcer/skin breakdown will have [name] volume reduction of 30% by week 4 Date Initiated: 10/13/2022 Target Resolution Date: 11/03/2022 Goal Status: Active Interventions: Assess ulceration(s) every visit Provide education on ulcer and skin care Treatment  Activities: Skin care regimen initiated : 10/13/2022 Notes: Electronic Signature(s) Signed: 10/16/2022 1:46:28 PM By: Elliot Gurney, BSN, RN, CWS, Kim RN, BSN Entered By: Elliot Gurney, BSN, RN, CWS, Kim on 10/13/2022 09:50:29 -------------------------------------------------------------------------------- Pain Assessment Details Patient Name: Date of Service: [name], [name] NIO L. 10/13/2022 8:45 [name] M Medical Record Number: [id number] Patient Account Number: [id number] Date of Birth/Sex: Treating RN: 1991-06-27 (31 y.o. Arthur Holms Primary Care Adrianna Dudas: PA SSMO RE, [place] Other Clinician: Referring Rease Swinson: Treating Kawehi Hostetter/Extender: [name] Self, Referral TAMMY, ERICSSON ([id number]) [id number] Page 7 of 9 Weeks in Treatment: 0 Active Problems Location of Pain Severity and Description of Pain Patient Has Paino Yes Site Locations Pain Location: Pain in Ulcers Duration of the Pain. Constant / Intermittento Constant Rate the pain. Current Pain Level: 10 Character of Pain Describe the Pain: Sharp, Tender, Throbbing Pain Management and Medication Current Pain Management: Electronic Signature(s) Signed: 10/16/2022 1:46:28 PM By: Elliot Gurney, BSN, RN, CWS, Kim RN, BSN Entered  By: Elliot Gurney, BSN, RN, CWS, Kim on 10/13/2022 09:11:16 -------------------------------------------------------------------------------- Patient/Caregiver Education Details Patient Name: Date of Service: JATHEN, SUDANO 11/17/2023andnbsp8:45 [name] M Medical Record Number: [id number] Patient Account Number: [id number] Date of Birth/Gender: Treating RN: 1991-07-19 (31 y.o. Arthur Holms Primary Care Physician: PA SSMO RE, [place] Other Clinician: Referring Physician: Treating Physician/Extender: [name] Self, Referral Weeks in Treatment: 0 Education Assessment Education Provided To: Patient Education Topics Provided Offloading: Handouts: What is Offloadingo, Other: keep pressure off of  wounds Methods: Demonstration, Explain/Verbal Responses: State content correctly Welcome T [facility]: o Handouts: Welcome T [facility] o Methods: Demonstration, Explain/Verbal [name], [name] ([id number]) [id number] Page 8 of 9 Responses: State content correctly Wound/Skin Impairment: Handouts: Caring for Your Ulcer, Smoking and Wound Healing, Other: wound care as prescribed, NPWT applied at home Methods: Demonstration, Explain/Verbal Responses: State content correctly Electronic Signature(s) Signed: 10/16/2022 1:46:28 PM By: Elliot Gurney, BSN, RN, CWS, Kim RN, BSN Entered By: Elliot Gurney, BSN, RN, CWS, Kim on 10/13/2022 10:33:21 -------------------------------------------------------------------------------- Wound Assessment Details Patient Name: Date of Service: [name], [name] NIO L. 10/13/2022 8:45 [name] M Medical Record Number: [id number] Patient Account Number: [id number] Date of Birth/Sex: Treating RN: 05/08/1991 (31 y.o. Arthur Holms Primary Care Tondra Reierson: PA SSMO RE, [place] Other Clinician: Referring Lanika Colgate: Treating Blayde Bacigalupi/Extender: [name] Self, Referral Weeks in Treatment: 0 Wound Status Wound Number: 1 Primary Etiology: Pressure Ulcer Wound Location: Left Ischium Wound Status: Open Wounding Event: Pressure Injury Comorbid History: Paraplegia Date Acquired: 06/27/2022 Weeks Of Treatment: 0 Clustered Wound: No Wound Measurements Length: (cm) 3.5 Width: (cm) 6 Depth: (cm) 2.5 Area: (cm) 16.493 Volume: (cm) 41.233 % Reduction in Area: % Reduction in Volume: Epithelialization: Small (1-33%) Tunneling: No Undermining: Yes Starting Position (o'clock): 8 Ending Position (o'clock): 3 Maximum Distance: (cm) 4.5 Wound Description Classification: Category/Stage IV Exudate Amount: Large Exudate Type: Serous Exudate Color: amber Foul Odor After Cleansing: No Slough/Fibrino Yes Wound Bed Granulation Amount: Large  (67-100%) Exposed Structure Granulation Quality: Red, Hyper-granulation Fascia Exposed: No Necrotic Amount: Small (1-33%) Fat Layer (Subcutaneous Tissue) Exposed: Yes Necrotic Quality: Adherent Slough Tendon Exposed: No Muscle Exposed: Yes Necrosis of Muscle: No Joint Exposed: No Bone Exposed: No Treatment Notes Wound #1 (Ischium) Wound Laterality: Left Akens, Datron L ([id number]) [id number] Page 9 of 9 Cleanser Peri-Wound Care Topical Primary Dressing Gauze Discharge Instruction: As directed: moistened with Dakins Solution Secondary Dressing (BORDER) Zetuvit Plus SILICONE BORDER Dressing 5x5 (in/in) Discharge Instruction: Please do not put silicone bordered dressings under wraps. Use non-bordered dressing only. Secured With Compression Wrap Compression Stockings Facilities manager) Signed: 10/16/2022 1:46:28 PM By: Elliot Gurney, BSN, RN, CWS, Kim RN, BSN Entered By: Elliot Gurney, BSN, RN, CWS, Kim on 10/13/2022 09:30:07 -------------------------------------------------------------------------------- Vitals Details Patient Name: Date of Service: RAKIM, MOONE 10/13/2022 8:45 [name] M Medical Record Number: [id number] Patient Account Number: [id number] Date of Birth/Sex: Treating RN: 1991/01/29 (31 y.o. Arthur Holms Primary Care Maesyn Frisinger: PA SSMO RE, [place] Other Clinician: Referring Sevon Rotert: Treating Jalexis Breed/Extender: [name] Self, Referral Weeks in Treatment: 0 Vital Signs Time Taken: 09:11 Temperature (F): 98.4 Height (in): 72 Pulse (bpm): 87 Weight (lbs): 135 Respiratory Rate (breaths/min): 16 Body Mass Index (BMI): 18.3 Blood Pressure (mmHg): 148/90 Reference Range: 80 - 120 mg / dl Electronic Signature(s) Signed: 10/16/2022 1:46:28 PM By: Elliot Gurney, BSN, RN, CWS, Kim RN, BSN Entered By: Elliot Gurney, BSN, RN, CWS, Kim on 10/13/2022 09:12:54

## 2022-10-19 ENCOUNTER — Inpatient Hospital Stay
Admission: EM | Admit: 2022-10-19 | Discharge: 2022-10-27 | DRG: 593 | Disposition: A | Discharge status: Home / Self Care | Payer: Medicaid Other | Attending: Internal Medicine | Admitting: Internal Medicine

## 2022-10-19 ENCOUNTER — Other Ambulatory Visit: Payer: Self-pay

## 2022-10-19 DIAGNOSIS — N319 Neuromuscular dysfunction of bladder, unspecified: Secondary | ICD-10-CM | POA: Diagnosis present

## 2022-10-19 DIAGNOSIS — E440 Moderate protein-calorie malnutrition: Secondary | ICD-10-CM | POA: Diagnosis present

## 2022-10-19 DIAGNOSIS — D75839 Thrombocytosis, unspecified: Secondary | ICD-10-CM | POA: Diagnosis present

## 2022-10-19 DIAGNOSIS — N529 Male erectile dysfunction, unspecified: Secondary | ICD-10-CM | POA: Diagnosis present

## 2022-10-19 DIAGNOSIS — W3400XS Accidental discharge from unspecified firearms or gun, sequela: Secondary | ICD-10-CM | POA: Diagnosis not present

## 2022-10-19 DIAGNOSIS — Z91199 Patient's noncompliance with other medical treatment and regimen due to unspecified reason: Secondary | ICD-10-CM | POA: Diagnosis not present

## 2022-10-19 DIAGNOSIS — L89304 Pressure ulcer of unspecified buttock, stage 4: Secondary | ICD-10-CM | POA: Insufficient documentation

## 2022-10-19 DIAGNOSIS — Z681 Body mass index (BMI) 19 or less, adult: Secondary | ICD-10-CM

## 2022-10-19 DIAGNOSIS — L89313 Pressure ulcer of right buttock, stage 3: Secondary | ICD-10-CM | POA: Diagnosis present

## 2022-10-19 DIAGNOSIS — Z5982 Transportation insecurity: Secondary | ICD-10-CM | POA: Diagnosis not present

## 2022-10-19 DIAGNOSIS — L89329 Pressure ulcer of left buttock, unspecified stage: Principal | ICD-10-CM

## 2022-10-19 DIAGNOSIS — S24104S Unspecified injury at T11-T12 level of thoracic spinal cord, sequela: Secondary | ICD-10-CM | POA: Diagnosis not present

## 2022-10-19 DIAGNOSIS — Z881 Allergy status to other antibiotic agents status: Secondary | ICD-10-CM | POA: Diagnosis not present

## 2022-10-19 DIAGNOSIS — Z5901 Sheltered homelessness: Secondary | ICD-10-CM | POA: Diagnosis not present

## 2022-10-19 DIAGNOSIS — L89320 Pressure ulcer of left buttock, unstageable: Secondary | ICD-10-CM | POA: Diagnosis present

## 2022-10-19 DIAGNOSIS — G8220 Paraplegia, unspecified: Secondary | ICD-10-CM | POA: Diagnosis present

## 2022-10-19 DIAGNOSIS — D72829 Elevated white blood cell count, unspecified: Secondary | ICD-10-CM | POA: Diagnosis present

## 2022-10-19 DIAGNOSIS — M25552 Pain in left hip: Secondary | ICD-10-CM | POA: Diagnosis present

## 2022-10-19 DIAGNOSIS — K592 Neurogenic bowel, not elsewhere classified: Secondary | ICD-10-CM | POA: Diagnosis present

## 2022-10-19 DIAGNOSIS — D638 Anemia in other chronic diseases classified elsewhere: Secondary | ICD-10-CM | POA: Diagnosis present

## 2022-10-19 DIAGNOSIS — Z79899 Other long term (current) drug therapy: Secondary | ICD-10-CM

## 2022-10-19 DIAGNOSIS — L89323 Pressure ulcer of left buttock, stage 3: Secondary | ICD-10-CM | POA: Diagnosis present

## 2022-10-19 DIAGNOSIS — B999 Unspecified infectious disease: Secondary | ICD-10-CM | POA: Diagnosis present

## 2022-10-19 DIAGNOSIS — F17210 Nicotine dependence, cigarettes, uncomplicated: Secondary | ICD-10-CM | POA: Diagnosis present

## 2022-10-19 LAB — CBC WITH DIFFERENTIAL/PLATELET
Abs Immature Granulocytes: 0.04 10*3/uL (ref 0.00–0.07)
Basophils Absolute: 0 10*3/uL (ref 0.0–0.1)
Basophils Relative: 0 %
Eosinophils Absolute: 0.1 10*3/uL (ref 0.0–0.5)
Eosinophils Relative: 1 %
HCT: 30.6 % — ABNORMAL LOW (ref 39.0–52.0)
Hemoglobin: 9.4 g/dL — ABNORMAL LOW (ref 13.0–17.0)
Immature Granulocytes: 0 %
Lymphocytes Relative: 16 %
Lymphs Abs: 1.7 10*3/uL (ref 0.7–4.0)
MCH: 21.6 pg — ABNORMAL LOW (ref 26.0–34.0)
MCHC: 30.7 g/dL (ref 30.0–36.0)
MCV: 70.3 fL — ABNORMAL LOW (ref 80.0–100.0)
Monocytes Absolute: 0.9 10*3/uL (ref 0.1–1.0)
Monocytes Relative: 8 %
Neutro Abs: 8 10*3/uL — ABNORMAL HIGH (ref 1.7–7.7)
Neutrophils Relative %: 75 %
Platelets: 588 10*3/uL — ABNORMAL HIGH (ref 150–400)
RBC: 4.35 MIL/uL (ref 4.22–5.81)
RDW: 14.7 % (ref 11.5–15.5)
WBC: 10.7 10*3/uL — ABNORMAL HIGH (ref 4.0–10.5)
nRBC: 0 % (ref 0.0–0.2)

## 2022-10-19 LAB — IRON AND TIBC
Iron: 17 ug/dL — ABNORMAL LOW (ref 45–182)
Saturation Ratios: 9 % — ABNORMAL LOW (ref 17.9–39.5)
TIBC: 192 ug/dL — ABNORMAL LOW (ref 250–450)
UIBC: 175 ug/dL

## 2022-10-19 LAB — LACTIC ACID, PLASMA: Lactic Acid, Venous: 1.8 mmol/L (ref 0.5–1.9)

## 2022-10-19 LAB — COMPREHENSIVE METABOLIC PANEL
ALT: 11 U/L (ref 0–44)
AST: 16 U/L (ref 15–41)
Albumin: 3.1 g/dL — ABNORMAL LOW (ref 3.5–5.0)
Alkaline Phosphatase: 63 U/L (ref 38–126)
Anion gap: 8 (ref 5–15)
BUN: 14 mg/dL (ref 6–20)
CO2: 27 mmol/L (ref 22–32)
Calcium: 8.8 mg/dL — ABNORMAL LOW (ref 8.9–10.3)
Chloride: 104 mmol/L (ref 98–111)
Creatinine, Ser: 0.71 mg/dL (ref 0.61–1.24)
GFR, Estimated: >60 mL/min (ref >60)
Glucose, Bld: 93 mg/dL (ref 70–99)
Potassium: 3.7 mmol/L (ref 3.5–5.1)
Sodium: 139 mmol/L (ref 135–145)
Total Bilirubin: 0.5 mg/dL (ref 0.3–1.2)
Total Protein: 8.4 g/dL — ABNORMAL HIGH (ref 6.5–8.1)

## 2022-10-19 MED ORDER — OXYCODONE HCL 5 MG PO TABS
10.0000 mg | ORAL_TABLET | Freq: Four times a day (QID) | ORAL | Status: DC | PRN
  Administered 2022-10-19 – 2022-10-27 (×13): 10 mg via ORAL
  Filled 2022-10-19 (×14): qty 2

## 2022-10-19 MED ORDER — ONDANSETRON HCL 4 MG/2ML IJ SOLN: 4.0000 mg | Freq: Four times a day (QID) | INTRAMUSCULAR | Status: DC | PRN

## 2022-10-19 MED ORDER — VANCOMYCIN HCL IN DEXTROSE 1-5 GM/200ML-% IV SOLN: 1000.0000 mg | Freq: Once | INTRAVENOUS | Status: DC

## 2022-10-19 MED ORDER — ENOXAPARIN SODIUM 40 MG/0.4ML IJ SOSY
40.0000 mg | PREFILLED_SYRINGE | INTRAMUSCULAR | Status: DC
  Administered 2022-10-19 – 2022-10-23 (×5): 40 mg via SUBCUTANEOUS
  Filled 2022-10-19 (×8): qty 0.4

## 2022-10-19 MED ORDER — SODIUM CHLORIDE 0.9 % IV SOLN
2.0000 g | INTRAVENOUS | Status: DC
  Administered 2022-10-19: 2 g via INTRAVENOUS
  Filled 2022-10-19 (×2): qty 20

## 2022-10-19 MED ORDER — SODIUM CHLORIDE 0.9 % IV SOLN: 2.0000 g | INTRAVENOUS | Status: DC

## 2022-10-19 MED ORDER — ADULT MULTIVITAMIN W/MINERALS CH
1.0000 | ORAL_TABLET | Freq: Every day | ORAL | Status: DC
  Administered 2022-10-19 – 2022-10-27 (×9): 1 via ORAL
  Filled 2022-10-19 (×9): qty 1

## 2022-10-19 MED ORDER — ACETAMINOPHEN 325 MG PO TABS: 650.0000 mg | ORAL_TABLET | Freq: Four times a day (QID) | ORAL | Status: DC | PRN

## 2022-10-19 MED ORDER — SODIUM CHLORIDE 0.9 % IV SOLN: INTRAVENOUS | Status: AC

## 2022-10-19 MED ORDER — SENNOSIDES-DOCUSATE SODIUM 8.6-50 MG PO TABS
1.0000 | ORAL_TABLET | Freq: Every day | ORAL | Status: DC
  Administered 2022-10-19 – 2022-10-27 (×8): 1 via ORAL
  Filled 2022-10-19 (×9): qty 1

## 2022-10-19 MED ORDER — ONDANSETRON HCL 4 MG PO TABS: 4.0000 mg | ORAL_TABLET | Freq: Four times a day (QID) | ORAL | Status: DC | PRN

## 2022-10-19 MED ORDER — VITAMIN C 500 MG PO TABS
500.0000 mg | ORAL_TABLET | Freq: Two times a day (BID) | ORAL | Status: DC
  Administered 2022-10-19 – 2022-10-27 (×16): 500 mg via ORAL
  Filled 2022-10-19 (×16): qty 1

## 2022-10-19 MED ORDER — ACETAMINOPHEN 650 MG RE SUPP: 650.0000 mg | Freq: Four times a day (QID) | RECTAL | Status: DC | PRN

## 2022-10-19 MED ORDER — VANCOMYCIN HCL 1250 MG/250ML IV SOLN
1250.0000 mg | Freq: Once | INTRAVENOUS | Status: AC
  Administered 2022-10-19: 1250 mg via INTRAVENOUS
  Filled 2022-10-19: qty 250

## 2022-10-19 MED ORDER — PIPERACILLIN-TAZOBACTAM 3.375 G IVPB 30 MIN
3.3750 g | Freq: Once | INTRAVENOUS | Status: AC
  Administered 2022-10-19: 3.375 g via INTRAVENOUS
  Filled 2022-10-19: qty 50

## 2022-10-19 MED ORDER — ENSURE ENLIVE PO LIQD
237.0000 mL | Freq: Three times a day (TID) | ORAL | Status: DC
  Administered 2022-10-20 – 2022-10-27 (×21): 237 mL via ORAL

## 2022-10-19 MED ORDER — ZINC SULFATE 220 (50 ZN) MG PO CAPS
220.0000 mg | ORAL_CAPSULE | Freq: Every day | ORAL | Status: DC
  Administered 2022-10-19 – 2022-10-27 (×9): 220 mg via ORAL
  Filled 2022-10-19 (×9): qty 1

## 2022-10-19 MED ORDER — POLYETHYLENE GLYCOL 3350 17 G PO PACK
17.0000 g | PACK | Freq: Every day | ORAL | Status: DC
  Administered 2022-10-20 – 2022-10-26 (×4): 17 g via ORAL
  Filled 2022-10-19 (×8): qty 1

## 2022-10-19 MED ORDER — VITAMIN D 25 MCG (1000 UNIT) PO TABS
5000.0000 [IU] | ORAL_TABLET | Freq: Every day | ORAL | Status: DC
  Administered 2022-10-19 – 2022-10-27 (×9): 5000 [IU] via ORAL
  Filled 2022-10-19 (×9): qty 5

## 2022-10-19 NOTE — ED Notes (Signed)
IV insertion attempted x 2, no success, IV team consult placed. Pt states he usually has to have IV team place Ivs.

## 2022-10-19 NOTE — Assessment & Plan Note (Signed)
Moderate Malnutrition.  BMI 18.31 Etiology: chronic illness (chronic non-healing wounds) Signs/Symptoms: moderate fat depletion, moderate muscle depletion, severe muscle depletion Nutrition following.  On Magic cup 3 times daily plus Ensure Enlive and vitamin supplementation.
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
## 2022-10-19 NOTE — Assessment & Plan Note (Addendum)
Patient with known decubitus ulcer of left ischium status post debridement about a month ago who presents for evaluation of increased purulent drainage and pain in the wound. Patient had a wound VAC placed during his last hospitalization following the debridement but has not been able to follow-up consistently at the wound care clinic for wound [facility] care and eventually took the wound VAC off because the battery died. He is also homeless and most times is sitting in his wheelchair which most likely has worsened his pressure injury Wound culture yielded Proteus Mirabillis sensitive to cephalosporins We will request wound care consult Appreciate surgical input

## 2022-10-19 NOTE — ED Notes (Signed)
Pt cleaned, placed in new brief with new chuck put underneath pt.

## 2022-10-19 NOTE — Assessment & Plan Note (Addendum)
Patient has had a 4 g drop in his H&H over the last 6 months Baseline hemoglobin of 13.5 today on admission it is 9.4 He also has microcytosis suggestive of chronic blood loss Check iron levels Monitor H&H

## 2022-10-19 NOTE — ED Triage Notes (Signed)
Pt states he is a paraplegic  and has a pressure ulcer to the buttock that is hurting him and draining. Pt states his left hip is also hurting him. Pt states he is currently on antibiotics due to the pressure ulcer. Pt does not recall the name of the antibiotic.

## 2022-10-19 NOTE — Consult Note (Signed)
PHARMACY -  BRIEF ANTIBIOTIC NOTE   Pharmacy has received consult(s) for vancomycin dosing from an ED provider.  The patient's profile has been reviewed for ht/wt/allergies/indication/available labs.    One time order(s) placed for vancomycin 1250 mg IV  Further antibiotics/pharmacy consults should be ordered by admitting physician if indicated.                       Thank you, Barrie Folk, PharmD 10/19/2022  2:33 PM

## 2022-10-19 NOTE — Progress Notes (Signed)
CODE SEPSIS - PHARMACY COMMUNICATION  **Broad Spectrum Antibiotics should be administered within 1 hour of Sepsis diagnosis**  Time Code Sepsis Called/Page Received: 1422  Antibiotics Ordered: ceftriaxone and vancomycin  Time of 1st antibiotic administration: 1733  Additional action taken by pharmacy: Messaged RN, unable to get IV access. IV team consult.   If necessary, Name of Provider/Nurse Contacted: M. Mel Almond, RN    Barrie Folk ,PharmD Clinical Pharmacist  10/19/2022  2:34 PM
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
## 2022-10-19 NOTE — ED Notes (Addendum)
Pt with 4 inch by 4 inch deep decub on left buttock. This RN with Dr. Fuller Plan during assessment. No drainage noted in wound, serosanguinous drainage noted on dressings. Pt with healing decub on right buttock,2 small fissures noted, skin red inside fissures, no drainage noted. Pt alert, pt asked for and given supplies to have BM.
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
## 2022-10-20 ENCOUNTER — Inpatient Hospital Stay: Payer: Medicaid Other

## 2022-10-20 ENCOUNTER — Encounter: Payer: Self-pay | Admitting: Internal Medicine

## 2022-10-20 DIAGNOSIS — L89320 Pressure ulcer of left buttock, unstageable: Secondary | ICD-10-CM

## 2022-10-20 LAB — CBC
HCT: 28 % — ABNORMAL LOW (ref 39.0–52.0)
Hemoglobin: 8.6 g/dL — ABNORMAL LOW (ref 13.0–17.0)
MCH: 21.4 pg — ABNORMAL LOW (ref 26.0–34.0)
MCHC: 30.7 g/dL (ref 30.0–36.0)
MCV: 69.7 fL — ABNORMAL LOW (ref 80.0–100.0)
Platelets: 571 10*3/uL — ABNORMAL HIGH (ref 150–400)
RBC: 4.02 MIL/uL — ABNORMAL LOW (ref 4.22–5.81)
RDW: 14.5 % (ref 11.5–15.5)
WBC: 9.7 10*3/uL (ref 4.0–10.5)
nRBC: 0 % (ref 0.0–0.2)

## 2022-10-20 LAB — BASIC METABOLIC PANEL
Anion gap: 6 (ref 5–15)
BUN: 13 mg/dL (ref 6–20)
CO2: 25 mmol/L (ref 22–32)
Calcium: 8.2 mg/dL — ABNORMAL LOW (ref 8.9–10.3)
Chloride: 107 mmol/L (ref 98–111)
Creatinine, Ser: 0.73 mg/dL (ref 0.61–1.24)
GFR, Estimated: >60 mL/min (ref >60)
Glucose, Bld: 94 mg/dL (ref 70–99)
Potassium: 3.7 mmol/L (ref 3.5–5.1)
Sodium: 138 mmol/L (ref 135–145)

## 2022-10-20 LAB — LACTIC ACID, PLASMA: Lactic Acid, Venous: 0.8 mmol/L (ref 0.5–1.9)

## 2022-10-20 MED ORDER — CEFADROXIL 500 MG PO CAPS
500.0000 mg | ORAL_CAPSULE | Freq: Two times a day (BID) | ORAL | Status: AC
  Administered 2022-10-20 – 2022-10-24 (×10): 500 mg via ORAL
  Filled 2022-10-20 (×10): qty 1
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
## 2022-10-20 NOTE — ED Notes (Addendum)
This RN went into pt's room and informed pt I would be cleaning his wounds and changing the dressings. Pt got very upset stating "I just went to sleep. Why do you want to do this in the middle of the night? I literally just went to sleep. This can get done later." Pt educated on importance of cleaning the wounds. Pt was insistent that wound care could be provided when he isn't trying to sleep.

## 2022-10-20 NOTE — ED Notes (Signed)
Pt's moisture pad changed. Assisted pt with changing brief.

## 2022-10-20 NOTE — ED Notes (Signed)
Informed RN bed assigned 

## 2022-10-20 NOTE — Consult Note (Signed)
WOC Nurse Consult Note: Reason for Consult:Bilateral ischial tuberosity pressure injuries, Stage 3. Patient seen by surgery (Dr. Tonna Boehringer) yesterday for these wounds and he has provided guidance for care. Wound type: See note from that Encounter. Pressure Injury POA: Yes Measurement:To be measured by Bedside RN and documented on Nursing  Flow Sheet today with next dressing change. Wound bed:See photo of Left IT wound uploaded to EMR yesterday. Red, moist, clean Drainage (amount, consistency, odor) moderate serous Periwound:intact Dressing procedure/placement/frequency:Dr. Tonna Boehringer has provided guidance for Nursing to apply a NS dressing and to change daily and PRN. I have increased the scheduled dressing changes to twice daily and PRN drainage strike through onto exterior of dressing for more predictable dressing changes and to prevent ND from drying out.  WOC nursing team will not follow, but will remain available to this patient, the nursing and medical teams.  Please re-consult if needed.  Thank you for inviting [place] to participate in this patient's Plan of Care.  Ladona Mow, MSN, RN, CNS, GNP, Leda Min, [employer], [employer] phone:  [phone]

## 2022-10-21 ENCOUNTER — Encounter (HOSPITAL_COMMUNITY): Payer: Self-pay

## 2022-10-21 DIAGNOSIS — L89320 Pressure ulcer of left buttock, unstageable: Secondary | ICD-10-CM | POA: Diagnosis not present

## 2022-10-21 LAB — CBC
HCT: 30.2 % — ABNORMAL LOW (ref 39.0–52.0)
Hemoglobin: 9.3 g/dL — ABNORMAL LOW (ref 13.0–17.0)
MCH: 21.5 pg — ABNORMAL LOW (ref 26.0–34.0)
MCHC: 30.8 g/dL (ref 30.0–36.0)
MCV: 69.7 fL — ABNORMAL LOW (ref 80.0–100.0)
Platelets: 610 10*3/uL — ABNORMAL HIGH (ref 150–400)
RBC: 4.33 MIL/uL (ref 4.22–5.81)
RDW: 14.6 % (ref 11.5–15.5)
WBC: 7.1 10*3/uL (ref 4.0–10.5)
nRBC: 0 % (ref 0.0–0.2)
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
## 2022-10-22 DIAGNOSIS — L89320 Pressure ulcer of left buttock, unstageable: Secondary | ICD-10-CM | POA: Diagnosis not present
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
## 2022-10-23 DIAGNOSIS — L89320 Pressure ulcer of left buttock, unstageable: Secondary | ICD-10-CM | POA: Diagnosis not present

## 2022-10-23 MED ORDER — DIPHENHYDRAMINE HCL 25 MG PO CAPS
50.0000 mg | ORAL_CAPSULE | Freq: Once | ORAL | Status: AC
  Administered 2022-10-23: 50 mg via ORAL
  Filled 2022-10-23: qty 2

## 2022-10-23 MED ORDER — SODIUM CHLORIDE 0.9 % IV SOLN
510.0000 mg | Freq: Once | INTRAVENOUS | Status: AC
  Administered 2022-10-23: 510 mg via INTRAVENOUS
  Filled 2022-10-23: qty 17

## 2022-10-23 MED ORDER — FERROUS SULFATE 325 (65 FE) MG PO TABS
325.0000 mg | ORAL_TABLET | Freq: Every day | ORAL | Status: DC
  Administered 2022-10-23 – 2022-10-27 (×5): 325 mg via ORAL
  Filled 2022-10-23 (×5): qty 1

## 2022-10-23 NOTE — Progress Notes (Signed)
I called the [facility] transfer line earlier today.  I received a response this afternoon from them stating that there were no inpatient beds available at the hospital and there was no wait list available.  I also talked to the on-call orthopedic surgeon who recommended outpatient follow-up given the fact that no beds were available and we would not know how long it would be until transfer can happen.  Additionally, patient may require postoperative radiation therapy as well as wound care and would have postoperative appointments that he would have to arrange as well in order to optimize his outcome.  Patient will need to have reliable transportation regardless of when surgery may occur.  Therefore, we agreed that next best step to address the patient's left hip heterotopic ossification was outpatient follow-up with Dr. Josefine Class.  These findings were relayed to the primary medical team and the patient.  Current plan given his homelessness will be for transfer to a skilled nursing facility for now.

## 2022-10-23 NOTE — TOC Progression Note (Signed)
Transition of Care [facility]) - Progression Note    Patient Details  Name: [name] MRN: [id number] Date of Birth: [birth year]  [facility] CM/SW Contact  Chapman Fitch, RN Phone Number: 10/23/2022, 11:51 AM  Clinical Narrative:     Secure chat sent to MD to determine if there was an update on transfer to [facility], and notified team that patient was not receptive to [facility] resources yesterday    Expected Discharge Plan: Acute to Acute Transfer Barriers to Discharge: Continued Medical Work up  Expected Discharge Plan and Services Expected Discharge Plan: Acute to Acute Transfer       Living arrangements for the past 2 months: Homeless                                       Social Determinants of Health (SDOH) Interventions    Readmission Risk Interventions    10/15/2020    3:33 PM  Readmission Risk Prevention Plan  Transportation Screening Complete  PCP or Specialist Appt within 5-7 Days Complete  Home Care Screening Complete  Medication Review (RN CM) Complete
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
## 2022-10-23 NOTE — Progress Notes (Signed)
       CROSS COVER NOTE  NAME: [name] MRN: [id number] DOB : Mar 23, 1991 ATTENDING PHYSICIAN: Burnadette Pop, MD    Date of Service   10/23/2022   HPI/Events of Note   Medication request received from patient for sleep aid, patient reports he normally takes 50 mg Benadryl at home. Reports Melatonin is ineffective for him.  Interventions   Assessment/Plan:  50 mg Benadryl x1    This document was prepared using Dragon voice recognition software and may include unintentional dictation errors.  Bishop Limbo DNP, MBA, FNP-BC Nurse Practitioner [facility] Pager [phone]
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
## 2022-10-24 DIAGNOSIS — L89320 Pressure ulcer of left buttock, unstageable: Secondary | ICD-10-CM | POA: Diagnosis not present

## 2022-10-24 LAB — CULTURE, BLOOD (ROUTINE X 2)
Culture: NO GROWTH
Culture: NO GROWTH
Special Requests: ADEQUATE
Special Requests: ADEQUATE
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
## 2022-10-24 NOTE — TOC Progression Note (Signed)
Transition of Care [facility]) - Progression Note    Patient Details  Name: [name] MRN: [id number] Date of Birth: 1991/06/02  [facility] CM/SW Contact  Chapman Fitch, RN Phone Number: 10/24/2022, 10:14 AM  Clinical Narrative:     Patient notified by MD that he will not be transferred to [facility].   Follow up with patient about discharge planning.  He confirms he has no where to go at discharge, and no support from family or friends  Patient agreeable to SNF search for LTC under medicaid.   Patient is [place] of county, and he will be required to turn his monthly check over to the facility.   Patient aware that at discharge EMS will not transport his WC to the facility.    Expected Discharge Plan: Acute to Acute Transfer Barriers to Discharge: Continued Medical Work up  Expected Discharge Plan and Services Expected Discharge Plan: Acute to Acute Transfer       Living arrangements for the past 2 months: Homeless                                       Social Determinants of Health (SDOH) Interventions    Readmission Risk Interventions    10/15/2020    3:33 PM  Readmission Risk Prevention Plan  Transportation Screening Complete  PCP or Specialist Appt within 5-7 Days Complete  Home Care Screening Complete  Medication Review (RN CM) Complete
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
## 2022-10-25 DIAGNOSIS — L89320 Pressure ulcer of left buttock, unstageable: Secondary | ICD-10-CM | POA: Diagnosis not present

## 2022-10-25 MED ORDER — VITAMIN A 3 MG (10000 UNIT) PO CAPS
10000.0000 [IU] | ORAL_CAPSULE | Freq: Every day | ORAL | Status: DC
  Administered 2022-10-26 – 2022-10-27 (×2): 10000 [IU] via ORAL
  Filled 2022-10-25 (×2): qty 1

## 2022-10-25 NOTE — TOC Progression Note (Signed)
Transition of Care [facility]) - Progression Note    Patient Details  Name: CHIBUIKE FLEEK MRN: [id number] Date of Birth: 1991/05/11  Transition of Care [facility]) CM/SW Contact  Chapman Fitch, RN Phone Number: 10/25/2022, 11:06 AM  Clinical Narrative:      Per Missy at [facility].  They are unable to offer as their age requirements are 65 and up  Awaiting response from Debbie at [facility]   Expected Discharge Plan: Acute to Acute Transfer Barriers to Discharge: Continued Medical Work up  Expected Discharge Plan and Services Expected Discharge Plan: Acute to Acute Transfer       Living arrangements for the past 2 months: Homeless                                       Social Determinants of Health (SDOH) Interventions    Readmission Risk Interventions    10/15/2020    3:33 PM  Readmission Risk Prevention Plan  Transportation Screening Complete  PCP or Specialist Appt within 5-7 Days Complete  Home Care Screening Complete  Medication Review (RN CM) Complete
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
## 2022-10-25 NOTE — TOC Progression Note (Signed)
Transition of Care [facility]) - Progression Note    Patient Details  Name: DIMITRIY CARRERAS MRN: [id number] Date of Birth: 01-01-1991  [facility]) CM/SW Contact  Chapman Fitch, RN Phone Number: 10/25/2022, 3:10 PM  Clinical Narrative:     Message sent x2 to Debbie at [facility]) to follow up on referral review.  TOC supervisor updated  Expected Discharge Plan: Acute to Acute Transfer Barriers to Discharge: Continued Medical Work up  Expected Discharge Plan and Services Expected Discharge Plan: Acute to Acute Transfer       Living arrangements for the past 2 months: Homeless                                       Social Determinants of Health (SDOH) Interventions    Readmission Risk Interventions    10/15/2020    3:33 PM  Readmission Risk Prevention Plan  Transportation Screening Complete  PCP or Specialist Appt within 5-7 Days Complete  Home Care Screening Complete  Medication Review (RN CM) Complete

## 2022-10-25 NOTE — Consult Note (Addendum)
WOC Nurse re-consult Note: Requested to reassess wounds for possible Vac placement.  Refer to previous surgical consult on 11/23 and WOC remote consult on 11/24.  Moist gauze dressings have been ordered already.  Discussed plan of care via Secure chat with surgical team and case manager. We are all in agreement to the following; I have included my Secure chat message below, "Someone requested that I reapply a Vac to this patient's wounds. I do not see an order for this. Previously, the patient was discharged with a Vac and was not in the appropriate setting; he was homeless and living in a hotel, could not have home health, could not maintain a Vac dressing or a charge to the machine, did not have adequate dressing change assistance. Refer to primary team's previous progress notes in the EMR. For this reason, I DO NOT recommend reapplying the Vac. This is NOT a feasible option when patients do not have home health or are not going to a SNF. AGREE with comments from the rest of the team. Instead, continue moist gauze dressings. I do not know how this patient got a home Vac machine approved and delivered previously.  Discussed plan of care with patient.  He declines my offer to assess and measure the wounds today.  He stated, "if you are not going to apply the Vac, then what is the point." I offered to apply a hospital Vac tomorrow, but he declined and wanted to wait in case this reduced his chance of SNF placement. Listened to the patient voice his list of concerns and his plan of action for wound care for approx 1/2 hour.    1. He states he would prefer to obtain placement at a SNF where he would have assistance with wound care to promote healing after discharge from the hospital.  If this is easier to occur without the Vac, then he is willing to leave it off. Explained to him that the case manager is currently working on possible placement, but  they had to widen the search to farther away and do not know if  he will be accepted at this point.  2. He states that he will discharge himself on Friday if he does not have placement by that time. He plans to return to the hotel where he was previously staying. He states he has a woman friend who was previously trained, during his last recent admission, to change the Vac dressing and she plans to continue to change the dressings after discharge.  He denies missing any dressing changes prior to this admission.  3. I asked him what he planned to do about the times he did not have a charge for the Vac machine and the battery died and the Vac suction stopped working.  He stated he would keep it charged this time. 4. I asked him where he would obtain the additional Vac canisters and dressings and he stated they are at his woman friend's house who plans to perform the dressing changes. 5. Currently, there is a home Vac machine at the bedside but it is not charged, and there is no charger in the room.  He states he will have the woman friend bring it in to be ready to use the Vac upon discharge.  6. I agreed to reapply the Vac as he is requesting on Fri to prepare for discharge, if he does not obtain SNF placement.  He was in agreement with the plans outlined above.   Thank-you,  Julien Girt MSN, RN, [place], [place], [place]

## 2022-10-25 NOTE — TOC Progression Note (Signed)
Transition of Care [facility]) - Progression Note    Patient Details  Name: [name] MRN: [id number] Date of Birth: February 10, 1991  Transition of Care [facility]) CM/SW Contact  Chapman Fitch, RN Phone Number: 10/25/2022, 9:50 AM  Clinical Narrative:    Notified by [facility] supervisor that [facility] director has requested that [facility]) reached out to again, and for referral to be sent to [employer]  Debbie at [place] to Review Missy at [facility] in a meeting and requested I call again at back at 1030    Expected Discharge Plan: Acute to Acute Transfer Barriers to Discharge: Continued Medical Work up  Expected Discharge Plan and Services Expected Discharge Plan: Acute to Acute Transfer       Living arrangements for the past 2 months: Homeless                                       Social Determinants of Health (SDOH) Interventions    Readmission Risk Interventions    10/15/2020    3:33 PM  Readmission Risk Prevention Plan  Transportation Screening Complete  PCP or Specialist Appt within 5-7 Days Complete  Home Care Screening Complete  Medication Review (RN CM) Complete

## 2022-10-26 DIAGNOSIS — L89320 Pressure ulcer of left buttock, unstageable: Secondary | ICD-10-CM | POA: Diagnosis not present

## 2022-10-26 LAB — CBC
HCT: 37.8 % — ABNORMAL LOW (ref 39.0–52.0)
Hemoglobin: 11.3 g/dL — ABNORMAL LOW (ref 13.0–17.0)
MCH: 21.1 pg — ABNORMAL LOW (ref 26.0–34.0)
MCHC: 29.9 g/dL — ABNORMAL LOW (ref 30.0–36.0)
MCV: 70.5 fL — ABNORMAL LOW (ref 80.0–100.0)
Platelets: 846 10*3/uL — ABNORMAL HIGH (ref 150–400)
RBC: 5.36 MIL/uL (ref 4.22–5.81)
RDW: 15.5 % (ref 11.5–15.5)
WBC: 6.1 10*3/uL (ref 4.0–10.5)
nRBC: 0 % (ref 0.0–0.2)

## 2022-10-26 LAB — BASIC METABOLIC PANEL
Anion gap: 9 (ref 5–15)
BUN: 25 mg/dL — ABNORMAL HIGH (ref 6–20)
CO2: 28 mmol/L (ref 22–32)
Calcium: 9.3 mg/dL (ref 8.9–10.3)
Chloride: 99 mmol/L (ref 98–111)
Creatinine, Ser: 0.75 mg/dL (ref 0.61–1.24)
GFR, Estimated: >60 mL/min (ref >60)
Glucose, Bld: 102 mg/dL — ABNORMAL HIGH (ref 70–99)
Potassium: 4.4 mmol/L (ref 3.5–5.1)
Sodium: 136 mmol/L (ref 135–145)

## 2022-10-26 NOTE — Progress Notes (Signed)
WOCN requested that patient request a friend/family to  bring charging cord for wound vac in order for it to be placed tomorrow.  Patient informed of request and states he understands

## 2022-10-26 NOTE — Plan of Care (Signed)

## 2022-10-26 NOTE — TOC Progression Note (Signed)
Transition of Care [facility]) - Progression Note    Patient Details  Name: [name] MRN: [id number] Date of Birth: [birth year]  Transition of Care [facility]) CM/SW Contact  Chapman Fitch, RN Phone Number: 10/26/2022, 9:37 AM  Clinical Narrative:     No bed offers in hub Third message sent to Debbie at [facility] to determine if DON has reviewed referral.  TOC supervisor notified   Expected Discharge Plan: Acute to Acute Transfer Barriers to Discharge: Continued Medical Work up  Expected Discharge Plan and Services Expected Discharge Plan: Acute to Acute Transfer       Living arrangements for the past 2 months: Homeless                                       Social Determinants of Health (SDOH) Interventions    Readmission Risk Interventions    10/15/2020    3:33 PM  Readmission Risk Prevention Plan  Transportation Screening Complete  PCP or Specialist Appt within 5-7 Days Complete  Home Care Screening Complete  Medication Review (RN CM) Complete

## 2022-10-26 NOTE — Discharge Instructions (Signed)
Rent/Utility/Housing  Agency Name: [facility] Agency Address: [address] [place], [place] [address] Phone: [phone] Email: [email or web address] Website: [email or web address] Service(s) Offered: Housing services, self-sufficiency, congregate meal  program, weatherization program, Administrator, sports program, emergency food assistance,  housing counseling, home ownership program, wheels -towork program.  Agency Name: [place] Address: [id number], [place], [place] [address] Phone: [phone] (8a-[phone] (8p- [phone]) Email: [email or web address] Website: [email or web address] Service(s) Offered: A program for homeless and/or needy men that includes one-on-one counseling, life skills training and job rehabilitation.  Agency Name: [employer] [facility] [place] Address: [address] [place], [place] [address] Phone: [phone] Website: www.alliedchurches.org Service(s) Offered: Assistance to needy in emergency with utility bills, heating  fuel, and prescriptions. Shelter for homeless 7pm-7am. March 22, 2017 15  Agency Name: [facility] [place] (Developmentally Disabled) Address: [address], [place], [place] [address] Phone: [phone] Contact Person: Susanne Greenhouse Email: [email or web address] Website: [email or web address] Service(s) Offered: Helps individuals with developmental disabilities move  from housing that is more restrictive to homes where they  can achieve greater independence and have more  opportunities.  Agency Name: [employer] Address: [address], [place], [place] [address] Phone: [phone] Email: [email or web address] Website: www.burlingtonhousingauthority.org Service(s) Offered: Provides affordable housing for low-income families,  elderly, and disabled individuals. Offer a wide range of  programs and services, from financial planning  to afterschool and summer programs.  Agency Name: [place] Address: [address] [place], [place] [address] Phone: [phone] Service(s) Offered: Child support services; child welfare services; food stamps;  Medicaid; work first family assistance; and aid with fuel,  rent, food and medicine.  Agency Name: [facility] [place]. Address: [facility] [address], [place], [place]  [address] Phone: [phone] Website: www.familyabuseservices.org Service(s) Offered: 24 hour Crisis Line: [phone]; 24 hour Emergency Shelter;  Transitional Housing; Support Groups; Lexicographer;  [employer]; Hispanic Outreach: 6788701182;  [place]: [phone]. March 22, 2017 16  Agency Name: [facility] Address: [address] [place], [place] [address] Phone: [phone] Service(s) Offered: CAP Services; Home and [employer]; Individual  or Group Supports; Respite Care Non-Institutional Nursing;  Residential Supports; Respite Care and [place]; Transportation; Family and Friends Night;  Recreational Activities; Three Nutritious Meals/Snacks;  Consultation with Registered Dietician; Twenty-four hour  Registered Nurse Access; Daily and [employer]; [place]; [place] for the [employer] (During Summer Months) Bingo Night (Every  Wednesday Night); Special Populations Dance Night  (Every Tuesday Night); Professional Hair Care Services.  Agency Name: [facility] Address: [address], [place], [place] [address] Phone: [phone] Contact Person: Flonnie Hailstone Website: [email or web address].Pharmacist, hospital) Offered: Residential treatment facility for women; food and  clothing, educational & employment development and  transportation to work; Psychologist, counselling of financial skills;  parenting and family  reunification; emotional and spiritual  support; transitional housing for program graduates.  Agency Name: [employer] Address: [address], [place], [place] [address] Phone: [phone] Email: [email or web address] Website: http://www.[email or web address].biz/ Service(s) Offered: Public housing units for elderly, disabled, and low income  people; housing choice vouchers for income eligible  applicants; shelter plus care vouchers; and [employer] program. March 22, 2017 17  Agency Name: [facility] Address: [place] [address] [place], [place] [address] Phone: [phone] Email: [email or web address] Website: www.habitatalamance.org Service(s) Offered: Build houses for families in need of decent housing. Each  adult in the family must invest 200 hours of labor on  someone else's house, work with volunteers to build their  own house, attend classes on budgeting, home maintenance, yard care, and attend homeowner association  meetings.  Agency Name: [facility] Address: [address] [place], [place] [address] Phone: [phone] Website: www.rsli.org Service(s) Offered: Intermediate care facilities for mentally retarded,  Supervised Living in group homes for adults with  developmental disabilities, Supervised Living for people  who have dual diagnoses (MRMI), Independent Living,  Supported Living, respite and a variety of CAP services,  pre-vocational services, day supports, and [employer].  Agency Name: [place] [employer] Phone: [phone] Website: www.NCForeclosurePrevention.gov Service(s) Offered: Zero-interest, deferred loans to homeowners struggling to  pay their mortgage. Call for more information  Food Resources  Agency Name: [facility] Agency Address: [address] [place], [place] [address] Phone: [phone] Website: www.alamanceservices.org  Service(s) Offered:  Housing services, self-sufficiency, congregate meal  program, weatherization program, Field seismologist program, emergency food assistance,  housing counseling, home ownership program, wheels -towork program. Meals free for 60 and older at various  locations from 9am-1pm, Monday-Friday:  [employer], [address] [place], [phone] [facility], [address] [place] [phone]   [facility], [address],   [place] [phone] The [address], [address]   [place], [phone]  Agency Name: [facility] Address: [phone] [address], [place], [place] [address] Phone: [phone] Website: www.alamancemow.org Service(s) Offered: Home delivered hot, frozen, and emergency  meals. Grocery assistance program which matches  volunteers one-on-one with seniors unable to grocery shop  for themselves. Must be 60 years and older; less than 20  hours of in-home aide service, limited or no driving ability;  live alone or with someone with a disability; live in  [place].  Agency Name: [facility]) Address: [address] [place], [place] [address] Phone: [phone] Service(s) Offered: Food is served to shut-ins, homeless, elderly, and low  income people in the community every Saturday (11:30  am-12:30 pm) and Sunday (12:30 pm-1:30pm). Volunteers  also offer help and encouragement in seeking employment,  and spiritual guidance. March 22, 2017 8  Agency Name: [facility] Social Services Address: [address] [place], [place] [address] Phone: [phone] Service(s) Offered: Child support services; child welfare services; food stamps;  Medicaid; work first family assistance; and aid with fuel,  rent, food and medicine.  Agency Name: Dietitian Address: [address], [place], [place] Phone: [phone] Website:  www.dreamalign.com Services Offered: Monday 10:00am-12:00, 8:00pm-9:00pm, and Friday  10:00am-12:00. Agency Name: [employer] [facility] [place] Address: [address], [place], [place] [address] Phone: [phone] Website: www.alliedchurches.org Service(s) Offered: Serves weekday meals, open from 11:30 am- 1:00 pm., and  6:30-7:30pm, Monday-Wednesday-Friday distributes food  3:30-6pm, Monday-Wednesday-Friday.  Agency Name: [facility] Address: [address] [place], [place] Phone: [phone] Website: www.[email or web address].org Services Offered: Distributes food the 4th Saturday of the month, starting at  8:00 am Agency Name: [facility] Address: [phone] [place], [place] [address] Phone: [phone] Website: http://hbc.[email or web address].net Service(s) Offered: Bread of life, weekly food pantry. Open Wednesdays from  10:00am-noon.  Agency Name: [facility] [employer] [facility] Address: [address] [place], [place], [place] Phone: [phone] Services Offered: Distributes food 9am-1pm, Monday-Thursday. Call for details.   Agency Name: [facility] Address: [address]  [place], [place] [address] Phone: [phone] Website: firstbaptistburlington.com Service(s) Offered: Games developer. Call for assistance. Agency Name: [facility] Address: [address] [place], [place] [address] Phone: [phone] Service Offered: Emergency Food Pantry. Call for appointment.  Agency Name: [facility] Address: [address], [place], [place] [address] Phone: [phone] Website: [email or web address] Services Offered: Games developer. Call for details Agency Name: [facility] Address: [address] [place], [place] Phone: [phone] Website: [email or web address] Service(s) Offered: Emergency Food Pantry. Call for details.  Agency Name: Holiday representative Address: [address] [place], [place] [address] Phone: [phone] or [phone] Website: [email or web address].salvationarmy.[email or web address] Service(s) Offered: Distribute food 9am-11:30 am, Tuesday-Friday, and 1- 3:30pm, Monday-Friday. Food pantry Monday-Friday  1pm-3pm, fresh items, Mon.-Wed.-Fri.  Agency Name: [facility] Address: [address] [place], [place] [address] Phone: [phone] Website: www.safealamance.org Services Offered: Distribute food Tues and Sats from 9:00am-noon. Closed  1st Saturday of each month. Call for details  Transportation Agency Name: [facility] Agency Address: [address] [place], [place] [address] Phone: [phone] Email: [email or web address] Website: www.[email or web address].org Service(s) Offered: [employer] Stores, self-sufficiency, congregate meal  program, weatherization program, Field seismologist program, emergency food assistance,  housing counseling, home ownership program, wheels-towork program. March 22, 2017 22  Agency Name: [facility] [employer] [phone]) Address: [address], [place], [place] [address] Phone: [phone] Email:  Website: www.acta-Ozan.[email or web address] Service(s) Offered: Transportation for general public, subscription and demand  response; Dial-a-Ride for citizens 69 years of age or older. Agency Name: Department of Social Services Address: [address] [place], [place] [address] Phone: [phone] Service(s) Offered: Child support services; child welfare services; food stamps;  Medicaid; work first family assistance; and aid with fuel,  rent, food and medicine, transportation assistance.  Agency Name: Disabled [employer] (DAV) Transportation  Network Address: Phone: [phone] Service(s) Offered: Transports veterans to the [facility] medical center. Call  forty-eight hours in advance and leave the name, telephone  number, date,  and time of appointment. Veteran will be  contacted by the driver the day before the appointment to  arrange a pick up point
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
## 2022-10-27 ENCOUNTER — Ambulatory Visit: Payer: Medicaid Other | Admitting: Physician Assistant

## 2022-10-27 ENCOUNTER — Other Ambulatory Visit: Payer: Self-pay

## 2022-10-27 DIAGNOSIS — L89320 Pressure ulcer of left buttock, unstageable: Secondary | ICD-10-CM | POA: Diagnosis not present

## 2022-10-27 MED ORDER — BACLOFEN 20 MG PO TABS: 40.0000 mg | ORAL_TABLET | Freq: Three times a day (TID) | ORAL | 11 refills | Status: DC | PRN

## 2022-10-27 MED ORDER — ASCORBIC ACID 500 MG PO TABS
500.0000 mg | ORAL_TABLET | Freq: Two times a day (BID) | ORAL | 1 refills | Status: DC
  Filled 2022-10-27: qty 60, 30d supply, fill #0

## 2022-10-27 MED ORDER — POLYETHYLENE GLYCOL 3350 17 GM/SCOOP PO POWD
17.0000 g | Freq: Every day | ORAL | 0 refills | Status: DC
  Filled 2022-10-27: qty 238, 14d supply, fill #0

## 2022-10-27 MED ORDER — FERROUS SULFATE 325 (65 FE) MG PO TABS
325.0000 mg | ORAL_TABLET | Freq: Every day | ORAL | 3 refills | Status: DC
  Filled 2022-10-27: qty 30, 30d supply, fill #0

## 2022-10-27 MED ORDER — VITAMIN A 3 MG (10000 UNIT) PO CAPS
10000.0000 [IU] | ORAL_CAPSULE | Freq: Every day | ORAL | 0 refills | Status: DC
  Filled 2022-10-27: qty 30, 30d supply, fill #0

## 2022-10-27 MED ORDER — ZINC SULFATE 220 (50 ZN) MG PO CAPS
220.0000 mg | ORAL_CAPSULE | Freq: Every day | ORAL | 1 refills | Status: DC
  Filled 2022-10-27: qty 30, 30d supply, fill #0

## 2022-10-27 NOTE — TOC Progression Note (Addendum)
Transition of Care [facility]) - Progression Note    Patient Details  Name: [name] MRN: [id number] Date of Birth: 10/15/1991  Transition of Care [facility]) CM/SW Contact  Liliana Cline, LCSW Phone Number: 10/27/2022, 12:31 PM  Clinical Narrative:    Consulted with Wound RN, MD, and TOC Supervisor. Patient told Wound RN he would DC today if he has no SNF beds. [facility] will not accept patients without a clear DC plan once they finish Inpatient Rehab (per Admissions Worker Theodoro Grist). Per [facility] Leadership, patient can DC to hotel with friend assisting with wound vac. Per Wound Care RN, patient has all needed supplies for home wound vac. CSW reached out to the following [facility] agencies and none were able to accept patient : [facility], [facility], [facility], [facility], [facility], [facility], and [facility]. CSW updated patient. Patient states he spoke to [facility] yesterday and they told him they had beds, he was not sure who he spoke to and did not want to call back. CSW informed patient that for referral he would need to specify his DC plan and that they stated they would not have beds for 1-2 weeks. Patient became agitated and stated he will have a plan by the time he is ready to leave Inpatient Rehab. Patient hung up the phone. TOC Leadership updated and following up with patient at bedside.  Per MD note on 11/30 "If he cannot be discharged to SNF, patient is planning to go to his hotel on Friday."   Expected Discharge Plan: Acute to Acute Transfer Barriers to Discharge: Continued Medical Work up  Expected Discharge Plan and Services Expected Discharge Plan: Acute to Acute Transfer       Living arrangements for the past 2 months: Homeless                                       Social Determinants of Health (SDOH) Interventions Food Insecurity Interventions: Other (Comment), Inpatient TOC (Resources added to AVS) Housing Interventions: Inpatient TOC, Other (Comment)  (Resources added to AVS) Transportation Interventions: Other (Comment), Inpatient TOC (resources added to AVS)  Readmission Risk Interventions    10/15/2020    3:33 PM  Readmission Risk Prevention Plan  Transportation Screening Complete  PCP or Specialist Appt within 5-7 Days Complete  Home Care Screening Complete  Medication Review (RN CM) Complete

## 2022-10-27 NOTE — Consult Note (Addendum)
WOC Nurse wound follow up Pt allowed me to apply his home Vac machine and dressing to prepare for possible discharge.  Left ischium chronic Stage 4 pressure injury; 4X3X3cm, 100% red with bone palpable, deeper narrow tunneling area in the center, mod amt tan drainage, no odor.  Applied one piece black foam to cont suction with barrier ring around the edge to attempt to maintain a seal. Pt denies offer to bridge track pad away from wound.  Right ischium chronic Stage 3 pressure injury; 2X.2X.2cm, red and dry with scar tissue surrounding, small amt tan drainage. Denies pain during dressing change. Current disposition is unknown. Refer to previous consult notes for further information. WOC team will plan to change dressing on Mon if he is still in the hospital at that time.  Thank-you,  Cammie Mcgee MSN, RN, CWOCN, [place], CNS [phone]

## 2022-10-27 NOTE — TOC Progression Note (Addendum)
Transition of Care [facility]) - Progression Note    Patient Details  Name: [name] MRN: [id number] Date of Birth: [birth year]  Transition of Care [facility]) CM/SW Contact  Halford Chessman Phone Number: 10/27/2022, 4:38 PM  Clinical Narrative:     This Clinical research associate tried to coordinate [facility] for patient, however [facility] were not able to accept patient.  TOC CSW contacted all the home health agencies and they are not able to accept patient for [facility].  Per chart review during previous hospitalization TOC team members were not able to find [facility] services either.  Patient reported his fiancee's aunt is a Engineer, civil (consulting) and has been trained on wound vac changes during previous hospitalization by [employer] wound vac company.  This Clinical research associate, [facility] director Gretta Cool, and attending physician Dr. Renford Dills, went into room reviewed and discussed everything that has been done to try to get services set up for patient.  The team discussed with patient PCS services and patient stated he was not interested.   Per patient he has been dealing with his wounds for over two and half years and taking care of things himself.  TOC team provided resources on his AVS for housing, food insecurity, and transportation issues.    Notified by bedside nurse that patient wheeled himself out after discharge order was written.  Patient plans to follow up with wound care clinic upon discharge and stay at the [facility] at [address], [place], [place].  Patient will receive assistance from his fiancee's aunt, and he is scheduling his wound care clinic appointments.  Expected Discharge Plan: Acute to Acute Transfer Barriers to Discharge: Continued Medical Work up  Expected Discharge Plan and Services Expected Discharge Plan: Acute to Acute Transfer       Living arrangements for the past 2 months: Homeless Expected Discharge Date: 10/27/22                                     Social Determinants of Health (SDOH)  Interventions Food Insecurity Interventions: Other (Comment), Inpatient TOC (Resources added to AVS) Housing Interventions: Inpatient TOC, Other (Comment) (Resources added to AVS) Transportation Interventions: Other (Comment), Inpatient TOC (resources added to AVS)  Readmission Risk Interventions    10/15/2020    3:33 PM  Readmission Risk Prevention Plan  Transportation Screening Complete  PCP or Specialist Appt within 5-7 Days Complete  Home Care Screening Complete  Medication Review (RN CM) Complete

## 2022-10-27 NOTE — Consult Note (Addendum)
WOC Nurse wound follow up Refer to previous consult note on 11/29.  Pt has home Vac machine and charger and dressing change supplies in the room this morning. Plugged machine into charger to prepare for possible discharge today. Offered to apply Vac to left ischium now as patient had previously requested for Friday. He wants to "wait a little longer to see if I get placement." I will return at 12:00 as agreed upon by the patient, in order to give him time to discuss the plan of care with the caseworker and primary physician. Informed patient I need to apply the Vac no later then 2:00 today, since I will not be working later in the afternoon, and there are no WOC nurses available on the weekend.  Thank-you,  Cammie Mcgee MSN, RN, CWOCN, [place], CNS [phone]
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
## 2022-10-27 NOTE — TOC Progression Note (Addendum)
Transition of Care [facility]) - Progression Note    Patient Details  Name: [name] MRN: [id number] Date of Birth: 03/13/1991  Transition of Care [facility]) CM/SW Contact  Liliana Cline, LCSW Phone Number: 10/27/2022, 10:01 AM  Clinical Narrative:    Blanche East at [facility] who stated the DON reviewed referral and they are unable to accept patient.   11:10- Patient asked MD about getting spinal rehab at [facility]. CSW called [facility], left VM for Admissions line requesting return call about this.   11:40- Call from [place] [facility] confirms they do have spinal rehab available and accept Medicaid. [facility] stated they do not have a bed for at least 1-2 weeks. [facility] Grist stated they would not get back to [facility] about referral until middle of next week with response. [facility] Grist stated patient would need a stable DC plan for after rehab in order for them to accept. Team and Supervisor updated.   Expected Discharge Plan: Acute to Acute Transfer Barriers to Discharge: Continued Medical Work up  Expected Discharge Plan and Services Expected Discharge Plan: Acute to Acute Transfer       Living arrangements for the past 2 months: Homeless                                       Social Determinants of Health (SDOH) Interventions Food Insecurity Interventions: Other (Comment), Inpatient TOC (Resources added to AVS) Housing Interventions: Inpatient TOC, Other (Comment) (Resources added to AVS) Transportation Interventions: Other (Comment), Inpatient TOC (resources added to AVS)  Readmission Risk Interventions    10/15/2020    3:33 PM  Readmission Risk Prevention Plan  Transportation Screening Complete  PCP or Specialist Appt within 5-7 Days Complete  Home Care Screening Complete  Medication Review (RN CM) Complete

## 2022-10-27 NOTE — Consult Note (Signed)
WOC Nurse Consult Note: Attempted to apply Vac dressing and patient stated "no one has told me anything and you cannot put that on in case they find placement for me."  Discussed with patient that case manager had a discussion earlier today and he denies anyone talking to him about disposition plans.  I will attempt to apply Vac again at 2:00 and if he refuses at that time, then the WOC team will be unable to apply the dressing today or this weekend.  Thank-you,  Cammie Mcgee MSN, RN, CWOCN, [place], CNS [phone]

## 2022-11-03 ENCOUNTER — Encounter: Payer: Medicaid Other | Attending: Physician Assistant | Admitting: Physician Assistant

## 2022-11-03 DIAGNOSIS — E440 Moderate protein-calorie malnutrition: Secondary | ICD-10-CM | POA: Insufficient documentation

## 2022-11-03 DIAGNOSIS — G8221 Paraplegia, complete: Secondary | ICD-10-CM | POA: Insufficient documentation

## 2022-11-03 DIAGNOSIS — L89313 Pressure ulcer of right buttock, stage 3: Secondary | ICD-10-CM | POA: Insufficient documentation

## 2022-11-03 DIAGNOSIS — L89324 Pressure ulcer of left buttock, stage 4: Secondary | ICD-10-CM | POA: Diagnosis not present

## 2022-11-03 NOTE — Progress Notes (Signed)
ROY, TOKARZ ([id number]) [phone].pdf Page 1 of 6 Visit Report for 11/03/2022 Chief Complaint Document Details Patient Name: Date of Service: [name], [name] 11/03/2022 8:15 [name] M Medical Record Number: [id number] Patient Account Number: [id number] Date of Birth/Sex: Treating RN: 12/06/1990 (31 y.o. 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 11/03/2022 8:15 [name] M Medical Record Number: [id number] Patient Account Number: [id number] Date of Birth/Sex: Treating RN: 1991/02/24 (31 y.o. Roel Cluck Primary Care Provider: PA SSMO RE, TEWA NA Other Clinician: Referring Provider: Treating Provider/Extender: [name] PA SSMO RE, TEWA NA Weeks in Treatment: 3 History of Present Illness HPI Description: 10-13-2022 upon evaluation today patient presents for initial evaluation here in this clinic although this is [name] individual who may have seen earlier in the year even in the [place] clinic. He has been [name] long-term patient of mine. With that being said he unfortunately has [name] wound which is still open and he tells me that this started somewhere to get worse around the August 1 timeframe. Although these are wounds that he has had coming and going for quite some time. Fortunately there does not appear to be any systemic infection at this point though he was recently in the  hospital due to this. He was placed on 7 days of Augmentin and Flagyl although I think he may need this little bit longer he still having some discomfort in the wound itself. With that being said he has been using [name] wound VAC he has had [name] lot of drainage so it he tells me that girlfriend I believe is the one who is changing the wound VAC she was taught how to do this when he was in the hospital. With that being said I do think that we are on the right track as far as that is concerned specially with the depth of the wound present fortunately there is no direct bone exposure though this definitely extends to muscle is definitely [name] stage IV pressure ulcer. He does tell me that he has been sitting about 8 hours [name] day I did explain that he definitely needs to be in the bed more often and rotating frequently from side to side. I do not believe that he has any signs of sepsis at this point which is good news. Patient does have [name] history of being [name] paraplegic he also is moderate protein calorie malnourished and that he is supposed to be increasing protein based on review of his notes from the hospital. 11-03-2022 upon evaluation today patient appears to be doing poorly still in regard to his wound. The main wound is still open and does appear to have some increased depth compared to last time I saw him. He is done with all the antibiotics at this point he was in the hospital where he was given IV antibiotics and CHARLE, CLEAR ([id number]) [phone].pdf Page 2 of 6 oral for the time. He was discharged on multiple supplements but no antibiotics at this point as they felt like that was no  longer necessary. Overall I am very pleased with where things stand currently in that regard. Nonetheless I am not pleased with the fact that the wound actually appears to be worse and he has an opening the right ischial location to which again was not open last time. Electronic  Signature(s) Signed: 11/03/2022 9:33:05 AM By: Lenda KelpStone III, Lillith Mcneff PA-C Entered By: Lenda KelpStone III, Antion Andres on 11/03/2022 09:33:05 -------------------------------------------------------------------------------- Physical Exam Details Patient Name: Date of Service: [name], [name] NTO NIO [name]. 11/03/2022 8:15 [name] M Medical Record Number: [id number] Patient Account Number: [id number] Date of Birth/Sex: Treating RN: 04/14/1991 (31 y.o. Roel CluckM) Smith, Vicki Primary Care Provider: PA SSMO RE, TEWA NA Other Clinician: Referring Provider: Treating Provider/Extender: Allen DerryStone, Cela Newcom PA SSMO RE, TEWA NA Weeks in Treatment: 3 Constitutional Well-nourished and well-hydrated in no acute distress. Respiratory normal breathing without difficulty. Psychiatric this patient is able to make decisions and demonstrates good insight into disease process. Alert and Oriented x 3. pleasant and cooperative. Notes Upon inspection patient's wounds again are showing signs of not being infected at the moment which is good news. I do think the hospital stay was beneficial in that regard. He does not have his wound VAC with him today and based on the fact that he was recently exhibiting signs of infection I would like for him to keep the wound VAC off for [name] little bit of time here. He is in agreement with that plan. We can actually use Dakin moistened gauze dressing for the open wound that is primary here and that way we can pack this appropriately. That should also help cut back on the bacteria. In regard to the right ischial wound will go ahead and utilize silver alginate for that area as well as for protection over the left hip location. Electronic Signature(s) Signed: 11/03/2022 9:33:46 AM By: Lenda KelpStone III, Vonne Mcdanel PA-C Entered By: Lenda KelpStone III, Nyshawn Gowdy on 11/03/2022 09:33:46 -------------------------------------------------------------------------------- Physician Orders Details Patient Name: Date of Service: [name], [name] NTO NIO [name]. 11/03/2022 8:15 [name]  M Medical Record Number: [id number] Patient Account Number: [id number] Date of Birth/Sex: Treating RN: 03/20/1991 (31 y.o. Roel CluckM) Smith, Vicki Primary Care Provider: PA SSMO RE, [place] Other Clinician: Referring Provider: Treating Provider/Extender: Allen DerryStone, Torrian Canion PA SSMO RE, TEWA NA Weeks in Treatment: 3 Verbal / Phone Orders: No [name], [name] ([id number]) [id number] Page 3 of 6 Diagnosis Coding ICD-10 Coding Code Description L89.324 Pressure ulcer of left buttock, stage 4 G82.21 Paraplegia, complete E44.0 Moderate protein-calorie malnutrition Follow-up Appointments Return Appointment in 1 week. Bathing/ Shower/ Hygiene Clean wound with Normal Saline or wound cleanser. Negative Pressure Wound Therapy Wound VAC settings at 125mmHg continuous pressure. Use foam to wound cavity. Please order WHITE foam to fill any tunnel/s and/or undermining when necessary. Change VAC dressing 2 X WEEK. Change canister as indicated when full. - black foam in base of wound, bridge over to hip. [facility]. Wound Treatment Wound #1 - Ischium Wound Laterality: Left Prim Dressing: Gauze ary Discharge Instructions: As directed: moistened with Dakins Solution Secondary Dressing: (BORDER) Zetuvit Plus SILICONE BORDER Dressing 5x5 (in/in) Discharge Instructions: Please do not put silicone bordered dressings under wraps. Use non-bordered dressing only. Wound #2 - Gluteus Wound Laterality: Left Prim Dressing: Silvercel Small 2x2 (in/in) ary Discharge Instructions: Apply Silvercel Small 2x2 (in/in) as instructed Secondary Dressing: (BORDER) Zetuvit Plus SILICONE BORDER Dressing 5x5 (in/in) Discharge Instructions: Please do not put silicone bordered dressings under wraps. Use non-bordered dressing only. Patient Medications llergies:  ciprofloxacin [name] Notifications Medication Indication Start End 11/03/2022 Dakin's Solution DOSE  miscellaneous 0.25 % solution - Moisten gauze with Dakin's solution then wring out leaving gauze damp not saturated before packing daily into the wound as directed in clini Electronic Signature(s) Signed: 11/03/2022 9:35:55 AM By: Lenda Kelp PA-C Entered By: Lenda Kelp on 11/03/2022 09:35:54 -------------------------------------------------------------------------------- Problem List Details Patient Name: Date of Service: [name] March [name]. 11/03/2022 8:15 [name] M Medical Record Number: [id number] Patient Account Number: [id number] Date of Birth/Sex: Treating RN: 1991/10/30 (31 y.o. 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 11/03/2022 8:15 [name] M Medical Record Number: [id number] Patient Account Number: [id number] Date of Birth/Sex: Treating RN: 1991-10-01 (31 y.o. 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 He has been [name] long-term patient of mine. With that being said he unfortunately has [name] wound which is still open and he tells me that this started somewhere to get worse around the August 1 timeframe. Although these are wounds that he has had coming and going for quite some time. Fortunately there does not appear to be any systemic infection at this point though he was recently in the hospital due to this. He was placed on 7 days of Augmentin and Flagyl although I think he may need this little bit longer he still having some discomfort in the wound itself. With that being said he has been using [name] wound VAC he has had [name] lot of drainage so it he tells me that girlfriend I believe is the one who is changing the wound VAC she was taught how to do this when he was in the hospital. With that being said I do think that we are on the right track as far as that is concerned specially with the depth of the wound present fortunately there is no direct bone exposure though this definitely extends to muscle is definitely [name] stage IV pressure ulcer. He does tell me that he has been sitting about 8 hours [name] day I did explain that he definitely needs to be in the bed more often and rotating frequently from side to side. I do not believe that he has any signs of sepsis at this point which is good news. Patient does have [name] history of being [name] paraplegic he also is moderate protein calorie malnourished and that he is  supposed to be increasing protein based on review of his notes from the hospital. 11-03-2022 upon evaluation today patient appears to be doing poorly still in regard to his wound. The  main wound is still open and does appear to have some increased depth compared to last time I saw him. He is done with all the antibiotics at this point he was in the hospital where he was given IV antibiotics and oral for the time. He was discharged on multiple supplements but no antibiotics at this point as they felt like that was no longer necessary. Overall I am very pleased with where things stand currently in that regard. Nonetheless I am not pleased with the fact that the wound actually appears to be worse and he has an opening the right ischial location to which again was not open last time. [name], [name] ([id number]) [phone].pdf Page 5 of 6 Objective Constitutional Well-nourished and well-hydrated in no acute distress. Vitals Time Taken: 8:18 AM, Height: 72 in, Weight: 135 lbs, BMI: 18.3, Temperature: 98.2 F, Pulse: 84 bpm, Respiratory Rate: 16 breaths/min, Blood Pressure: 135/81 mmHg. Respiratory normal breathing without difficulty. Psychiatric this patient is able to make decisions and demonstrates good insight into disease process. Alert and Oriented x 3. pleasant and cooperative. General Notes: Upon inspection patient's wounds again are showing signs of not being infected at the moment which is good news. I do think the hospital stay was beneficial in that regard. He does not have his wound VAC with him today and based on the fact that he was recently exhibiting signs of infection I would like for him to keep the wound VAC off for [name] little bit of time here. He is in agreement with that plan. We can actually use Dakin moistened gauze dressing for the open wound that is primary here and that way we can pack this appropriately. That should also help cut back on the bacteria. In regard to the right ischial wound will go ahead and utilize silver alginate for that area as well as for protection over the left hip location. Integumentary (Hair, Skin) Wound  #1 status is Open. Original cause of wound was Pressure Injury. The date acquired was: 06/27/2022. The wound has been in treatment 3 weeks. The wound is located on the Left Ischium. The wound measures 4.3cm length x 2.5cm width x 1.5cm depth; 8.443cm^2 area and 12.665cm^3 volume. There is muscle and Fat Layer (Subcutaneous Tissue) exposed. There is [name] large amount of serous drainage noted. There is large (67-100%) red, hyper - granulation within the wound bed. There is [name] small (1-33%) amount of necrotic tissue within the wound bed including Adherent Slough. Wound #2 status is Open. Original cause of wound was Pressure Injury. The date acquired was: 10/29/2022. The wound is located on the Left Gluteus. The wound measures 3.2cm length x 0.5cm width x 0.2cm depth; 1.257cm^2 area and 0.251cm^3 volume. There is Fat Layer (Subcutaneous Tissue) exposed. There is no tunneling or undermining noted. There is [name] small amount of serous drainage noted. There is medium (34-66%) pink granulation within the wound bed. There is no necrotic tissue within the wound bed. Assessment Active Problems ICD-10 Pressure ulcer of left buttock, stage 4 Pressure ulcer of right buttock, stage 3 Paraplegia, complete Moderate protein-calorie malnutrition Plan Follow-up Appointments: Return Appointment in 1 week. Bathing/ Shower/ Hygiene: Clean wound with Normal Saline or wound cleanser. Negative Pressure Wound Therapy: Wound VAC settings at continuous pressure. Use foam to wound cavity. Please order  WHITE foam to fill any tunnel/s and/or undermining when necessary. Change VAC dressing 2 X WEEK. Change canister as indicated when full. - black foam in base of wound, bridge over to hip. At home, Patient's [facility] had been trained to change to wound vac. The following medication(s) was prescribed: Dakin's Solution miscellaneous 0.25 % solution Moisten gauze with Dakin's solution then wring out leaving gauze damp not saturated  before packing daily into the wound as directed in clini starting 11/03/2022 WOUND #1: - Ischium Wound Laterality: Left Prim Dressing: Gauze ary Discharge Instructions: As directed: moistened with Dakins Solution Secondary Dressing: (BORDER) Zetuvit Plus SILICONE BORDER Dressing 5x5 (in/in) Discharge Instructions: Please do not put silicone bordered dressings under wraps. Use non-bordered dressing only. WOUND #2: - Gluteus Wound Laterality: Left Prim Dressing: Silvercel Small 2x2 (in/in) ary Discharge Instructions: Apply Silvercel Small 2x2 (in/in) as instructed Secondary Dressing: (BORDER) Zetuvit Plus SILICONE BORDER Dressing 5x5 (in/in) Discharge Instructions: Please do not put silicone bordered dressings under wraps. Use non-bordered dressing only. 1. I am good recommend that we initiate treatment with the Dakin's moistened gauze dressing which I think is probably going to be the best way to go. The ISSAAC, SHIPPER ([id number]) [phone].pdf Page 6 of 6 patient is in agreement with that plan. 2. I am also can recommend that the patient use [name] silver alginate dressing for the right ischial location as well as protection over the left hip. 3. I am also can recommend he should continue with appropriate offloading. This is of utmost importance and I discussed that with him again today. We will see patient back for reevaluation in 1 week here in the clinic. If anything worsens or changes patient will contact our office for additional recommendations. Electronic Signature(s) Signed: 11/03/2022 9:36:04 AM By: Lenda Kelp PA-C Entered By: Lenda Kelp on 11/03/2022 09:36:04 -------------------------------------------------------------------------------- SuperBill Details Patient Name: Date of Service: [name], [name] 11/03/2022 Medical Record Number: [id number] Patient Account Number: [id number] Date of Birth/Sex: Treating RN: June 11, 1991 (31 y.o. Roel Cluck Primary Care Provider: PA SSMO RE, TEWA NA Other Clinician: Referring Provider: Treating Provider/Extender: [name] PA SSMO RE, TEWA NA Weeks in Treatment: 3 Diagnosis Coding ICD-10 Codes Code Description [phone] Pressure ulcer of left buttock, stage 4 L89.313 Pressure ulcer of right buttock, stage 3 G82.21 Paraplegia, complete E44.0 Moderate protein-calorie malnutrition Facility Procedures : CPT4 Code: [id number] Description: 99213 - WOUND CARE VISIT-LEV 3 EST PT Modifier: Quantity: 1 Physician Procedures : CPT4 Code Description Modifier [id number] - WC PHYS LEVEL 4 - EST PT ICD-10 Diagnosis Description L89.324 Pressure ulcer of left buttock, stage 4 L89.313 Pressure ulcer of right buttock, stage 3 G82.21 Paraplegia, complete E44.0 Moderate  protein-calorie malnutrition Quantity: 1 Electronic Signature(s) Signed: 11/03/2022 9:36:19 AM By: Lenda Kelp PA-C Entered By: Lenda Kelp on 11/03/2022 09:36:18

## 2022-11-04 NOTE — Progress Notes (Addendum)
YIDEL, [name] ([id number]) [phone].pdf Page 1 of 9 Visit Report for 11/03/2022 Arrival Information Details Patient Name: Date of Service: [name], [name] 11/03/2022 8:15 [name] M Medical Record Number: [id number] Patient Account Number: [id number] Date of Birth/Sex: Treating RN: June 25, 1991 (31 y.o. Roel Cluck Primary Care Janda Cargo: PA SSMO RE, TEWA [place] Other Clinician: Referring Denaly Gatling: Treating Raheim Beutler/Extender: [name] PA SSMO RE, TEWA NA Weeks in Treatment: 3 Visit Information History Since Last Visit All ordered tests and consults were completed: No Patient Arrived: Wheel Chair Added or deleted any medications: No Arrival Time: 08:17 Any new allergies or adverse reactions: No Accompanied By: self Had [name] fall or experienced change in No Transfer Assistance: None activities of daily living that may affect Patient Identification Verified: Yes risk of falls: Secondary Verification Process Completed: Yes Signs or symptoms of abuse/neglect since last visito No Patient Requires Transmission-Based Precautions: No Hospitalized since last visit: No Patient Has Alerts: Yes Implantable device outside of the clinic excluding No Patient Alerts: NOT diabetic cellular tissue based products placed in the center since last visit: Pain Present Now: No Electronic Signature(s) Signed: 11/03/2022 1:42:10 PM By: Midge Aver MSN RN CNS WTA Entered By: Midge Aver on 11/03/2022 08:20:32 -------------------------------------------------------------------------------- Clinic Level of Care Assessment Details Patient Name: Date of Service: [name], [name] L. 11/03/2022 8:15 [name] M Medical Record Number: [id number] Patient Account Number: [id number] Date of Birth/Sex: Treating RN: [birth year] (31 y.o. Roel Cluck Primary Care Minerva Bluett: PA SSMO RE, TEWA NA Other Clinician: Referring Yeray Tomas: Treating Kathlynn Swofford/Extender: [name] PA SSMO RE, TEWA NA Weeks in  Treatment: 3 Clinic Level of Care Assessment Items TOOL 4 Quantity Score X- 1 0 Use when only an EandM is performed on FOLLOW-UP visit ASSESSMENTS - Nursing Assessment / Reassessment X- 1 10 Reassessment of Co-morbidities (includes updates in patient status) X- 1 5 Reassessment of Adherence to Treatment Plan [name], [name] L ([id number]) [id number] Page 2 of 9 ASSESSMENTS - Wound and Skin [name] ssessment / Reassessment []  - Simple Wound Assessment / Reassessment - one wound 0 X- 2 5 Complex Wound Assessment / Reassessment - multiple wounds []  - 0 Dermatologic / Skin Assessment (not related to wound area) ASSESSMENTS - Focused Assessment []  - 0 Circumferential Edema Measurements - multi extremities []  - 0 Nutritional Assessment / Counseling / Intervention []  - 0 Lower Extremity Assessment (monofilament, tuning fork, pulses) []  - 0 Peripheral Arterial Disease Assessment (using hand held doppler) ASSESSMENTS - Ostomy and/or Continence Assessment and Care []  - 0 Incontinence Assessment and Management []  - 0 Ostomy Care Assessment and Management (repouching, etc.) PROCESS - Coordination of Care []  - 0 Simple Patient / Family Education for ongoing care []  - 0 Complex (extensive) Patient / Family Education for ongoing care X- 1 10 Staff obtains , Records, T Results / Process Orders est []  - 0 Staff telephones HHA, Nursing Homes / Clarify orders / etc []  - 0 Routine Transfer to another Facility (non-emergent condition) []  - 0 Routine Hospital Admission (non-emergent condition) []  - 0 New Admissions / / Ordering NPWT Apligraf, etc. , []  - 0 Emergency Hospital Admission (emergent condition) []  - 0 Simple Discharge Coordination X- 1 15 Complex (extensive) Discharge Coordination PROCESS - Special Needs []  - 0 Pediatric / Minor Patient Management []  - 0 Isolation Patient Management []  - 0 Hearing / Language /  Visual special needs []  - 0 Assessment of Community assistance (transportation, D/C planning, etc.) []  - 0 Additional assistance / Altered  mentation []  - 0 Support Surface(s) Assessment (bed, cushion, seat, etc.) INTERVENTIONS - Wound Cleansing / Measurement []  - 0 Simple Wound Cleansing - one wound X- 2 5 Complex Wound Cleansing - multiple wounds []  - 0 Wound Imaging (photographs - any number of wounds) X- 1 5 Wound Tracing (instead of photographs) []  - 0 Simple Wound Measurement - one wound X- 2 5 Complex Wound Measurement - multiple wounds INTERVENTIONS - Wound Dressings []  - 0 Small Wound Dressing one or multiple wounds X- 2 15 Medium Wound Dressing one or multiple wounds []  - 0 Large Wound Dressing one or multiple wounds []  - 0 Application of Medications - topical []  - 0 Application of Medications - injection INTERVENTIONS - Miscellaneous []  - 0 External ear exam Machamer, Bascom L (  .pdf Page 3 of 9 []  - 0 Specimen Collection (cultures, biopsies, blood, body fluids, etc.) []  - 0 Specimen(s) / Culture(s) sent or taken to Lab for analysis []  - 0 Patient Transfer (multiple staff / Lift / Similar devices) []  - 0 Simple Staple / Suture removal (25 or less) []  - 0 Complex Staple / Suture removal (26 or more) []  - 0 Hypo / Hyperglycemic Management (close monitor of Blood Glucose) []  - 0 Ankle / Brachial Index (ABI) - do not check if billed separately X- 1 5 Vital Signs Has the patient been seen at the hospital within the last three years: Yes Total Score: 110 Level Of Care: New/Established - Level 3 Electronic Signature(s) Signed: 11/03/2022 1:42:10 PM By: MSN RN CNS WTA Entered By: on 11/03/2022 09:09:26 -------------------------------------------------------------------------------- Encounter Discharge Information Details Patient Name: Date of Service: L. 11/03/2022  8:15 [name] M Medical Record Number: [id number] Patient Account Number: [id number] Date of Birth/Sex: Treating RN: November 16, 1991 (31 y.o. Primary Care Korynn Kenedy: PA SSMO RE, TEWA NA Other Clinician: Referring Eastyn Dattilo: Treating Jaelle Campanile/Extender: PA SSMO RE, TEWA NA Weeks in Treatment: 3 Encounter Discharge Information Items Discharge Condition: Stable Ambulatory Status: Wheelchair Discharge Destination: Home Transportation: Other Accompanied By: self Schedule Follow-up Appointment: Yes Clinical Summary of Care: Electronic Signature(s) Signed: 11/03/2022 1:42:10 PM By: MSN RN CNS WTA Entered By: on 11/03/2022 09:10:35 -------------------------------------------------------------------------------- Lower Extremity Assessment Details Patient Name: Date of Service: [name], [name] 11/03/2022 8:15 [name] ARISTOTELIS VILARDI, Lamarco L (Midge Aver14/06/2022.pdf Page 4 of 9 Medical Record Number: Maurice March Patient Account Number: 14/06/2022 Date of Birth/Sex: Treating RN: [birth year] (31 y.o. [id number] Primary Care Kili Gracy: PA SSMO RE, Lynn Roel Cluck Other Clinician: Referring Emillie Chasen: Treating Adonai Selsor/Extender: [name] PA SSMO RE, TEWA NA Weeks in Treatment: 3 Electronic Signature(s) Signed: 11/03/2022 1:42:10 PM By: Midge Aver MSN RN CNS WTA Entered By: Midge Aver on 11/03/2022 08:33:54 -------------------------------------------------------------------------------- Multi Wound Chart Details Patient Name: Date of Service: [name] Crocker L. 11/03/2022 8:15 [name] M Medical Record Number: Francie Massing Patient Account Number: [id number] Date of Birth/Sex: Treating RN: [birth year] (31 y.o. [id number] Primary Care Taijon Vink: PA SSMO RE, TEWA NA Other Clinician: Referring Cerita Rabelo: Treating Versa Craton/Extender: [phone] PA SSMO RE, TEWA NA Weeks in Treatment: 3 Vital Signs Height(in): 72 Pulse(bpm): 84 Weight(lbs):  135 Blood Pressure(mmHg): 135/81 Body Mass Index(BMI): 18.3 Temperature(F): 98.2 Respiratory Rate(breaths/min): 16 [1:Photos:] [N/[name]:N/[name]] Left Ischium Left Gluteus N/[name] Wound Location: Pressure Injury Pressure Injury N/[name] Wounding Event: Pressure Ulcer Pressure Ulcer N/[name] Primary Etiology: Paraplegia Paraplegia N/[name] Comorbid History: 06/27/2022 10/29/2022 N/[name] Date Acquired: 3 0 N/[name] Weeks of Treatment:  Open Open N/[name] Wound Status: No No N/[name] Wound Recurrence: 4.3x2.5x1.5 3.2x0.5x0.2 N/[name] Measurements L x W x D (cm) 8.443 1.257 N/[name] [name] (cm) : rea 12.665 0.251 N/[name] Volume (cm) : 48.80% N/[name] N/[name] % Reduction in [name] rea: 69.30% N/[name] N/[name] % Reduction in Volume: Category/Stage IV Category/Stage II N/[name] Classification: Large Small N/[name] Exudate [name] mount: Serous Serous N/[name] Exudate Type: amber amber N/[name] Exudate Color: Large (67-100%) Medium (34-66%) N/[name] Granulation [name] mount: Red, Hyper-granulation Pink N/[name] Granulation Quality: Small (1-33%) None Present (0%) N/[name] Necrotic [name] mount: Fat Layer (Subcutaneous Tissue): Yes Fat Layer (Subcutaneous Tissue): Yes N/[name] Exposed Structures: Muscle: Yes Fascia: No Fascia: No Tendon: No Tendon: No Muscle: No Joint: No Joint: No Bone: No Bone: No Cowell, Allenmichael L ([id number]) [id number] Page 5 of 9 Small (1-33%) Medium (34-66%) N/[name] Epithelialization: Treatment Notes Electronic Signature(s) Signed: 11/03/2022 1:42:10 PM By: Midge AverSmith, Vicki MSN RN CNS WTA Entered By: Midge AverSmith, Vicki on 11/03/2022 09:06:41 -------------------------------------------------------------------------------- Multi-Disciplinary Care Plan Details Patient Name: Date of Service: [name], [name] 11/03/2022 8:15 [name] M Medical Record Number: [id number] Patient Account Number: [id number] Date of Birth/Sex: Treating RN: 12/19/1990 (31 y.o. Roel CluckM) Smith, Vicki Primary Care Josey Forcier: PA SSMO RE, TEWA NA Other Clinician: Referring Kyzer Blowe: Treating  Ruthvik Barnaby/Extender: Allen DerryStone, Hoyt PA SSMO RE, TEWA NA Weeks in Treatment: 3 Active Inactive Electronic Signature(s) Signed: 12/04/2022 12:38:53 PM By: Elliot GurneyWoody, BSN, RN, CWS, Kim RN, BSN Signed: 12/27/2022 4:51:59 PM By: Midge AverSmith, Vicki MSN RN CNS WTA Previous Signature: 11/03/2022 1:42:10 PM Version By: Midge AverSmith, Vicki MSN RN CNS WTA Entered By: Elliot GurneyWoody, BSN, RN, CWS, Kim on 12/04/2022 12:38:53 -------------------------------------------------------------------------------- Pain Assessment Details Patient Name: Date of Service: [name], [name] 11/03/2022 8:15 [name] M Medical Record Number: [id number] Patient Account Number: [id number] Date of Birth/Sex: Treating RN: 07/24/1991 (31 y.o. Roel CluckM) Smith, Vicki Primary Care Semiyah Newgent: PA SSMO RE, TEWA [place] Other Clinician: Referring Kearstyn Avitia: Treating Zaydn Gutridge/Extender: Allen DerryStone, Hoyt PA SSMO RE, TEWA NA Weeks in Treatment: 3 Active Problems Location of Pain Severity and Description of Pain Patient Has Paino No Site Locations CarletonRUSSELL, FletcherANTONIO L ([id number]) [id number] Page 6 of 9 Pain Management and Medication Current Pain Management: Electronic Signature(s) Signed: 11/03/2022 1:42:10 PM By: Midge AverSmith, Vicki MSN RN CNS WTA Entered By: Midge AverSmith, Vicki on 11/03/2022 08:20:40 -------------------------------------------------------------------------------- Patient/Caregiver Education Details Patient Name: Date of Service: [name] RegulusUSSELL, [name] 12/8/2023andnbsp8:15 [name] M Medical Record Number: [id number] Patient Account Number: [id number] Date of Birth/Gender: Treating RN: 01/31/1991 (31 y.o. Roel CluckM) Smith, Vicki Primary Care Physician: PA SSMO RE, TEWA NA Other Clinician: Referring Physician: Treating Physician/Extender: Allen DerryStone, Hoyt PA SSMO RE, TEWA NA Weeks in Treatment: 3 Education Assessment Education Provided To: Patient Education Topics Provided Wound/Skin Impairment: Handouts: Caring for Your Ulcer Methods: Explain/Verbal Responses: State  content correctly Electronic Signature(s) Signed: 11/03/2022 1:42:10 PM By: Midge AverSmith, Vicki MSN RN CNS WTA Entered By: Midge AverSmith, Vicki on 11/03/2022 09:09:46 Bouchard, Joselyn ArrowANTONIO L ([id number]) [id number] Page 7 of 9 -------------------------------------------------------------------------------- Wound Assessment Details Patient Name: Date of Service: [name], [name] 11/03/2022 8:15 [name] M Medical Record Number: [id number] Patient Account Number: [id number] Date of Birth/Sex: Treating RN: 07/05/1991 (31 y.o. Roel CluckM) Smith, Vicki Primary Care Consuelo Thayne: PA SSMO RE, TEWA NA Other Clinician: Referring Teya Otterson: Treating Jamekia Gannett/Extender: Allen DerryStone, Hoyt PA SSMO RE, TEWA NA Weeks in Treatment: 3 Wound Status Wound Number: 1 Primary Etiology: Pressure Ulcer Wound Location: Left Ischium Wound Status: Open Wounding Event: Pressure Injury Comorbid History: Paraplegia Date Acquired: 06/27/2022 Weeks Of Treatment: 3 Clustered Wound: No Photos  Wound Measurements Length: (cm) 4.3 Width: (cm) 2.5 Depth: (cm) 1.5 Area: (cm) 8.443 Volume: (cm) 12.665 % Reduction in Area: 48.8% % Reduction in Volume: 69.3% Epithelialization: Small (1-33%) Wound Description Classification: Category/Stage IV Exudate Amount: Large Exudate Type: Serous Exudate Color: amber Foul Odor After Cleansing: No Slough/Fibrino Yes Wound Bed Granulation Amount: Large (67-100%) Exposed Structure Granulation Quality: Red, Hyper-granulation Fascia Exposed: No Necrotic Amount: Small (1-33%) Fat Layer (Subcutaneous Tissue) Exposed: Yes Necrotic Quality: Adherent Slough Tendon Exposed: No Muscle Exposed: Yes Necrosis of Muscle: No Joint Exposed: No Bone Exposed: No Electronic Signature(s) Signed: 11/03/2022 1:42:10 PM By: Midge Aver MSN RN CNS WTA Entered By: Midge Aver on 11/03/2022 08:33:22 Simoni, Joselyn Arrow ([id number]) [id number] Page 8 of  9 -------------------------------------------------------------------------------- Wound Assessment Details Patient Name: Date of Service: [name], ALFREY 11/03/2022 8:15 [name] M Medical Record Number: [id number] Patient Account Number: [id number] Date of Birth/Sex: Treating RN: 1991/09/11 (31 y.o. Roel Cluck Primary Care Maikayla Beggs: PA SSMO RE, TEWA NA Other Clinician: Referring Tonjua Rossetti: Treating Tasman Zapata/Extender: [name] PA SSMO RE, TEWA NA Weeks in Treatment: 3 Wound Status Wound Number: 2 Primary Etiology: Pressure Ulcer Wound Location: Left Gluteus Wound Status: Open Wounding Event: Pressure Injury Comorbid History: Paraplegia Date Acquired: 10/29/2022 Weeks Of Treatment: 0 Clustered Wound: No Photos Wound Measurements Length: (cm) 3.2 Width: (cm) 0.5 Depth: (cm) 0.2 Area: (cm) 1.257 Volume: (cm) 0.251 % Reduction in Area: % Reduction in Volume: Epithelialization: Medium (34-66%) Tunneling: No Undermining: No Wound Description Classification: Category/Stage II Exudate Amount: Small Exudate Type: Serous Exudate Color: amber Foul Odor After Cleansing: No Slough/Fibrino No Wound Bed Granulation Amount: Medium (34-66%) Exposed Structure Granulation Quality: Pink Fascia Exposed: No Necrotic Amount: None Present (0%) Fat Layer (Subcutaneous Tissue) Exposed: Yes Tendon Exposed: No Muscle Exposed: No Joint Exposed: No Bone Exposed: No Electronic Signature(s) Signed: 11/03/2022 1:42:10 PM By: Midge Aver MSN RN CNS WTA Entered By: Midge Aver on 11/03/2022 08:33:45 Peale, Joselyn Arrow ([id number]) [id number] Page 9 of 9 -------------------------------------------------------------------------------- Vitals Details Patient Name: Date of Service: EUGENE, ISADORE 11/03/2022 8:15 [name] M Medical Record Number: [id number] Patient Account Number: [id number] Date of Birth/Sex: Treating RN: March 20, 1991 (31 y.o. Roel Cluck Primary  Care Ell Tiso: PA SSMO RE, TEWA NA Other Clinician: Referring Graciana Sessa: Treating Mickey Hebel/Extender: [name] PA SSMO RE, TEWA NA Weeks in Treatment: 3 Vital Signs Time Taken: 08:18 Temperature (F): 98.2 Height (in): 72 Pulse (bpm): 84 Weight (lbs): 135 Respiratory Rate (breaths/min): 16 Body Mass Index (BMI): 18.3 Blood Pressure (mmHg): 135/81 Reference Range: 80 - 120 mg / dl Electronic Signature(s) Signed: 11/03/2022 1:42:10 PM By: Midge Aver MSN RN CNS WTA Entered By: Midge Aver on 11/03/2022 08:20:35

## 2022-11-07 ENCOUNTER — Other Ambulatory Visit: Payer: Self-pay

## 2022-11-10 ENCOUNTER — Ambulatory Visit: Payer: Medicaid Other | Admitting: Physician Assistant

## 2022-11-17 ENCOUNTER — Ambulatory Visit: Payer: Medicaid Other | Admitting: Physician Assistant

## 2022-11-29 ENCOUNTER — Ambulatory Visit: Payer: Self-pay | Admitting: Nurse Practitioner

## 2022-12-16 ENCOUNTER — Other Ambulatory Visit: Payer: Self-pay

## 2022-12-16 ENCOUNTER — Emergency Department: Payer: Medicaid Other

## 2022-12-16 ENCOUNTER — Emergency Department
Admission: EM | Admit: 2022-12-16 | Discharge: 2022-12-16 | Disposition: A | Discharge status: Home / Self Care | Payer: Medicaid Other | Attending: Student in an Organized Health Care Education/Training Program | Admitting: Student in an Organized Health Care Education/Training Program

## 2022-12-16 ENCOUNTER — Encounter: Payer: Self-pay | Admitting: Emergency Medicine

## 2022-12-16 DIAGNOSIS — M25561 Pain in right knee: Secondary | ICD-10-CM

## 2022-12-16 DIAGNOSIS — M25552 Pain in left hip: Secondary | ICD-10-CM | POA: Diagnosis present

## 2022-12-16 DIAGNOSIS — X509XXA Other and unspecified overexertion or strenuous movements or postures, initial encounter: Secondary | ICD-10-CM | POA: Diagnosis not present

## 2022-12-16 LAB — BASIC METABOLIC PANEL
Anion gap: 6 (ref 5–15)
BUN: 16 mg/dL (ref 6–20)
CO2: 29 mmol/L (ref 22–32)
Calcium: 8.8 mg/dL — ABNORMAL LOW (ref 8.9–10.3)
Chloride: 108 mmol/L (ref 98–111)
Creatinine, Ser: 0.87 mg/dL (ref 0.61–1.24)
GFR, Estimated: >60 mL/min (ref >60)
Glucose, Bld: 149 mg/dL — ABNORMAL HIGH (ref 70–99)
Potassium: 3.6 mmol/L (ref 3.5–5.1)
Sodium: 143 mmol/L (ref 135–145)

## 2022-12-16 LAB — CBC
HCT: 38.9 % — ABNORMAL LOW (ref 39.0–52.0)
Hemoglobin: 11.6 g/dL — ABNORMAL LOW (ref 13.0–17.0)
MCH: 22.2 pg — ABNORMAL LOW (ref 26.0–34.0)
MCHC: 29.8 g/dL — ABNORMAL LOW (ref 30.0–36.0)
MCV: 74.4 fL — ABNORMAL LOW (ref 80.0–100.0)
Platelets: 319 10*3/uL (ref 150–400)
RBC: 5.23 MIL/uL (ref 4.22–5.81)
RDW: 17.9 % — ABNORMAL HIGH (ref 11.5–15.5)
WBC: 4.7 10*3/uL (ref 4.0–10.5)
nRBC: 0 % (ref 0.0–0.2)

## 2022-12-16 LAB — URINALYSIS, ROUTINE W REFLEX MICROSCOPIC
Bacteria, UA: NONE SEEN
Bilirubin Urine: NEGATIVE
Glucose, UA: NEGATIVE mg/dL
Ketones, ur: NEGATIVE mg/dL
Leukocytes,Ua: NEGATIVE
Nitrite: NEGATIVE
Protein, ur: 30 mg/dL — AB
Specific Gravity, Urine: 1.03 (ref 1.005–1.030)
Squamous Epithelial / HPF: NONE SEEN /HPF (ref 0–5)
pH: 5 (ref 5.0–8.0)

## 2022-12-16 LAB — LACTIC ACID, PLASMA: Lactic Acid, Venous: 0.9 mmol/L (ref 0.5–1.9)

## 2022-12-16 NOTE — ED Triage Notes (Signed)
Pt via POV from home. Pt has multiple complaints. Pt states this AM he heard his R knee pop, states that its not moving how it normally moves. Pt is a paraplegic. Pt also states that he is having L hip pain for the past 4 months. Pt states the think his sacral wound is infected. Pt also states he feels like there is a "balloon" in his bladder, unable to describe further. Pt is A&Ox4 and NAD

## 2022-12-16 NOTE — ED Notes (Signed)
Patient is upset and wanted the supervisor of provider seen. Dr. Archie Balboa gave him Dr. Kyra Manges name and patient  said he would call him. Patient declined discharge vital signs. Patient was assisted to his wheelchair. Patient called for a ride and wheeled himself to the lobby.

## 2022-12-16 NOTE — ED Notes (Signed)
Patient was able to transfer to stretcher independently with assist to bring his leg up and was given a warm blanket. Patient states he is here for multiple complaints. Patient states he feels like there is a "bubble or tumor" in his bladder. Patient states urine has an odd odor, but appearance is normal for him.

## 2022-12-16 NOTE — Discharge Instructions (Addendum)
ED Director: Dr. Corky Downs.

## 2022-12-16 NOTE — ED Notes (Signed)
Patient given in and out catheter kit. Patient states he would prefer to do it himself. Betha Loa, PA-C at bedside.
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
## 2022-12-18 ENCOUNTER — Telehealth: Payer: Self-pay

## 2022-12-18 NOTE — Telephone Encounter (Signed)
Transition Care Management Follow-up Telephone Call Date of discharge and from where: 12/16/22 How have you been since you were released from the hospital? Pt  still is feeling bad Any questions or concerns? No  Items Reviewed: Did the pt receive and understand the discharge instructions provided? Yes  Medications obtained and verified? No  Other? No  Any new allergies since your discharge? No  Dietary orders reviewed? No Do you have support at home? No   Follow up appointments reviewed:  PCP Hospital f/u appt confirmed? Yes  Scheduled to see Fola on 12/25/22 @ 2:40 pm. [facility] f/u appt confirmed? No   Are transportation arrangements needed? No If their condition worsens, is the pt aware to call PCP or go to the Emergency Dept.? Yes Was the patient provided with contact information for the PCP's office or ED? Yes Was to pt encouraged to call back with questions or concerns? Yes     Elyse Jarvis RMA

## 2022-12-25 ENCOUNTER — Inpatient Hospital Stay: Payer: Self-pay | Admitting: Nurse Practitioner

## 2022-12-25 ENCOUNTER — Telehealth: Payer: Self-pay

## 2022-12-25 NOTE — Telephone Encounter (Signed)
Transition Care Management Follow-up Telephone Call Date of discharge and from where: 12/23/22 How have you been since you were released from the hospital? Per pt he feels the same Any questions or concerns? No  Items Reviewed: Did the pt receive and understand the discharge instructions provided? Yes  Medications obtained and verified? Yes  Other? No  Any new allergies since your discharge? No  Dietary orders reviewed? No Do you have support at home? No   Follow up appointments reviewed:  PCP Hospital f/u appt confirmed? Yes  Scheduled to see Fola on 01/03/23 @ 10:20 am. [facility] f/u appt confirmed? No   Are transportation arrangements needed? Yes  If their condition worsens, is the pt aware to call PCP or go to the Emergency Dept.? Yes Was the patient provided with contact information for the PCP's office or ED? Yes Was to pt encouraged to call back with questions or concerns? Yes     .kh

## 2023-01-03 ENCOUNTER — Inpatient Hospital Stay: Payer: Self-pay | Admitting: Nurse Practitioner

## 2023-01-09 ENCOUNTER — Inpatient Hospital Stay: Payer: Self-pay | Admitting: Nurse Practitioner

## 2023-01-25 NOTE — Telephone Encounter (Signed)
Error

## 2023-03-16 ENCOUNTER — Encounter (HOSPITAL_COMMUNITY): Payer: Self-pay

## 2023-03-16 ENCOUNTER — Emergency Department (HOSPITAL_COMMUNITY): Payer: Medicaid Other

## 2023-03-16 ENCOUNTER — Emergency Department (HOSPITAL_COMMUNITY)
Admission: EM | Admit: 2023-03-16 | Discharge: 2023-03-16 | Disposition: A | Discharge status: Home / Self Care | Payer: Medicaid Other | Attending: Emergency Medicine | Admitting: Emergency Medicine

## 2023-03-16 ENCOUNTER — Other Ambulatory Visit: Payer: Self-pay

## 2023-03-16 DIAGNOSIS — Z5189 Encounter for other specified aftercare: Secondary | ICD-10-CM

## 2023-03-16 DIAGNOSIS — Z4800 Encounter for change or removal of nonsurgical wound dressing: Secondary | ICD-10-CM | POA: Diagnosis present

## 2023-03-16 NOTE — Discharge Instructions (Addendum)
Follow-up at the wound care center 
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
## 2023-03-16 NOTE — ED Triage Notes (Signed)
Pt to er, pt states that he is here for a pressure ulcer, states that he has had it for the past 8 months, states that he feels like it is going into his hip because it is causing some hip pain.

## 2023-03-19 ENCOUNTER — Telehealth: Payer: Self-pay

## 2023-03-19 NOTE — Transitions of Care (Post Inpatient/ED Visit) (Cosign Needed)
   03/19/2023  Name: [name] MRN: [id number] DOB: Sep 06, 1991  Today's TOC FU Call Status: Today's TOC FU Call Status:: Unsuccessul Call (1st Attempt) Unsuccessful Call (1st Attempt) Date: 03/19/23  Attempted to reach the patient regarding the most recent Inpatient/ED visit.  Follow Up Plan: Additional outreach attempts will be made to reach the patient to complete the Transitions of Care (Post Inpatient/ED visit) call.   Signature Renelda Loma RMA

## 2023-03-20 ENCOUNTER — Telehealth: Payer: Self-pay

## 2023-03-20 ENCOUNTER — Other Ambulatory Visit: Payer: Self-pay | Admitting: Physical Medicine and Rehabilitation

## 2023-03-20 ENCOUNTER — Telehealth: Payer: Self-pay | Admitting: Nurse Practitioner

## 2023-03-20 ENCOUNTER — Ambulatory Visit: Payer: Medicaid Other | Admitting: Nurse Practitioner

## 2023-03-20 NOTE — Telephone Encounter (Signed)
Caller & Relationship to patient:  MRN #  [id number]   Call Back Number:   Date of Last Office Visit: 03/19/2023     Date of Next Office Visit: 03/26/2023    Medication(s) to be Refilled: Muscle spasms med refills  Preferred Pharmacy:   ** Please notify patient to allow 48-72 hours to process** **Let patient know to contact pharmacy at the end of the day to make sure medication is ready. ** **If patient has not been seen in a year or longer, book an appointment **Advise to use MyChart for refill requests OR to contact their pharmacy
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
## 2023-03-21 ENCOUNTER — Telehealth: Payer: Self-pay | Admitting: Physical Medicine and Rehabilitation

## 2023-03-21 ENCOUNTER — Other Ambulatory Visit: Payer: Self-pay | Admitting: Nurse Practitioner

## 2023-03-21 ENCOUNTER — Other Ambulatory Visit: Payer: Self-pay | Admitting: Physical Medicine and Rehabilitation

## 2023-03-21 ENCOUNTER — Telehealth: Payer: Self-pay

## 2023-03-21 MED ORDER — BACLOFEN 20 MG PO TABS: 40.0000 mg | ORAL_TABLET | Freq: Three times a day (TID) | ORAL | 0 refills | Status: DC | PRN

## 2023-03-21 NOTE — Telephone Encounter (Signed)
Patient called in to schedule a follow up with Dr Berline Chough , pt scheduled 5/13 and states he is out of baclofen  and needs a refill at least until follow up appointment since he is out

## 2023-03-21 NOTE — Telephone Encounter (Signed)
Can you all please call pt about his meds for refills ..he's called several times

## 2023-03-21 NOTE — Telephone Encounter (Signed)
Please advise KH 

## 2023-03-22 ENCOUNTER — Telehealth: Payer: Self-pay | Admitting: Physical Medicine and Rehabilitation

## 2023-03-22 ENCOUNTER — Other Ambulatory Visit: Payer: Self-pay | Admitting: Nurse Practitioner

## 2023-03-22 MED ORDER — BACLOFEN 20 MG PO TABS: 40.0000 mg | ORAL_TABLET | Freq: Three times a day (TID) | ORAL | 1 refills | Status: DC | PRN

## 2023-03-22 MED ORDER — BACLOFEN 20 MG PO TABS: 40.0000 mg | ORAL_TABLET | Freq: Three times a day (TID) | ORAL | 0 refills | Status: DC | PRN

## 2023-03-22 NOTE — Telephone Encounter (Signed)
Medication sent in today to [facility] pharmacy.

## 2023-03-22 NOTE — Telephone Encounter (Signed)
Done

## 2023-03-22 NOTE — Telephone Encounter (Signed)
Patient states issue resolved.

## 2023-03-22 NOTE — Telephone Encounter (Signed)
Medication sent in to [facility] pharmacy. Gh

## 2023-03-22 NOTE — Telephone Encounter (Signed)
Patient called stating he's having a hard time getting his baclofen filled. He said he's in so much pain its making his muscles tighten up where he can barely move.

## 2023-03-22 NOTE — Addendum Note (Signed)
Addended by: Genice Rouge on: 03/22/2023 12:12 PM   Modules accepted: Orders

## 2023-03-26 ENCOUNTER — Encounter: Payer: Self-pay | Admitting: Nurse Practitioner

## 2023-03-26 ENCOUNTER — Ambulatory Visit (INDEPENDENT_AMBULATORY_CARE_PROVIDER_SITE_OTHER): Payer: Medicaid Other | Admitting: Nurse Practitioner

## 2023-03-26 VITALS — BP 119/77 | HR 73 | Temp 97.6°F | Ht 72.0 in

## 2023-03-26 DIAGNOSIS — R1011 Right upper quadrant pain: Secondary | ICD-10-CM | POA: Diagnosis not present

## 2023-03-26 DIAGNOSIS — M25552 Pain in left hip: Secondary | ICD-10-CM

## 2023-03-26 DIAGNOSIS — L89320 Pressure ulcer of left buttock, unstageable: Secondary | ICD-10-CM | POA: Diagnosis not present

## 2023-03-26 DIAGNOSIS — G8929 Other chronic pain: Secondary | ICD-10-CM

## 2023-03-26 DIAGNOSIS — N319 Neuromuscular dysfunction of bladder, unspecified: Secondary | ICD-10-CM

## 2023-03-26 DIAGNOSIS — F1290 Cannabis use, unspecified, uncomplicated: Secondary | ICD-10-CM

## 2023-03-26 DIAGNOSIS — K592 Neurogenic bowel, not elsewhere classified: Secondary | ICD-10-CM

## 2023-03-26 DIAGNOSIS — D638 Anemia in other chronic diseases classified elsewhere: Secondary | ICD-10-CM

## 2023-03-26 DIAGNOSIS — B999 Unspecified infectious disease: Secondary | ICD-10-CM

## 2023-03-26 NOTE — Assessment & Plan Note (Signed)
Patient States That He Wears Incontinence pads, condom catheters sometimes.
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
## 2023-03-26 NOTE — Assessment & Plan Note (Signed)
Need to avoid using marijuana including risk of addiction to illicit drugs discussed

## 2023-03-26 NOTE — Assessment & Plan Note (Signed)
Patient referred to wound care office.

## 2023-03-26 NOTE — Assessment & Plan Note (Signed)
Patient continues to have abdominal pain on the right side. This could be musculoskeletal pain but patient insist that he has hernia. Patient referred to GI for further evaluation  Recent CT shows   No acute intra-abdominal or intrapelvic abnormalities.   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   Sequela of dorsal decubitus ulcers at pelvis.

## 2023-03-26 NOTE — Assessment & Plan Note (Signed)
Lab Results  Component Value Date   WBC 4.7 12/16/2022   HGB 11.6 (L) 12/16/2022   HCT 38.9 (L) 12/16/2022   MCV 74.4 (L) 12/16/2022   PLT 319 12/16/2022  Thanks ferrous sulfate 325 mg daily Will recheck CBC and iron panel at next visit

## 2023-03-26 NOTE — Patient Instructions (Addendum)
Please take Tylenol 650 mg every 6 hours as needed for pain.  Follow-up with orthopedics as planned.  Please get your Tdap vaccine at the pharmacy.  Thanks for choosing [facility] we consider it a privelige to serve you.

## 2023-03-26 NOTE — Assessment & Plan Note (Signed)
Recently evaluated by orthopedics Patient encouraged to take Tylenol 650 mg every 6 hours as needed Continue baclofen 40 mg 3 times daily as needed Was encouraged to maintain close follow-up with orthopedics

## 2023-03-26 NOTE — Assessment & Plan Note (Signed)
takes MiraLAX 17 g daily.  Reports that his bowel movement is regular

## 2023-04-02 ENCOUNTER — Encounter: Payer: Self-pay | Admitting: Physical Medicine and Rehabilitation

## 2023-04-02 ENCOUNTER — Encounter
Payer: Medicaid Other | Attending: Physical Medicine and Rehabilitation | Admitting: Physical Medicine and Rehabilitation

## 2023-04-02 VITALS — BP 117/69 | HR 82 | Ht 72.0 in | Wt 130.8 lb

## 2023-04-02 DIAGNOSIS — N319 Neuromuscular dysfunction of bladder, unspecified: Secondary | ICD-10-CM | POA: Insufficient documentation

## 2023-04-02 DIAGNOSIS — N521 Erectile dysfunction due to diseases classified elsewhere: Secondary | ICD-10-CM | POA: Diagnosis not present

## 2023-04-02 DIAGNOSIS — L89324 Pressure ulcer of left buttock, stage 4: Secondary | ICD-10-CM | POA: Diagnosis not present

## 2023-04-02 DIAGNOSIS — R252 Cramp and spasm: Secondary | ICD-10-CM | POA: Insufficient documentation

## 2023-04-02 DIAGNOSIS — G8220 Paraplegia, unspecified: Secondary | ICD-10-CM

## 2023-04-02 MED ORDER — BACLOFEN 20 MG PO TABS: 40.0000 mg | ORAL_TABLET | Freq: Four times a day (QID) | ORAL | 1 refills | Status: DC

## 2023-04-02 NOTE — Patient Instructions (Addendum)
Keep getting ulcers due to HO on L hip- once healed, needs to have surgery on L HO immediately afterwards, so doesn't get another wound or at the same time! Seeing Dr Andrey Campanile next month.    2. 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   - see if Dr Andrey Campanile can have Dr Roxan Hockey can see you WHEN seeing Dr Andrey Campanile.   Called Dr Roxan Hockey and d/w him.   3. Wait on pain meds for today, but if things not better in 1/2- 1 month, will restart Tramadol.  And would need UDS at that time.   4.  Will send Urology evaluations in [place]- Pt is paraplegic- needs evaluation for neurogenic bladder as well as injections for intimacy.    5. Has standing w/c- too light to wheel regularly- feels like flipping backwards.   6. NEEDS ROHO cushion- prefer quatro- to help due to multiple stage IV pressure ulcers.  Call Barbara Cower to get one.   7. F/U- 3 months- double appt.  8. Refill Baclofen 40 mg 4x/day #720 for 3 months supply- 1 refill.
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
## 2023-04-09 ENCOUNTER — Encounter: Payer: Medicaid Other | Admitting: Physical Medicine and Rehabilitation

## 2023-04-13 ENCOUNTER — Ambulatory Visit (HOSPITAL_BASED_OUTPATIENT_CLINIC_OR_DEPARTMENT_OTHER): Payer: Medicaid Other | Admitting: Internal Medicine

## 2023-04-16 ENCOUNTER — Emergency Department (HOSPITAL_COMMUNITY)
Admission: EM | Admit: 2023-04-16 | Discharge: 2023-04-16 | Discharge status: Against Medical Advice | Payer: Medicaid Other | Attending: Emergency Medicine | Admitting: Emergency Medicine

## 2023-04-16 ENCOUNTER — Other Ambulatory Visit: Payer: Self-pay

## 2023-04-16 ENCOUNTER — Encounter (HOSPITAL_COMMUNITY): Payer: Self-pay

## 2023-04-16 DIAGNOSIS — Z5321 Procedure and treatment not carried out due to patient leaving prior to being seen by health care provider: Secondary | ICD-10-CM | POA: Diagnosis not present

## 2023-04-16 DIAGNOSIS — S31829D Unspecified open wound of left buttock, subsequent encounter: Secondary | ICD-10-CM | POA: Diagnosis present

## 2023-04-16 DIAGNOSIS — X58XXXD Exposure to other specified factors, subsequent encounter: Secondary | ICD-10-CM | POA: Insufficient documentation

## 2023-04-16 LAB — CBC WITH DIFFERENTIAL/PLATELET
Abs Immature Granulocytes: 0.02 10*3/uL (ref 0.00–0.07)
Basophils Absolute: 0 10*3/uL (ref 0.0–0.1)
Basophils Relative: 0 %
Eosinophils Absolute: 0.1 10*3/uL (ref 0.0–0.5)
Eosinophils Relative: 1 %
HCT: 45.5 % (ref 39.0–52.0)
Hemoglobin: 13.4 g/dL (ref 13.0–17.0)
Immature Granulocytes: 0 %
Lymphocytes Relative: 31 %
Lymphs Abs: 1.9 10*3/uL (ref 0.7–4.0)
MCH: 22.7 pg — ABNORMAL LOW (ref 26.0–34.0)
MCHC: 29.5 g/dL — ABNORMAL LOW (ref 30.0–36.0)
MCV: 77 fL — ABNORMAL LOW (ref 80.0–100.0)
Monocytes Absolute: 0.4 10*3/uL (ref 0.1–1.0)
Monocytes Relative: 7 %
Neutro Abs: 3.7 10*3/uL (ref 1.7–7.7)
Neutrophils Relative %: 61 %
Platelets: 364 10*3/uL (ref 150–400)
RBC: 5.91 MIL/uL — ABNORMAL HIGH (ref 4.22–5.81)
RDW: 17.5 % — ABNORMAL HIGH (ref 11.5–15.5)
WBC: 6.1 10*3/uL (ref 4.0–10.5)
nRBC: 0 % (ref 0.0–0.2)

## 2023-04-16 LAB — COMPREHENSIVE METABOLIC PANEL
ALT: 24 U/L (ref 0–44)
AST: 19 U/L (ref 15–41)
Albumin: 4 g/dL (ref 3.5–5.0)
Alkaline Phosphatase: 54 U/L (ref 38–126)
Anion gap: 12 (ref 5–15)
BUN: 22 mg/dL — ABNORMAL HIGH (ref 6–20)
CO2: 23 mmol/L (ref 22–32)
Calcium: 9.5 mg/dL (ref 8.9–10.3)
Chloride: 103 mmol/L (ref 98–111)
Creatinine, Ser: 0.73 mg/dL (ref 0.61–1.24)
GFR, Estimated: >60 mL/min (ref >60)
Glucose, Bld: 105 mg/dL — ABNORMAL HIGH (ref 70–99)
Potassium: 4.5 mmol/L (ref 3.5–5.1)
Sodium: 138 mmol/L (ref 135–145)
Total Bilirubin: 0.5 mg/dL (ref 0.3–1.2)
Total Protein: 8 g/dL (ref 6.5–8.1)

## 2023-04-16 NOTE — ED Triage Notes (Signed)
Patient here with ongoing wound to left buttock for over 1 year. States that he was discharged from wound clinic

## 2023-04-17 ENCOUNTER — Encounter (HOSPITAL_COMMUNITY): Payer: Self-pay

## 2023-04-17 ENCOUNTER — Emergency Department (HOSPITAL_COMMUNITY)
Admission: EM | Admit: 2023-04-17 | Discharge: 2023-04-17 | Disposition: A | Discharge status: Home / Self Care | Payer: Medicaid Other | Attending: Emergency Medicine | Admitting: Emergency Medicine

## 2023-04-17 ENCOUNTER — Telehealth: Payer: Self-pay

## 2023-04-17 ENCOUNTER — Other Ambulatory Visit: Payer: Self-pay

## 2023-04-17 DIAGNOSIS — L89324 Pressure ulcer of left buttock, stage 4: Secondary | ICD-10-CM

## 2023-04-17 DIAGNOSIS — Z5189 Encounter for other specified aftercare: Secondary | ICD-10-CM

## 2023-04-17 DIAGNOSIS — Z4800 Encounter for change or removal of nonsurgical wound dressing: Secondary | ICD-10-CM | POA: Insufficient documentation

## 2023-04-17 DIAGNOSIS — L89314 Pressure ulcer of right buttock, stage 4: Secondary | ICD-10-CM | POA: Diagnosis not present

## 2023-04-17 DIAGNOSIS — M79651 Pain in right thigh: Secondary | ICD-10-CM | POA: Diagnosis present

## 2023-04-17 MED ORDER — OXYCODONE-ACETAMINOPHEN 5-325 MG PO TABS
1.0000 | ORAL_TABLET | Freq: Once | ORAL | Status: AC
  Administered 2023-04-17: 1 via ORAL
  Filled 2023-04-17: qty 1

## 2023-04-17 MED ORDER — OXYCODONE HCL 5 MG PO TABS: 5.0000 mg | ORAL_TABLET | Freq: Four times a day (QID) | ORAL | 0 refills | Status: DC | PRN

## 2023-04-17 NOTE — ED Provider Notes (Signed)
EMERGENCY DEPARTMENT AT [facility] ([place]) Provider Note   CSN: [id number] Arrival date & time: 04/17/23  1134     History  Chief Complaint  Patient presents with   Wound Check    [name] is a 32 y.o. male.  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  The patient states that his pain has been getting progressively worse to the point where he is unable to sit comfortably.  He states that he has been on baclofen for pain control without any relief.  States that he was previously on tramadol for his back but no longer takes this.  He states that he was discharged from wound care and has not seen him for a year and has been following with his PMR doctor who scheduled him follow-up with the surgeon but he states that his weeks out.  He states that he felt subjectively feverish last night but denies any purulent drainage.  He states he does do daily dressing changes.  The history is provided by the patient.  Wound Check       Home Medications Prior to Admission medications   Medication Sig Start Date End Date Taking? Authorizing Provider  oxyCODONE (ROXICODONE) 5 MG immediate release tablet Take 1 tablet (5 mg total) by mouth every 6 (six) hours as needed for severe pain. 04/17/23  Yes Elayne Snare K, DO  ascorbic acid (VITAMIN C) 500 MG tablet Take 1 tablet (500 mg total) by mouth 2 (two) times daily. 10/27/22   Burnadette Pop, MD  baclofen (LIORESAL) 20 MG tablet Take 2 tablets (40 mg total) by mouth 4 (four) times daily. 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 10/28/22   Burnadette Pop, MD  polyethylene glycol powder (GLYCOLAX) 17 GM/SCOOP powder Mix 17 grams as  directed and take by mouth daily. 10/28/22   Burnadette Pop, MD  traMADol (ULTRAM) 50 MG tablet TAKE 2 TABLETS (100 MG TOTAL) BY MOUTH 4 (FOUR) TIMES DAILY. Patient not taking: Reported on 04/02/2023 02/08/22   [provider]  vitamin A 3 MG (10000 UNITS) capsule Take 1 capsule (10,000 Units total) by mouth daily. 10/28/22   Burnadette Pop, MD  zinc sulfate 220 (50 Zn) MG capsule Take 1 capsule (220 mg total) by mouth daily. 10/27/22   Burnadette Pop, MD      Allergies    Ciprofloxacin    Review of Systems   Review of Systems  Physical Exam Updated Vital Signs BP 132/88 (BP Location: Right Arm)   Pulse 89   Temp 98.9 F (37.2 C)   Resp 16   Ht 5\' 11"  (1.803 m)   Wt 54.4 kg   SpO2 100%   BMI 16.74 kg/m  Physical Exam Vitals and nursing note reviewed.  Constitutional:      General: He is not in acute distress.    Appearance: Normal appearance.  HENT:     Head: Normocephalic and atraumatic.     Nose: Nose normal.     Mouth/Throat:     Mouth: Mucous membranes are moist.  Eyes:     Extraocular Movements: Extraocular movements intact.  Pulmonary:     Effort: Pulmonary effort is  normal.  Abdominal:     General: Abdomen is flat.  Musculoskeletal:     Cervical back: Normal range of motion.  Skin:    General: Skin is warm and dry.     Comments: ~2-3 cm diameter stage IV ulcer to L lower buttock, no surrounding erythema, warmth or drainage Stage 2 ~1x2 cm ulcer to R buttock without surrounding erythema, warmth or drainage  Neurological:     Mental Status: He is alert.     ED Results / Procedures / Treatments   Labs (all labs ordered are listed, but only abnormal results are displayed) Labs Reviewed - No data to display  EKG None  Radiology No results found.  Procedures Procedures    Medications Ordered in ED Medications  oxyCODONE-acetaminophen (PERCOCET/ROXICET) 5-325 MG per tablet 1 tablet (has no administration in time range)    ED Course/  Medical Decision Making/ A&P                             Medical Decision Making This patient presents to the ED with chief complaint(s) of sacral wounds with pertinent past medical history of paraplegia which further complicates the presenting complaint. The complaint involves an extensive differential diagnosis and also carries with it a high risk of complications and morbidity.    The differential diagnosis includes patient has no signs of infection on exam, no evidence of sepsis, pain likely due to wounds  Additional history obtained: Additional history obtained from N/A Records reviewed outpatient wound care and PMR records  ED Course and Reassessment: On patient's arrival he is hemodynamically stable in no acute distress.  He has noted no new falls or injuries making fracture dislocation unlikely.  His wounds appear clean and dry without surrounding erythema, warmth or drainage and he is hemodynamically stable and afebrile without signs of infection or sepsis.  Patient will be given short course of pain control and was recommended to follow-up with his PMR doctor for further management of his chronic wounds and was given strict return precautions.  Independent labs interpretation:  N/A  Independent visualization of imaging: N/A  Consultation: - Consulted or discussed management/test interpretation w/ external professional: N/A  Consideration for admission or further workup: Patient has no emergent conditions requiring admission or further work-up at this time and is stable for discharge home with primary care follow-up  Social Determinants of health: N/A    Risk Prescription drug management.          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

## 2023-04-17 NOTE — Transitions of Care (Post Inpatient/ED Visit) (Cosign Needed)
   04/17/2023  Name: ZEDDIE COTTRILL MRN: [id number] DOB: [birth year]  Today's TOC FU Call Status: Today's TOC FU Call Status:: Successful TOC FU Call Competed TOC FU Call Complete Date: 04/17/23  Transition Care Management Follow-up Telephone Call Date of Discharge: 04/16/23 Discharge Facility: [facility]) Type of Discharge: Emergency Department How have you been since you were released from the hospital?: Worse Any questions or concerns?: Yes Patient Questions/Concerns:: has not improved at all and wanted to be evalutaed more  Items Reviewed: Medications obtained,verified, and reconciled?: Yes (Medications Reviewed) Any new allergies since your discharge?: No Dietary orders reviewed?: NA Do you have support at home?: No  Medications Reviewed Today: Medications Reviewed Today     Reviewed by Renelda Loma, RMA (Registered Medical Assistant) on 04/17/23 at 1247  Med List Status: <None>   Medication Order Taking? Sig Documenting Provider Last Dose Status Informant  ascorbic acid (VITAMIN C) 500 MG tablet [id number] Yes Take 1 tablet (500 mg total) by mouth 2 (two) times daily. Burnadette Pop, MD Taking Active   baclofen (LIORESAL) 20 MG tablet [id number] Yes Take 2 tablets (40 mg total) by mouth 4 (four) times daily. 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 Burnadette Pop, MD Taking Active   oxyCODONE (ROXICODONE) 5 MG immediate release tablet [id number] Yes Take 1 tablet (5 mg total) by mouth every 6 (six) hours as needed for severe pain. Elayne Snare K, DO Taking Active   polyethylene glycol powder (GLYCOLAX) 17 GM/SCOOP powder [id number] Yes Mix 17 grams as directed and take by mouth daily. Burnadette Pop, MD Taking Active   traMADol (ULTRAM) 50 MG tablet [id number] No TAKE 2 TABLETS (100 MG TOTAL) BY MOUTH 4 (FOUR) TIMES  DAILY.  Patient not taking: Reported on 04/02/2023   [provider] Not Taking Active   vitamin A 3 MG (10000 UNITS) capsule [id number] Yes Take 1 capsule (10,000 Units total) by mouth daily. Burnadette Pop, MD Taking Active   zinc sulfate 220 (50 Zn) MG capsule [id number] Yes Take 1 capsule (220 mg total) by mouth daily. Burnadette Pop, MD Taking Active             Home Care and Equipment/Supplies: Were Home Health Services Ordered?: NA Any new equipment or medical supplies ordered?: NA  Functional Questionnaire: Do you need assistance with bathing/showering or dressing?: No Do you need assistance with meal preparation?: No Do you need assistance with eating?: No Do you have difficulty maintaining continence: No Do you need assistance with getting out of bed/getting out of a chair/moving?: No Do you have difficulty managing or taking your medications?: No  Follow up appointments reviewed: PCP Follow-up appointment confirmed?: Yes Date of PCP follow-up appointment?: 04/25/23 Follow-up Provider: [facility] Follow-up appointment confirmed?: Yes Do you need transportation to your follow-up appointment?: No Do you understand care options if your condition(s) worsen?: Yes-patient verbalized understanding    SIGNATURE Renelda Loma RMA

## 2023-04-17 NOTE — ED Triage Notes (Signed)
PT here with pressure ulcer on his  buttocks for a yr and states that he is basically sitting on bone.Pt also states that is interfering with his appetite

## 2023-04-17 NOTE — Discharge Instructions (Signed)
You were seen in the emergency department for evaluation of your pressure wounds.  There were no signs of infection today and you should continue to follow-up with your PMR doctor or in the wound care center for further management of your wounds to help them heal.  You can take Tylenol every 6 hours as needed for pain and I have given you some oxycodone to take for breakthrough pain.  Please make sure that you are taking your stool softeners with this as it can worsen your constipation.  You should return to the emergency department if you are having fevers, pus draining from your wound or any other new or concerning symptoms.

## 2023-04-20 ENCOUNTER — Emergency Department (HOSPITAL_COMMUNITY)
Admission: EM | Admit: 2023-04-20 | Discharge: 2023-04-20 | Disposition: A | Discharge status: Home / Self Care | Payer: Medicaid Other | Attending: Emergency Medicine | Admitting: Emergency Medicine

## 2023-04-20 ENCOUNTER — Other Ambulatory Visit: Payer: Self-pay

## 2023-04-20 DIAGNOSIS — L89159 Pressure ulcer of sacral region, unspecified stage: Secondary | ICD-10-CM | POA: Diagnosis not present

## 2023-04-20 DIAGNOSIS — G8929 Other chronic pain: Secondary | ICD-10-CM | POA: Diagnosis not present

## 2023-04-20 DIAGNOSIS — M8660 Other chronic osteomyelitis, unspecified site: Secondary | ICD-10-CM | POA: Insufficient documentation

## 2023-04-20 DIAGNOSIS — L89320 Pressure ulcer of left buttock, unstageable: Secondary | ICD-10-CM | POA: Diagnosis present

## 2023-04-20 LAB — BASIC METABOLIC PANEL
Anion gap: 9 (ref 5–15)
BUN: 18 mg/dL (ref 6–20)
CO2: 25 mmol/L (ref 22–32)
Calcium: 8.7 mg/dL — ABNORMAL LOW (ref 8.9–10.3)
Chloride: 102 mmol/L (ref 98–111)
Creatinine, Ser: 0.77 mg/dL (ref 0.61–1.24)
GFR, Estimated: >60 mL/min (ref >60)
Glucose, Bld: 120 mg/dL — ABNORMAL HIGH (ref 70–99)
Potassium: 3.6 mmol/L (ref 3.5–5.1)
Sodium: 136 mmol/L (ref 135–145)

## 2023-04-20 LAB — CBC WITH DIFFERENTIAL/PLATELET
Abs Immature Granulocytes: 0.01 10*3/uL (ref 0.00–0.07)
Basophils Absolute: 0 10*3/uL (ref 0.0–0.1)
Basophils Relative: 1 %
Eosinophils Absolute: 0 10*3/uL (ref 0.0–0.5)
Eosinophils Relative: 0 %
HCT: 43 % (ref 39.0–52.0)
Hemoglobin: 12.8 g/dL — ABNORMAL LOW (ref 13.0–17.0)
Immature Granulocytes: 0 %
Lymphocytes Relative: 29 %
Lymphs Abs: 2.1 10*3/uL (ref 0.7–4.0)
MCH: 22.6 pg — ABNORMAL LOW (ref 26.0–34.0)
MCHC: 29.8 g/dL — ABNORMAL LOW (ref 30.0–36.0)
MCV: 76 fL — ABNORMAL LOW (ref 80.0–100.0)
Monocytes Absolute: 0.5 10*3/uL (ref 0.1–1.0)
Monocytes Relative: 7 %
Neutro Abs: 4.7 10*3/uL (ref 1.7–7.7)
Neutrophils Relative %: 63 %
Platelets: 306 10*3/uL (ref 150–400)
RBC: 5.66 MIL/uL (ref 4.22–5.81)
RDW: 17 % — ABNORMAL HIGH (ref 11.5–15.5)
WBC: 7.4 10*3/uL (ref 4.0–10.5)
nRBC: 0 % (ref 0.0–0.2)

## 2023-04-20 LAB — LACTIC ACID, PLASMA: Lactic Acid, Venous: 1.3 mmol/L (ref 0.5–1.9)

## 2023-04-20 MED ORDER — OXYCODONE-ACETAMINOPHEN 5-325 MG PO TABS: 1.0000 | ORAL_TABLET | Freq: Four times a day (QID) | ORAL | 0 refills | Status: DC | PRN

## 2023-04-20 MED ORDER — OXYCODONE-ACETAMINOPHEN 5-325 MG PO TABS
1.0000 | ORAL_TABLET | Freq: Once | ORAL | Status: AC
  Administered 2023-04-20: 1 via ORAL
  Filled 2023-04-20: qty 1

## 2023-04-20 MED ORDER — CEFADROXIL 500 MG PO CAPS: 500.0000 mg | ORAL_CAPSULE | Freq: Two times a day (BID) | ORAL | 0 refills | Status: DC

## 2023-04-20 NOTE — Discharge Instructions (Signed)
I prescribed you a short course of pain medication as well as antibiotics that we discussed, you can try to take them if you feel you are able to tolerate them.  Ultimately, you need to follow-up with your wound care specialist as had been previously planned.  There is no evidence of developing bacteremia or sepsis today.

## 2023-04-20 NOTE — ED Triage Notes (Signed)
Pt arrived via POV. C/o stage 4 pressure injury on L buttock that he is concerned is infected. Highest temp at home 103.6 last night
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
## 2023-04-20 NOTE — ED Notes (Signed)
Pt left prior to receiving d/c instructions.

## 2023-04-20 NOTE — ED Provider Notes (Signed)
I provided a substantive portion of the care of this patient.  I personally made/approved the management plan for this patient and take responsibility for the patient management.      32 year old male with history of chronic stage IV ulcer presents with continued pain.  Seen by myself recently for similar symptoms.  As wound care appointment scheduled for 2 weeks.  Labs here are reassuring.  Do not suspect systemic infection.  Will discharge   Lorre Nick, MD 04/20/23 1347

## 2023-04-24 ENCOUNTER — Telehealth: Payer: Self-pay

## 2023-04-24 NOTE — Transitions of Care (Post Inpatient/ED Visit) (Cosign Needed)
   04/24/2023  Name: [name] MRN: [id number] DOB: Sep 20, 1991  Today's TOC FU Call Status: Today's TOC FU Call Status:: Successful TOC FU Call Competed  Transition Care Management Follow-up Telephone Call Date of Discharge: 04/20/23 Discharge Facility: [facility]) Type of Discharge: Emergency Department Reason for ED Visit: Other: (open wound) How have you been since you were released from the hospital?: Same Any questions or concerns?: No  Items Reviewed: Did you receive and understand the discharge instructions provided?: Yes Medications obtained,verified, and reconciled?: Yes (Medications Reviewed) Any new allergies since your discharge?: No Dietary orders reviewed?: NA Do you have support at home?: No  Medications Reviewed Today: Medications Reviewed Today     Reviewed by Renelda Loma, RMA (Registered Medical Assistant) on 04/24/23 at [phone]  Med List Status: <None>   Medication Order Taking? Sig Documenting Provider Last Dose Status Informant  ascorbic acid (VITAMIN C) 500 MG tablet [id number] Yes Take 1 tablet (500 mg total) by mouth 2 (two) times daily. Burnadette Pop, MD Taking Active   baclofen (LIORESAL) 20 MG tablet [id number] Yes Take 2 tablets (40 mg total) by mouth 4 (four) times daily. For spasticity- is MAX dose of Baclofen for SCI Lovorn, Aundra Millet, MD Taking Active   cefadroxil (DURICEF) 500 MG capsule [id number] Yes Take 1 capsule (500 mg total) by mouth 2 (two) times daily. Prosperi, Christian H, PA-C Taking Active   ferrous sulfate 325 (65 FE) MG tablet [id number] Yes Take 1 tablet (325 mg total) by mouth daily with breakfast. Burnadette Pop, MD Taking Active   oxyCODONE (ROXICODONE) 5 MG immediate release tablet [id number] Yes Take 1 tablet (5 mg total) by mouth every 6 (six) hours as needed for severe pain. Rexford Maus, DO Taking Active   oxyCODONE-acetaminophen (PERCOCET/ROXICET) 5-325 MG tablet [id number] Yes Take 1 tablet by mouth every 6 (six)  hours as needed for severe pain. Prosperi, Christian H, PA-C Taking Active   polyethylene glycol powder (GLYCOLAX) 17 GM/SCOOP powder [id number] Yes Mix 17 grams as directed and take by mouth daily. Burnadette Pop, MD Taking Active   traMADol (ULTRAM) 50 MG tablet [id number] No TAKE 2 TABLETS (100 MG TOTAL) BY MOUTH 4 (FOUR) TIMES DAILY.  Patient not taking: Reported on 04/02/2023   [provider] Not Taking Active   vitamin A 3 MG (10000 UNITS) capsule [id number] Yes Take 1 capsule (10,000 Units total) by mouth daily. Burnadette Pop, MD Taking Active   zinc sulfate 220 (50 Zn) MG capsule [id number] Yes Take 1 capsule (220 mg total) by mouth daily. Burnadette Pop, MD Taking Active             Home Care and Equipment/Supplies: Were Home Health Services Ordered?: NA Any new equipment or medical supplies ordered?: NA  Functional Questionnaire: Do you need assistance with meal preparation?: No Do you need assistance with eating?: No Do you have difficulty maintaining continence: No Do you need assistance with getting out of bed/getting out of a chair/moving?: No Do you have difficulty managing or taking your medications?: No  Follow up appointments reviewed: PCP Follow-up appointment confirmed?: Yes Date of PCP follow-up appointment?: 04/25/23 Follow-up Provider: [facility] Follow-up appointment confirmed?: Yes Do you need transportation to your follow-up appointment?: No Do you understand care options if your condition(s) worsen?: Yes-patient verbalized understanding    SIGNATURE  Renelda Loma RMA

## 2023-04-25 ENCOUNTER — Encounter: Payer: Self-pay | Admitting: Nurse Practitioner

## 2023-04-25 ENCOUNTER — Ambulatory Visit (INDEPENDENT_AMBULATORY_CARE_PROVIDER_SITE_OTHER): Payer: Medicaid Other | Admitting: Nurse Practitioner

## 2023-04-25 VITALS — BP 135/72 | HR 93 | Temp 98.3°F | Ht 72.0 in

## 2023-04-25 DIAGNOSIS — G8929 Other chronic pain: Secondary | ICD-10-CM | POA: Diagnosis not present

## 2023-04-25 DIAGNOSIS — Z09 Encounter for follow-up examination after completed treatment for conditions other than malignant neoplasm: Secondary | ICD-10-CM | POA: Diagnosis not present

## 2023-04-25 DIAGNOSIS — L089 Local infection of the skin and subcutaneous tissue, unspecified: Secondary | ICD-10-CM

## 2023-04-25 DIAGNOSIS — L8994 Pressure ulcer of unspecified site, stage 4: Secondary | ICD-10-CM

## 2023-04-25 DIAGNOSIS — M8660 Other chronic osteomyelitis, unspecified site: Secondary | ICD-10-CM

## 2023-04-25 NOTE — Patient Instructions (Signed)
Please do not take tramadol oxycodone, Percocet at the same time to prevent overdose.  It can also cause constipation.  You should do eats plenty of fiber.  Take MiraLAX as needed for constipation  Please follow-up with the wound care specialist as planned   It is important that you exercise regularly at least 30 minutes 5 times a week as tolerated  Think about what you will eat, plan ahead. Choose " clean, green, fresh or frozen" over canned, processed or packaged foods which are more sugary, salty and fatty. 70 to 75% of food eaten should be vegetables and fruit. Three meals at set times with snacks allowed between meals, but they must be fruit or vegetables. Aim to eat over a 12 hour period , example 7 am to 7 pm, and STOP after  your last meal of the day. Drink water,generally about 64 ounces per day, no other drink is as healthy. Fruit juice is best enjoyed in a healthy way, by EATING the fruit.  Thanks for choosing [facility] we consider it a privelige to serve you.
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
## 2023-04-25 NOTE — Assessment & Plan Note (Signed)
ER visit notes labs and recommendations reviewed Patient encouraged to follow-up with wound care specialist as planned

## 2023-04-25 NOTE — Assessment & Plan Note (Signed)
Continue cefadroxil 500 mg twice daily as ordered. Follow-up with wound care specialist Patient declined assessment of his chronic wound today Has Percocet, oxycodone and tramadol ordered patient was encouraged to not take all medications together at once to prevent overdose and addiction.

## 2023-04-25 NOTE — Assessment & Plan Note (Signed)
Continue cefadroxil 500 mg twice daily as ordered. Follow-up with wound care specialist Patient declined assessment of his chronic wound today

## 2023-04-25 NOTE — Assessment & Plan Note (Signed)
Patient encouraged not take tramadol, oxycodone, Percocet at the same time to prevent overdose.  He verbalized understanding

## 2023-05-19 IMAGING — CR DG CHEST 2V
2 series · 2 of 2 positions shown · non-contrast
Comparison: Chest x-ray 04/08/2021.

CLINICAL DATA: Suspected sepsis

EXAM:
CHEST - 2 VIEW

[chest lat]
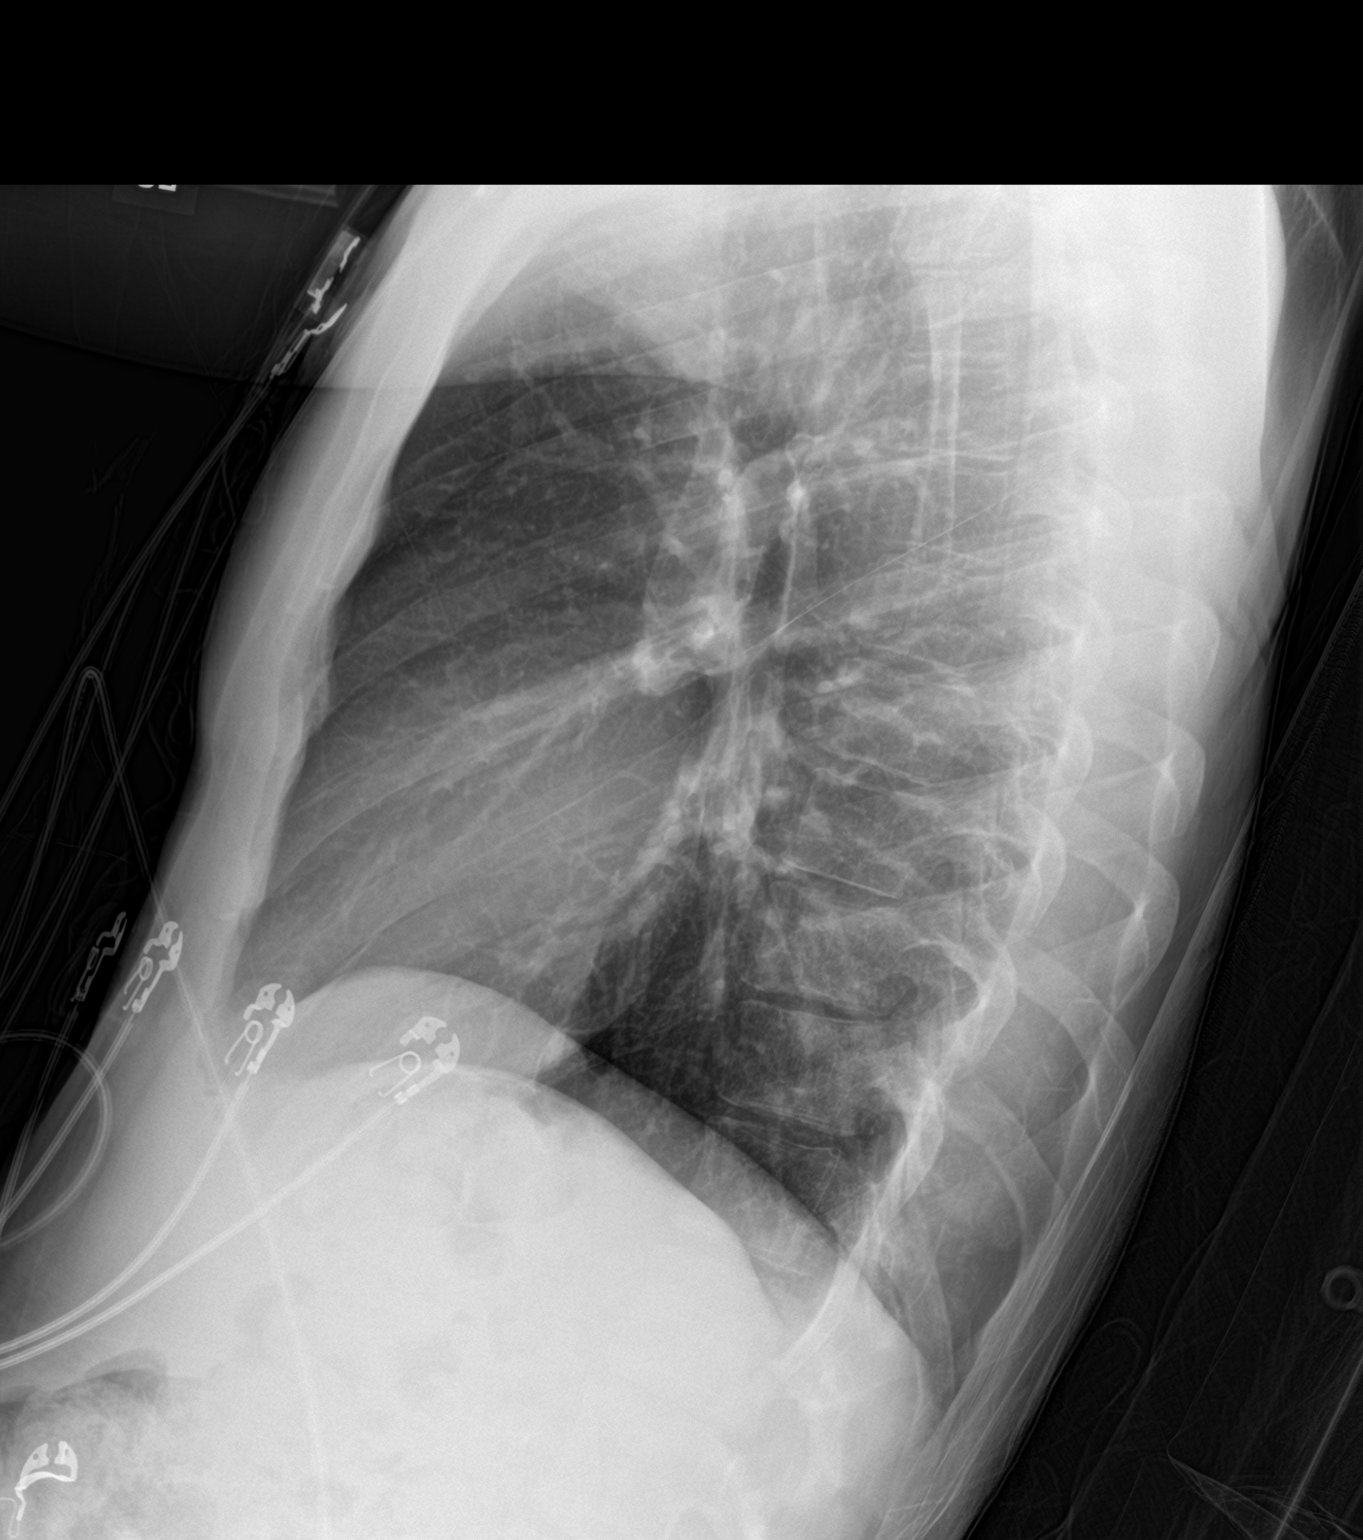

[chest ap]
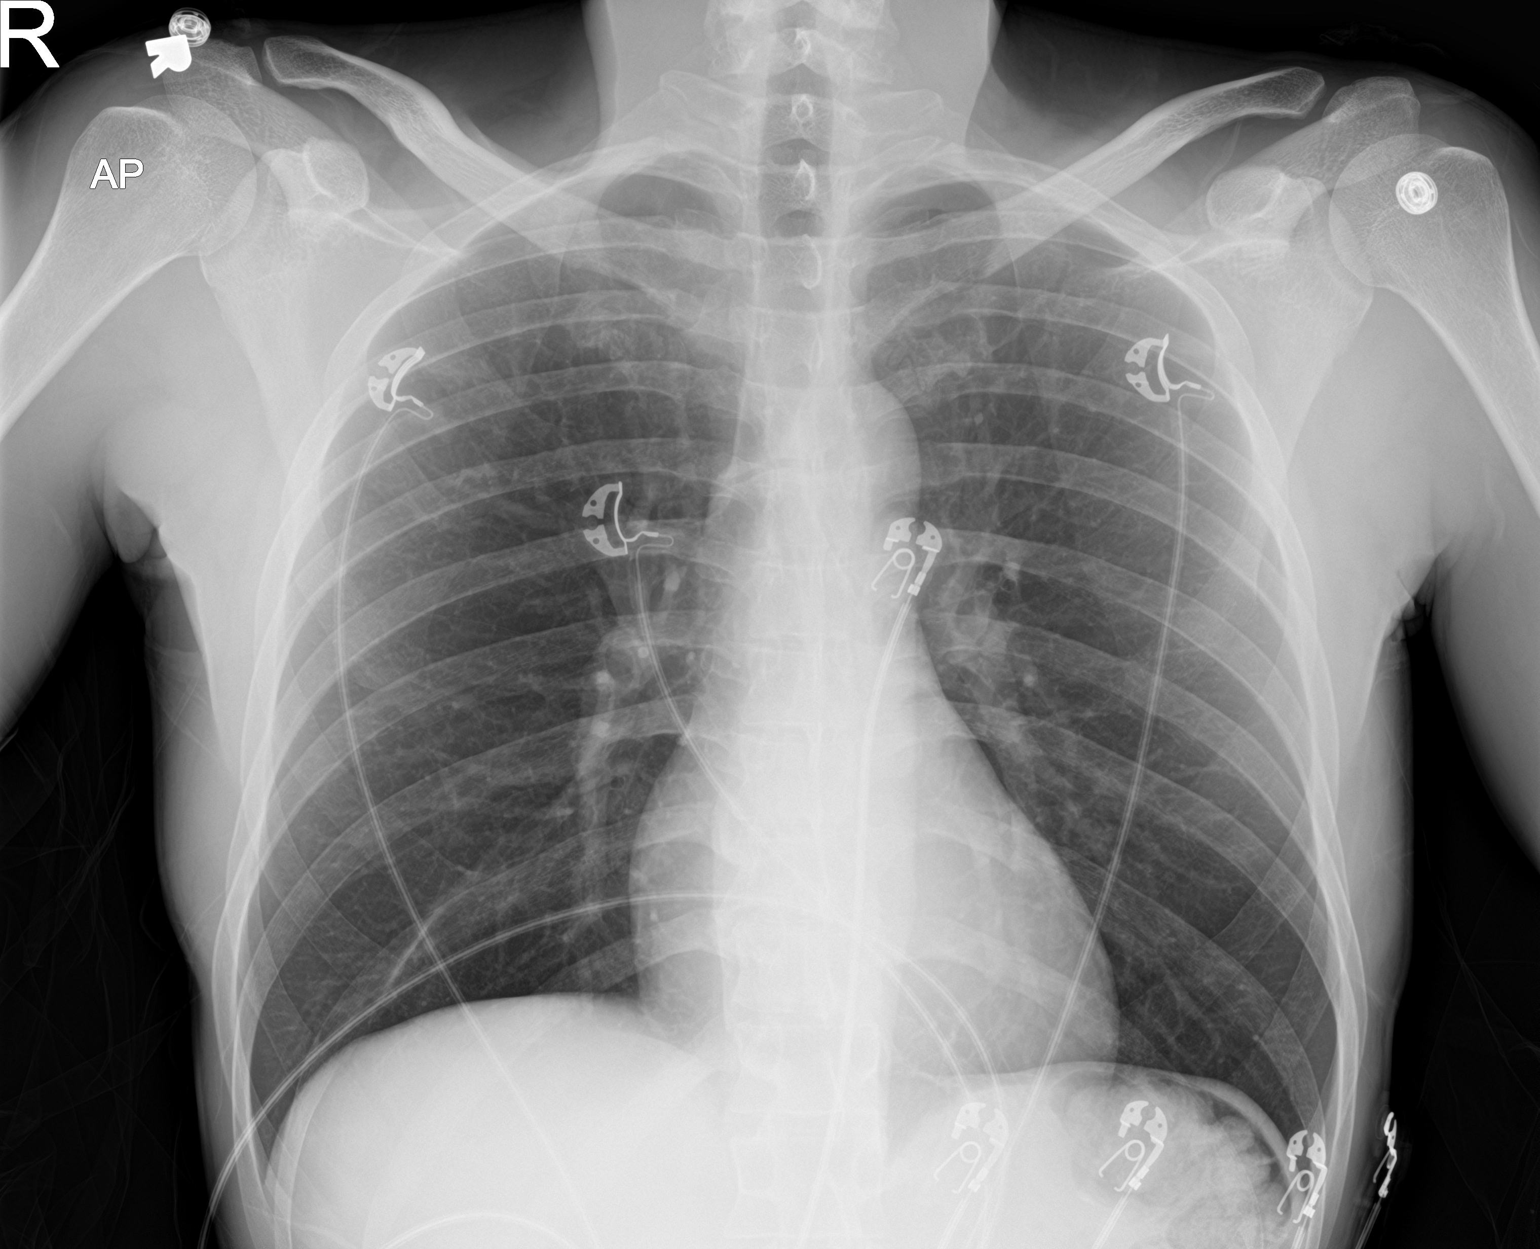

[2 of 2 positions shown; findings below may reference images not displayed]

FINDINGS: There is linear scarring in the right lower lung. The lungs are
otherwise clear. There is no pleural effusion or pneumothorax
identified. The cardiomediastinal silhouette is within normal
limits. The osseous structures are within normal limits.
IMPRESSION: No active cardiopulmonary disease.

## 2023-05-20 IMAGING — CT CT PELVIS W/ CM
2 of 3 series · 16 of 46 positions shown, 18 images · IV contrast (APPLIED)
Comparison: None.

CLINICAL DATA: Abscess, anal or rectal

EXAM:
CT PELVIS WITH CONTRAST
TECHNIQUE: Multidetector CT imaging of the pelvis was performed using the
standard protocol following the bolus administration of intravenous
contrast.
CONTRAST:  75mL OMNIPAQUE IOHEXOL 350 MG/ML SOLN

[Series 5: soft tissue · axial · 0.72mm/px · z∈[+860,+1118]mm · 13 of 149 slices shown, 15 images]
[im 10/149  soft-tissue]
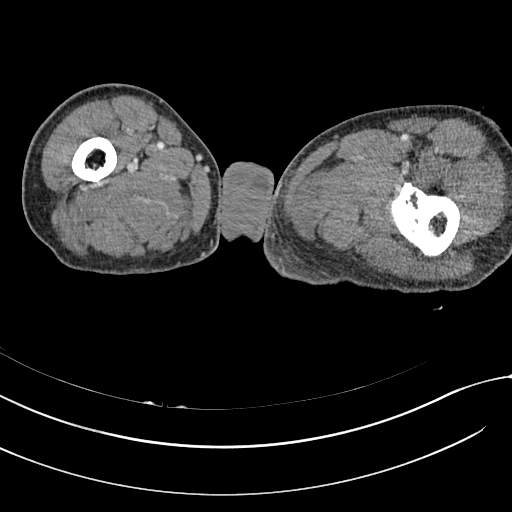
[im 10/149  bone]
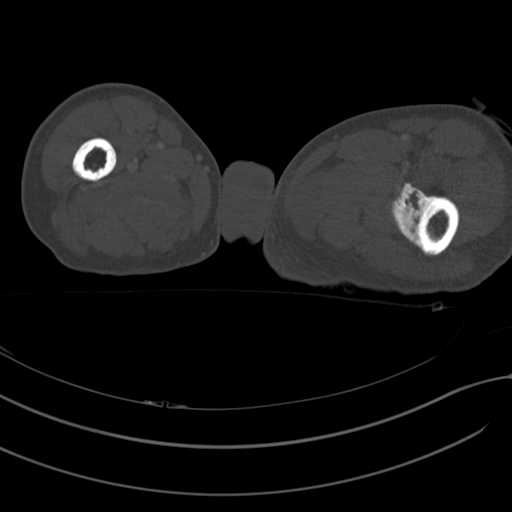
[im 20/149  soft-tissue]
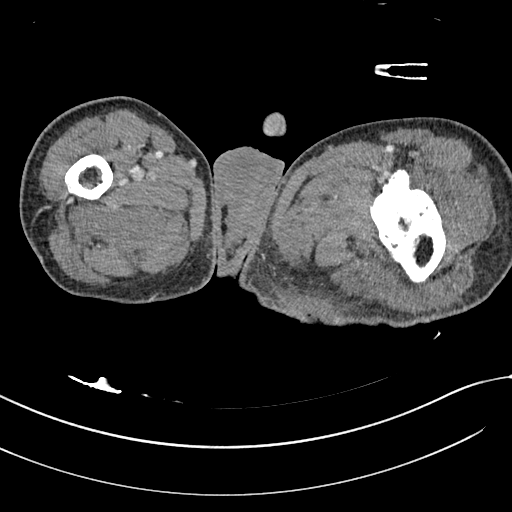
[im 29/149  soft-tissue]
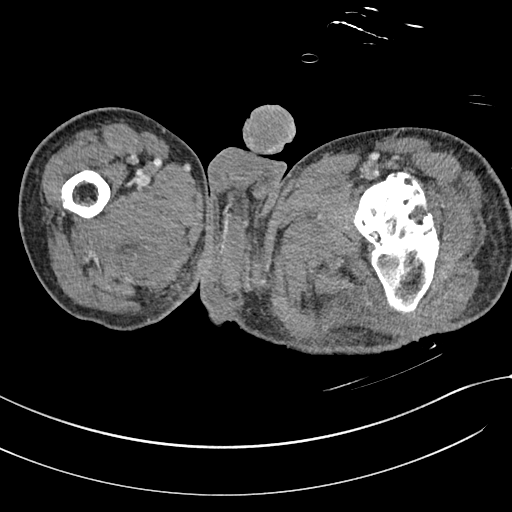
[im 43/149  soft-tissue]
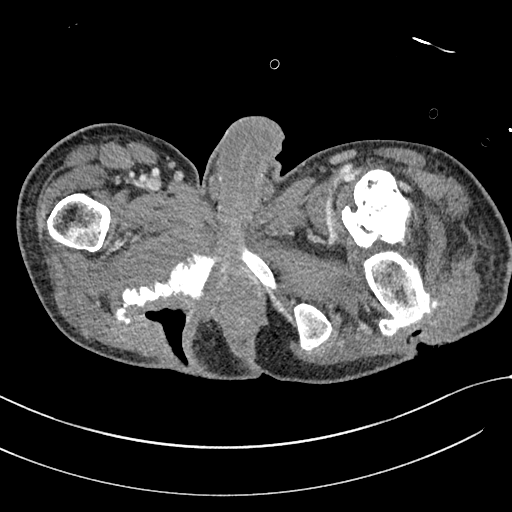
[im 53/149  soft-tissue]
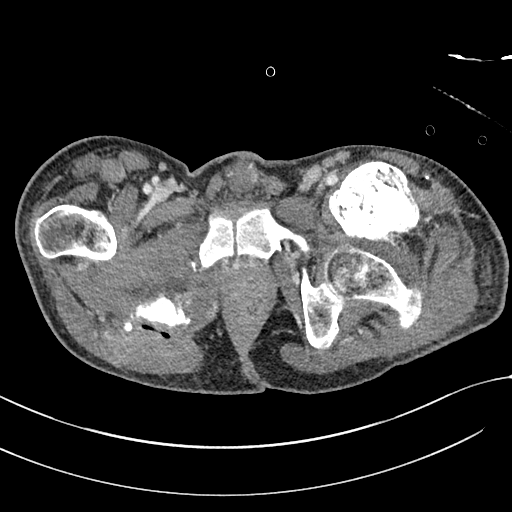
[im 63/149  soft-tissue]
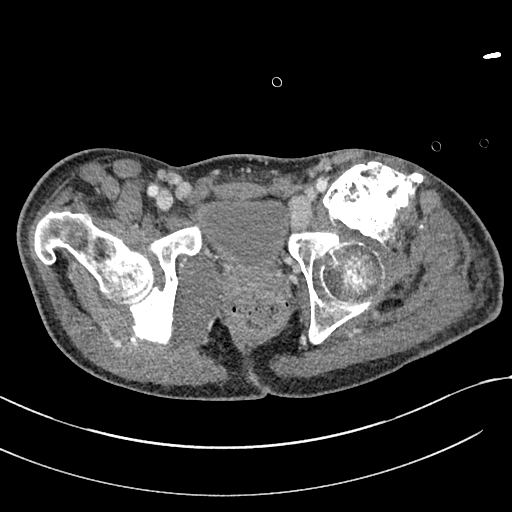
[im 77/149  soft-tissue]
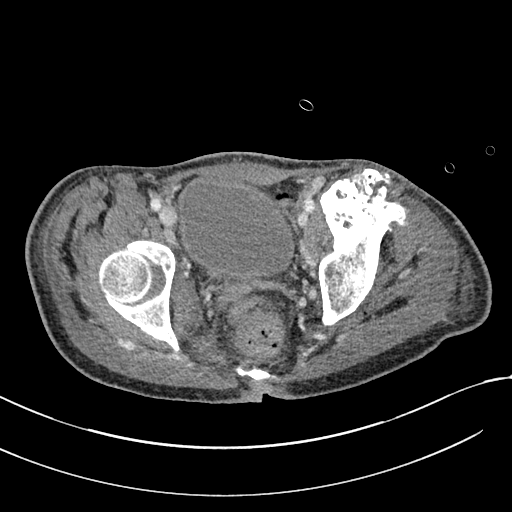
[im 86/149  soft-tissue]
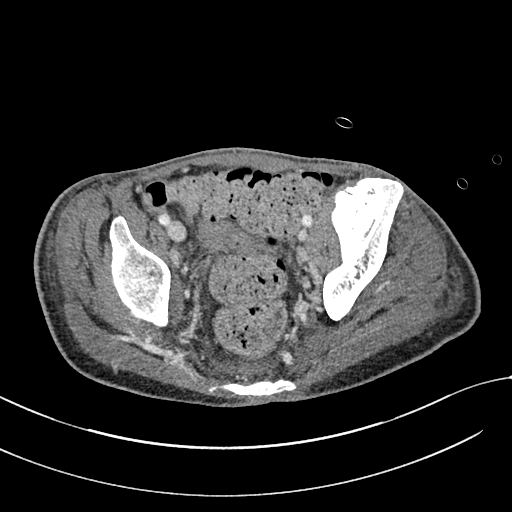
[im 96/149  soft-tissue]
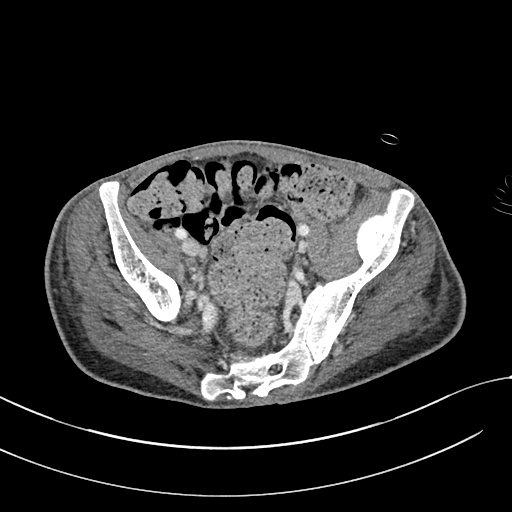
[im 96/149  bone]
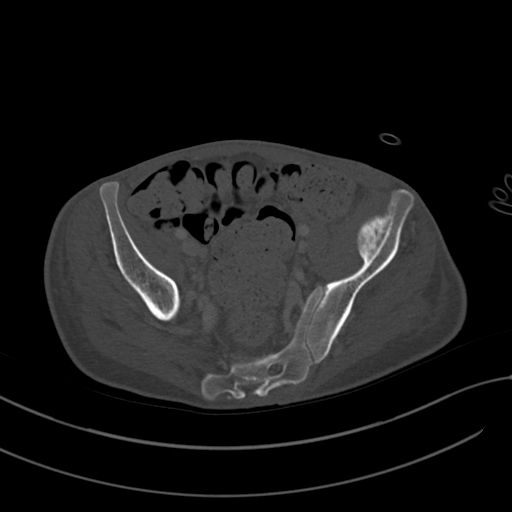
[im 106/149  soft-tissue]
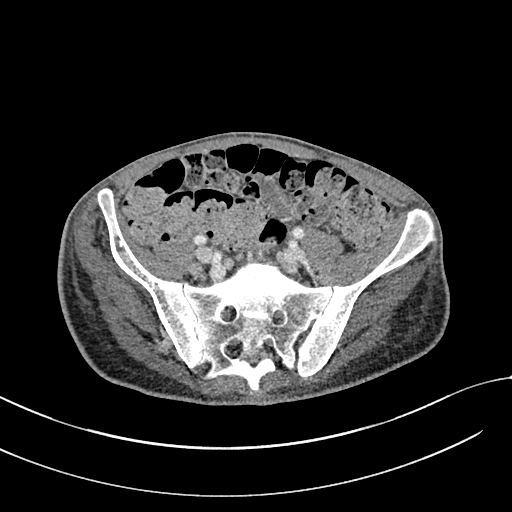
[im 120/149  soft-tissue]
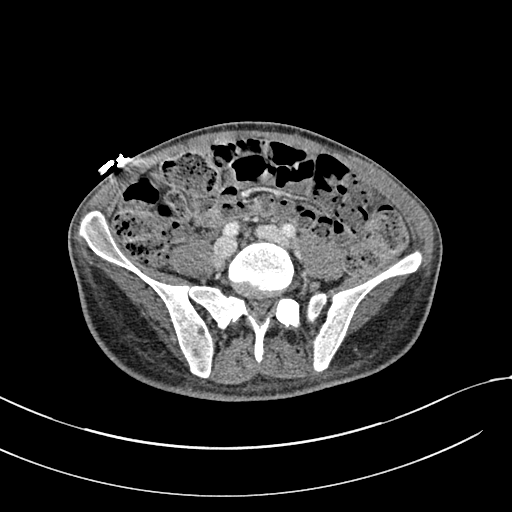
[im 129/149  soft-tissue]
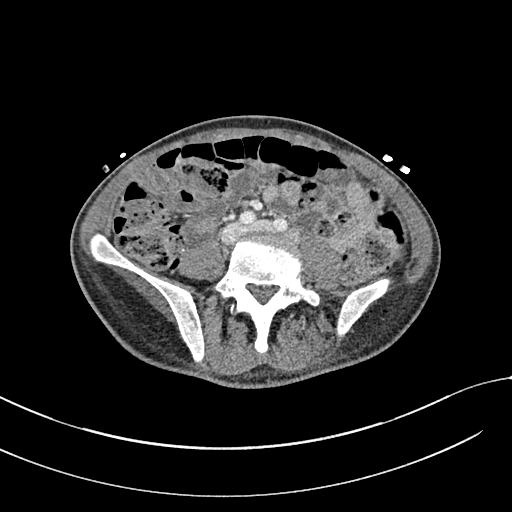
[im 139/149  soft-tissue]
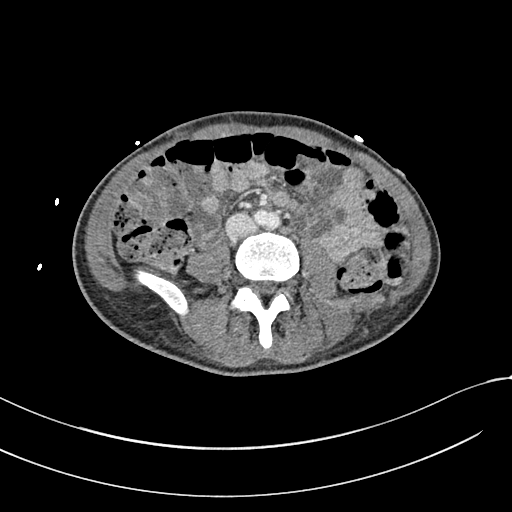

[Series 6: cor soft · coronal · 0.61mm/px · 3 of 105 slices shown]
[im 35/105  soft-tissue]
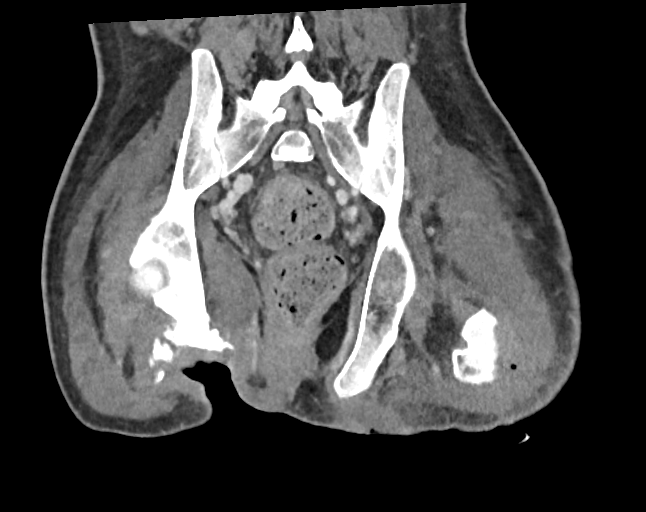
[im 47/105  soft-tissue]
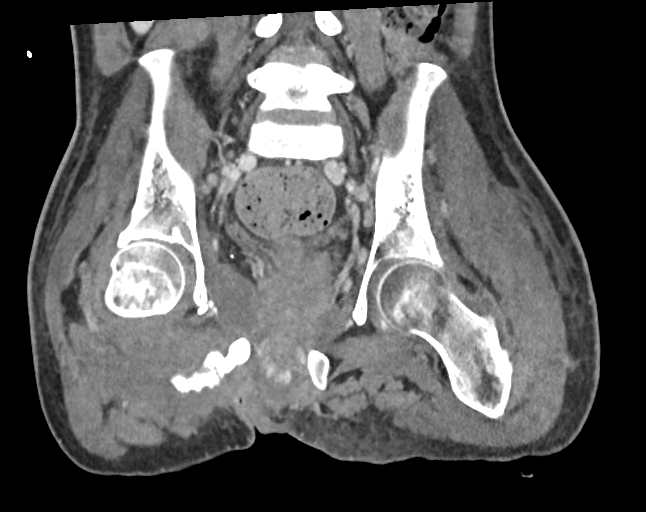
[im 58/105  soft-tissue]
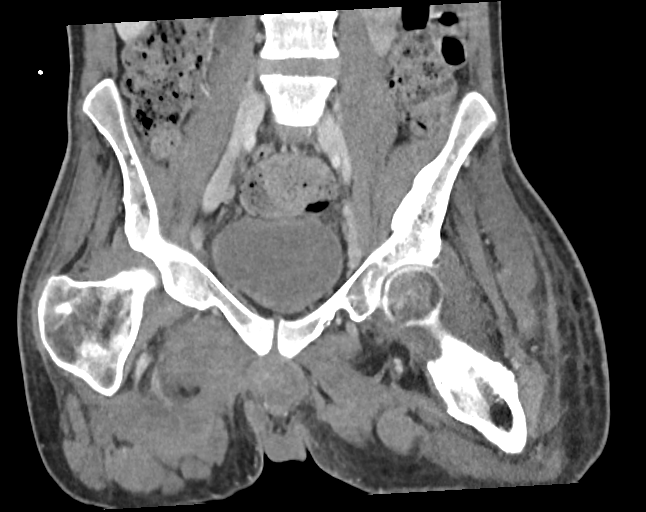

[16 of 46 positions shown; findings below may reference images not displayed]

FINDINGS: Urinary Tract: Bladder is unremarkable.

Bowel: The visualized small bowel and large bowel are normal in
caliber without abnormal wall thickening or surrounding inflammatory
changes. Stool-filled large bowel.

Reproductive: Prostate is unremarkable.

Lymphatic: Prominent groin lymph nodes appear reactive.

Vasculature: Normal appearance of the visualized arteries and veins.

Other: No abdominopelvic ascites.

Musculoskeletal: A number of large soft tissue defects are noted
overlying the right ischial tuberosity and left posterior greater
trochanter. No drainable collection identified. Prominent chronic
sclerotic changes of the right ischial tuberosity and left ilium
extending to involve the left femur.
IMPRESSION: Extensive soft tissue defects overlying the right ischial tuberosity
and left posterior greater trochanter. No associated drainable
collections.

## 2023-05-21 ENCOUNTER — Ambulatory Visit: Payer: Self-pay | Admitting: Nurse Practitioner

## 2023-06-11 ENCOUNTER — Ambulatory Visit (INDEPENDENT_AMBULATORY_CARE_PROVIDER_SITE_OTHER): Payer: 59 | Admitting: Physician Assistant

## 2023-06-11 ENCOUNTER — Encounter: Payer: Self-pay | Admitting: Physician Assistant

## 2023-06-11 VITALS — BP 118/80 | HR 70

## 2023-06-11 DIAGNOSIS — Z8711 Personal history of peptic ulcer disease: Secondary | ICD-10-CM

## 2023-06-11 DIAGNOSIS — G8220 Paraplegia, unspecified: Secondary | ICD-10-CM

## 2023-06-11 DIAGNOSIS — R109 Unspecified abdominal pain: Secondary | ICD-10-CM

## 2023-06-11 MED ORDER — PANTOPRAZOLE SODIUM 40 MG PO TBEC: 40.0000 mg | DELAYED_RELEASE_TABLET | Freq: Two times a day (BID) | ORAL | 5 refills | Status: DC
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
## 2023-06-11 NOTE — Patient Instructions (Signed)
You have been scheduled for an abdominal ultrasound at [facility]iology (1st floor of hospital) on 06/14/2023 at 10:30am. Please arrive 30 minutes prior to your appointment for registration. Make certain not to have anything to eat or drink 6 hours prior to your appointment. Should you need to reschedule your appointment, please contact radiology at [phone]. This test typically takes about 30 minutes to perform.  We have sent the following medications to your pharmacy for you to pick up at your convenience: Pantoprazole  _______________________________________________________  If your blood pressure at your visit was 140/90 or greater, please contact your primary care physician to follow up on this.  _______________________________________________________  If you are age 46 or older, your body mass index should be between 23-30. Your There is no height or weight on file to calculate BMI. If this is out of the aforementioned range listed, please consider follow up with your Primary Care Provider.  If you are age 61 or younger, your body mass index should be between 19-25. Your There is no height or weight on file to calculate BMI. If this is out of the aformentioned range listed, please consider follow up with your Primary Care Provider.   ________________________________________________________  The [facility] providers would like to encourage you to use [facility] to communicate with providers for non-urgent requests or questions.  Due to long hold times on the telephone, sending your provider a message by [facility] may be a faster and more efficient way to get a response.  Please allow 48 business hours for a response.  Please remember that this is for non-urgent requests.  _______________________________________________________ It was a pleasure to see you today!  Thank you for trusting me with your gastrointestinal care!

## 2023-06-13 NOTE — Progress Notes (Signed)
Attending Physician's Attestation   I have reviewed the chart.   I agree with the Advanced Practitioner's note, impression, and recommendations with any updates as below. Reasonable approach to restart PPI in case this is also related disease.  Agree with abdominal ultrasound to rule out microlithiasis/cholelithiasis.  Will try to add on laboratories including an iron/TIBC/ferritin to evaluate his microcytic anemia.  If he has evidence of iron deficiency, then certainly upper endoscopy to rule out persisting ulcerations of the stomach is reasonable but may also need a colonoscopy.  If no evidence of iron deficiency, then hematology evaluation will be recommended.   Corliss Parish, MD [facility] Advanced Endoscopy Office # [phone]

## 2023-06-14 ENCOUNTER — Ambulatory Visit (HOSPITAL_COMMUNITY)
Admission: RE | Admit: 2023-06-14 | Discharge: 2023-06-14 | Disposition: A | Discharge status: Home / Self Care | Payer: 59 | Source: Ambulatory Visit | Attending: Physician Assistant | Admitting: Physician Assistant

## 2023-06-14 DIAGNOSIS — R109 Unspecified abdominal pain: Secondary | ICD-10-CM | POA: Diagnosis not present

## 2023-06-14 DIAGNOSIS — Z8711 Personal history of peptic ulcer disease: Secondary | ICD-10-CM | POA: Diagnosis not present

## 2023-06-18 ENCOUNTER — Other Ambulatory Visit: Payer: Self-pay

## 2023-06-18 DIAGNOSIS — D509 Iron deficiency anemia, unspecified: Secondary | ICD-10-CM

## 2023-06-18 DIAGNOSIS — G8220 Paraplegia, unspecified: Secondary | ICD-10-CM

## 2023-06-18 DIAGNOSIS — Z8711 Personal history of peptic ulcer disease: Secondary | ICD-10-CM

## 2023-06-18 DIAGNOSIS — R109 Unspecified abdominal pain: Secondary | ICD-10-CM

## 2023-06-18 NOTE — Addendum Note (Signed)
Addended by: Missy Sabins on: 06/18/2023 01:15 PM   Modules accepted: Orders

## 2023-06-20 DIAGNOSIS — M25852 Other specified joint disorders, left hip: Secondary | ICD-10-CM | POA: Diagnosis not present

## 2023-07-02 ENCOUNTER — Encounter: Payer: 59 | Admitting: Physical Medicine and Rehabilitation

## 2023-07-02 DIAGNOSIS — G8220 Paraplegia, unspecified: Secondary | ICD-10-CM | POA: Diagnosis not present

## 2023-07-02 DIAGNOSIS — L89324 Pressure ulcer of left buttock, stage 4: Secondary | ICD-10-CM | POA: Diagnosis not present

## 2023-07-02 DIAGNOSIS — Z01818 Encounter for other preprocedural examination: Secondary | ICD-10-CM | POA: Diagnosis not present

## 2023-07-02 DIAGNOSIS — L89314 Pressure ulcer of right buttock, stage 4: Secondary | ICD-10-CM | POA: Diagnosis not present

## 2023-07-03 ENCOUNTER — Emergency Department (HOSPITAL_COMMUNITY)
Admission: EM | Admit: 2023-07-03 | Discharge: 2023-07-03 | Disposition: A | Discharge status: Home / Self Care | Payer: 59 | Attending: Emergency Medicine | Admitting: Emergency Medicine

## 2023-07-03 ENCOUNTER — Emergency Department (HOSPITAL_COMMUNITY): Payer: 59

## 2023-07-03 ENCOUNTER — Other Ambulatory Visit: Payer: Self-pay

## 2023-07-03 ENCOUNTER — Encounter (HOSPITAL_COMMUNITY): Payer: Self-pay

## 2023-07-03 DIAGNOSIS — X501XXA Overexertion from prolonged static or awkward postures, initial encounter: Secondary | ICD-10-CM | POA: Insufficient documentation

## 2023-07-03 DIAGNOSIS — S8392XA Sprain of unspecified site of left knee, initial encounter: Secondary | ICD-10-CM | POA: Diagnosis not present

## 2023-07-03 DIAGNOSIS — G8220 Paraplegia, unspecified: Secondary | ICD-10-CM | POA: Diagnosis not present

## 2023-07-03 DIAGNOSIS — M85862 Other specified disorders of bone density and structure, left lower leg: Secondary | ICD-10-CM | POA: Diagnosis not present

## 2023-07-03 DIAGNOSIS — M7989 Other specified soft tissue disorders: Secondary | ICD-10-CM | POA: Diagnosis not present

## 2023-07-03 DIAGNOSIS — M25562 Pain in left knee: Secondary | ICD-10-CM | POA: Diagnosis not present

## 2023-07-03 NOTE — Discharge Instructions (Signed)
The x-rays did not show any signs of fracture or dislocation.  Follow-up with an orthopedic doctor for further evaluation of your knee injury.
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
## 2023-07-13 DIAGNOSIS — R000 Tachycardia, unspecified: Secondary | ICD-10-CM | POA: Diagnosis not present

## 2023-07-13 DIAGNOSIS — G8220 Paraplegia, unspecified: Secondary | ICD-10-CM | POA: Diagnosis not present

## 2023-07-13 DIAGNOSIS — M2580 Other specified joint disorders, unspecified joint: Secondary | ICD-10-CM | POA: Diagnosis not present

## 2023-07-13 DIAGNOSIS — L89324 Pressure ulcer of left buttock, stage 4: Secondary | ICD-10-CM | POA: Diagnosis not present

## 2023-07-13 DIAGNOSIS — E440 Moderate protein-calorie malnutrition: Secondary | ICD-10-CM | POA: Diagnosis not present

## 2023-07-13 DIAGNOSIS — Y838 Other surgical procedures as the cause of abnormal reaction of the patient, or of later complication, without mention of misadventure at the time of the procedure: Secondary | ICD-10-CM | POA: Diagnosis not present

## 2023-07-13 DIAGNOSIS — L7632 Postprocedural hematoma of skin and subcutaneous tissue following other procedure: Secondary | ICD-10-CM | POA: Diagnosis not present

## 2023-07-13 DIAGNOSIS — R1010 Upper abdominal pain, unspecified: Secondary | ICD-10-CM | POA: Diagnosis not present

## 2023-07-13 DIAGNOSIS — Z87891 Personal history of nicotine dependence: Secondary | ICD-10-CM | POA: Diagnosis not present

## 2023-07-13 DIAGNOSIS — R079 Chest pain, unspecified: Secondary | ICD-10-CM | POA: Diagnosis not present

## 2023-07-13 DIAGNOSIS — N319 Neuromuscular dysfunction of bladder, unspecified: Secondary | ICD-10-CM | POA: Diagnosis not present

## 2023-07-13 DIAGNOSIS — L89224 Pressure ulcer of left hip, stage 4: Secondary | ICD-10-CM | POA: Diagnosis not present

## 2023-07-13 DIAGNOSIS — M61552 Other ossification of muscle, left thigh: Secondary | ICD-10-CM | POA: Diagnosis not present

## 2023-07-13 DIAGNOSIS — K592 Neurogenic bowel, not elsewhere classified: Secondary | ICD-10-CM | POA: Diagnosis not present

## 2023-07-13 DIAGNOSIS — Z5982 Transportation insecurity: Secondary | ICD-10-CM | POA: Diagnosis not present

## 2023-07-13 DIAGNOSIS — Z681 Body mass index (BMI) 19 or less, adult: Secondary | ICD-10-CM | POA: Diagnosis not present

## 2023-07-13 DIAGNOSIS — R1031 Right lower quadrant pain: Secondary | ICD-10-CM | POA: Diagnosis not present

## 2023-07-13 DIAGNOSIS — M96840 Postprocedural hematoma of a musculoskeletal structure following a musculoskeletal system procedure: Secondary | ICD-10-CM | POA: Diagnosis not present

## 2023-07-13 DIAGNOSIS — M24652 Ankylosis, left hip: Secondary | ICD-10-CM | POA: Diagnosis not present

## 2023-07-13 DIAGNOSIS — D62 Acute posthemorrhagic anemia: Secondary | ICD-10-CM | POA: Diagnosis not present

## 2023-07-13 DIAGNOSIS — Z5320 Procedure and treatment not carried out because of patient's decision for unspecified reasons: Secondary | ICD-10-CM | POA: Diagnosis not present

## 2023-07-14 DIAGNOSIS — R1010 Upper abdominal pain, unspecified: Secondary | ICD-10-CM | POA: Diagnosis not present

## 2023-07-15 DIAGNOSIS — R079 Chest pain, unspecified: Secondary | ICD-10-CM | POA: Diagnosis not present

## 2023-07-15 DIAGNOSIS — L7632 Postprocedural hematoma of skin and subcutaneous tissue following other procedure: Secondary | ICD-10-CM | POA: Diagnosis not present

## 2023-07-15 DIAGNOSIS — R000 Tachycardia, unspecified: Secondary | ICD-10-CM | POA: Diagnosis not present

## 2023-07-16 DIAGNOSIS — M2580 Other specified joint disorders, unspecified joint: Secondary | ICD-10-CM | POA: Diagnosis not present

## 2023-07-17 DIAGNOSIS — M2580 Other specified joint disorders, unspecified joint: Secondary | ICD-10-CM | POA: Diagnosis not present

## 2023-07-18 DIAGNOSIS — M2580 Other specified joint disorders, unspecified joint: Secondary | ICD-10-CM | POA: Diagnosis not present

## 2023-07-18 DIAGNOSIS — R1031 Right lower quadrant pain: Secondary | ICD-10-CM | POA: Diagnosis not present

## 2023-07-19 DIAGNOSIS — M2580 Other specified joint disorders, unspecified joint: Secondary | ICD-10-CM | POA: Diagnosis not present

## 2023-07-20 DIAGNOSIS — G8222 Paraplegia, incomplete: Secondary | ICD-10-CM | POA: Diagnosis not present

## 2023-07-20 DIAGNOSIS — R739 Hyperglycemia, unspecified: Secondary | ICD-10-CM | POA: Diagnosis not present

## 2023-07-20 DIAGNOSIS — Z741 Need for assistance with personal care: Secondary | ICD-10-CM | POA: Diagnosis not present

## 2023-07-20 DIAGNOSIS — Z0389 Encounter for observation for other suspected diseases and conditions ruled out: Secondary | ICD-10-CM | POA: Diagnosis not present

## 2023-07-20 DIAGNOSIS — Z7409 Other reduced mobility: Secondary | ICD-10-CM | POA: Diagnosis not present

## 2023-07-20 DIAGNOSIS — Z9889 Other specified postprocedural states: Secondary | ICD-10-CM | POA: Diagnosis not present

## 2023-07-20 DIAGNOSIS — D638 Anemia in other chronic diseases classified elsewhere: Secondary | ICD-10-CM | POA: Diagnosis not present

## 2023-07-20 DIAGNOSIS — Z8711 Personal history of peptic ulcer disease: Secondary | ICD-10-CM | POA: Diagnosis not present

## 2023-07-20 DIAGNOSIS — S22079S Unspecified fracture of T9-T10 vertebra, sequela: Secondary | ICD-10-CM | POA: Diagnosis not present

## 2023-07-20 DIAGNOSIS — L89324 Pressure ulcer of left buttock, stage 4: Secondary | ICD-10-CM | POA: Diagnosis not present

## 2023-07-20 DIAGNOSIS — Z681 Body mass index (BMI) 19 or less, adult: Secondary | ICD-10-CM | POA: Diagnosis not present

## 2023-07-20 DIAGNOSIS — S22089S Unspecified fracture of T11-T12 vertebra, sequela: Secondary | ICD-10-CM | POA: Diagnosis not present

## 2023-07-20 DIAGNOSIS — S22089D Unspecified fracture of T11-T12 vertebra, subsequent encounter for fracture with routine healing: Secondary | ICD-10-CM | POA: Diagnosis not present

## 2023-07-20 DIAGNOSIS — Z87828 Personal history of other (healed) physical injury and trauma: Secondary | ICD-10-CM | POA: Diagnosis not present

## 2023-07-20 DIAGNOSIS — E440 Moderate protein-calorie malnutrition: Secondary | ICD-10-CM | POA: Diagnosis not present

## 2023-07-20 DIAGNOSIS — N319 Neuromuscular dysfunction of bladder, unspecified: Secondary | ICD-10-CM | POA: Diagnosis not present

## 2023-07-20 DIAGNOSIS — M2580 Other specified joint disorders, unspecified joint: Secondary | ICD-10-CM | POA: Diagnosis not present

## 2023-07-20 DIAGNOSIS — D62 Acute posthemorrhagic anemia: Secondary | ICD-10-CM | POA: Diagnosis not present

## 2023-07-20 DIAGNOSIS — G8929 Other chronic pain: Secondary | ICD-10-CM | POA: Diagnosis not present

## 2023-07-20 DIAGNOSIS — W3400XS Accidental discharge from unspecified firearms or gun, sequela: Secondary | ICD-10-CM | POA: Diagnosis not present

## 2023-07-20 DIAGNOSIS — Z960 Presence of urogenital implants: Secondary | ICD-10-CM | POA: Diagnosis not present

## 2023-07-20 DIAGNOSIS — K592 Neurogenic bowel, not elsewhere classified: Secondary | ICD-10-CM | POA: Diagnosis not present

## 2023-07-20 DIAGNOSIS — F4310 Post-traumatic stress disorder, unspecified: Secondary | ICD-10-CM | POA: Diagnosis not present

## 2023-07-20 DIAGNOSIS — L89314 Pressure ulcer of right buttock, stage 4: Secondary | ICD-10-CM | POA: Diagnosis not present

## 2023-07-24 DIAGNOSIS — Z0389 Encounter for observation for other suspected diseases and conditions ruled out: Secondary | ICD-10-CM | POA: Diagnosis not present

## 2023-07-24 DIAGNOSIS — G8222 Paraplegia, incomplete: Secondary | ICD-10-CM | POA: Diagnosis not present

## 2023-07-25 DIAGNOSIS — G8222 Paraplegia, incomplete: Secondary | ICD-10-CM | POA: Diagnosis not present

## 2023-07-26 DIAGNOSIS — G8222 Paraplegia, incomplete: Secondary | ICD-10-CM | POA: Diagnosis not present

## 2023-08-02 ENCOUNTER — Encounter (HOSPITAL_COMMUNITY): Payer: Self-pay

## 2023-08-02 ENCOUNTER — Emergency Department (HOSPITAL_COMMUNITY): Payer: 59

## 2023-08-02 ENCOUNTER — Other Ambulatory Visit: Payer: Self-pay

## 2023-08-02 ENCOUNTER — Emergency Department (HOSPITAL_COMMUNITY)
Admission: EM | Admit: 2023-08-02 | Discharge: 2023-08-03 | Disposition: A | Discharge status: Other Acute Inpatient Hospital | Payer: 59 | Attending: Emergency Medicine | Admitting: Emergency Medicine

## 2023-08-02 DIAGNOSIS — Z20822 Contact with and (suspected) exposure to covid-19: Secondary | ICD-10-CM | POA: Diagnosis not present

## 2023-08-02 DIAGNOSIS — R509 Fever, unspecified: Secondary | ICD-10-CM | POA: Diagnosis not present

## 2023-08-02 DIAGNOSIS — A419 Sepsis, unspecified organism: Secondary | ICD-10-CM | POA: Insufficient documentation

## 2023-08-02 DIAGNOSIS — M96830 Postprocedural hemorrhage and hematoma of a musculoskeletal structure following a musculoskeletal system procedure: Secondary | ICD-10-CM | POA: Diagnosis not present

## 2023-08-02 DIAGNOSIS — M9689 Other intraoperative and postprocedural complications and disorders of the musculoskeletal system: Secondary | ICD-10-CM

## 2023-08-02 DIAGNOSIS — R000 Tachycardia, unspecified: Secondary | ICD-10-CM | POA: Diagnosis not present

## 2023-08-02 LAB — CBC WITH DIFFERENTIAL/PLATELET
Abs Immature Granulocytes: 0.15 10*3/uL — ABNORMAL HIGH (ref 0.00–0.07)
Basophils Absolute: 0.1 10*3/uL (ref 0.0–0.1)
Basophils Relative: 0 %
Eosinophils Absolute: 0 10*3/uL (ref 0.0–0.5)
Eosinophils Relative: 0 %
HCT: 32.9 % — ABNORMAL LOW (ref 39.0–52.0)
Hemoglobin: 10.1 g/dL — ABNORMAL LOW (ref 13.0–17.0)
Immature Granulocytes: 1 %
Lymphocytes Relative: 4 %
Lymphs Abs: 1 10*3/uL (ref 0.7–4.0)
MCH: 23.3 pg — ABNORMAL LOW (ref 26.0–34.0)
MCHC: 30.7 g/dL (ref 30.0–36.0)
MCV: 76 fL — ABNORMAL LOW (ref 80.0–100.0)
Monocytes Absolute: 1.4 10*3/uL — ABNORMAL HIGH (ref 0.1–1.0)
Monocytes Relative: 6 %
Neutro Abs: 21.4 10*3/uL — ABNORMAL HIGH (ref 1.7–7.7)
Neutrophils Relative %: 89 %
Platelets: 915 10*3/uL (ref 150–400)
RBC: 4.33 MIL/uL (ref 4.22–5.81)
RDW: 17.4 % — ABNORMAL HIGH (ref 11.5–15.5)
WBC: 24.1 10*3/uL — ABNORMAL HIGH (ref 4.0–10.5)
nRBC: 0 % (ref 0.0–0.2)

## 2023-08-02 LAB — COMPREHENSIVE METABOLIC PANEL
ALT: 25 U/L (ref 0–44)
AST: 25 U/L (ref 15–41)
Albumin: 3.2 g/dL — ABNORMAL LOW (ref 3.5–5.0)
Alkaline Phosphatase: 102 U/L (ref 38–126)
Anion gap: 12 (ref 5–15)
BUN: 20 mg/dL (ref 6–20)
CO2: 24 mmol/L (ref 22–32)
Calcium: 8.6 mg/dL — ABNORMAL LOW (ref 8.9–10.3)
Chloride: 98 mmol/L (ref 98–111)
Creatinine, Ser: 0.78 mg/dL (ref 0.61–1.24)
GFR, Estimated: >60 mL/min (ref >60)
Glucose, Bld: 135 mg/dL — ABNORMAL HIGH (ref 70–99)
Potassium: 4.1 mmol/L (ref 3.5–5.1)
Sodium: 134 mmol/L — ABNORMAL LOW (ref 135–145)
Total Bilirubin: 0.7 mg/dL (ref 0.3–1.2)
Total Protein: 8.2 g/dL — ABNORMAL HIGH (ref 6.5–8.1)

## 2023-08-02 LAB — RESP PANEL BY RT-PCR (RSV, FLU A&B, COVID)  RVPGX2
Influenza A by PCR: NEGATIVE
Influenza B by PCR: NEGATIVE
Resp Syncytial Virus by PCR: NEGATIVE
SARS Coronavirus 2 by RT PCR: NEGATIVE

## 2023-08-02 LAB — I-STAT CG4 LACTIC ACID, ED: Lactic Acid, Venous: 0.9 mmol/L (ref 0.5–1.9)

## 2023-08-02 LAB — PROTIME-INR
INR: 1.1 (ref 0.8–1.2)
Prothrombin Time: 14.5 s (ref 11.4–15.2)

## 2023-08-02 LAB — APTT: aPTT: 32 s (ref 24–36)

## 2023-08-02 MED ORDER — METRONIDAZOLE 500 MG/100ML IV SOLN
500.0000 mg | Freq: Once | INTRAVENOUS | Status: AC
  Administered 2023-08-02: 500 mg via INTRAVENOUS
  Filled 2023-08-02: qty 100

## 2023-08-02 MED ORDER — VANCOMYCIN HCL 1500 MG/300ML IV SOLN
1500.0000 mg | Freq: Once | INTRAVENOUS | Status: AC
  Administered 2023-08-02: 1500 mg via INTRAVENOUS
  Filled 2023-08-02: qty 300

## 2023-08-02 MED ORDER — VANCOMYCIN HCL IN DEXTROSE 1-5 GM/200ML-% IV SOLN: 1000.0000 mg | Freq: Once | INTRAVENOUS | Status: DC

## 2023-08-02 MED ORDER — LACTATED RINGERS IV BOLUS (SEPSIS)
1000.0000 mL | Freq: Once | INTRAVENOUS | Status: AC
  Administered 2023-08-02: 1000 mL via INTRAVENOUS

## 2023-08-02 MED ORDER — SODIUM CHLORIDE 0.9 % IV SOLN
2.0000 g | Freq: Once | INTRAVENOUS | Status: AC
  Administered 2023-08-02: 2 g via INTRAVENOUS
  Filled 2023-08-02: qty 12.5

## 2023-08-02 MED ORDER — LACTATED RINGERS IV SOLN: INTRAVENOUS | Status: DC

## 2023-08-02 NOTE — ED Notes (Addendum)
Spoke with Josh at [facility] dispatch to set up transport for patient due to [facility] not beign able to transport
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
## 2023-08-02 NOTE — ED Triage Notes (Signed)
Pt reports with fever and not feeling well since today. Pt reports a fever of 103.6 f at home. Pt took 2 tylenol 1 hr ago. Pt reports recently having left hip sx and has jp drains in place currently with blood and pus drainage.

## 2023-08-02 NOTE — Progress Notes (Signed)
A consult was received from an ED physician for Vancomycin and Cefepime per pharmacy dosing.  The patient's profile has been reviewed for ht/wt/allergies/indication/available labs.    A one time order has been placed for: - Vancomycin 1.5gm IV x 1 dose - Cefepime 2gm IV x 1 dose.  Further antibiotics/pharmacy consults should be ordered by admitting physician if indicated.                       Thank you, Josefa Half 08/02/2023  9:32 PM

## 2023-08-03 DIAGNOSIS — Z20822 Contact with and (suspected) exposure to covid-19: Secondary | ICD-10-CM | POA: Diagnosis not present

## 2023-08-03 DIAGNOSIS — Y929 Unspecified place or not applicable: Secondary | ICD-10-CM | POA: Diagnosis not present

## 2023-08-03 DIAGNOSIS — R509 Fever, unspecified: Secondary | ICD-10-CM | POA: Diagnosis not present

## 2023-08-03 DIAGNOSIS — R000 Tachycardia, unspecified: Secondary | ICD-10-CM | POA: Diagnosis not present

## 2023-08-03 DIAGNOSIS — R1084 Generalized abdominal pain: Secondary | ICD-10-CM | POA: Diagnosis not present

## 2023-08-03 DIAGNOSIS — Z79899 Other long term (current) drug therapy: Secondary | ICD-10-CM | POA: Diagnosis not present

## 2023-08-03 DIAGNOSIS — K5900 Constipation, unspecified: Secondary | ICD-10-CM | POA: Diagnosis not present

## 2023-08-03 DIAGNOSIS — S24103S Unspecified injury at T7-T10 level of thoracic spinal cord, sequela: Secondary | ICD-10-CM | POA: Diagnosis not present

## 2023-08-03 DIAGNOSIS — L89324 Pressure ulcer of left buttock, stage 4: Secondary | ICD-10-CM | POA: Diagnosis not present

## 2023-08-03 DIAGNOSIS — D6489 Other specified anemias: Secondary | ICD-10-CM | POA: Diagnosis not present

## 2023-08-03 DIAGNOSIS — F1290 Cannabis use, unspecified, uncomplicated: Secondary | ICD-10-CM | POA: Diagnosis not present

## 2023-08-03 DIAGNOSIS — T8142XA Infection following a procedure, deep incisional surgical site, initial encounter: Secondary | ICD-10-CM | POA: Diagnosis not present

## 2023-08-03 DIAGNOSIS — G8222 Paraplegia, incomplete: Secondary | ICD-10-CM | POA: Diagnosis not present

## 2023-08-03 DIAGNOSIS — L89159 Pressure ulcer of sacral region, unspecified stage: Secondary | ICD-10-CM | POA: Diagnosis not present

## 2023-08-03 DIAGNOSIS — A419 Sepsis, unspecified organism: Secondary | ICD-10-CM | POA: Diagnosis not present

## 2023-08-03 DIAGNOSIS — M7062 Trochanteric bursitis, left hip: Secondary | ICD-10-CM | POA: Diagnosis not present

## 2023-08-03 DIAGNOSIS — M62838 Other muscle spasm: Secondary | ICD-10-CM | POA: Diagnosis not present

## 2023-08-03 DIAGNOSIS — R0602 Shortness of breath: Secondary | ICD-10-CM | POA: Diagnosis not present

## 2023-08-03 DIAGNOSIS — W3400XS Accidental discharge from unspecified firearms or gun, sequela: Secondary | ICD-10-CM | POA: Diagnosis not present

## 2023-08-03 DIAGNOSIS — M8618 Other acute osteomyelitis, other site: Secondary | ICD-10-CM | POA: Diagnosis not present

## 2023-08-03 DIAGNOSIS — Y838 Other surgical procedures as the cause of abnormal reaction of the patient, or of later complication, without mention of misadventure at the time of the procedure: Secondary | ICD-10-CM | POA: Diagnosis not present

## 2023-08-03 DIAGNOSIS — H5040 Unspecified heterotropia: Secondary | ICD-10-CM | POA: Diagnosis not present

## 2023-08-03 DIAGNOSIS — M96830 Postprocedural hemorrhage and hematoma of a musculoskeletal structure following a musculoskeletal system procedure: Secondary | ICD-10-CM | POA: Diagnosis not present

## 2023-08-03 NOTE — ED Notes (Signed)
Asked patient about urine, said that he does not have the urge to go right now.

## 2023-08-03 NOTE — ED Notes (Signed)
Asked patient about urine again, said he does not have the urge to go and he did not want an in and out catheter.

## 2023-08-07 LAB — CULTURE, BLOOD (ROUTINE X 2)
Culture: NO GROWTH
Culture: NO GROWTH
Special Requests: ADEQUATE

## 2023-08-08 DIAGNOSIS — A419 Sepsis, unspecified organism: Secondary | ICD-10-CM | POA: Diagnosis not present

## 2023-08-09 DIAGNOSIS — M869 Osteomyelitis, unspecified: Secondary | ICD-10-CM | POA: Diagnosis not present

## 2023-08-09 DIAGNOSIS — A419 Sepsis, unspecified organism: Secondary | ICD-10-CM | POA: Diagnosis not present

## 2023-08-10 ENCOUNTER — Ambulatory Visit: Payer: Self-pay | Admitting: Nurse Practitioner

## 2023-08-10 ENCOUNTER — Encounter (HOSPITAL_COMMUNITY): Payer: Self-pay

## 2023-08-10 ENCOUNTER — Telehealth: Payer: Self-pay

## 2023-08-10 DIAGNOSIS — A419 Sepsis, unspecified organism: Secondary | ICD-10-CM | POA: Diagnosis not present

## 2023-08-10 DIAGNOSIS — M869 Osteomyelitis, unspecified: Secondary | ICD-10-CM | POA: Diagnosis not present

## 2023-08-10 NOTE — Transitions of Care (Post Inpatient/ED Visit) (Signed)
08/10/2023  Name: [name] MRN: [id number] DOB: September 04, 1991  Today's TOC FU Call Status: Today's TOC FU Call Status:: Successful TOC FU Call Completed TOC FU Call Complete Date: 08/10/23 Patient's Name and Date of Birth confirmed.  Transition Care Management Follow-up Telephone Call Date of Discharge: 08/08/23 Discharge Facility: Other [facility]) Name of Other ([facility]) Discharge Facility: [facility] Type of Discharge: Inpatient Admission Primary Inpatient Discharge Diagnosis:: Sepsis How have you been since you were released from the hospital?: Better Any questions or concerns?: No  Items Reviewed: Did you receive and understand the discharge instructions provided?: Yes Medications obtained,verified, and reconciled?: Yes (Medications Reviewed) Any new allergies since your discharge?: No Dietary orders reviewed?: No Do you have support at home?: Yes People in Home: parent(s) Name of Support/Comfort Primary Source: Hilda Lias  Medications Reviewed Today: Medications Reviewed Today     Reviewed by Jodelle Gross, RN (Case Manager) on 08/10/23 at 1424  Med List Status: <None>   Medication Order Taking? Sig Documenting Provider Last Dose Status Informant  acetaminophen (TYLENOL) 500 MG tablet [id number]  Take 1,000 mg by mouth every 6 (six) hours as needed for mild pain. [provider]  Active   baclofen (LIORESAL) 20 MG tablet [id number]  Take 2 tablets (40 mg total) by mouth 4 (four) times daily. For spasticity- is MAX dose of Baclofen for SCI Genice Rouge, MD  Active   BACLOFEN PO [id number]   [provider]  Active   bisacodyl (DULCOLAX) 10 MG suppository [id number]  Place 10 mg rectally See admin instructions. Insert 1 suppository (10 mg total) into the rectum daily Indications: Neurogenic bowel. Recommend after dinner nightly dig stim, then suppository, then repeat dig stim after 15 min. [provider]  Active   DAPTOmycin  (CUBICIN) 500 MG injection [id number] Yes Inject 7.8 mLs into the vein daily. [provider] Taking Active   Ferrous Sulfate (IRON PO) [id number] Yes Take 1 tablet by mouth daily. [provider]  Active   hydrOXYzine (ATARAX) 25 MG tablet [id number]  Take 25 mg by mouth every 8 (eight) hours as needed for anxiety. [provider]  Active   lidocaine (XYLOCAINE) 2 % solution [id number] Yes Use as directed 5 mLs in the mouth or throat every 3 (three) hours as needed for mouth pain. [provider] Taking Active   melatonin 3 MG TABS tablet [id number]  Take 3 mg by mouth at bedtime as needed (Sleep). [provider]  Active   Multiple Vitamin (MULTI-VITAMIN) tablet [id number] Yes Take 1 tablet by mouth daily. [provider] Taking Active   naloxone [facility]) nasal spray 4 mg/0.1 mL [id number]  Place 1 spray into the nose once as needed (Sedation, opioid overdose). [provider]  Active   oxyCODONE (ROXICODONE) 15 MG immediate release tablet [id number] Yes Take 15 mg by mouth every 6 (six) hours as needed for pain. [provider] Taking Active   pantoprazole (PROTONIX) 40 MG tablet [id number]  Take 1 tablet (40 mg total) by mouth 2 (two) times daily. Unk Lightning, [place]  Active   polyethylene glycol (MIRALAX / GLYCOLAX) 17 g packet [id number]  Take 17 g by mouth daily. [provider]  Active   senna-docusate (SENOKOT-S) 8.6-50 MG tablet [id number]  Take 2 tablets by mouth 2 (two) times daily. [provider]  Active   traZODone (DESYREL) 50 MG tablet [id number]  Take 25 mg by mouth at bedtime as needed for sleep. [provider]  Active   valACYclovir (VALTREX) 1000 MG tablet [id number] Yes Take 1,000 mg by mouth 2 (two) times daily. [provider] Taking Active             Home Care and Equipment/Supplies: Were Home Health Services Ordered?: Yes Name of Home Health Agency:: [facility] Has  Agency set up a time to come to your home?: Yes First Home Health Visit Date: 08/09/23 Any new equipment or medical supplies ordered?: No  Functional Questionnaire:Unable to complete with patient, poor connection   Follow up appointments reviewed: PCP Follow-up appointment confirmed?: No MD Provider Line Number:[phone] Given: No Specialist Hospital Follow-up appointment confirmed?: Yes Date of Specialist follow-up appointment?: 08/28/23 Follow-Up Specialty Provider:: Dr. Andrey Campanile [facility]) Do you need transportation to your follow-up appointment?: No Do you understand care options if your condition(s) worsen?: Yes-patient verbalized understanding  Jodelle Gross RN, BSN, CCM [facility] RN Care Coordinator/ Transitions of Care Direct Dial: [phone]  Fax: [phone]

## 2023-08-11 DIAGNOSIS — M869 Osteomyelitis, unspecified: Secondary | ICD-10-CM | POA: Diagnosis not present

## 2023-08-11 DIAGNOSIS — A419 Sepsis, unspecified organism: Secondary | ICD-10-CM | POA: Diagnosis not present

## 2023-08-12 DIAGNOSIS — A419 Sepsis, unspecified organism: Secondary | ICD-10-CM | POA: Diagnosis not present

## 2023-08-12 DIAGNOSIS — M869 Osteomyelitis, unspecified: Secondary | ICD-10-CM | POA: Diagnosis not present

## 2023-08-13 DIAGNOSIS — A419 Sepsis, unspecified organism: Secondary | ICD-10-CM | POA: Diagnosis not present

## 2023-08-13 DIAGNOSIS — M869 Osteomyelitis, unspecified: Secondary | ICD-10-CM | POA: Diagnosis not present

## 2023-08-14 DIAGNOSIS — A419 Sepsis, unspecified organism: Secondary | ICD-10-CM | POA: Diagnosis not present

## 2023-08-14 DIAGNOSIS — M869 Osteomyelitis, unspecified: Secondary | ICD-10-CM | POA: Diagnosis not present

## 2023-08-15 DIAGNOSIS — M869 Osteomyelitis, unspecified: Secondary | ICD-10-CM | POA: Diagnosis not present

## 2023-08-15 DIAGNOSIS — A419 Sepsis, unspecified organism: Secondary | ICD-10-CM | POA: Diagnosis not present

## 2023-08-16 DIAGNOSIS — Z09 Encounter for follow-up examination after completed treatment for conditions other than malignant neoplasm: Secondary | ICD-10-CM | POA: Diagnosis not present

## 2023-08-16 DIAGNOSIS — A419 Sepsis, unspecified organism: Secondary | ICD-10-CM | POA: Diagnosis not present

## 2023-08-16 DIAGNOSIS — M869 Osteomyelitis, unspecified: Secondary | ICD-10-CM | POA: Diagnosis not present

## 2023-08-17 DIAGNOSIS — A499 Bacterial infection, unspecified: Secondary | ICD-10-CM | POA: Diagnosis not present

## 2023-08-17 DIAGNOSIS — Z9889 Other specified postprocedural states: Secondary | ICD-10-CM | POA: Diagnosis not present

## 2023-08-17 DIAGNOSIS — A419 Sepsis, unspecified organism: Secondary | ICD-10-CM | POA: Diagnosis not present

## 2023-08-17 DIAGNOSIS — Z09 Encounter for follow-up examination after completed treatment for conditions other than malignant neoplasm: Secondary | ICD-10-CM | POA: Diagnosis not present

## 2023-08-17 DIAGNOSIS — M868X8 Other osteomyelitis, other site: Secondary | ICD-10-CM | POA: Diagnosis not present

## 2023-08-17 DIAGNOSIS — M869 Osteomyelitis, unspecified: Secondary | ICD-10-CM | POA: Diagnosis not present

## 2023-08-17 DIAGNOSIS — M79605 Pain in left leg: Secondary | ICD-10-CM | POA: Diagnosis not present

## 2023-08-17 DIAGNOSIS — G8929 Other chronic pain: Secondary | ICD-10-CM | POA: Diagnosis not present

## 2023-08-17 DIAGNOSIS — Z792 Long term (current) use of antibiotics: Secondary | ICD-10-CM | POA: Diagnosis not present

## 2023-08-17 DIAGNOSIS — M25552 Pain in left hip: Secondary | ICD-10-CM | POA: Diagnosis not present

## 2023-08-17 DIAGNOSIS — T8149XA Infection following a procedure, other surgical site, initial encounter: Secondary | ICD-10-CM | POA: Diagnosis not present

## 2023-08-18 DIAGNOSIS — A419 Sepsis, unspecified organism: Secondary | ICD-10-CM | POA: Diagnosis not present

## 2023-08-18 DIAGNOSIS — M869 Osteomyelitis, unspecified: Secondary | ICD-10-CM | POA: Diagnosis not present

## 2023-08-19 DIAGNOSIS — A419 Sepsis, unspecified organism: Secondary | ICD-10-CM | POA: Diagnosis not present

## 2023-08-19 DIAGNOSIS — M869 Osteomyelitis, unspecified: Secondary | ICD-10-CM | POA: Diagnosis not present

## 2023-08-20 DIAGNOSIS — M869 Osteomyelitis, unspecified: Secondary | ICD-10-CM | POA: Diagnosis not present

## 2023-08-20 DIAGNOSIS — A419 Sepsis, unspecified organism: Secondary | ICD-10-CM | POA: Diagnosis not present

## 2023-08-21 DIAGNOSIS — A419 Sepsis, unspecified organism: Secondary | ICD-10-CM | POA: Diagnosis not present

## 2023-08-21 DIAGNOSIS — M869 Osteomyelitis, unspecified: Secondary | ICD-10-CM | POA: Diagnosis not present

## 2023-08-22 DIAGNOSIS — A419 Sepsis, unspecified organism: Secondary | ICD-10-CM | POA: Diagnosis not present

## 2023-08-22 DIAGNOSIS — M869 Osteomyelitis, unspecified: Secondary | ICD-10-CM | POA: Diagnosis not present

## 2023-08-29 DIAGNOSIS — M869 Osteomyelitis, unspecified: Secondary | ICD-10-CM | POA: Diagnosis not present

## 2023-08-29 DIAGNOSIS — A419 Sepsis, unspecified organism: Secondary | ICD-10-CM | POA: Diagnosis not present

## 2023-08-31 ENCOUNTER — Encounter: Payer: 59 | Attending: Physical Medicine and Rehabilitation | Admitting: Physical Medicine and Rehabilitation

## 2023-08-31 DIAGNOSIS — M869 Osteomyelitis, unspecified: Secondary | ICD-10-CM | POA: Diagnosis not present

## 2023-08-31 DIAGNOSIS — A419 Sepsis, unspecified organism: Secondary | ICD-10-CM | POA: Diagnosis not present

## 2023-09-01 DIAGNOSIS — A419 Sepsis, unspecified organism: Secondary | ICD-10-CM | POA: Diagnosis not present

## 2023-09-01 DIAGNOSIS — M869 Osteomyelitis, unspecified: Secondary | ICD-10-CM | POA: Diagnosis not present

## 2023-09-02 DIAGNOSIS — M869 Osteomyelitis, unspecified: Secondary | ICD-10-CM | POA: Diagnosis not present

## 2023-09-02 DIAGNOSIS — A419 Sepsis, unspecified organism: Secondary | ICD-10-CM | POA: Diagnosis not present

## 2023-09-03 DIAGNOSIS — R32 Unspecified urinary incontinence: Secondary | ICD-10-CM | POA: Diagnosis not present

## 2023-09-03 DIAGNOSIS — M869 Osteomyelitis, unspecified: Secondary | ICD-10-CM | POA: Diagnosis not present

## 2023-09-03 DIAGNOSIS — A419 Sepsis, unspecified organism: Secondary | ICD-10-CM | POA: Diagnosis not present

## 2023-09-04 DIAGNOSIS — M869 Osteomyelitis, unspecified: Secondary | ICD-10-CM | POA: Diagnosis not present

## 2023-09-04 DIAGNOSIS — A419 Sepsis, unspecified organism: Secondary | ICD-10-CM | POA: Diagnosis not present

## 2023-09-05 DIAGNOSIS — M869 Osteomyelitis, unspecified: Secondary | ICD-10-CM | POA: Diagnosis not present

## 2023-09-05 DIAGNOSIS — A419 Sepsis, unspecified organism: Secondary | ICD-10-CM | POA: Diagnosis not present

## 2023-09-06 DIAGNOSIS — A419 Sepsis, unspecified organism: Secondary | ICD-10-CM | POA: Diagnosis not present

## 2023-09-06 DIAGNOSIS — M869 Osteomyelitis, unspecified: Secondary | ICD-10-CM | POA: Diagnosis not present

## 2023-09-08 DIAGNOSIS — M869 Osteomyelitis, unspecified: Secondary | ICD-10-CM | POA: Diagnosis not present

## 2023-09-08 DIAGNOSIS — A419 Sepsis, unspecified organism: Secondary | ICD-10-CM | POA: Diagnosis not present

## 2023-09-09 DIAGNOSIS — M869 Osteomyelitis, unspecified: Secondary | ICD-10-CM | POA: Diagnosis not present

## 2023-09-09 DIAGNOSIS — A419 Sepsis, unspecified organism: Secondary | ICD-10-CM | POA: Diagnosis not present

## 2023-09-10 DIAGNOSIS — M869 Osteomyelitis, unspecified: Secondary | ICD-10-CM | POA: Diagnosis not present

## 2023-09-10 DIAGNOSIS — A419 Sepsis, unspecified organism: Secondary | ICD-10-CM | POA: Diagnosis not present

## 2023-09-11 DIAGNOSIS — A419 Sepsis, unspecified organism: Secondary | ICD-10-CM | POA: Diagnosis not present

## 2023-09-11 DIAGNOSIS — M869 Osteomyelitis, unspecified: Secondary | ICD-10-CM | POA: Diagnosis not present

## 2023-09-12 DIAGNOSIS — A419 Sepsis, unspecified organism: Secondary | ICD-10-CM | POA: Diagnosis not present

## 2023-09-12 DIAGNOSIS — M869 Osteomyelitis, unspecified: Secondary | ICD-10-CM | POA: Diagnosis not present

## 2023-09-13 DIAGNOSIS — M869 Osteomyelitis, unspecified: Secondary | ICD-10-CM | POA: Diagnosis not present

## 2023-09-13 DIAGNOSIS — A419 Sepsis, unspecified organism: Secondary | ICD-10-CM | POA: Diagnosis not present

## 2023-09-14 DIAGNOSIS — Z9889 Other specified postprocedural states: Secondary | ICD-10-CM | POA: Diagnosis not present

## 2023-09-14 DIAGNOSIS — T8149XA Infection following a procedure, other surgical site, initial encounter: Secondary | ICD-10-CM | POA: Diagnosis not present

## 2023-09-14 DIAGNOSIS — A499 Bacterial infection, unspecified: Secondary | ICD-10-CM | POA: Diagnosis not present

## 2023-09-14 DIAGNOSIS — Z792 Long term (current) use of antibiotics: Secondary | ICD-10-CM | POA: Diagnosis not present

## 2023-09-14 DIAGNOSIS — M8660 Other chronic osteomyelitis, unspecified site: Secondary | ICD-10-CM | POA: Diagnosis not present

## 2023-09-14 DIAGNOSIS — M868X8 Other osteomyelitis, other site: Secondary | ICD-10-CM | POA: Diagnosis not present

## 2023-10-03 DIAGNOSIS — R32 Unspecified urinary incontinence: Secondary | ICD-10-CM | POA: Diagnosis not present

## 2023-11-07 DIAGNOSIS — R32 Unspecified urinary incontinence: Secondary | ICD-10-CM | POA: Diagnosis not present

## 2023-11-18 ENCOUNTER — Emergency Department (HOSPITAL_COMMUNITY): Payer: 59

## 2023-11-18 ENCOUNTER — Emergency Department (HOSPITAL_COMMUNITY)
Admission: EM | Admit: 2023-11-18 | Discharge: 2023-11-18 | Disposition: A | Discharge status: Home / Self Care | Payer: 59 | Attending: Emergency Medicine | Admitting: Emergency Medicine

## 2023-11-18 DIAGNOSIS — X58XXXA Exposure to other specified factors, initial encounter: Secondary | ICD-10-CM | POA: Insufficient documentation

## 2023-11-18 DIAGNOSIS — D72829 Elevated white blood cell count, unspecified: Secondary | ICD-10-CM | POA: Diagnosis not present

## 2023-11-18 DIAGNOSIS — B999 Unspecified infectious disease: Secondary | ICD-10-CM | POA: Diagnosis not present

## 2023-11-18 DIAGNOSIS — D649 Anemia, unspecified: Secondary | ICD-10-CM | POA: Diagnosis not present

## 2023-11-18 DIAGNOSIS — L03317 Cellulitis of buttock: Secondary | ICD-10-CM

## 2023-11-18 DIAGNOSIS — S31829A Unspecified open wound of left buttock, initial encounter: Secondary | ICD-10-CM | POA: Insufficient documentation

## 2023-11-18 DIAGNOSIS — T888XXA Other specified complications of surgical and medical care, not elsewhere classified, initial encounter: Secondary | ICD-10-CM

## 2023-11-18 DIAGNOSIS — S3991XA Unspecified injury of abdomen, initial encounter: Secondary | ICD-10-CM | POA: Diagnosis not present

## 2023-11-18 DIAGNOSIS — K5900 Constipation, unspecified: Secondary | ICD-10-CM | POA: Diagnosis not present

## 2023-11-18 LAB — CBC WITH DIFFERENTIAL/PLATELET
Abs Immature Granulocytes: 0 10*3/uL (ref 0.00–0.07)
Basophils Absolute: 0.2 10*3/uL — ABNORMAL HIGH (ref 0.0–0.1)
Basophils Relative: 3 %
Eosinophils Absolute: 0.2 10*3/uL (ref 0.0–0.5)
Eosinophils Relative: 3 %
HCT: 36.1 % — ABNORMAL LOW (ref 39.0–52.0)
Hemoglobin: 10.5 g/dL — ABNORMAL LOW (ref 13.0–17.0)
Lymphocytes Relative: 26 %
Lymphs Abs: 1.6 10*3/uL (ref 0.7–4.0)
MCH: 19.8 pg — ABNORMAL LOW (ref 26.0–34.0)
MCHC: 29.1 g/dL — ABNORMAL LOW (ref 30.0–36.0)
MCV: 68 fL — ABNORMAL LOW (ref 80.0–100.0)
Monocytes Absolute: 0.2 10*3/uL (ref 0.1–1.0)
Monocytes Relative: 3 %
Neutro Abs: 3.9 10*3/uL (ref 1.7–7.7)
Neutrophils Relative %: 65 %
Platelets: 510 10*3/uL — ABNORMAL HIGH (ref 150–400)
RBC: 5.31 MIL/uL (ref 4.22–5.81)
RDW: 19.9 % — ABNORMAL HIGH (ref 11.5–15.5)
WBC: 6 10*3/uL (ref 4.0–10.5)
nRBC: 0 % (ref 0.0–0.2)
nRBC: 0 /100{WBCs}

## 2023-11-18 LAB — COMPREHENSIVE METABOLIC PANEL
ALT: 14 U/L (ref 0–44)
AST: 14 U/L — ABNORMAL LOW (ref 15–41)
Albumin: 2.9 g/dL — ABNORMAL LOW (ref 3.5–5.0)
Alkaline Phosphatase: 95 U/L (ref 38–126)
Anion gap: 9 (ref 5–15)
BUN: 10 mg/dL (ref 6–20)
CO2: 24 mmol/L (ref 22–32)
Calcium: 8.5 mg/dL — ABNORMAL LOW (ref 8.9–10.3)
Chloride: 104 mmol/L (ref 98–111)
Creatinine, Ser: 0.74 mg/dL (ref 0.61–1.24)
GFR, Estimated: >60 mL/min (ref >60)
Glucose, Bld: 85 mg/dL (ref 70–99)
Potassium: 4.1 mmol/L (ref 3.5–5.1)
Sodium: 137 mmol/L (ref 135–145)
Total Bilirubin: <0.2 mg/dL (ref <1.2)
Total Protein: 7.6 g/dL (ref 6.5–8.1)

## 2023-11-18 LAB — I-STAT CG4 LACTIC ACID, ED
Lactic Acid, Venous: <0.3 mmol/L — ABNORMAL LOW (ref 0.5–1.9)
Lactic Acid, Venous: 0.6 mmol/L (ref 0.5–1.9)

## 2023-11-18 MED ORDER — DOXYCYCLINE HYCLATE 100 MG PO TABS
100.0000 mg | ORAL_TABLET | Freq: Once | ORAL | Status: AC
  Administered 2023-11-18: 100 mg via ORAL
  Filled 2023-11-18: qty 1

## 2023-11-18 MED ORDER — OXYCODONE-ACETAMINOPHEN 5-325 MG PO TABS
1.0000 | ORAL_TABLET | Freq: Once | ORAL | Status: AC
  Administered 2023-11-18: 1 via ORAL
  Filled 2023-11-18: qty 1

## 2023-11-18 MED ORDER — IOHEXOL 350 MG/ML SOLN
75.0000 mL | Freq: Once | INTRAVENOUS | Status: AC | PRN
  Administered 2023-11-18: 75 mL via INTRAVENOUS

## 2023-11-18 MED ORDER — AMOXICILLIN-POT CLAVULANATE 875-125 MG PO TABS: 1.0000 | ORAL_TABLET | Freq: Two times a day (BID) | ORAL | 0 refills | Status: DC

## 2023-11-18 MED ORDER — AMOXICILLIN-POT CLAVULANATE 875-125 MG PO TABS
1.0000 | ORAL_TABLET | Freq: Once | ORAL | Status: AC
  Administered 2023-11-18: 1 via ORAL
  Filled 2023-11-18: qty 1

## 2023-11-18 MED ORDER — DOXYCYCLINE HYCLATE 100 MG PO CAPS: 100.0000 mg | ORAL_CAPSULE | Freq: Two times a day (BID) | ORAL | 0 refills | Status: DC

## 2023-11-18 MED ORDER — OXYCODONE HCL 5 MG PO TABS: 5.0000 mg | ORAL_TABLET | Freq: Four times a day (QID) | ORAL | 0 refills | Status: DC | PRN

## 2023-11-18 NOTE — ED Notes (Signed)
RN informed pt he cannot have anything to drink but he can have a sponge dipped in water. Pt declined

## 2023-11-18 NOTE — Discharge Instructions (Signed)
Your CT shows a fluid collection in your anterior hip at the site of the surgery from August.  This is something that we will need further evaluation.  We cannot entirely rule out that it is an infection at this time.  You have decided that you want to go home instead of being transferred to be seen by your orthopedic team at [facility].  Please return to the emergency department or go to [facility] if you change your mind, develop a fever, vomiting, new or worsening symptoms.  Please take antibiotic and pain medication as prescribed.  Appropriate outpatient follow-up in the next few days will be very important to help ensure that you do not get worse.

## 2023-11-18 NOTE — ED Notes (Signed)
RN attempted IV twice without success.

## 2023-11-18 NOTE — ED Notes (Signed)
RN informed CT pt has an IV placed

## 2023-11-18 NOTE — ED Triage Notes (Signed)
Pt reports having chronic wound to L buttock that has begun having purulent, malodorous drainage. Pt denies fevers. States that he is worried it could be infected. Pt also has wound to R buttock and L greater toe.
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
## 2023-11-27 DIAGNOSIS — L89324 Pressure ulcer of left buttock, stage 4: Secondary | ICD-10-CM | POA: Diagnosis not present

## 2023-11-27 DIAGNOSIS — L89314 Pressure ulcer of right buttock, stage 4: Secondary | ICD-10-CM | POA: Diagnosis not present

## 2023-11-27 DIAGNOSIS — M009 Pyogenic arthritis, unspecified: Secondary | ICD-10-CM | POA: Diagnosis not present

## 2023-12-03 ENCOUNTER — Encounter: Payer: 59 | Attending: Physical Medicine and Rehabilitation | Admitting: Physical Medicine and Rehabilitation

## 2023-12-12 DIAGNOSIS — R32 Unspecified urinary incontinence: Secondary | ICD-10-CM | POA: Diagnosis not present

## 2023-12-18 ENCOUNTER — Telehealth: Payer: Self-pay | Admitting: Physical Medicine and Rehabilitation

## 2023-12-18 MED ORDER — BACLOFEN 20 MG PO TABS: 40.0000 mg | ORAL_TABLET | Freq: Four times a day (QID) | ORAL | 1 refills | Status: DC

## 2023-12-18 NOTE — Telephone Encounter (Signed)
Patient missed last appointment. I have scheduled him for first double appt 02/29/24. He needs refills on Baclofen.He will be out Thursday.

## 2023-12-24 ENCOUNTER — Other Ambulatory Visit: Payer: Self-pay | Admitting: Physical Medicine and Rehabilitation

## 2024-02-29 ENCOUNTER — Encounter: Payer: Self-pay | Admitting: Physical Medicine and Rehabilitation

## 2024-02-29 ENCOUNTER — Encounter: Payer: MEDICAID | Attending: Physical Medicine and Rehabilitation | Admitting: Physical Medicine and Rehabilitation

## 2024-02-29 VITALS — BP 129/83 | HR 60 | Ht 72.0 in | Wt 125.0 lb

## 2024-02-29 DIAGNOSIS — W3400XA Accidental discharge from unspecified firearms or gun, initial encounter: Secondary | ICD-10-CM

## 2024-02-29 DIAGNOSIS — Z79899 Other long term (current) drug therapy: Secondary | ICD-10-CM | POA: Diagnosis not present

## 2024-02-29 DIAGNOSIS — K592 Neurogenic bowel, not elsewhere classified: Secondary | ICD-10-CM | POA: Insufficient documentation

## 2024-02-29 DIAGNOSIS — L89314 Pressure ulcer of right buttock, stage 4: Secondary | ICD-10-CM | POA: Insufficient documentation

## 2024-02-29 DIAGNOSIS — S24109S Unspecified injury at unspecified level of thoracic spinal cord, sequela: Secondary | ICD-10-CM | POA: Diagnosis not present

## 2024-02-29 DIAGNOSIS — E440 Moderate protein-calorie malnutrition: Secondary | ICD-10-CM | POA: Insufficient documentation

## 2024-02-29 DIAGNOSIS — N318 Other neuromuscular dysfunction of bladder: Secondary | ICD-10-CM | POA: Insufficient documentation

## 2024-02-29 DIAGNOSIS — L89324 Pressure ulcer of left buttock, stage 4: Secondary | ICD-10-CM | POA: Diagnosis not present

## 2024-02-29 DIAGNOSIS — Z681 Body mass index (BMI) 19 or less, adult: Secondary | ICD-10-CM | POA: Insufficient documentation

## 2024-02-29 DIAGNOSIS — K5909 Other constipation: Secondary | ICD-10-CM | POA: Insufficient documentation

## 2024-02-29 DIAGNOSIS — Z993 Dependence on wheelchair: Secondary | ICD-10-CM | POA: Insufficient documentation

## 2024-02-29 DIAGNOSIS — W3400XS Accidental discharge from unspecified firearms or gun, sequela: Secondary | ICD-10-CM | POA: Insufficient documentation

## 2024-02-29 DIAGNOSIS — M792 Neuralgia and neuritis, unspecified: Secondary | ICD-10-CM | POA: Diagnosis not present

## 2024-02-29 DIAGNOSIS — R200 Anesthesia of skin: Secondary | ICD-10-CM | POA: Insufficient documentation

## 2024-02-29 DIAGNOSIS — R252 Cramp and spasm: Secondary | ICD-10-CM | POA: Insufficient documentation

## 2024-02-29 DIAGNOSIS — G8220 Paraplegia, unspecified: Secondary | ICD-10-CM | POA: Insufficient documentation

## 2024-02-29 DIAGNOSIS — Z87440 Personal history of urinary (tract) infections: Secondary | ICD-10-CM | POA: Diagnosis not present

## 2024-02-29 MED ORDER — BACLOFEN 20 MG PO TABS: 40.0000 mg | ORAL_TABLET | Freq: Four times a day (QID) | ORAL | 1 refills | Status: DC

## 2024-02-29 MED ORDER — GABAPENTIN 300 MG PO CAPS: 300.0000 mg | ORAL_CAPSULE | Freq: Three times a day (TID) | ORAL | 5 refills | Status: AC
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
## 2024-05-22 ENCOUNTER — Telehealth: Payer: Self-pay | Admitting: *Deleted

## 2024-05-22 NOTE — Transitions of Care (Post Inpatient/ED Visit) (Signed)
 05/22/2024  Name: [name] MRN: [id number] DOB: [birth year]  Today's TOC FU Call Status: Today's TOC FU Call Status:: Successful TOC FU Call Completed TOC FU Call Complete Date: 05/22/24 Patient's Name and Date of Birth confirmed.  Transition Care Management Follow-up Telephone Call Date of Discharge: 05/21/24 Discharge Facility: Other ([facility]) Name of Other ([facility]) Discharge Facility: [facility] Type of Discharge: Inpatient Admission Primary Inpatient Discharge Diagnosis:: planned plastic surgery for (L) LE hip flap elevation- stage 4 hip pressure injury How have you been since you were released from the hospital?: Better (I am fine, no problems.  I have not seen my new PCP yet, I don't go until late July.  I take care of myself, by myself and live alone.  I don't have any concerns.  I don't understand why you are calling when I have not even seen the new doctor yet) Any questions or concerns?: No  Explained purpose of TOC call to patient; care coordination outreach completed to [facility] plan: eventually successful:  Required calls x 2- first call with Garrie was immediately dropped; second call spoke with Shenita successfully- she will provide information to trillium RN CM; she provided call reference number: [phone] lengthy call time   Items Reviewed: Did you receive and understand the discharge instructions provided?: Yes (patient declined review of outside hospital discharge instructions; states they went over everything with me and I know what to do) Medications obtained,verified, and reconciled?: No Medications Not Reviewed Reasons:: Other: (Patient declined; reports self-manages medications and denies questions/ concerns around medications today) Any new allergies since your discharge?: No Dietary orders reviewed?: No (Patient declined) Do you have support at home?: Yes People in Home [RPT]: alone Name of Support/Comfort  Primary Source: Reports resides alone in apartment/ independent in self-care activities; supportive friend assists as/ if needed/ indicated  Medications Reviewed Today: Medications Reviewed Today     Reviewed by Magic Mohler M, RN (Registered Nurse) on 05/22/24 at 1018  Med List Status: <None>   Medication Order Taking? Sig Documenting Provider Last Dose Status Informant  acetaminophen  (TYLENOL ) 500 MG tablet [id number] No Take 1,000 mg by mouth every 6 (six) hours as needed for mild pain. [provider] Taking Active   amoxicillin -clavulanate (AUGMENTIN ) 875-125 MG tablet [id number] No Take 1 tablet by mouth every 12 (twelve) hours. [name] Chew, PA-C Taking Active   baclofen  (LIORESAL ) 20 MG tablet [id number]  Take 2 tablets (40 mg total) by mouth 4 (four) times daily. For spasticity- is MAX dose of Baclofen  for SCI Lovorn, Megan, MD  Active   bisacodyl  (DULCOLAX) 10 MG suppository [id number] No Place 10 mg rectally See admin instructions. Insert 1 suppository (10 mg total) into the rectum daily Indications: Neurogenic bowel. Recommend after dinner nightly dig stim, then suppository, then repeat dig stim after 15 min. [provider] Taking Active   doxycycline  (VIBRAMYCIN ) 100 MG capsule [id number] No Take 1 capsule (100 mg total) by mouth 2 (two) times daily. [name] Chew, PA-C Taking Active   Ferrous Sulfate  (IRON PO) [id number] No Take 1 tablet by mouth daily. [provider] Taking Active   gabapentin  (NEURONTIN ) 300 MG capsule [id number]  Take 1 capsule (300 mg total) by mouth 3 (three) times daily. X 1 week, then increase to 600 mg 3x/day- for nerve pain Lovorn, Megan, MD  Active   hydrOXYzine  (ATARAX ) 25 MG tablet [id number] No Take 25 mg by mouth every 8 (eight) hours as needed for anxiety. [provider]  Taking Active   lidocaine  (XYLOCAINE ) 2 % solution [id number] No Use as directed 5 mLs in the mouth or throat every 3 (three) hours as needed for mouth  pain. [provider] Taking Active   Multiple Vitamin (MULTI-VITAMIN) tablet [id number] No Take 1 tablet by mouth daily. [provider] Taking Active   oxyCODONE  (OXY IR/ROXICODONE ) 5 MG immediate release tablet [id number] No Take 1 tablet (5 mg total) by mouth every 6 (six) hours as needed for severe pain (pain score 7-10).  Patient not taking: Reported on 02/29/2024   [name] Chew, PA-C Not Taking Active            Med Note ANICE, [name] M   Fri Feb 29, 2024  9:31 AM) Last taken last week.  pantoprazole  (PROTONIX ) 40 MG tablet [id number] No Take 1 tablet (40 mg total) by mouth 2 (two) times daily. Beather Delon Gibson, [place] Taking Active   polyethylene glycol (MIRALAX  / GLYCOLAX ) 17 g packet [id number] No Take 17 g by mouth daily. [provider] Taking Active   senna-docusate (SENOKOT-S) 8.6-50 MG tablet [id number] No Take 2 tablets by mouth 2 (two) times daily. [provider] Taking Active   traZODone  (DESYREL ) 50 MG tablet [id number] No Take 25 mg by mouth at bedtime as needed for sleep. [provider] Taking Active   valACYclovir (VALTREX) 1000 MG tablet [id number] No Take 1,000 mg by mouth 2 (two) times daily. [provider] Taking Active            Home Care and Equipment/Supplies: Were Home Health Services Ordered?: No Any new equipment or medical supplies ordered?: No  Functional Questionnaire: Do you need assistance with bathing/showering or dressing?: No Do you need assistance with meal preparation?: No Do you need assistance with eating?: No Do you have difficulty maintaining continence: No Do you need assistance with getting out of bed/getting out of a chair/moving?: No Do you have difficulty managing or taking your medications?: No  Follow up appointments reviewed: PCP Follow-up appointment confirmed?: No (patient confirms he is not currently established with PCP; confirm,s he has office visit to establish care with  Landy Stains PCP on 06/24/24: reports he plans to attend as scheduled) MD Provider Line [phone] Given: No Specialist Hospital Follow-up appointment confirmed?: Yes Date of Specialist follow-up appointment?:  (Patient reports he has scheduled post-op visit with [facility] surgical provider; states I don't remember the exact date, but I plan on going) Follow-Up Specialty Provider:: Surgical provider- [facility] Do you need transportation to your follow-up appointment?: No Do you understand care options if your condition(s) worsen?: Yes-patient verbalized understanding  SDOH Interventions Today    Flowsheet Row Most Recent Value  SDOH Interventions   Food Insecurity Interventions Intervention Not Indicated  [Today, patient denies food insecurity]  Housing Interventions Intervention Not Indicated  [Today, patient denies housing concerns]  Transportation Interventions Intervention Not Indicated  [Today, patient denies transportation needs: reports has supportive friend that consistently provides transportation]  Utilities Interventions Intervention Not Indicated   See TOC assessment tabs for additional assessment/ TOC intervention information  Pls call/ message for questions,  Jaydan Chretien Mckinney Scottlynn Lindell, RN, BSN, CCRN Alumnus RN Care Manager  Transitions of Care  [facility] Population [facility]  [place] [phone]: direct office
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
## 2024-06-24 ENCOUNTER — Encounter: Payer: Self-pay | Admitting: Family Medicine

## 2024-06-24 ENCOUNTER — Encounter: Payer: MEDICAID | Admitting: Family Medicine

## 2024-06-24 ENCOUNTER — Telehealth: Payer: Self-pay

## 2024-06-24 ENCOUNTER — Ambulatory Visit (INDEPENDENT_AMBULATORY_CARE_PROVIDER_SITE_OTHER): Payer: MEDICAID | Admitting: Family Medicine

## 2024-06-24 VITALS — BP 114/70 | HR 91 | Temp 98.6°F | Ht 72.0 in

## 2024-06-24 DIAGNOSIS — S24109D Unspecified injury at unspecified level of thoracic spinal cord, subsequent encounter: Secondary | ICD-10-CM | POA: Diagnosis not present

## 2024-06-24 DIAGNOSIS — N319 Neuromuscular dysfunction of bladder, unspecified: Secondary | ICD-10-CM

## 2024-06-24 DIAGNOSIS — G8929 Other chronic pain: Secondary | ICD-10-CM

## 2024-06-24 DIAGNOSIS — G8220 Paraplegia, unspecified: Secondary | ICD-10-CM | POA: Diagnosis not present

## 2024-06-24 DIAGNOSIS — T8189XD Other complications of procedures, not elsewhere classified, subsequent encounter: Secondary | ICD-10-CM

## 2024-06-24 DIAGNOSIS — M8660 Other chronic osteomyelitis, unspecified site: Secondary | ICD-10-CM | POA: Diagnosis not present

## 2024-06-24 DIAGNOSIS — R252 Cramp and spasm: Secondary | ICD-10-CM

## 2024-06-24 DIAGNOSIS — L89300 Pressure ulcer of unspecified buttock, unstageable: Secondary | ICD-10-CM

## 2024-06-24 DIAGNOSIS — D638 Anemia in other chronic diseases classified elsewhere: Secondary | ICD-10-CM

## 2024-06-24 DIAGNOSIS — S31000D Unspecified open wound of lower back and pelvis without penetration into retroperitoneum, subsequent encounter: Secondary | ICD-10-CM

## 2024-06-24 DIAGNOSIS — E440 Moderate protein-calorie malnutrition: Secondary | ICD-10-CM

## 2024-06-24 DIAGNOSIS — Z993 Dependence on wheelchair: Secondary | ICD-10-CM

## 2024-06-24 DIAGNOSIS — B999 Unspecified infectious disease: Secondary | ICD-10-CM

## 2024-06-24 DIAGNOSIS — M25552 Pain in left hip: Secondary | ICD-10-CM

## 2024-06-24 DIAGNOSIS — W3400XA Accidental discharge from unspecified firearms or gun, initial encounter: Secondary | ICD-10-CM

## 2024-06-24 DIAGNOSIS — K592 Neurogenic bowel, not elsewhere classified: Secondary | ICD-10-CM

## 2024-06-24 DIAGNOSIS — L89314 Pressure ulcer of right buttock, stage 4: Secondary | ICD-10-CM

## 2024-06-24 MED ORDER — OXYCODONE HCL 5 MG PO CAPS: 5.0000 mg | ORAL_CAPSULE | ORAL | 0 refills | Status: DC | PRN

## 2024-06-24 NOTE — Telephone Encounter (Signed)
 Pharmacy Patient Advocate Encounter   Received notification from CoverMyMeds that prior authorization for oxyCODONE  HCl 5MG  capsulesis required/requested.   Insurance verification completed.   The patient is insured through [facility] .   Per test claim: PA required; PA started via CoverMyMeds. KEY [id number] . Waiting for clinical questions to populate.

## 2024-06-24 NOTE — Patient Instructions (Signed)
 Welcome to [facility]!  Thank you for choosing us  for your Primary Care needs.   We offer in person and video appointments for your convenience. You may call our office to schedule appointments, or you may schedule appointments with me through MyChart.   The best way to get in contact with me is via MyChart message. This will get to me faster than a phone call, unless there is an emergency, then please call 911.  The lab is located downstairs in the Sports Medicine building, we also have xray available there.   Home health, PT/OT referrals today.  Follow up with me in one month for evaluation of medication effectiveness.
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
## 2024-06-25 NOTE — Telephone Encounter (Signed)
 Clinical questions answered.

## 2024-06-26 DIAGNOSIS — T8189XA Other complications of procedures, not elsewhere classified, initial encounter: Secondary | ICD-10-CM | POA: Insufficient documentation

## 2024-06-26 NOTE — Telephone Encounter (Signed)
 Pharmacy Patient Advocate Encounter  Received notification from [facility] that Prior Authorization for Oxycodone  5mg  caps has been DENIED.  Full denial letter will be uploaded to the media tab. See denial reason below.   PA #/Case ID/Reference #: [id number]

## 2024-06-27 ENCOUNTER — Telehealth: Payer: Self-pay | Admitting: *Deleted

## 2024-06-27 NOTE — Progress Notes (Signed)
 Complex Care Management Note  Care Guide Note 06/27/2024 Name: [name] MRN: [id number] DOB: 02/28/1991  [name] is a 33 y.o. year old male who sees Alvia Corean LITTIE, FNP for primary care. I reached out to [employer] by phone today to offer complex care management services.  [name] was given information about Complex Care Management services today including:   The Complex Care Management services include support from the care team which includes your Nurse Care Manager, Clinical Social Worker, or Pharmacist.  The Complex Care Management team is here to help remove barriers to the health concerns and goals most important to you. Complex Care Management services are voluntary, and the patient may decline or stop services at any time by request to their care team member.   Complex Care Management Consent Status: Patient agreed to services and verbal consent obtained.   Follow up plan:  Telephone appointment with complex care management team member scheduled for:  07/03/2024  Encounter Outcome:  Patient Scheduled  Thedford Franks, CMA [facility], [facility] Inpatient Guide Direct Dial: [phone]  Fax: [phone] Website: [email or web address]

## 2024-07-03 ENCOUNTER — Other Ambulatory Visit: Payer: MEDICAID

## 2024-07-03 DIAGNOSIS — G8220 Paraplegia, unspecified: Secondary | ICD-10-CM

## 2024-07-03 NOTE — Patient Outreach (Signed)
 Complex Care Management   Visit Note  07/03/2024  Name:  [name] MRN: [id number] DOB: 1990-12-01  Situation: RNCM called [employer] to follow up regarding home health PT/OT coverage. Spoke with Ryann with the provider support service line, who transferred [facility] to the department that handles the physical side Reference [phone] to consult with [facility] health. Ryann was unable to reach someone in that department and recommended RNCM call tomorrow.  Follow Up Plan:   RNCM will continue to follow  Stephaie Dardis, RN, MSN, BSN, CCM [place]  [facility], Population Health Case Manager Phone: [phone]
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
## 2024-07-03 NOTE — Patient Instructions (Signed)
 Visit Information  Thank you for taking time to visit with me today.   Tailored Plan Medicaid On May 28, 2023 some people on [place] Medicaid will move to a new kind of Medicaid health plan called a Tailored Plan. Tailored Plans cover your doctor visits, prescription drugs, and health care services.    If your [place] Medicaid will move to a Tailored Plan, you should have gotten a letter and welcome packet. If you're not sure, call your  Medicaid Enrollment Broker at [phone] and ask.  Check out these free materials, in [place] and [place], to learn more about your Tailored Plan: [email or web address]/Tailored-Plans/Toolkit  Tailored Care Management Services  TCM services are available to you now. If you are a Tailored Plan member or will be and want information about Tailored Care Management Services including rides to appointments and community and home services, call the Care Management provider for your county of residence:    [facility] [place], [place])  Member Services: [phone] Behavioral Health Crisis Line: [phone], [place], [place], [place], [place])  Member Services: 949-200-4064 Behavioral Health Crisis Line: (531)063-5907     Please call the Suicide and Crisis Lifeline: 988 call the [place]  National Suicide Prevention Lifeline: (808)357-1071 or TTY: 445 527 7968 TTY (830) 846-8265) to talk to a trained counselor call 1-800-273-TALK (toll free, 24 hour hotline) if you are experiencing a Mental Health or Behavioral Health Crisis or need someone to talk to.  Patient verbalizes understanding of instructions and care plan provided today and agrees to view in MyChart. Active MyChart status and patient understanding of how to access instructions and care plan via MyChart confirmed with patient.     Heddy Shutter, RN, MSN, BSN, CCM   [facility], Population Health Case Manager Phone: [phone]

## 2024-07-03 NOTE — Patient Outreach (Signed)
 Complex Care Management   Visit Note  07/03/2024  Name:  [name] MRN: [id number] DOB: 19-Jun-1991  Situation: Referral place for home health PT/OT per review of chart- RNCM called [facility] to see if patient has a CM with [facility] to assist with [facility] service needs for patient. Was informed that [facility] recovery services is the CM services that would manage patient. However, per [facility] extender), patient has not consented to work with their CM team and is not active with a [facility] CM. After RNCM explained patient's need for [facility] PT/OT. Dean recommended RNCM reach backout to [facility] to speak with someone who is directly over the department to manages that department.   Follow Up Plan:   RNCM will discuss with patient CM services with [facility] and attempt to get patient connected with a CM    Heddy Shutter, RN, MSN, BSN, CCM [place]  [facility], Population Health Case Manager Phone: [phone]

## 2024-07-04 NOTE — Patient Outreach (Unsigned)
 Complex Care Management   Visit Note  07/04/2024  Name:  [name] MRN: [id number] DOB: [birth year]  Situation: Care Coordination: RNCM called [facility] Provider support services [phone]. Was transferred to [facility] with member services: per [facility], Care management is responsible for assisting patient with finding services. She is unable to screen out outpatient vs home health providers. Provided link to organizations and outpatient rehab organizations  Follow Up Plan:   RNCM will call [employer] with member on the line at scheduled call on 07/07/24.   Heddy Shutter, RN, MSN, BSN, CCM [place]  [facility], Population Health Case Manager Phone: [phone]

## 2024-07-07 ENCOUNTER — Telehealth: Payer: Self-pay | Admitting: *Deleted

## 2024-07-07 ENCOUNTER — Other Ambulatory Visit: Payer: MEDICAID

## 2024-07-07 NOTE — Patient Outreach (Signed)
 Complex Care Management   Visit Note  07/07/2024  Name:  IGNATIUS KLOOS MRN: [id number] DOB: 06-Jan-1991  Situation: RNCM called for scheduled telephone follow up. Patient reports he is at another appointment at this time and request to reschedule telephone assessment.  Follow Up Plan:   Telephone follow up appointment date/time:  07/09/24  Heddy Shutter, RN, MSN, BSN, CCM [place]  [facility], Population Health Case Manager Phone: [phone]

## 2024-07-07 NOTE — Progress Notes (Signed)
 Complex Care Management Note Care Guide Note  07/07/2024 Name: [name] MRN: [id number] DOB: 09-18-1991   Complex Care Management Outreach Attempts: An unsuccessful telephone outreach was attempted today to offer the patient information about available complex care management services.  Follow Up Plan:  Additional outreach attempts will be made to offer the patient complex care management information and services.   Encounter Outcome:  No Answer  [facility] HealthPopulation Health Care Guide  Direct [phone] Fax:[phone] Website: [email or web address]

## 2024-07-07 NOTE — Progress Notes (Signed)
 Complex Care Management Note Care Guide Note  07/07/2024 Name: [name] MRN: [id number] DOB: 03-Dec-1990   Complex Care Management Outreach Attempts: An unsuccessful telephone outreach was attempted today to offer the patient information about available complex care management services.  Follow Up Plan:  Additional outreach attempts will be made to offer the patient complex care management information and services.   Encounter Outcome:  No Answer  [facility] HealthPopulation Health Care Guide  Direct Dial:[phone] [phone] Website: [email or web address]

## 2024-07-08 ENCOUNTER — Telehealth: Payer: Self-pay | Admitting: *Deleted

## 2024-07-08 NOTE — Progress Notes (Signed)
 Complex Care Management Note Care Guide Note  07/08/2024 Name: [name] MRN: [id number] DOB: 09-24-1991   Complex Care Management Outreach Attempts: An unsuccessful telephone outreach was attempted today to offer the patient information about available complex care management services.  Follow Up Plan:  Additional outreach attempts will be made to offer the patient complex care management information and services.   Encounter Outcome:  No Answer  [facility] HealthPopulation Health Care Guide  Direct Dial:[phone] Fax:[phone] Website: [email or web address]

## 2024-07-08 NOTE — Progress Notes (Signed)
 Complex Care Management Note Care Guide Note  07/08/2024 Name: [name] MRN: [id number] DOB: 1991-08-18   Complex Care Management Outreach Attempts: A second unsuccessful outreach was attempted today to offer the patient with information about available complex care management services.  Follow Up Plan:  Additional outreach attempts will be made to offer the patient complex care management information and services.   Encounter Outcome:  No Answer  [facility] HealthPopulation Health Care Guide  Direct Dial:[phone] [phone] Website: [email or web address]

## 2024-07-09 ENCOUNTER — Telehealth: Payer: MEDICAID

## 2024-07-09 ENCOUNTER — Telehealth: Payer: Self-pay | Admitting: *Deleted

## 2024-07-09 NOTE — Progress Notes (Signed)
 Complex Care Management Note Care Guide Note  07/09/2024 Name: [name] MRN: [id number] DOB: 1991/05/26   Complex Care Management Outreach Attempts: An unsuccessful telephone outreach was attempted today to offer the patient information about available complex care management services.  Follow Up Plan:  Additional outreach attempts will be made to offer the patient complex care management information and services.   Encounter Outcome:  No Answer  [facility] HealthPopulation Health Care Guide  Direct [phone] Fax:[phone] Website: [email or web address]

## 2024-07-09 NOTE — Patient Outreach (Signed)
 Complex Care Management   Visit Note  07/09/2024  Name:  [name] MRN: [id number] DOB: 1991/08/28  Situation: RNCM attempted to contact patient for scheduled telephone visit unsuccessfully- Attempt #2. Plan was to complete conference call with [facility] extender to connect patient with [facility]'s care management team. RNCM did complete an electronic referral form to request [facility] Management services on 07/08/24.   Heddy Shutter, RN, MSN, BSN, CCM [place]  [facility], Population Health Case Manager Phone: [phone]

## 2024-07-09 NOTE — Patient Instructions (Signed)
 Harshith LITTIE Metro - I am sorry I was unable to reach you today for our scheduled appointment. I work with Alvia Corean LITTIE, FNP and am calling to support your healthcare needs. Please contact me at [phone] at your earliest convenience. I look forward to speaking with you soon.   Thank you,   Heddy Shutter, RN, MSN, BSN, CCM [place]  [facility], Population Health Case Manager Phone: [phone]

## 2024-07-11 ENCOUNTER — Telehealth: Payer: Self-pay

## 2024-07-11 ENCOUNTER — Other Ambulatory Visit (HOSPITAL_COMMUNITY): Payer: Self-pay

## 2024-07-11 NOTE — Patient Outreach (Signed)
 Complex Care Management   Visit Note  07/11/2024 Late entry for 07/10/24 at 1:14 pm  Name:  [name] MRN: [id number] DOB: 1991-02-22  Situation:  RNCM returned call to [facility] CM to assist with connecting patient with services. No answer. Voice message left.  RNCM will await return call   Heddy Shutter, RN, MSN, BSN, CCM [place]  [facility], Population Health Case Manager Phone: [phone]

## 2024-07-11 NOTE — Patient Outreach (Signed)
 Complex Care Management   Visit Note  07/11/2024  Name:  [name] MRN: [id number] DOB: [birth year]  Situation: RNCM returned call to [facility] CM [facility] who provide CM for [facility]- spoke with Odella Bolognese CM re: patient needs/ PCP requesting home health PT/OT.  Follow up: CM will be provided by [facility] with [facility].   Heddy Shutter, RN, MSN, BSN, CCM [place]  [facility], Population Health Case Manager Phone: [phone]

## 2024-07-14 ENCOUNTER — Emergency Department (HOSPITAL_COMMUNITY)
Admission: EM | Admit: 2024-07-14 | Discharge: 2024-07-14 | Discharge status: Against Medical Advice | Payer: MEDICAID | Source: Ambulatory Visit

## 2024-07-14 ENCOUNTER — Telehealth: Payer: Self-pay | Admitting: *Deleted

## 2024-07-14 ENCOUNTER — Telehealth: Payer: MEDICAID | Admitting: Physician Assistant

## 2024-07-14 DIAGNOSIS — R8290 Unspecified abnormal findings in urine: Secondary | ICD-10-CM

## 2024-07-14 NOTE — Patient Instructions (Signed)
 [name], thank you for joining Teena Shuck, PA-C for today's virtual visit.  While this provider is not your primary care provider (PCP), if your PCP is located in our provider database this encounter information will be shared with them immediately following your visit.   A [facility] MyChart account gives you access to today's visit and all your visits, tests, and labs performed at [facility]  click here if you don't have a [facility] MyChart account or go to [email or web address]  Consent: (Patient) [name] provided verbal consent for this virtual visit at the beginning of the encounter.  Current Medications:  Current Outpatient Medications:    acetaminophen  (TYLENOL ) 500 MG tablet, Take 1,000 mg by mouth every 6 (six) hours as needed for mild pain. (Patient not taking: Reported on 07/03/2024), Disp: , Rfl:    amoxicillin -clavulanate (AUGMENTIN ) 875-125 MG tablet, Take 1 tablet by mouth every 12 (twelve) hours. (Patient not taking: Reported on 07/03/2024), Disp: 14 tablet, Rfl: 0   baclofen  (LIORESAL ) 20 MG tablet, Take 2 tablets (40 mg total) by mouth 4 (four) times daily. For spasticity- is MAX dose of Baclofen  for SCI, Disp: 720 tablet, Rfl: 1   bisacodyl  (DULCOLAX) 10 MG suppository, Place 10 mg rectally See admin instructions. Insert 1 suppository (10 mg total) into the rectum daily Indications: Neurogenic bowel. Recommend after dinner nightly dig stim, then suppository, then repeat dig stim after 15 min. (Patient not taking: Reported on 07/03/2024), Disp: , Rfl:    doxycycline  (VIBRAMYCIN ) 100 MG capsule, Take 1 capsule (100 mg total) by mouth 2 (two) times daily. (Patient not taking: Reported on 07/03/2024), Disp: 14 capsule, Rfl: 0   Ferrous Sulfate  (IRON PO), Take 1 tablet by mouth daily. (Patient not taking: Reported on 07/03/2024), Disp: , Rfl:    gabapentin  (NEURONTIN ) 300 MG capsule, Take 1 capsule (300 mg total) by mouth 3 (three) times daily. X 1  week, then increase to 600 mg 3x/day- for nerve pain, Disp: 180 capsule, Rfl: 5   hydrOXYzine  (ATARAX ) 25 MG tablet, Take 25 mg by mouth every 8 (eight) hours as needed for anxiety. (Patient not taking: Reported on 07/03/2024), Disp: , Rfl:    lidocaine  (XYLOCAINE ) 2 % solution, Use as directed 5 mLs in the mouth or throat every 3 (three) hours as needed for mouth pain. (Patient not taking: Reported on 07/03/2024), Disp: , Rfl:    Multiple Vitamin (MULTI-VITAMIN) tablet, Take 1 tablet by mouth daily. (Patient not taking: Reported on 07/03/2024), Disp: , Rfl:    Naproxen  Sodium (ALEVE  PO), Take by mouth as needed., Disp: , Rfl:    oxycodone  (OXY-IR) 5 MG capsule, Take 1 capsule (5 mg total) by mouth every 4 (four) hours as needed for up to 7 days. (Patient not taking: Reported on 07/03/2024), Disp: 30 capsule, Rfl: 0   pantoprazole  (PROTONIX ) 40 MG tablet, Take 1 tablet (40 mg total) by mouth 2 (two) times daily. (Patient not taking: Reported on 07/03/2024), Disp: 60 tablet, Rfl: 5   polyethylene glycol (MIRALAX  / GLYCOLAX ) 17 g packet, Take 17 g by mouth daily., Disp: , Rfl:    senna-docusate (SENOKOT-S) 8.6-50 MG tablet, Take 2 tablets by mouth 2 (two) times daily. (Patient not taking: Reported on 07/03/2024), Disp: , Rfl:    traZODone  (DESYREL ) 50 MG tablet, Take 25 mg by mouth at bedtime as needed for sleep. (Patient not taking: Reported on 07/03/2024), Disp: , Rfl:    valACYclovir (VALTREX) 1000 MG tablet, Take 1,000 mg by  mouth 2 (two) times daily. (Patient not taking: Reported on 07/03/2024), Disp: , Rfl:    Medications ordered in this encounter:  No orders of the defined types were placed in this encounter.    *If you need refills on other medications prior to your next appointment, please contact your pharmacy*  Follow-Up: Call back or seek an in-person evaluation if the symptoms worsen or if the condition fails to improve as anticipated.  [facility] [phone]  Other  Instructions Report to nearest ER right now.   If you have been instructed to have an in-person evaluation today at a local Urgent Care facility, please use the link below. It will take you to a list of all of our available [facility], including address, phone number and hours of operation. Please do not delay care.  [facility]  If you or a family member do not have a primary care provider, use the link below to schedule a visit and establish care. When you choose a [facility] primary care physician or advanced practice provider, you gain a long-term partner in health. Find a Primary Care Provider  Learn more about [facility]'s in-office and virtual care options: [place] - Get Care Now
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
## 2024-07-14 NOTE — Progress Notes (Signed)
 Complex Care Management Note Care Guide Note  07/14/2024 Name: [name] MRN: [id number] DOB: [birth year]   Complex Care Management Outreach Attempts: An unsuccessful telephone outreach was attempted today to offer the patient information about available complex care management services.  Follow Up Plan:  No further outreach attempts will be made at this time. We have been unable to contact the patient to offer or enroll patient in complex care management services.  Encounter Outcome:  No Answer [facility] HealthPopulation Health Care Guide  Direct Dial:[phone] Fax:[phone] Website: [email or web address]

## 2024-07-15 ENCOUNTER — Emergency Department (HOSPITAL_BASED_OUTPATIENT_CLINIC_OR_DEPARTMENT_OTHER)
Admission: EM | Admit: 2024-07-15 | Discharge: 2024-07-15 | Disposition: A | Discharge status: Home / Self Care | Payer: MEDICAID | Source: Ambulatory Visit | Attending: Emergency Medicine | Admitting: Emergency Medicine

## 2024-07-15 ENCOUNTER — Other Ambulatory Visit: Payer: Self-pay

## 2024-07-15 DIAGNOSIS — Z87891 Personal history of nicotine dependence: Secondary | ICD-10-CM | POA: Diagnosis not present

## 2024-07-15 DIAGNOSIS — R300 Dysuria: Secondary | ICD-10-CM | POA: Insufficient documentation

## 2024-07-15 LAB — URINALYSIS, ROUTINE W REFLEX MICROSCOPIC
Bacteria, UA: NONE SEEN
Bilirubin Urine: NEGATIVE
Glucose, UA: NEGATIVE mg/dL
Hgb urine dipstick: NEGATIVE
Ketones, ur: NEGATIVE mg/dL
Leukocytes,Ua: NEGATIVE
Nitrite: NEGATIVE
Protein, ur: 30 mg/dL — AB
Specific Gravity, Urine: 1.038 — ABNORMAL HIGH (ref 1.005–1.030)
pH: 6.5 (ref 5.0–8.0)

## 2024-07-15 NOTE — ED Triage Notes (Signed)
 Patient states urine smells and looks like feces. Worsening for 3 days. Patient is paraplegic who had a recent surgery. Had video visit with doctor who instructed him to come here.

## 2024-07-15 NOTE — ED Notes (Signed)
 This RN called lab to follow up on why the urine sample hasn't resulted. The lab said that they never received this pts urine sample. This RN personally dropped the sample off properly labeled, will follow up with charge nurse.

## 2024-07-15 NOTE — ED Notes (Signed)
 When this RN notified the pt that we would need another urine sample he became upset and wanted to leave. He did not want to talk to the provider again and proceeded to leave.

## 2024-07-15 NOTE — ED Notes (Addendum)
 The lab tech came over and said they found the pts urine, will follow up with pt.

## 2024-07-15 NOTE — ED Notes (Signed)
 Pt provided with new condom cath and leg bag since he wears that all the time. Pt provided with wipes to clean beforehand.

## 2024-07-15 NOTE — ED Notes (Signed)
Attempted to call pt x1
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
## 2024-07-15 NOTE — ED Notes (Signed)
 Patient returned call. Alan, RN updated patient with urine results that are negative. Interpreted by Dr. Zackowski prior to the phone call. No medications were warranted.

## 2024-07-22 ENCOUNTER — Encounter: Payer: Self-pay | Admitting: Family Medicine

## 2024-07-23 ENCOUNTER — Other Ambulatory Visit: Payer: Self-pay | Admitting: Family Medicine

## 2024-07-23 DIAGNOSIS — G8929 Other chronic pain: Secondary | ICD-10-CM

## 2024-07-23 DIAGNOSIS — M8660 Other chronic osteomyelitis, unspecified site: Secondary | ICD-10-CM

## 2024-07-23 NOTE — Telephone Encounter (Signed)
 Copied from CRM [id number]. Topic: Clinical - Medication Refill >> Jul 23, 2024  9:19 AM Aleatha C wrote: Medication: oxycodone  (OXY-IR) 5 MG capsule (Expired)  Has the patient contacted their pharmacy? Yes (Agent: If no, request that the patient contact the pharmacy for the refill. If patient does not wish to contact the pharmacy document the reason why and proceed with request.) (Agent: If yes, when and what did the pharmacy advise?)  This is the patient's preferred pharmacy   [facility] [address] [place] - [address] [place] [address] Phone: [phone] Fax: [phone]  Is this the correct pharmacy for this prescription? Yes If no, delete pharmacy and type the correct one.   Has the prescription been filled recently? No  Is the patient out of the medication? Yes  Has the patient been seen for an appointment in the last year OR does the patient have an upcoming appointment? Yes  Can we respond through MyChart? No  Agent: Please be advised that Rx refills may take up to 3 business days. We ask that you follow-up with your pharmacy.

## 2024-07-24 ENCOUNTER — Other Ambulatory Visit: Payer: Self-pay

## 2024-07-24 MED ORDER — OXYCODONE HCL 5 MG PO CAPS: 5.0000 mg | ORAL_CAPSULE | ORAL | 0 refills | Status: AC | PRN
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
## 2024-07-30 NOTE — Patient Outreach (Signed)
 Case Closure Visit Note  07/30/2024  Name:  [name] MRN: [id number] DOB: [birth year]  Situation: [facility] Tailored Plan-Patient not eligible for services. Patient connected with [facility] Case Management. Case closed.   Follow Up Plan:   No follow up required. Case Closed. Patient will be followed by [facility] Management  Heddy Shutter, RN, MSN, BSN, CCM   [facility], Population Health Case Manager Phone: [phone]

## 2024-07-31 ENCOUNTER — Other Ambulatory Visit: Payer: Self-pay

## 2024-07-31 ENCOUNTER — Emergency Department (HOSPITAL_COMMUNITY)
Admission: EM | Admit: 2024-07-31 | Discharge: 2024-07-31 | Discharge status: Against Medical Advice | Payer: MEDICAID | Attending: Emergency Medicine | Admitting: Emergency Medicine

## 2024-07-31 ENCOUNTER — Encounter (HOSPITAL_COMMUNITY): Payer: Self-pay

## 2024-07-31 DIAGNOSIS — K5900 Constipation, unspecified: Secondary | ICD-10-CM | POA: Diagnosis present

## 2024-07-31 DIAGNOSIS — Z5321 Procedure and treatment not carried out due to patient leaving prior to being seen by health care provider: Secondary | ICD-10-CM | POA: Diagnosis not present

## 2024-07-31 NOTE — ED Triage Notes (Signed)
 Patient presents with constipation for 1.5 weeks. He states that laxatives haven't helped him to have a BM. He reports having mew issues with leg and muscle movement which he believes is contributing. ON digital self-disimpaction patient states only blood comes out.

## 2024-07-31 NOTE — ED Notes (Addendum)
 One attempt made to place an IV and get blood. Patient now request IV Team place his IV.

## 2024-07-31 NOTE — ED Notes (Signed)
 Previous dressing saturated. Dressing replaced per patient instructions.

## 2024-07-31 NOTE — ED Provider Notes (Signed)
 [place] EMERGENCY DEPARTMENT AT [facility] Provider Note   CSN: [id number] Arrival date & time: 07/31/24  1517     Patient presents with: Constipation   [name] is a 33 y.o. male.   33 year old male with history of paraplegia presents due to constipation x 1 week.  Denies any emesis.  States that he has attempted to manually disimpact himself without success.  No fever or chills.  Does have sacral decubitus ulcers which she states have not been draining.  Has not tried enema yet       Prior to Admission medications   Medication Sig Start Date End Date Taking? Authorizing Provider  amoxicillin -clavulanate (AUGMENTIN ) 875-125 MG tablet Take 1 tablet by mouth every 12 (twelve) hours. Patient not taking: Reported on 07/03/2024 11/18/23   [name] Chew, PA-C  baclofen  (LIORESAL ) 20 MG tablet Take 2 tablets (40 mg total) by mouth 4 (four) times daily. For spasticity- is MAX dose of Baclofen  for SCI 02/29/24   Lovorn, Megan, MD  doxycycline  (VIBRAMYCIN ) 100 MG capsule Take 1 capsule (100 mg total) by mouth 2 (two) times daily. Patient not taking: Reported on 07/03/2024 11/18/23   [name] Chew, PA-C  gabapentin  (NEURONTIN ) 300 MG capsule Take 1 capsule (300 mg total) by mouth 3 (three) times daily. X 1 week, then increase to 600 mg 3x/day- for nerve pain 02/29/24   Lovorn, Megan, MD  Naproxen  Sodium (ALEVE  PO) Take by mouth as needed.    [provider]  oxycodone  (OXY-IR) 5 MG capsule Take 1 capsule (5 mg total) by mouth every 4 (four) hours as needed for up to 7 days. 07/24/24 07/31/24  Alvia Corean LITTIE, FNP  pantoprazole  (PROTONIX ) 40 MG tablet Take 1 tablet (40 mg total) by mouth 2 (two) times daily. Patient not taking: Reported on 07/03/2024 06/11/23   Beather Delon Gibson, PA  polyethylene glycol (MIRALAX  / GLYCOLAX ) 17 g packet Take 17 g by mouth daily. 08/01/23   [provider]    Allergies: Ciprofloxacin     Review of Systems  All other  systems reviewed and are negative.   Updated Vital Signs BP 136/76 (BP Location: Left Arm)   Pulse 71   Temp 98.4 F (36.9 C) (Oral)   Resp 16   Ht 1.803 m (5' 11)   Wt 63.5 kg   SpO2 100%   BMI 19.53 kg/m   Physical Exam Vitals and nursing note reviewed. Exam conducted with a chaperone present.  Constitutional:      General: He is not in acute distress.    Appearance: Normal appearance. He is well-developed. He is not toxic-appearing.  HENT:     Head: Normocephalic and atraumatic.  Eyes:     General: Lids are normal.     Conjunctiva/sclera: Conjunctivae normal.     Pupils: Pupils are equal, round, and reactive to light.  Neck:     Thyroid: No thyroid mass.     Trachea: No tracheal deviation.  Cardiovascular:     Rate and Rhythm: Normal rate and regular rhythm.     Heart sounds: Normal heart sounds. No murmur heard.    No gallop.  Pulmonary:     Effort: Pulmonary effort is normal. No respiratory distress.     Breath sounds: Normal breath sounds. No stridor. No decreased breath sounds, wheezing, rhonchi or rales.  Abdominal:     General: There is no distension.     Palpations: Abdomen is soft.     Tenderness: There is no  abdominal tenderness. There is no rebound.  Genitourinary:     Comments: No stool noted on DRE Musculoskeletal:        General: No tenderness. Normal range of motion.     Cervical back: Normal range of motion and neck supple.  Skin:    General: Skin is warm and dry.     Findings: No abrasion or rash.  Neurological:     Mental Status: He is alert and oriented to person, place, and time. Mental status is at baseline.     GCS: GCS eye subscore is 4. GCS verbal subscore is 5. GCS motor subscore is 6.     Cranial Nerves: No cranial nerve deficit.     Sensory: No sensory deficit.     Motor: Motor function is intact.  Psychiatric:        Attention and Perception: Attention normal.        Speech: Speech normal.        Behavior: Behavior normal.      (all labs ordered are listed, but only abnormal results are displayed) Labs Reviewed  CBC WITH DIFFERENTIAL/PLATELET  I-STAT CHEM 8, ED    EKG: None  Radiology: No results found.   Procedures   Medications Ordered in the ED - No data to display                                  Medical Decision Making Amount and/or Complexity of Data Reviewed Labs: ordered. Radiology: ordered.   Abdominal CT ordered.  Concern for possible bowel obstruction.  Patient with left prior to results of workup     Final diagnoses:  None    ED Discharge Orders     None          Dasie Faden, MD 08/06/24 1135

## 2024-07-31 NOTE — ED Notes (Signed)
 Patient leaving prior to discharge. MD made aware. Patient refused to sign AMA form.

## 2024-08-04 ENCOUNTER — Emergency Department (HOSPITAL_BASED_OUTPATIENT_CLINIC_OR_DEPARTMENT_OTHER): Payer: MEDICAID

## 2024-08-04 ENCOUNTER — Encounter (HOSPITAL_BASED_OUTPATIENT_CLINIC_OR_DEPARTMENT_OTHER): Payer: Self-pay | Admitting: Emergency Medicine

## 2024-08-04 ENCOUNTER — Emergency Department (HOSPITAL_BASED_OUTPATIENT_CLINIC_OR_DEPARTMENT_OTHER)
Admission: EM | Admit: 2024-08-04 | Discharge: 2024-08-04 | Disposition: A | Discharge status: Home / Self Care | Payer: MEDICAID | Attending: Emergency Medicine | Admitting: Emergency Medicine

## 2024-08-04 DIAGNOSIS — A419 Sepsis, unspecified organism: Secondary | ICD-10-CM | POA: Diagnosis not present

## 2024-08-04 DIAGNOSIS — L02416 Cutaneous abscess of left lower limb: Secondary | ICD-10-CM | POA: Insufficient documentation

## 2024-08-04 DIAGNOSIS — R509 Fever, unspecified: Secondary | ICD-10-CM | POA: Diagnosis present

## 2024-08-04 LAB — COMPREHENSIVE METABOLIC PANEL WITH GFR
ALT: 8 U/L (ref 0–44)
AST: 12 U/L — ABNORMAL LOW (ref 15–41)
Albumin: 3.5 g/dL (ref 3.5–5.0)
Alkaline Phosphatase: 102 U/L (ref 38–126)
Anion gap: 13 (ref 5–15)
BUN: 12 mg/dL (ref 6–20)
CO2: 26 mmol/L (ref 22–32)
Calcium: 9.3 mg/dL (ref 8.9–10.3)
Chloride: 97 mmol/L — ABNORMAL LOW (ref 98–111)
Creatinine, Ser: 0.85 mg/dL (ref 0.61–1.24)
GFR, Estimated: >60 mL/min (ref >60)
Glucose, Bld: 97 mg/dL (ref 70–99)
Potassium: 4.1 mmol/L (ref 3.5–5.1)
Sodium: 136 mmol/L (ref 135–145)
Total Bilirubin: 0.3 mg/dL (ref 0.0–1.2)
Total Protein: 8.7 g/dL — ABNORMAL HIGH (ref 6.5–8.1)

## 2024-08-04 LAB — CBC WITH DIFFERENTIAL/PLATELET
Abs Immature Granulocytes: 0.1 K/uL — ABNORMAL HIGH (ref 0.00–0.07)
Basophils Absolute: 0.1 K/uL (ref 0.0–0.1)
Basophils Relative: 0 %
Eosinophils Absolute: 0.3 K/uL (ref 0.0–0.5)
Eosinophils Relative: 2 %
HCT: 30.3 % — ABNORMAL LOW (ref 39.0–52.0)
Hemoglobin: 9.2 g/dL — ABNORMAL LOW (ref 13.0–17.0)
Immature Granulocytes: 1 %
Lymphocytes Relative: 8 %
Lymphs Abs: 1.5 K/uL (ref 0.7–4.0)
MCH: 20.3 pg — ABNORMAL LOW (ref 26.0–34.0)
MCHC: 30.4 g/dL (ref 30.0–36.0)
MCV: 66.7 fL — ABNORMAL LOW (ref 80.0–100.0)
Monocytes Absolute: 1.1 K/uL — ABNORMAL HIGH (ref 0.1–1.0)
Monocytes Relative: 5 %
Neutro Abs: 17 K/uL — ABNORMAL HIGH (ref 1.7–7.7)
Neutrophils Relative %: 84 %
Platelets: 691 K/uL — ABNORMAL HIGH (ref 150–400)
RBC: 4.54 MIL/uL (ref 4.22–5.81)
RDW: 16.7 % — ABNORMAL HIGH (ref 11.5–15.5)
Smear Review: NORMAL
WBC: 20.1 K/uL — ABNORMAL HIGH (ref 4.0–10.5)
nRBC: 0 % (ref 0.0–0.2)

## 2024-08-04 LAB — URINALYSIS, W/ REFLEX TO CULTURE (INFECTION SUSPECTED)
Bacteria, UA: NONE SEEN
Bilirubin Urine: NEGATIVE
Glucose, UA: NEGATIVE mg/dL
Hgb urine dipstick: NEGATIVE
Ketones, ur: NEGATIVE mg/dL
Leukocytes,Ua: NEGATIVE
Nitrite: NEGATIVE
Protein, ur: 30 mg/dL — AB
Specific Gravity, Urine: 1.018 (ref 1.005–1.030)
pH: 6 (ref 5.0–8.0)

## 2024-08-04 LAB — LACTIC ACID, PLASMA: Lactic Acid, Venous: 1 mmol/L (ref 0.5–1.9)

## 2024-08-04 LAB — PROTIME-INR
INR: 1.1 (ref 0.8–1.2)
Prothrombin Time: 15 s (ref 11.4–15.2)

## 2024-08-04 MED ORDER — SODIUM CHLORIDE 0.9 % IV BOLUS
1000.0000 mL | Freq: Once | INTRAVENOUS | Status: AC
  Administered 2024-08-04: 1000 mL via INTRAVENOUS

## 2024-08-04 MED ORDER — VANCOMYCIN HCL 10 G IV SOLR: 20.0000 mg/kg | Freq: Once | INTRAVENOUS | Status: DC

## 2024-08-04 MED ORDER — IOHEXOL 300 MG/ML  SOLN
75.0000 mL | Freq: Once | INTRAMUSCULAR | Status: AC | PRN
  Administered 2024-08-04: 75 mL via INTRAVENOUS

## 2024-08-04 MED ORDER — VANCOMYCIN HCL IN DEXTROSE 1-5 GM/200ML-% IV SOLN
1000.0000 mg | Freq: Once | INTRAVENOUS | Status: AC
  Administered 2024-08-04: 1000 mg via INTRAVENOUS
  Filled 2024-08-04: qty 200

## 2024-08-04 MED ORDER — ACETAMINOPHEN 500 MG PO TABS
1000.0000 mg | ORAL_TABLET | Freq: Once | ORAL | Status: AC
  Administered 2024-08-04: 1000 mg via ORAL
  Filled 2024-08-04: qty 2

## 2024-08-04 MED ORDER — PIPERACILLIN-TAZOBACTAM 3.375 G IVPB 30 MIN
3.3750 g | Freq: Once | INTRAVENOUS | Status: AC
  Administered 2024-08-04: 3.375 g via INTRAVENOUS
  Filled 2024-08-04: qty 50

## 2024-08-04 NOTE — ED Notes (Signed)
 Patient has been accepted by [facility]--Dr. Harlene Lites is accepting.  I have called [facility] and [facility] to transport.  Which ever gets her 1st

## 2024-08-04 NOTE — ED Notes (Signed)
 PT

## 2024-08-04 NOTE — ED Notes (Signed)
 Patient has been accepted by [facility] emergency.  Dr. Harlene Schultze is accepting

## 2024-08-04 NOTE — ED Triage Notes (Signed)
 Pt c/o fever x 3 days, constipation x 2 weeks. Pt endorses concern for sepsis. Reports 2 x sacral pressure ulcers
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
## 2024-08-04 NOTE — ED Provider Notes (Signed)
 [place] EMERGENCY DEPARTMENT AT [facility] Provider Note   CSN: [id number] Arrival date & time: 08/04/24  9041     Patient presents with: Fever   [name] is a 33 y.o. male.  {Add pertinent medical, surgical, social history, OB history to HPI:32947} 33 yo M with a chief complaints of fevers chills constipation.  Going on for about a week or so.  He was actually seen at [facility] but left prior to having his full workup.  Having abdominal pain.  Some nausea.  Denies urinary symptoms.   Fever      Prior to Admission medications   Medication Sig Start Date End Date Taking? Authorizing Provider  amoxicillin -clavulanate (AUGMENTIN ) 875-125 MG tablet Take 1 tablet by mouth every 12 (twelve) hours. Patient not taking: Reported on 07/03/2024 11/18/23   [name] Chew, PA-C  baclofen  (LIORESAL ) 20 MG tablet Take 2 tablets (40 mg total) by mouth 4 (four) times daily. For spasticity- is MAX dose of Baclofen  for SCI 02/29/24   Lovorn, Megan, MD  doxycycline  (VIBRAMYCIN ) 100 MG capsule Take 1 capsule (100 mg total) by mouth 2 (two) times daily. Patient not taking: Reported on 07/03/2024 11/18/23   [name] Chew, PA-C  gabapentin  (NEURONTIN ) 300 MG capsule Take 1 capsule (300 mg total) by mouth 3 (three) times daily. X 1 week, then increase to 600 mg 3x/day- for nerve pain 02/29/24   Lovorn, Megan, MD  Naproxen  Sodium (ALEVE  PO) Take by mouth as needed.    [provider]  oxycodone  (OXY-IR) 5 MG capsule Take 1 capsule (5 mg total) by mouth every 4 (four) hours as needed for up to 7 days. 07/24/24 07/31/24  Alvia Corean LITTIE, FNP  pantoprazole  (PROTONIX ) 40 MG tablet Take 1 tablet (40 mg total) by mouth 2 (two) times daily. Patient not taking: Reported on 07/03/2024 06/11/23   Beather Delon Gibson, PA  polyethylene glycol (MIRALAX  / GLYCOLAX ) 17 g packet Take 17 g by mouth daily. 08/01/23   [provider]    Allergies: Ciprofloxacin     Review of Systems   Constitutional:  Positive for fever.    Updated Vital Signs BP 129/81 (BP Location: Right Arm)   Pulse (!) 132   Temp 100.1 F (37.8 C) (Oral)   Resp 16   Wt 54.4 kg   SpO2 99%   BMI 16.74 kg/m   Physical Exam Vitals and nursing note reviewed.  Constitutional:      Appearance: He is well-developed.  HENT:     Head: Normocephalic and atraumatic.  Eyes:     Pupils: Pupils are equal, round, and reactive to light.  Neck:     Vascular: No JVD.  Cardiovascular:     Rate and Rhythm: Normal rate and regular rhythm.     Heart sounds: No murmur heard.    No friction rub. No gallop.  Pulmonary:     Effort: No respiratory distress.     Breath sounds: No wheezing.  Abdominal:     General: There is no distension.     Tenderness: There is no abdominal tenderness. There is no guarding or rebound.  Genitourinary:    Comments: No stool in the vault. Musculoskeletal:        General: Normal range of motion.     Cervical back: Normal range of motion and neck supple.     Comments: Ulcer to each buttock with significant depth but no obvious surrounding erythema drainage.    Skin:    Coloration: Skin  is not pale.     Findings: No rash.  Neurological:     Mental Status: He is alert and oriented to person, place, and time.  Psychiatric:        Behavior: Behavior normal.     (all labs ordered are listed, but only abnormal results are displayed) Labs Reviewed  COMPREHENSIVE METABOLIC PANEL WITH GFR - Abnormal; Notable for the following components:      Result Value   Chloride 97 (*)    Total Protein 8.7 (*)    AST 12 (*)    All other components within normal limits  CBC WITH DIFFERENTIAL/PLATELET - Abnormal; Notable for the following components:   WBC 20.1 (*)    Hemoglobin 9.2 (*)    HCT 30.3 (*)    MCV 66.7 (*)    MCH 20.3 (*)    RDW 16.7 (*)    Platelets 691 (*)    Neutro Abs 17.0 (*)    Monocytes Absolute 1.1 (*)    Abs Immature Granulocytes 0.10 (*)    All other  components within normal limits  URINALYSIS, W/ REFLEX TO CULTURE (INFECTION SUSPECTED) - Abnormal; Notable for the following components:   Protein, ur 30 (*)    All other components within normal limits  CULTURE, BLOOD (ROUTINE X 2)  CULTURE, BLOOD (ROUTINE X 2)  LACTIC ACID, PLASMA  PROTIME-INR  LACTIC ACID, PLASMA    EKG: None  Radiology: [facility] Chest Port 1 View Result Date: 08/04/2024 CLINICAL DATA:  Questionable sepsis. EXAM: PORTABLE CHEST 1 VIEW COMPARISON:  11/18/2023 and CT chest 03/30/2020. FINDINGS: Insert normal chest. IMPRESSION: No active disease. Electronically Signed   By: Newell Eke M.D.   On: 08/04/2024 11:16    {Document cardiac monitor, telemetry assessment procedure when appropriate:32947} Procedures   Medications Ordered in the ED  acetaminophen  (TYLENOL ) tablet 1,000 mg (1,000 mg Oral Given 08/04/24 1144)  sodium chloride  0.9 % bolus 1,000 mL (1,000 mLs Intravenous New Bag/Given 08/04/24 1129)      {Click here for ABCD2, HEART and other calculators REFRESH Note before signing:1}                              Medical Decision Making Amount and/or Complexity of Data Reviewed Labs: ordered. Radiology: ordered.  Risk OTC drugs. Prescription drug management. Decision regarding hospitalization.   33 yo M with a chief complaints of concern for deep space infection.  Patient has had osteomyelitis of the sacrum previously and thinks it feels the same.  He otherwise denies urinary symptoms denies cough.  Has been having some abdominal pain and constipation for about 2 weeks now.  Will obtain laboratory evaluation and CT scan chest x-ray UA.  UA negative for infection on my independent interpretation  Chest x-ray independently interpreted by me without focal infiltrate or pneumothorax.  CT scan of the abdomen pelvis with abscess to the left anterior hip.  I discussed this with Ozell Purchase, on-call for Ortho recommends medical admission and consultation to  gen ortho on call upon arrival.   Will start on broad-spectrum antibiotics.  I discussed case with our hospitalist, they had take care of the patient in December and that had some concerns if the patient needed surgical intervention that maybe he would be best served in the [facility].  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  He tells me that he went well and he had his drain removed recently.  The patient is willing to go back to [facility].  Will attempt to contact them through the transfer line.  {Document critical care time when appropriate  Document review of labs and clinical decision tools ie CHADS2VASC2, etc  Document your independent review of radiology images and any outside records  Document your discussion with family members, caretakers and with consultants  Document social determinants of health affecting pt's care  Document your decision making why or why not admission, treatments were needed:32947:::1}   Final diagnoses:  None    ED Discharge Orders     None

## 2024-08-04 NOTE — ED Notes (Signed)
 PT refused COVID swab stating I don't want that, I know I Don't Have COVID, RN & Attending Physician informed.

## 2024-08-04 NOTE — Plan of Care (Addendum)
 The patient was requested for [facility] from [facility] for the evaluation of sacral pressure ulcers.  He had fever for the last 3 days.  Concern for sepsis.  CT abdomen/pelvis shows bilateral decubitus ulcers over the ischial tuberosities, progressive on the right,persistent complex fluid collection anterior to the left hip with small internal air bubbles, suspicious for a chronic/recurrent abscess.  Patient gets most of his care at [facility].  He was recently admitted in June under orthopedic service and was discharged by orthopedics / plastic surgery team on 05/21/2024. I have requested ED provider Dr. Emil, to transfer him to [facility] because most of his providers are at [facility].  If this patient gets admitted here, we may need to transfer this patient eventually.Admission declined.

## 2024-08-04 NOTE — ED Notes (Signed)
 Report given to the [facility] ED Charge RN.SABRASABRA

## 2024-08-04 NOTE — ED Notes (Signed)
 Report given to [facility].

## 2024-08-09 LAB — CULTURE, BLOOD (ROUTINE X 2)
Culture: NO GROWTH
Culture: NO GROWTH
Special Requests: ADEQUATE
Special Requests: ADEQUATE

## 2024-08-14 ENCOUNTER — Telehealth: Payer: Self-pay | Admitting: *Deleted

## 2024-08-14 MED FILL — Morphine Sulfate Inj 4 MG/ML: INTRAMUSCULAR | Qty: 1 | Status: AC

## 2024-08-14 NOTE — Transitions of Care (Post Inpatient/ED Visit) (Signed)
 08/14/2024  Name: [name] MRN: [id number] DOB: June 04, 1991  Today's TOC FU Call Status: Today's TOC FU Call Status:: Successful TOC FU Call Completed TOC FU Call Complete Date: 08/14/24 Patient's Name and Date of Birth confirmed.  Transition Care Management Follow-up Telephone Call Date of Discharge: 08/13/24 Discharge Facility: Other [facility]) Name of Other ([facility]) Discharge Facility: [facility] Type of Discharge: Inpatient Admission Primary Inpatient Discharge Diagnosis:: (L) hip abscess due to stage 4 pressure ulcer/ sepsis How have you been since you were released from the hospital?: Same (I don't feel like even talking to anyone in the healthcare system, now or ever.  The health care system has failed me, it's between me and God now, I will tell [name] all of this when I go see her.  Thanks for getting the appointment set up) Any questions or concerns?: Yes Patient Questions/Concerns:: Patient verbalizes multiple complaints re: recent hospitalization and health care systen in general going back to 4 years ago when this all started He declined full TOC assessment Patient Questions/Concerns Addressed: Other: (Facilitated scheduling hospital follow up with PCP; care coordination outreach to PCP as FYI; extensive emotional support and information provided to patient, who declines further outreach after TOC call today)  Items Reviewed: Did you receive and understand the discharge instructions provided?:  (unable to assess: patient declines full TOC call) Medications obtained,verified, and reconciled?: No Medications Not Reviewed Reasons:: Other: (unable to assess: patient declines full TOC call) Any new allergies since your discharge?:  (unable to assess: patient declines full TOC call) Dietary orders reviewed?: No (unable to assess: patient declines full TOC call) Do you have support at home?: Yes People in Home [RPT]: friend(s) Name of  Support/Comfort Primary Source: unable to fully assess: patient declines full TOC call: states he has a good friend who helps me with anything I need and takes me to my appointments  Medications Reviewed Today: Medications Reviewed Today     Reviewed by Vanshika Jastrzebski M, RN (Registered Nurse) on 08/14/24 at 1114  Med List Status: <None>   Medication Order Taking? Sig Documenting Provider Last Dose Status Informant  amoxicillin -clavulanate (AUGMENTIN ) 875-125 MG tablet [id number]  Take 1 tablet by mouth every 12 (twelve) hours.  Patient not taking: Reported on 07/03/2024   Geiple, Joshua, PA-C  Active   baclofen  (LIORESAL ) 20 MG tablet [id number]  Take 2 tablets (40 mg total) by mouth 4 (four) times daily. For spasticity- is MAX dose of Baclofen  for SCI Lovorn, Megan, MD  Active   doxycycline  (VIBRAMYCIN ) 100 MG capsule [id number]  Take 1 capsule (100 mg total) by mouth 2 (two) times daily.  Patient not taking: Reported on 07/03/2024   [name] Chew, PA-C  Active   gabapentin  (NEURONTIN ) 300 MG capsule [id number]  Take 1 capsule (300 mg total) by mouth 3 (three) times daily. X 1 week, then increase to 600 mg 3x/day- for nerve pain Lovorn, Megan, MD  Active   Naproxen  Sodium (ALEVE  PO) [id number]  Take by mouth as needed. [provider]  Active   oxycodone  (OXY-IR) 5 MG capsule [id number]  Take 1 capsule (5 mg total) by mouth every 4 (four) hours as needed for up to 7 days. Alvia [name] LITTIE, FNP  Expired 07/31/24 2359   pantoprazole  (PROTONIX ) 40 MG tablet [id number]  Take 1 tablet (40 mg total) by mouth 2 (two) times daily.  Patient not taking: Reported on 07/03/2024   Beather Delon Gibson, [place]  Active   polyethylene glycol (MIRALAX  /  GLYCOLAX ) 17 g packet [id number]  Take 17 g by mouth daily. [provider]  Active            Home Care and Equipment/Supplies: Were Home Health Services Ordered?: Yes Name of Home Health Agency:: [facility] Has Agency set up a time  to come to your home?: Yes First Home Health Visit Date: 08/14/24 Any new equipment or medical supplies ordered?:  (unable to assess: patient declines full TOC call)  Functional Questionnaire: Do you need assistance with bathing/showering or dressing?:  (unable to assess: patient declines full TOC call) Do you need assistance with meal preparation?:  (unable to assess: patient declines full TOC call) Do you need assistance with eating?:  (unable to assess: patient declines full TOC call) Do you have difficulty maintaining continence:  (unable to assess: patient declines full TOC call) Do you need assistance with getting out of bed/getting out of a chair/moving?:  (unable to assess: patient declines full TOC call) Do you have difficulty managing or taking your medications?:  (unable to assess: patient declines full TOC call)  Follow up appointments reviewed: PCP Follow-up appointment confirmed?: Yes (care coordination outreach in real-time with scheduling care guide to successfully schedule hospital follow up PCP appointment 08/19/24) Date of PCP follow-up appointment?: 08/19/24 Follow-up Provider: PCP- [name] Follow-up appointment confirmed?:  (unable to assess: patient declines full TOC call) Do you need transportation to your follow-up appointment?: No Do you understand care options if your condition(s) worsen?:  (unable to assess: patient declines full TOC call)  SDOH Interventions Today    Flowsheet Row Most Recent Value  SDOH Interventions   Food Insecurity Interventions Patient Declined  [unable to assess: patient declines full TOC call]  Housing Interventions Patient Declined  [unable to assess: patient declines full TOC call]  Transportation Interventions Intervention Not Indicated  [Reports has good friend who provides all transportation]  Utilities Interventions Patient Declined  [unable to assess: patient declines full TOC call]   See TOC  assessment tabs for additional assessment/ TOC intervention information  Patient declines need for ongoing/ further care management/ coordination outreach; declines enrollment in 30-day TOC program- declines taking my direct phone number should needs/ concerns arise post-TOC call   Pls call/ message for questions,  Maricela Kawahara Mckinney Bellah Alia, RN, BSN, Media planner  Transitions of Care  [facility]  [place] [phone]: direct office

## 2024-08-17 DIAGNOSIS — L02415 Cutaneous abscess of right lower limb: Secondary | ICD-10-CM | POA: Insufficient documentation

## 2024-08-17 NOTE — Progress Notes (Unsigned)
   Acute Office Visit  Subjective:     Patient ID: [name], male    DOB: 06/11/1991, 33 y.o.   MRN: [id number]  No chief complaint on file.   HPI  Discussed the use of AI scribe software for clinical note transcription with the patient, who gave verbal consent to proceed.  HFU from [facility] for abscess of the right hip admission 08/13/24-08/17/24 History of Present Illness      ROS Per HPI      Objective:    There were no vitals taken for this visit.   Physical Exam Vitals and nursing note reviewed.  Constitutional:      General: He is not in acute distress.    Appearance: Normal appearance.     Comments: Appears uncomfortable, shifting in wheelchair during the visit  HENT:     Head: Normocephalic and atraumatic.     Right Ear: External ear normal.     Left Ear: External ear normal.     Nose: Nose normal.     Mouth/Throat:     Mouth: Mucous membranes are moist.     Pharynx: Oropharynx is clear.  Eyes:     Extraocular Movements: Extraocular movements intact.  Cardiovascular:     Rate and Rhythm: Normal rate and regular rhythm.     Pulses: Normal pulses.     Heart sounds: Normal heart sounds.  Pulmonary:     Effort: Pulmonary effort is normal. No respiratory distress.     Breath sounds: Normal breath sounds. No wheezing, rhonchi or rales.  Musculoskeletal:        General: Normal range of motion.     Cervical back: Normal range of motion.     Right lower leg: No edema.     Left lower leg: No edema.     Comments: In wheelchair, full range of motion of bilateral upper extremities, neck and head with normal range of motion  Lymphadenopathy:     Cervical: No cervical adenopathy.  Skin:    General: Skin is warm and dry.  Neurological:     General: No focal deficit present.     Mental Status: He is alert and oriented to person, place, and time.  Psychiatric:        Mood and Affect: Mood normal.        Behavior: Behavior normal.      No results found for any visits on 08/19/24.      Assessment & Plan:   Assessment and Plan Assessment & Plan      No orders of the defined types were placed in this encounter.    No orders of the defined types were placed in this encounter.   No follow-ups on file.  Corean LITTIE Ku, FNP

## 2024-08-19 ENCOUNTER — Ambulatory Visit: Payer: MEDICAID | Admitting: Family Medicine

## 2024-08-19 ENCOUNTER — Encounter: Payer: Self-pay | Admitting: Family Medicine

## 2024-08-19 VITALS — BP 140/70 | HR 93 | Temp 97.2°F | Ht 71.0 in

## 2024-08-19 DIAGNOSIS — L02415 Cutaneous abscess of right lower limb: Secondary | ICD-10-CM

## 2024-08-19 DIAGNOSIS — M8660 Other chronic osteomyelitis, unspecified site: Secondary | ICD-10-CM

## 2024-08-19 DIAGNOSIS — Z993 Dependence on wheelchair: Secondary | ICD-10-CM

## 2024-08-19 DIAGNOSIS — R252 Cramp and spasm: Secondary | ICD-10-CM

## 2024-08-19 DIAGNOSIS — G8220 Paraplegia, unspecified: Secondary | ICD-10-CM

## 2024-08-19 DIAGNOSIS — S24109D Unspecified injury at unspecified level of thoracic spinal cord, subsequent encounter: Secondary | ICD-10-CM

## 2024-08-19 DIAGNOSIS — N319 Neuromuscular dysfunction of bladder, unspecified: Secondary | ICD-10-CM

## 2024-08-19 DIAGNOSIS — K592 Neurogenic bowel, not elsewhere classified: Secondary | ICD-10-CM

## 2024-08-19 DIAGNOSIS — G8929 Other chronic pain: Secondary | ICD-10-CM

## 2024-08-25 ENCOUNTER — Other Ambulatory Visit: Payer: Self-pay

## 2024-08-25 DIAGNOSIS — K592 Neurogenic bowel, not elsewhere classified: Secondary | ICD-10-CM

## 2024-08-25 DIAGNOSIS — R252 Cramp and spasm: Secondary | ICD-10-CM

## 2024-08-25 DIAGNOSIS — Z993 Dependence on wheelchair: Secondary | ICD-10-CM

## 2024-08-25 DIAGNOSIS — S24109D Unspecified injury at unspecified level of thoracic spinal cord, subsequent encounter: Secondary | ICD-10-CM

## 2024-08-25 DIAGNOSIS — G8220 Paraplegia, unspecified: Secondary | ICD-10-CM

## 2024-08-31 NOTE — Progress Notes (Signed)
 Pt not seen

## 2024-09-22 ENCOUNTER — Encounter: Payer: MEDICAID | Admitting: Family Medicine

## 2024-10-08 ENCOUNTER — Other Ambulatory Visit: Payer: Self-pay

## 2024-10-08 MED ORDER — BACLOFEN 20 MG PO TABS: 40.0000 mg | ORAL_TABLET | Freq: Four times a day (QID) | ORAL | 1 refills | Status: AC

## 2024-10-15 ENCOUNTER — Ambulatory Visit: Payer: Self-pay

## 2024-10-15 NOTE — Telephone Encounter (Signed)
 FYI Only or Action Required?: FYI only for provider: appointment scheduled on 10/17/2024.  Patient was last seen in primary care on 07/14/2024 by Rolan Berthold, PA-C.  Called Nurse Triage reporting Nail Problem and Wound Infection.  Symptoms began yesterday.  Interventions attempted: Nothing.  Symptoms are: gradually worsening.  Triage Disposition: See Physician Within 24 Hours  Patient/caregiver understands and will follow disposition?: Yes  Copied from CRM [id number]. Topic: Clinical - Red Word Triage >> Oct 15, 2024  3:09 PM Shereese L wrote: [facility] that prompted transfer to Nurse Triage: patient left foot is swollen, infected and discolored Reason for Disposition  [1] Wound > 48 hours old AND [2] becoming more painful or tender to touch  Answer Assessment - Initial Assessment Questions 1. LOCATION: Where is the wound located?      Left big toe 2. WOUND APPEARANCE: What does the wound look like?      Toe is red and swollen, foot is swollen  4. SPREAD: What's changed in the last day?  Do you see any red streaks coming from the wound?     denies 5. ONSET: When did it start to look infected?      One day ago, but recurrent problem 6. MECHANISM: How did the wound start, what was the cause?     Possibly ingrown toenail or fungal infection 7. PAIN: Do you have any pain?  If Yes, ask: How bad is the pain?  (e.g., Scale 1-10; mild, moderate, or severe)     Paraplegic, no pain sensed 8. FEVER: Do you have a fever? If Yes, ask: What is your temperature, how was it measured, and when did it start?     denies 9. OTHER SYMPTOMS: Do you have any other symptoms? (e.g., shaking chills, weakness, rash elsewhere on body)     denies  Protocols used: Wound Infection Suspected-A-AH

## 2024-10-16 NOTE — Telephone Encounter (Signed)
 Patient scheduled for a visit 11/21

## 2024-10-17 ENCOUNTER — Encounter: Payer: Self-pay | Admitting: Internal Medicine

## 2024-10-17 ENCOUNTER — Ambulatory Visit (INDEPENDENT_AMBULATORY_CARE_PROVIDER_SITE_OTHER): Payer: MEDICAID | Admitting: Internal Medicine

## 2024-10-17 VITALS — BP 140/100 | HR 77 | Temp 98.4°F | Ht 71.0 in

## 2024-10-17 DIAGNOSIS — R2242 Localized swelling, mass and lump, left lower limb: Secondary | ICD-10-CM | POA: Diagnosis not present

## 2024-10-17 DIAGNOSIS — S24109D Unspecified injury at unspecified level of thoracic spinal cord, subsequent encounter: Secondary | ICD-10-CM

## 2024-10-17 DIAGNOSIS — L609 Nail disorder, unspecified: Secondary | ICD-10-CM | POA: Diagnosis not present

## 2024-10-17 MED ORDER — DOXYCYCLINE HYCLATE 100 MG PO TABS: 100.0000 mg | ORAL_TABLET | Freq: Two times a day (BID) | ORAL | 0 refills | Status: AC

## 2024-10-17 MED ORDER — PREDNISONE 10 MG PO TABS: ORAL_TABLET | ORAL | 0 refills | Status: AC

## 2024-10-17 NOTE — Assessment & Plan Note (Signed)
 Pt is insensate , unable to better characterize severity of pain, but has moderate erythema and swelling - likely cellulitis but can't r/o gout (though less likely); now for doxycycline  100 bid course, and prednisone  taper,  to f/u any worsening symptoms or concerns

## 2024-10-17 NOTE — Assessment & Plan Note (Signed)
Chronic stable, continue currrent med tx

## 2024-10-17 NOTE — Assessment & Plan Note (Signed)
 Worsening, also for podiatry referral
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
## 2024-10-17 NOTE — Patient Instructions (Signed)
 You appear to have a left foot infection, or gout  Please take all new medication as prescribed  - the antibiotic, and prednisone  for a short time  Please continue all other medications as before  Please have the pharmacy call with any other refills you may need.  Please keep your appointments with your specialists as you may have planned  You will be contacted regarding the referral for: Podiatry  We can hold on lab testing today  Please see your PCP next week if not improving

## 2024-12-31 ENCOUNTER — Encounter: Payer: Self-pay | Admitting: Family Medicine

## 2025-01-05 ENCOUNTER — Ambulatory Visit: Payer: MEDICAID | Admitting: Family Medicine

## 2025-01-08 ENCOUNTER — Ambulatory Visit: Payer: MEDICAID | Admitting: Family Medicine
